# Patient Record
Sex: Male | Born: 1935
Health system: Southern US, Community
[De-identification: ages and names within clinical notes are randomized; demographics above are authoritative.]

## PROBLEM LIST (undated history)

## (undated) DIAGNOSIS — M1990 Unspecified osteoarthritis, unspecified site: Secondary | ICD-10-CM

## (undated) DIAGNOSIS — J189 Pneumonia, unspecified organism: Secondary | ICD-10-CM

## (undated) DIAGNOSIS — C801 Malignant (primary) neoplasm, unspecified: Secondary | ICD-10-CM

## (undated) DIAGNOSIS — Z87442 Personal history of urinary calculi: Secondary | ICD-10-CM

## (undated) DIAGNOSIS — I10 Essential (primary) hypertension: Secondary | ICD-10-CM

## (undated) DIAGNOSIS — Z860100 Personal history of colon polyps, unspecified: Secondary | ICD-10-CM

## (undated) HISTORY — PX: HERNIA REPAIR: SHX51

## (undated) HISTORY — PX: CATARACT EXTRACTION: SUR2

## (undated) HISTORY — DX: Essential (primary) hypertension: I10

## (undated) HISTORY — PX: COLONOSCOPY: SHX174

## (undated) HISTORY — PX: ROTATOR CUFF REPAIR: SHX139

## (undated) HISTORY — DX: Malignant (primary) neoplasm, unspecified: C80.1

## (undated) HISTORY — PX: PATELLA FRACTURE SURGERY: SHX735

## (undated) HISTORY — PX: EYE SURGERY: SHX253

## (undated) HISTORY — DX: Personal history of colon polyps, unspecified: Z86.0100

## (undated) HISTORY — DX: Personal history of colonic polyps: Z86.010

## (undated) NOTE — *Deleted (*Deleted)
HEMATOLOGY/ONCOLOGY CLINIC NOTE  Date of Service: 02/24/2020  Patient Care Team: Ronnald Nian, MD as PCP - General (Family Medicine)  CHIEF COMPLAINTS/PURPOSE OF CONSULTATION:  Diffuse Large B-Cell Lymphoma  HISTORY OF PRESENTING ILLNESS:   Mario Garcia is a wonderful 74 y.o. male who has been referred to [place] by Dr. Sharlot Gowda for evaluation and management of Diffuse Large B-Cell Lymphoma. The pt reports that he is doing well overall.   The pt reports that he feels that he has been a little more tired recently. He first noticed some "nodules" on his neck appear "4-6 weeks ago." He notes that these nodules haven't been painful. He appears to have presented to care with his PCP on 05/28/18, Dr. Susann Givens. He notes that he has noticed that he has been sweating more in the last year. He denies noticing any other new lumps or bumps. He denies any fevers, chills, drenching night sweats, or unexpected weight loss. He notes that he has had some changes in swallowing over the last 6 months, which he characterizes as "getting strangled on his saliva every now and then." He denies abdominal pains, acid reflux, changes in bowel habits, leg swelling, skin rashes. He endorses a deep pain "every once in a while," in his left groin. He denies headaches. He endorses glaucoma and a history of cataracts. He sees Dr. Delrae Sawyers in ophthalmology. He denies any other concern in the last 6 months.  The pt notes that he lives independently with his wife and still is able to do all he wants to do. He walks on trails with his wife and notes that he can generally walk as far as he likes to.  The pt notes that he had prostate cancer in 2008 or 2009, s/p prostatectomy. He did not require RT nor systemic treatments. He denies lung, heart or kidney problems.  Of note prior to the patient's visit today, pt has had a CT Neck completed on 06/05/18 with results revealing Pathologic RIGHT-sided level II, III, IV, and V  lymphadenopathy, most consistent with metastatic carcinoma. An obvious primary source is not identified. Tissue sampling is warranted.  Most recent lab results (07/08/18) of CBC and CMP is as follows: all values are WNL except for PLT at 129k.  On review of systems, pt reports slightly more tired, neck nodules, some changes in swallowing, staying active, and denies fevers, chills, drenching night sweats, unexpected weight loss, abdominal pains, changes in bowel habits, skin rashes, leg swelling, CP, SOB, difficulty breathing, headaches, other lumps or bumps, skin lesions, mouth sores, pain along the spine, back pain, leg swelling, and any other symptoms.   On PMHx the pt reports localized Prostate cancer in 2008 s/p prostatectomy.  On Social Hx the pt reports that he quit smoking over 50 years ago. He notes that he consumes about 1 glass of wine every day, without concerns for excessive drinking. 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 He denies concern for chemical or radiation exposure. On Family Hx the pt reports wife with Stage IV Non Hodgkin's Lymphoma, paternal grandmother with leukemia in her 74s, father with unspecified abdominal cancer.   Interval History:  [name] returns today for management and evaluation of his Diffuse Large B-Cell Lymphoma. We are joined today by his wife.*** The patient's last visit with [place] was on 02/12/2020. The pt reports that he is doing well overall.  The pt reports ***  Of note since  the patient's last visit, pt has had *** completed on *** with results revealing ***.  Lab results today (02/24/20) of CBC w/diff and CMP is as follows: all values are WNL except for ***.  On review of systems, pt reports *** and denies ***and any other symptoms.   A&P: -Discussed pt labwork today, 02/24/20; *** -***   MEDICAL HISTORY:  Past Medical History:  Diagnosis Date  . Cancer (HCC)    PROSTATE  . History of colonic polyps    . History of kidney stones   . Hypertension   . Inguinal hernia 03/2002  . Pneumonia    "years ago"    SURGICAL HISTORY: Past Surgical History:  Procedure Laterality Date  . COLONOSCOPY    . DIRECT LARYNGOSCOPY N/A 07/08/2018   Procedure: DIRECT LARYNGOSCOPY;  Surgeon: Newman Pies, MD;  Location: [facility] OR;  Service: ENT;  Laterality: N/A;  . ESOPHAGOSCOPY N/A 07/08/2018   Procedure: ESOPHAGOSCOPY;  Surgeon: Newman Pies, MD;  Location: [facility] OR;  Service: ENT;  Laterality: N/A;  . FLEXIBLE BRONCHOSCOPY N/A 07/08/2018   Procedure: FLEXIBLE BRONCHOSCOPY;  Surgeon: Newman Pies, MD;  Location: [facility] OR;  Service: ENT;  Laterality: N/A;  . HERNIA REPAIR Left    inguinal hernia  . IR IMAGING GUIDED PORT INSERTION  07/30/2018  . MASS BIOPSY Right 07/08/2018   Procedure: RIGHT NECK MASS EXCISIONAL BIOPSY;  Surgeon: Newman Pies, MD;  Location: [facility] OR;  Service: ENT;  Laterality: Right;  . PATELLA FRACTURE SURGERY Left   . PROSTATE SURGERY  10/2005   RADIAL PROSTATECTOMY  . ROTATOR CUFF REPAIR Bilateral     SOCIAL HISTORY: Social History   Socioeconomic History  . Marital status: Married    Spouse name: Not on file  . Number of children: Not on file  . Years of education: Not on file  . Highest education level: Not on file  Occupational History  . Not on file  Tobacco Use  . Smoking status: Former Games developer  . Smokeless tobacco: Never Used  . Tobacco comment: stopped 60 years ago  Vaping Use  . Vaping Use: Never used  Substance and Sexual Activity  . Alcohol use: Yes    Alcohol/week: 6.0 standard drinks    Types: 6 Standard drinks or equivalent per week  . Drug use: No  . Sexual activity: Not Currently  Other Topics Concern  . Not on file  Social History Narrative  . Not on file   Social Determinants of Health   Financial Resource Strain:   . Difficulty of Paying Living Expenses: Not on file  Food Insecurity:   . Worried About Programme researcher, broadcasting/film/video in the Last Year: Not on file  . Ran Out of Food  in the Last Year: Not on file  Transportation Needs:   . Lack of Transportation (Medical): Not on file  . Lack of Transportation (Non-Medical): Not on file  Physical Activity:   . Days of Exercise per Week: Not on file  . Minutes of Exercise per Session: Not on file  Stress:   . Feeling of Stress : Not on file  Social Connections:   . Frequency of Communication with Friends and Family: Not on file  . Frequency of Social Gatherings with Friends and Family: Not on file  . Attends Religious Services: Not on file  . Active Member of Clubs or Organizations: Not on file  . Attends Banker Meetings: Not on file  . Marital Status: Not on file  Intimate  Partner Violence:   . Fear of Current or Ex-Partner: Not on file  . Emotionally Abused: Not on file  . Physically Abused: Not on file  . Sexually Abused: Not on file    FAMILY HISTORY: Family History  Problem Relation Age of Onset  . Cancer Father   . Cancer Brother     ALLERGIES:  is allergic to lisinopril and penicillins.  MEDICATIONS:  Current Outpatient Medications  Medication Sig Dispense Refill  . Carboxymethylcellulose Sodium (ARTIFICIAL TEARS OP) Place 1 drop into both eyes daily as needed (dry eyes).    Marland Kitchen latanoprost (XALATAN) 0.005 % ophthalmic solution Place 1 drop into both eyes at bedtime.    . lidocaine-prilocaine (EMLA) cream Apply to affected area once 30 g 3  . losartan-hydrochlorothiazide (HYZAAR) 50-12.5 MG tablet TAKE 1 TABLET BY MOUTH EVERY DAY 90 tablet 0  . pravastatin (PRAVACHOL) 40 MG tablet Take 1 tablet by mouth once daily 90 tablet 0   No current facility-administered medications for this visit.    REVIEW OF SYSTEMS:   A 10+ POINT REVIEW OF SYSTEMS WAS OBTAINED including neurology, dermatology, psychiatry, cardiac, respiratory, lymph, extremities, GI, GU, Musculoskeletal, constitutional, breasts, reproductive, HEENT.  All pertinent positives are noted in the HPI.  All others are negative.    PHYSICAL EXAMINATION: ECOG FS:1 - Symptomatic but completely ambulatory  There were no vitals filed for this visit. Wt Readings from Last 3 Encounters:  02/12/20 172 lb (78 kg)  12/11/19 169 lb 11.2 oz (77 kg)  09/11/19 172 lb 8 oz (78.2 kg)   There is no height or weight on file to calculate BMI.    *** GENERAL:alert, in no acute distress and comfortable SKIN: no acute rashes, no significant lesions EYES: conjunctiva are pink and non-injected, sclera anicteric OROPHARYNX: MMM, no exudates, no oropharyngeal erythema or ulceration NECK: supple, no JVD LYMPH:  no palpable lymphadenopathy in the cervical, axillary or inguinal regions ***Multiple, small cervical lymph nodes. LUNGS: clear to auscultation b/l with normal respiratory effort HEART: regular rate & rhythm ABDOMEN:  normoactive bowel sounds , non tender, not distended. No palpable hepatosplenomegaly.  Extremity: no pedal edema PSYCH: alert & oriented x 3 with fluent speech NEURO: no focal motor/sensory deficits  LABORATORY DATA:  I have reviewed the data as listed  . CBC Latest Ref Rng & Units 02/23/2020 02/12/2020 12/11/2019  WBC 4.0 - 10.5 K/uL 8.8 8.0 6.7  Hemoglobin 13.0 - 17.0 g/dL 40.9 81.1 91.4  Hematocrit 39 - 52 % 42.0 40.4 41.7  Platelets 150 - 400 K/uL 126(L) 122(L) 115(L)    . CMP Latest Ref Rng & Units 02/12/2020 12/11/2019 09/11/2019  Glucose 70 - 99 mg/dL 782(N) 81 88  BUN 8 - 23 mg/dL 14 15 15   Creatinine 0.61 - 1.24 mg/dL 5.62 1.30 8.65  Sodium 135 - 145 mmol/L 141 138 140  Potassium 3.5 - 5.1 mmol/L 3.7 3.9 3.5  Chloride 98 - 111 mmol/L 102 102 104  CO2 22 - 32 mmol/L 28 30 24   Calcium 8.9 - 10.3 mg/dL 7.8(I) 9.0 6.9(G)  Total Protein 6.5 - 8.1 g/dL 7.0 7.2 7.3  Total Bilirubin 0.3 - 1.2 mg/dL 0.5 0.6 0.6  Alkaline Phos 38 - 126 U/L 70 64 67  AST 15 - 41 U/L 18 20 25   ALT 0 - 44 U/L 12 17 18    . Lab Results  Component Value Date   LDH 169 02/12/2020   03/03/2019 NM PET Image Restag (PS)  Skull Base To Thigh (  Accession [id number]):   07/08/18 Right Cervical Tissue Biopsy:    RADIOGRAPHIC STUDIES: I have personally reviewed the radiological images as listed and agreed with the findings in the report. NM PET Image Restag (PS) Skull Base To Thigh  Result Date: 02/09/2020 CLINICAL DATA:  Subsequent treatment strategy for large B-cell lymphoma. EXAM: NUCLEAR MEDICINE PET SKULL BASE TO THIGH TECHNIQUE: 8.4 mCi F-18 FDG was injected intravenously. Full-ring PET imaging was performed from the skull base to thigh after the radiotracer. CT data was obtained and used for attenuation correction and anatomic localization. Fasting blood glucose: 99 mg/dl COMPARISON:  82/95/6213. FINDINGS: Mediastinal blood pool activity: SUV max 2.1 Liver activity: SUV max 3.6 NECK: Stable right neck lymph nodes with an index 8 mm level 2 node (4/28), SUV max 6.3. No new hypermetabolic lymph nodes. Incidental CT findings: None. CHEST: Bilateral axillary lymph nodes are stable in size but increased in metabolism. Index high left axillary lymph node measures 7 mm (4/46) with an SUV max of 7.3 compared to 3.2 previously. New hypermetabolism within supraclavicular lymph nodes. Index left supraclavicular lymph node measures 5 mm (4/43) with an SUV max of 4.8. Hypermetabolic internal mammary and mediastinal/hilar lymph nodes. Index prevascular lymph node measures 7 mm (4/63) with an SUV max of 3.0, compared to 1.9 previously. No hypermetabolic pulmonary nodules. Incidental CT findings: Left IJ Port-A-Cath terminates in the high right atrium. Atherosclerotic calcification of the aorta, aortic valve and coronary arteries. Heart is enlarged. No pericardial effusion. A few scattered subpleural pulmonary nodules measure up to 5 mm in the right lower lobe (8/48), unchanged and too small for PET resolution. No pleural effusion. ABDOMEN/PELVIS: Focal hypermetabolism is seen in the liver, adjacent to the gallbladder fossa, without a  definite CT correlate. Spleen is hypermetabolic, SUV max 9.5. No abnormal hypermetabolism in the adrenal glands or pancreas. Periportal lymph nodes measure up to 9 mm (4/109) with an SUV max 7.0, compared to 3.9 previously. Abdominal and pelvic retroperitoneal lymph nodes have increased in hypermetabolism. Index aortocaval lymph node measures 6 mm (4/124) with an SUV max of 7.4 previously 2.1. Index left external iliac lymph node measures 9 mm (4/173) with an SUV max of 8.4, compared to 6.2 previously. Finally, there are hypermetabolic mesenteric lymph nodes. Index ileocolic mesenteric lymph node measures 7 mm (4/142) with an SUV max of 5.9. Incidental CT findings: Liver, gallbladder, adrenal glands, kidneys, spleen, pancreas stomach and bowel are grossly unremarkable. Atherosclerotic calcification of the aorta without aneurysm. Right inguinal hernia contains unobstructed small bowel. SKELETON: No abnormal hypermetabolism. Incidental CT findings: Degenerative changes in the spine. IMPRESSION: 1. Increasingly hypermetabolic lymph nodes in the neck, chest, abdomen and pelvis and newly hypermetabolic spleen findings consistent with worsening lymphoma. 2. Focal hypermetabolism in segment 4 of the liver without a definite CT correlate. Recommend attention on follow-up. 3. Large right inguinal hernia contains unobstructed small bowel. 4. Aortic atherosclerosis (ICD10-I70.0). Coronary artery calcification. Electronically Signed   By: Leanna Battles M.D.   On: 02/09/2020 09:03   [place] CORE BIOPSY (LYMPH NODES)  Result Date: 02/23/2020 INDICATION: History of diffuse large B-cell lymphoma now with hypermetabolic adenopathy. Please perform ultrasound-guided biopsy of hypermetabolic right cervical lymph node for tissue diagnostic purposes. EXAM: ULTRASOUND-GUIDED RIGHT CERVICAL LYMPH NODE BIOPSY COMPARISON:  PET-CT-02/09/2020 MEDICATIONS: None ANESTHESIA/SEDATION: Moderate (conscious) sedation was employed during this  procedure. A total of Versed 1.5 mg and Fentanyl 20 mcg was administered intravenously. Moderate Sedation Time: 10 minutes. The patient's level of consciousness and vital signs were monitored  continuously by radiology nursing throughout the procedure under my direct supervision. COMPLICATIONS: None immediate. TECHNIQUE: Informed written consent was obtained from the patient after a discussion of the risks, benefits and alternatives to treatment. Questions regarding the procedure were encouraged and answered. Initial ultrasound scanning demonstrated an approximately 1.1 x 0.7 cm right lateral cervical lymph node, likely correlating with the hypermetabolic cervical lymph node seen on preceding PET-CT image 30, series 4. An ultrasound image was saved for documentation purposes. The procedure was planned. A timeout was performed prior to the initiation of the procedure. The operative was prepped and draped in the usual sterile fashion, and a sterile drape was applied covering the operative field. A timeout was performed prior to the initiation of the procedure. Local anesthesia was provided with 1% lidocaine with epinephrine. Under direct ultrasound guidance, an 18 gauge core needle device was utilized to obtain to obtain 6 core needle biopsies of the hypermetabolic right lateral cervical lymph node. The samples were placed in saline and submitted to pathology. The needle was removed and hemostasis was achieved with manual compression. Post procedure scan was negative for significant hematoma. A dressing was placed. The patient tolerated the procedure well without immediate postprocedural complication. IMPRESSION: Technically successful ultrasound guided biopsy of hypermetabolic right lateral cervical lymph node. Electronically Signed   By: Simonne Come M.D.   On: 02/23/2020 14:59    ASSESSMENT & PLAN:   18 y.o. male with  1. Diffuse Large B-Cell Lymphoma, Stage IV Presenting without constitutional symptoms.  Palpable right cervical and supraclavicular lymphadenopathy.   07/08/18 Right cervical soft tissue biopsy revealed Diffuse Large B-Cell Lymphoma, germinal center type   06/05/18 CT Neck revealed Pathologic RIGHT-sided level II, III, IV, and V lymphadenopathy, most consistent with metastatic carcinoma. An obvious primary source is not identified. Tissue sampling is warranted.  07/16/18 Hep B and Hep C negative  07/17/18 ECHO revealed LV EF of 60-65%  07/22/18 PET/CT revealed "Hypermetabolic adenopathy especially concentrated in the right neck but also in the left neck, chest, abdomen, and pelvis. The adenopathy is primarily Deauville 5 although some few scattered smaller lesions are Deauville 4. 2. Diffuse abnormal splenic activity, Deauville 5, without overt splenomegaly. 3. There is also hypermetabolic Deauville 5 activity in the mildly thickened distal appendix, and raising suspicion for involvement of the appendix. 4. Other imaging findings of potential clinical significance: Aortic Atherosclerosis. Coronary atherosclerosis."  10/03/2018 PET scan revealed "Interval response to therapy with stable to decreased size of lymph nodes on CT and generalized decrease in hypermetabolism of the abnormal nodes. Hypermetabolism in the lymph nodes today is compatible with a combination of Deauville 3 and Deauville 4 disease. Stable tiny bilateral pulmonary nodules. Increase radiotracer accumulation in the marrow space on today's study, presumably representing marrow stimulatory effects of therapy."   02/09/2020 PET/CT ([id number]) revealed "1. Increasingly hypermetabolic lymph nodes in the neck, chest, abdomen and pelvis and newly hypermetabolic spleen findings consistent with worsening lymphoma."   PLAN: *** -Advised pt that we would not expect symptoms to be caused by lymphoma based on apparent burden of disease.  -Advised pt that if lymphadenopathy is caused by a reactive process it would not be a  contraindication to a hernia repair surgery.    FOLLOW UP: ***   The total time spent in the appt was *** minutes and more than 50% was on counseling and direct patient cares.  All of the patient's questions were answered with apparent satisfaction. The patient knows to call the clinic with  any problems, questions or concerns.   Wyvonnia Lora MD MS AAHIVMS [facility] Hematology/Oncology Physician [facility]  (Office):       [phone] (Work cell):  [phone] (Fax):           [phone]  02/24/2020 1:42 PM  I, Carollee Herter, am acting as a scribe for Dr. Wyvonnia Lora.   {Add Production assistant, radio Statement}

---

## 1999-01-25 ENCOUNTER — Encounter: Admission: RE | Admit: 1999-01-25 | Discharge: 1999-01-25 | Payer: Self-pay | Admitting: Orthopaedic Surgery

## 1999-01-28 ENCOUNTER — Ambulatory Visit (HOSPITAL_BASED_OUTPATIENT_CLINIC_OR_DEPARTMENT_OTHER): Admission: RE | Admit: 1999-01-28 | Discharge: 1999-01-28 | Payer: Self-pay | Admitting: Orthopaedic Surgery

## 2000-08-26 ENCOUNTER — Encounter: Payer: Self-pay | Admitting: Emergency Medicine

## 2000-08-26 ENCOUNTER — Emergency Department (HOSPITAL_COMMUNITY): Admission: EM | Admit: 2000-08-26 | Discharge: 2000-08-26 | Payer: Self-pay | Admitting: Emergency Medicine

## 2002-04-14 ENCOUNTER — Encounter: Payer: Self-pay | Admitting: Family Medicine

## 2002-04-14 ENCOUNTER — Encounter: Admission: RE | Admit: 2002-04-14 | Discharge: 2002-04-14 | Payer: Self-pay | Admitting: Family Medicine

## 2002-05-14 ENCOUNTER — Encounter: Payer: Self-pay | Admitting: General Surgery

## 2002-05-14 ENCOUNTER — Encounter: Admission: RE | Admit: 2002-05-14 | Discharge: 2002-05-14 | Payer: Self-pay | Admitting: General Surgery

## 2004-12-11 ENCOUNTER — Encounter: Admission: RE | Admit: 2004-12-11 | Discharge: 2004-12-11 | Payer: Self-pay | Admitting: Family Medicine

## 2005-08-17 ENCOUNTER — Encounter: Admission: RE | Admit: 2005-08-17 | Discharge: 2005-08-17 | Payer: Self-pay | Admitting: Family Medicine

## 2005-09-21 LAB — HM COLONOSCOPY

## 2005-10-04 ENCOUNTER — Ambulatory Visit: Payer: Self-pay | Admitting: Family Medicine

## 2005-10-25 HISTORY — PX: PROSTATE SURGERY: SHX751

## 2005-11-23 ENCOUNTER — Encounter (INDEPENDENT_AMBULATORY_CARE_PROVIDER_SITE_OTHER): Payer: Self-pay | Admitting: Specialist

## 2005-11-23 ENCOUNTER — Inpatient Hospital Stay (HOSPITAL_COMMUNITY): Admission: RE | Admit: 2005-11-23 | Discharge: 2005-11-24 | Payer: Self-pay | Admitting: Urology

## 2005-12-01 ENCOUNTER — Emergency Department (HOSPITAL_COMMUNITY): Admission: EM | Admit: 2005-12-01 | Discharge: 2005-12-01 | Payer: Self-pay | Admitting: Cardiology

## 2005-12-27 ENCOUNTER — Ambulatory Visit: Payer: Self-pay | Admitting: Family Medicine

## 2006-04-18 ENCOUNTER — Ambulatory Visit: Payer: Self-pay | Admitting: Family Medicine

## 2006-10-04 ENCOUNTER — Ambulatory Visit: Payer: Self-pay | Admitting: Family Medicine

## 2007-09-30 ENCOUNTER — Ambulatory Visit: Payer: Self-pay | Admitting: Family Medicine

## 2008-04-16 ENCOUNTER — Ambulatory Visit: Payer: Self-pay | Admitting: Family Medicine

## 2008-06-23 ENCOUNTER — Ambulatory Visit: Payer: Self-pay | Admitting: Family Medicine

## 2009-12-22 ENCOUNTER — Ambulatory Visit: Payer: Self-pay | Admitting: Family Medicine

## 2010-01-21 ENCOUNTER — Ambulatory Visit: Payer: Self-pay | Admitting: Family Medicine

## 2010-08-12 NOTE — Consult Note (Signed)
NAME:  Mario Garcia, Mario Garcia NO.:  [id number]   MEDICAL RECORD NO.:  [id number]          PATIENT TYPE:  EMS   LOCATION:  ED                           FACILITY:  [facility]   PHYSICIAN:  Sigmund I. Patsi Sears, M.D.DATE OF BIRTH:  [birth year]   DATE OF CONSULTATION:  12/01/2005  DATE OF DISCHARGE:                                   CONSULTATION   SUBJECTIVE:  This is a 77 year old male, who is 10 days status post robotic  radical prostatectomy for adenocarcinoma of the prostate.  The patient had  Foley catheter removed yesterday in the office, and voided one time normally  after that.  However, the patient began having bloody urine last night and  decrease in urine ability.  By early this morning, the patient was in  urinary retention, was seen in the emergency room, where a Foley catheter  was passed without difficulty, and grossly bloody urine identified.  He had  urinalysis which showed occasional white blood cell, and too numerous to  count red blood cells.  PMH: significant for recemt robotic prostate surgery.  ROS: Freqency, urgency, gross hematuris, suprapubic pain, with abominal  cramping. Remaining ROS is neg.  Social: Tobacco: neg. Etoh: neg.   MEDICATIONS:  Antibiotic (unknown).   PHYSICAL EXAMINATION:  Today, shows a well-developed, well-nourished white  male in no acute distress.  VITAL SIGNS:  Stable.  CHEST:  Clear to P&A.  ABDOMEN:  Soft, positive bowel sounds without organomegaly or masses.  There  is no flank pain.  There is no CVA pain.  The Foley catheter is in good  position.  PENIS:  Normal.  URETHRA:  Normal.  GLANS:  Normal.  TESTICLES:  Measure 4 x 4 cm and nontender.  ABDOMINAL INCISIONS:  Wound healing nicely.  Foley catheter is in position,  and there is grossly bloody urine, with occasional clot.  The urine in the  tube appears to be clearing.  EXTREMITIES:  No cyanosis or edema.  There is negative Homan's sign.   IMPRESSION:  Postoperative  urinary clot retention.  I have advised the  patient to keep the Foley catheter for the weekend, and he will see Dr.  Laverle Patter on Monday for voiding trial.  I have reassured him.  We will keep him  on Cipro for the weekend.   PLAN:  1. Write Cipro 500 mg b.i.d. for 3 days.  2. Foley catheter will remain in place with leg bag and Foley catheter.  3. Return to clinic Monday to see Dr. Laverle Patter.      Sigmund I. Patsi Sears, M.D.  Electronically Signed     SIT/MEDQ  D:  12/01/2005  T:  12/01/2005  Job:  [id number]   cc:   Heloise Purpura, MD  Fax: [phone]

## 2010-08-12 NOTE — Discharge Summary (Signed)
NAME:  Mario Garcia, Mario Garcia NO.:  [id number]   MEDICAL RECORD NO.:  [id number]          PATIENT TYPE:  INP   LOCATION:  1418                         FACILITY:  [facility]   PHYSICIAN:  Heloise Purpura, MD      DATE OF BIRTH:  1931/06/07   DATE OF ADMISSION:  11/23/2005  DATE OF DISCHARGE:  11/24/2005                                 DISCHARGE SUMMARY   ADMISSION DIAGNOSIS:  Prostate cancer.   DISCHARGE DIAGNOSIS:  Prostate cancer.   PROCEDURES:  1. Robotic-assisted laparoscopic radical prostatectomy.  2. Bilateral pelvic lymphadenectomy.   HISTORY AND PHYSICAL:  For full details, please see admission history  physical.  Briefly, [name] is a 77 year old gentleman with clinical  stage T1c prostate cancer with a PSA of 11.3 and Gleason score 3 + 3 = 6.  After discussion regarding management options for clinically localized  prostate cancer, the patient elected to proceed with surgical therapy with  the above procedure.   HOSPITAL COURSE:  On November 23, 2005, the patient was taken to the operating  room and a robotic-assisted laparoscopic radical prostatectomy was performed  with a bilateral pelvic lymphadenectomy.  The patient tolerated the  procedure well without complications.  Postoperatively, he was able to be  transferred to a regular hospital room following recovery from anesthesia.  He was able to begin ambulating that evening.  On postoperative day #1, he was doing well and was able to begin a clear  liquid diet.  He was noted to be mildly hypotensive with a systolic blood  pressure in the 90s.  In addition, his hemoglobin did drop from  approximately 14 to 10.8.  He was monitored closely and a repeat hemoglobin  was checked and it was 11.  In addition, his blood pressure subsequently  improved with systolic pressures in the 140s to 150s.  He maintained  excellent urine output throughout postoperative day #1 with minimal output  from his pelvic drain.  His  pelvic drain was therefore able to be removed.  He tolerated clear liquids without difficulty and was able to be  transitioned to oral pain medication.  By the afternoon of postoperative day  #1, he was doing well and had met all discharge criteria and was able to be  discharged home in excellent condition.   DISPOSITION:  Home.   DISCHARGE MEDICATIONS:  The patient was instructed to resume his regular  home medications excepting any aspirin, nonsteroidal anti-inflammatory  drugs, or herbal supplements.  He was given a prescription to take Vicodin  as needed for pain, Colace as a stool softener, and told to begin Cipro 1  day prior to his return visit for Foley catheter removal.   DISCHARGE INSTRUCTIONS:  1. The patient was instructed to be ambulatory but specifically told to      refrain from any heavy lifting, strenuous activity, or driving.  2. He was told to gradually advance his diet as tolerated once passing      flatus.  3. He was instructed on routine Foley catheter care and given a leg bag  for daytime usage.   FOLLOW UP:  Mr. Hernan will follow up in 1 week for removal of his Foley  catheter and to review his surgical pathology in detail.           ______________________________  Heloise Purpura, MD  Electronically Signed     LB/MEDQ  D:  11/24/2005  T:  11/25/2005  Job:  [id number]   cc:   Sharlot Gowda, M.D.  Fax: [phone]

## 2010-08-12 NOTE — Op Note (Signed)
NAME:  ARRION, BROADDUS NO.:  [id number]   MEDICAL RECORD NO.:  [id number]          PATIENT TYPE:  INP   LOCATION:  0001                         FACILITY:  [facility]   PHYSICIAN:  Heloise Purpura, MD      DATE OF BIRTH:  1931/07/28   DATE OF PROCEDURE:  11/23/2005  DATE OF DISCHARGE:                                 OPERATIVE REPORT   PREOPERATIVE DIAGNOSIS:  Clinically localized adenocarcinoma of the  prostate.   POSTOPERATIVE DIAGNOSIS:  Clinically localized adenocarcinoma of the  prostate.   PROCEDURES.:  1. Robotic assisted laparoscopic radical prostatectomy (bilateral nerve      sparing).  2. Bilateral laparoscopic pelvic lymphadenectomy.   SURGEON:  Dr. Heloise Purpura.   ASSISTANT:  Dr. [place] Service.   ANESTHESIA:  General.   COMPLICATIONS:  None.   ESTIMATED BLOOD LOSS:  200 mL.   INTRAVENOUS FLUIDS:  1600 mL of lactated Ringer's.   SPECIMENS:  1. Prostate and seminal vesicles.  2. Right pelvic lymph nodes.  3. Left pelvic lymph nodes.   DRAINS:  1. 20-French straight catheter.  2. #19 Blake pelvic drain.   INDICATIONS:  Mr. Carrero is a 77 year old gentleman with clinical stage T1C  prostate cancer, with a PSA of 11.3 and Gleason score 3 + 3 = 6.  After  discussing management options, including active surveillance, the patient  elected to proceed with surgical therapy.  He underwent a preoperative  evaluation and was felt to be in excellent health at this time.  Potential  risks and benefits of the procedure were discussed with the patient and he  consented.   DESCRIPTION OF PROCEDURE:  The patient was taken to the operating room and  general anesthetic was administered.  He was given preoperative antibiotics,  placed in the dorsal lithotomy position, and prepped and draped in the usual  sterile fashion.  Next, preoperative time-out was performed.  A Foley  catheter was then inserted into the bladder.  A site was selected 18 cm from  the  pubic symphysis and just to the left of the umbilicus for placement of  the camera port.  This was placed using a standard open Hasson technique.  This allowed entry into the peritoneal cavity under direct vision.  A 12-mm  port was then placed and pneumoperitoneum was established.  With the 0-  degree lens, the abdomen was inspected and there was no evidence of any  intra-abdominal injuries or other abnormalities.  The remaining ports were  then placed.  Bilateral 8-mm robotic ports were placed 10 cm lateral to the  camera port and 16 cm from the pubic symphysis.  An additional 8-mm port was  placed in the far left lateral abdominal wall.  A 5-mm port was placed  between the camera port and the right robotic port.  An additional 12-mm  port was placed in the far right lateral abdominal wall for laparoscopic  assistance.  All ports were placed under direct vision and without  difficulty.  The surgical cart was then docked.  With the aid of the cautery  scissors,  the bladder was reflected posteriorly, allowing entrance into the  space of Retzius and identification of the endopelvic fascia and prostate.  The endopelvic fascia was then incised from the apex back to the base of the  prostate bilaterally, and the underlying levator muscle fibers were swept  laterally off the prostate.  This isolated the dorsal venous complex which  was then stapled and divided with a 45-mm flex ETS stapler.  Attention then  turned to the bladder neck, which was identified with the aid of Foley  catheter manipulation.  The bladder neck was then incised anteriorly,  allowing exposure of the Foley catheter.  The Foley catheter balloon was  deflated and the catheter was brought into the operative field and used to  retract the prostate anteriorly.  This helped to expose the posterior  bladder neck which was then divided.  Dissection continued posteriorly until  the vasa differentia and seminal vesicles were  identified.  The vasa  differentia were isolated and divided.  The seminal vesicles were similarly  isolated with care to control the seminal vesicle arterial blood supply.  Seminal vesicles were then also lifted anteriorly.  The space between  Denonvilliers' fascia and the anterior rectum was then bluntly developed,  thereby isolating the vascular pedicles of the prostate.  Attention then  turned to the anterior aspect of the prostate.  The lateral prostatic fascia  was incised bilaterally, allowing the neurovascular bundles to be swept  laterally and posteriorly off the prostate.  The vascular pedicles of the  prostate were then ligated with Hem-o-lok above the level of the  neurovascular bundles, and sharply divided with cold dissection.  The  urethra was then identified and sharply divided.  This allowed the prostate  specimen to be disarticulated and placed up into the abdomen for later  removal.  The pelvis was then copiously irrigated and hemostasis was  ensured.  There was noted to be a small area of oozing from the left  neurovascular bundle.  A small piece of Surgicel was placed over this area.  Attention then turned to the right pelvic sidewall.  The fibrofatty tissue  between the external iliac vein, confluence of the iliac vessels, obturator  nerve, and Cooper's ligament was dissected free from the pelvic sidewall.  Hem-o-lok clips were used for lymphostasis and hemostasis.  The specimen was  then passed off for permanent pathologic analysis.  An identical procedure  was then performed on contralateral side.  Attention then returned to the  pelvis.  With irrigation in the pelvis, air was injected into the rectal  catheter and there was no evidence of a rectal injury.  Attention then  turned to the urethral anastomosis.  A double-armed, 3-0 Monocryl suture was  used to perform a 360 degrees running tension-free anastomosis between the bladder neck and urethra.  A new 20-French  Coude catheter was then inserted  into the bladder.  This catheter was irrigated and there were no blood clots  within the catheter.  In addition, the catheter was irrigated and the  anastomosis appeared to be watertight.  There was still noted be a small  amount of bleeding from the left neurovascular bundle.  Therefore, a figure-  of-eight 0 Vicryl suture was used to provide hemostasis.  This did appear to  result in adequate hemostasis.  A #19 Blake drain was then brought to the  left robotic port and appropriately positioned in the pelvis.  It was  secured to the skin with a  nylon suture.  The surgical cart was then  undocked.  The Endopouch retrieval bag was then used to retrieve the  prostate specimen.  A 0 Vicryl stitch was used to close the right lateral 12-  mm port site with the aid of the suture passer device.  All remaining ports  were then removed under direct vision.  The prostate specimen then was  removed intact, within the Endopouch retrieval bag, via the periumbilical  port site.  This fascial opening was then closed with a running 0 Vicryl  suture.  All port sites  were injected with 0.25% Marcaine and reapproximated at the skin level with  staples.  The patient appeared to tolerate the procedure well and without  complications.  He was able to be extubated and transferred to recovery unit  in satisfactory condition.           ______________________________  Heloise Purpura, MD  Electronically Signed     LB/MEDQ  D:  11/23/2005  T:  11/24/2005  Job:  [id number]

## 2010-10-04 ENCOUNTER — Ambulatory Visit (INDEPENDENT_AMBULATORY_CARE_PROVIDER_SITE_OTHER): Payer: Medicare Other | Admitting: Medical

## 2010-10-04 ENCOUNTER — Encounter: Payer: Self-pay | Admitting: Medical

## 2010-10-04 ENCOUNTER — Encounter: Payer: Self-pay | Admitting: Family Medicine

## 2010-10-04 VITALS — BP 132/78 | HR 60 | Temp 98.6°F | Ht 70.0 in | Wt 162.0 lb

## 2010-10-04 DIAGNOSIS — R05 Cough: Secondary | ICD-10-CM

## 2010-10-04 DIAGNOSIS — I10 Essential (primary) hypertension: Secondary | ICD-10-CM

## 2010-10-04 DIAGNOSIS — R0982 Postnasal drip: Secondary | ICD-10-CM

## 2010-10-04 MED ORDER — LISINOPRIL-HYDROCHLOROTHIAZIDE 10-12.5 MG PO TABS: 1.0000 | ORAL_TABLET | Freq: Every day | ORAL | Status: DC

## 2010-10-04 MED ORDER — HYDROCODONE-HOMATROPINE 5-1.5 MG/5ML PO SYRP: 5.0000 mL | ORAL_SOLUTION | Freq: Every evening | ORAL | Status: AC | PRN

## 2010-10-04 NOTE — Progress Notes (Signed)
Addended by: Dorthula Perfect on: 10/04/2010 02:21 PM   Modules accepted: Orders

## 2010-10-04 NOTE — Progress Notes (Signed)
  Subjective:     Mario Garcia is a 77 y.o. male who presents for evaluation of cough that only bothers him if supine or reclined in the evening.  Sometimes the cough keeps him up at night.  He thinks its related to post nasal drip.  He denies feeling ill, no sick contacts, no reflux symptoms, otherwise in normal state of health. He wants to make sure its not related to meds or something else.  Cough doesn't bother him during the day, and he has been tobacco free for 50+ years.  No other aggravating or relieving factors.  No other c/o.  The following portions of the patient's history were reviewed and updated as appropriate: allergies, current medications, past family history, past medical history, past social history, past surgical history and problem list.  Review of Systems Constitutional: denies fever, chills, sweats, anorexia Skin: denies rash HEENT: denies ear pain, sore throat, itchy watery eyes Cardiovascular: denies chest pain Lungs: denies wheezing, SOB Abdomen: denies abdominal pain, nausea, vomiting, diarrhea GU: denies dysuria  Objective:   Filed Vitals:   10/04/10 1037  BP: 132/78  Pulse: 60  Temp: 98.6 F (37 C)    General appearance: Alert, WD/WN, no distress                             Skin: warm, no rash                           Head: no sinus tenderness                            Eyes: conjunctiva normal, corneas clear, PERRLA                            Ears: pearly TMs, external ear canals normal                          Nose: septum midline, turbinates swollen with clear nasal discharge             Mouth/throat: MMM, tongue normal, +post nasal drip, but no pharyngeal erythema                           Neck: supple, no adenopathy, no thyromegaly, nontender                          Heart: RRR, normal S1, S2, no murmurs                         Lungs: CTA bilaterally, no wheezes, rales, or rhonchi     Assessment:   Encounter Diagnoses  Name Primary?  .  Post-nasal drip Yes  . Cough   . Essential hypertension, benign      Plan:   Advised he begin over-the-counter Zyrtec each bedtime, Hydromet for cough prn, hydrate well, call if not improved in 1-2 wk.  HTN - refilled medication.

## 2010-12-15 ENCOUNTER — Telehealth: Payer: Self-pay | Admitting: Family Medicine

## 2010-12-15 MED ORDER — PRAVASTATIN SODIUM 40 MG PO TABS: 40.0000 mg | ORAL_TABLET | Freq: Every day | ORAL | Status: DC

## 2010-12-15 NOTE — Telephone Encounter (Signed)
He needs a med check appointment set up. Don't let them run out of his statin drug. If he needs more cough medication, he will need to be seen for that also

## 2010-12-15 NOTE — Telephone Encounter (Signed)
Left pt message he needs a med check appt

## 2010-12-21 ENCOUNTER — Ambulatory Visit (INDEPENDENT_AMBULATORY_CARE_PROVIDER_SITE_OTHER): Payer: Medicare Other | Admitting: Family Medicine

## 2010-12-21 ENCOUNTER — Encounter: Payer: Self-pay | Admitting: Family Medicine

## 2010-12-21 VITALS — BP 140/72 | HR 60 | Ht 70.5 in | Wt 165.0 lb

## 2010-12-21 DIAGNOSIS — Z79899 Other long term (current) drug therapy: Secondary | ICD-10-CM

## 2010-12-21 DIAGNOSIS — Z23 Encounter for immunization: Secondary | ICD-10-CM

## 2010-12-21 DIAGNOSIS — Z8546 Personal history of malignant neoplasm of prostate: Secondary | ICD-10-CM | POA: Insufficient documentation

## 2010-12-21 DIAGNOSIS — E785 Hyperlipidemia, unspecified: Secondary | ICD-10-CM | POA: Insufficient documentation

## 2010-12-21 DIAGNOSIS — K219 Gastro-esophageal reflux disease without esophagitis: Secondary | ICD-10-CM

## 2010-12-21 DIAGNOSIS — I10 Essential (primary) hypertension: Secondary | ICD-10-CM

## 2010-12-21 LAB — COMPREHENSIVE METABOLIC PANEL
ALT: 16 U/L (ref 0–53)
AST: 22 U/L (ref 0–37)
Albumin: 4.4 g/dL (ref 3.5–5.2)
Alkaline Phosphatase: 57 U/L (ref 39–117)
Potassium: 4.3 mEq/L (ref 3.5–5.3)
Sodium: 138 mEq/L (ref 135–145)
Total Protein: 7.5 g/dL (ref 6.0–8.3)

## 2010-12-21 LAB — CBC WITH DIFFERENTIAL/PLATELET
Basophils Relative: 0 % (ref 0–1)
HCT: 45.8 % (ref 39.0–52.0)
Hemoglobin: 15.1 g/dL (ref 13.0–17.0)
Lymphocytes Relative: 40 % (ref 12–46)
Lymphs Abs: 2.2 10*3/uL (ref 0.7–4.0)
MCHC: 33 g/dL (ref 30.0–36.0)
Monocytes Absolute: 0.6 10*3/uL (ref 0.1–1.0)
Monocytes Relative: 11 % (ref 3–12)
Neutro Abs: 2.7 10*3/uL (ref 1.7–7.7)
RBC: 5.15 MIL/uL (ref 4.22–5.81)

## 2010-12-21 LAB — LIPID PANEL
HDL: 41 mg/dL (ref >39)
LDL Cholesterol: 115 mg/dL — ABNORMAL HIGH (ref 0–99)

## 2010-12-21 NOTE — Progress Notes (Signed)
  Subjective:    Patient ID: Mario Garcia, male    DOB: 01-02-1932, 77 y.o.   MRN: [id number]  HPI He is here for an interval evaluation. He continues on medications listed in the chart. He is having no difficulty with them. He gets routine followup concerning his prostate cancer and apparently his most recent PSA was undetectable. He has noted difficulty over the last several months with a cough it tends to occur when he lies down. He has tried an antihistamine for it with minimal results. He does not complain of reflux type symptoms. He has no particular concerns or complaints other than as above. He is getting ready did spend a month in [facility].   Review of Systems     Objective:   Physical Exam alert and in no distress. Tympanic membranes and canals are normal. Throat is clear. Tonsils are normal. Neck is supple without adenopathy or thyromegaly. Cardiac exam shows a regular sinus rhythm without murmurs or gallops. Lungs are clear to auscultation.       Assessment & Plan:   1. GERD (gastroesophageal reflux disease)    2. Hypertension  CBC with Differential, Comprehensive metabolic panel, Lipid panel  3. Hyperlipidemia LDL goal < 100  Lipid panel  4. History of prostate cancer    5. Encounter for long-term (current) use of other medications  CBC with Differential, Comprehensive metabolic panel, Lipid panel   He will try Pepcid or Zantac and double it if no response. He is then to call me. His medications will be renewed pending lab results

## 2010-12-21 NOTE — Patient Instructions (Signed)
Uses Zantac or Pepcid for your coughing. If that doesn't work double it. If it still doesn't work, call me.

## 2010-12-22 ENCOUNTER — Telehealth: Payer: Self-pay

## 2010-12-22 NOTE — Telephone Encounter (Signed)
Pt informed labs look good and to continue on present med

## 2011-01-31 ENCOUNTER — Telehealth: Payer: Self-pay | Admitting: Family Medicine

## 2011-01-31 MED ORDER — PRAVASTATIN SODIUM 40 MG PO TABS: 40.0000 mg | ORAL_TABLET | Freq: Every day | ORAL | Status: DC

## 2011-01-31 NOTE — Telephone Encounter (Signed)
Pravachol sent to new pharmacy

## 2011-02-23 ENCOUNTER — Other Ambulatory Visit: Payer: Self-pay | Admitting: Endodontics

## 2011-10-17 ENCOUNTER — Ambulatory Visit (INDEPENDENT_AMBULATORY_CARE_PROVIDER_SITE_OTHER): Payer: Medicare Other | Admitting: Family Medicine

## 2011-10-17 ENCOUNTER — Encounter: Payer: Self-pay | Admitting: Family Medicine

## 2011-10-17 VITALS — BP 124/80 | HR 65 | Wt 173.0 lb

## 2011-10-17 DIAGNOSIS — K219 Gastro-esophageal reflux disease without esophagitis: Secondary | ICD-10-CM

## 2011-10-17 NOTE — Patient Instructions (Addendum)
Take2 Prilosec per day and you might want take it at night. You can also hold the lisinopril for week or 2. If the cough goes away then add the lisinopril back but if it returns let me know

## 2011-10-17 NOTE — Progress Notes (Signed)
  Subjective:    Patient ID: Mario Garcia, male    DOB: [birth year], 78 y.o.   MRN: [id number]  HPI He is still having difficulty with coughing he notices especially at night and when he lies down. He is concerned over this being related to his lisinopril. He did stop for one or 2 days and notes a questionable improvement. He is also been using an H2 blocker to help with cough and again this has been minimally successful.  Review of Systems     Objective:   Physical Exam Alert and in no distress. Lungs are clear to auscultation cardiac exam shows regular rhythm without murmurs or gallops.       Assessment & Plan:   1. GERD (gastroesophageal reflux disease)   Take2 Prilosec per day and you might want take it at night. You can also hold the lisinopril for week or 2. If the cough goes away then add the lisinopril back but if it returns let me know

## 2011-10-30 ENCOUNTER — Telehealth: Payer: Self-pay | Admitting: Family Medicine

## 2011-10-30 NOTE — Telephone Encounter (Signed)
There is a question as to exactly what causing his cough. I will have him stop lisinopril and Prilosec for a week. If his cough recurs, he is to take 2 Prilosec per day and then call me.

## 2011-11-07 ENCOUNTER — Telehealth: Payer: Self-pay | Admitting: Internal Medicine

## 2011-11-07 NOTE — Telephone Encounter (Signed)
He is to start taking Prilosec 40 mg for the next week and then call me. If his coughing is gone completely I might try him on lisinopril again however if the cough recurs, I will switch him to an ARB.

## 2011-11-14 ENCOUNTER — Telehealth: Payer: Self-pay | Admitting: Internal Medicine

## 2011-11-14 NOTE — Telephone Encounter (Signed)
Pt states that his cough is almost at 100% better and that he will start on his lisinopril tomorrow

## 2011-11-29 ENCOUNTER — Ambulatory Visit (INDEPENDENT_AMBULATORY_CARE_PROVIDER_SITE_OTHER): Payer: Medicare Other | Admitting: Family Medicine

## 2011-11-29 ENCOUNTER — Encounter: Payer: Self-pay | Admitting: Family Medicine

## 2011-11-29 VITALS — BP 130/80 | HR 67 | Wt 169.0 lb

## 2011-11-29 DIAGNOSIS — R05 Cough: Secondary | ICD-10-CM | POA: Diagnosis not present

## 2011-11-29 DIAGNOSIS — I10 Essential (primary) hypertension: Secondary | ICD-10-CM

## 2011-11-29 DIAGNOSIS — Z23 Encounter for immunization: Secondary | ICD-10-CM

## 2011-11-29 DIAGNOSIS — T44905A Adverse effect of unspecified drugs primarily affecting the autonomic nervous system, initial encounter: Secondary | ICD-10-CM

## 2011-11-29 MED ORDER — LOSARTAN POTASSIUM-HCTZ 50-12.5 MG PO TABS: 1.0000 | ORAL_TABLET | Freq: Every day | ORAL | Status: DC

## 2011-11-29 NOTE — Progress Notes (Signed)
  Subjective:    Patient ID: Mario Garcia, male    DOB: 18-Apr-1931, 78 y.o.   MRN: [id number]  HPI He is here for recheck. He stopped his ACE inhibitor in his cough went away. He then started again to make sure that it was truly an ACE cough and his cough returned.   Review of Systems     Objective:   Physical Exam Alert and in no distress otherwise not examined       Assessment & Plan:  I will switch him to Hyzaar. Also discussed the need for a flu shot and will give this to him. Discussed possible side effects. Check here one month 1. ACE-inhibitor cough    2. Hypertension  losartan-hydrochlorothiazide (HYZAAR) 50-12.5 MG per tablet  3. Need for prophylactic vaccination and inoculation against influenza

## 2011-12-25 ENCOUNTER — Ambulatory Visit: Payer: Medicare Other | Admitting: Family Medicine

## 2012-01-30 ENCOUNTER — Telehealth: Payer: Self-pay | Admitting: Internal Medicine

## 2012-01-30 MED ORDER — PRAVASTATIN SODIUM 40 MG PO TABS: 40.0000 mg | ORAL_TABLET | Freq: Every day | ORAL | Status: DC

## 2012-01-30 NOTE — Telephone Encounter (Signed)
Sent med in pt needs med check no lipids done for over a year

## 2012-02-02 DIAGNOSIS — N529 Male erectile dysfunction, unspecified: Secondary | ICD-10-CM | POA: Diagnosis not present

## 2012-02-02 DIAGNOSIS — C61 Malignant neoplasm of prostate: Secondary | ICD-10-CM | POA: Diagnosis not present

## 2012-03-25 ENCOUNTER — Other Ambulatory Visit: Payer: Self-pay | Admitting: Family Medicine

## 2012-03-25 ENCOUNTER — Telehealth: Payer: Self-pay | Admitting: Family Medicine

## 2012-03-25 MED ORDER — PRAVASTATIN SODIUM 40 MG PO TABS: 40.0000 mg | ORAL_TABLET | Freq: Every day | ORAL | Status: DC

## 2012-03-25 NOTE — Telephone Encounter (Signed)
Pt called needs refill for Prevastatin.  I advised pt also needs CPE with labs.  Refilled with 1 refill Prevastating to [facility]

## 2012-04-19 ENCOUNTER — Encounter: Payer: Self-pay | Admitting: Internal Medicine

## 2012-04-22 ENCOUNTER — Telehealth: Payer: Self-pay | Admitting: Family Medicine

## 2012-04-22 DIAGNOSIS — I10 Essential (primary) hypertension: Secondary | ICD-10-CM

## 2012-04-22 MED ORDER — LOSARTAN POTASSIUM-HCTZ 50-12.5 MG PO TABS: 1.0000 | ORAL_TABLET | Freq: Every day | ORAL | Status: DC

## 2012-04-22 NOTE — Telephone Encounter (Signed)
hyzarr sent in

## 2012-04-22 NOTE — Telephone Encounter (Signed)
Pt called and stated that he had an appointment on 2/3 and will not have enough hyzaar until appointment date. Pt uses [facility] on [address]

## 2012-04-29 ENCOUNTER — Encounter: Payer: Self-pay | Admitting: Family Medicine

## 2012-04-29 ENCOUNTER — Ambulatory Visit (INDEPENDENT_AMBULATORY_CARE_PROVIDER_SITE_OTHER): Payer: Medicare Other | Admitting: Family Medicine

## 2012-04-29 VITALS — BP 126/78 | HR 72 | Ht 70.0 in | Wt 172.0 lb

## 2012-04-29 DIAGNOSIS — T44905A Adverse effect of unspecified drugs primarily affecting the autonomic nervous system, initial encounter: Secondary | ICD-10-CM | POA: Diagnosis not present

## 2012-04-29 DIAGNOSIS — I10 Essential (primary) hypertension: Secondary | ICD-10-CM | POA: Diagnosis not present

## 2012-04-29 DIAGNOSIS — E785 Hyperlipidemia, unspecified: Secondary | ICD-10-CM | POA: Diagnosis not present

## 2012-04-29 DIAGNOSIS — Z79899 Other long term (current) drug therapy: Secondary | ICD-10-CM | POA: Diagnosis not present

## 2012-04-29 DIAGNOSIS — R05 Cough: Secondary | ICD-10-CM | POA: Diagnosis not present

## 2012-04-29 DIAGNOSIS — R058 Other specified cough: Secondary | ICD-10-CM

## 2012-04-29 DIAGNOSIS — Z8546 Personal history of malignant neoplasm of prostate: Secondary | ICD-10-CM

## 2012-04-29 LAB — CBC WITH DIFFERENTIAL/PLATELET
Basophils Relative: 0 % (ref 0–1)
Eosinophils Absolute: 0 10*3/uL (ref 0.0–0.7)
Eosinophils Relative: 1 % (ref 0–5)
HCT: 45 % (ref 39.0–52.0)
Hemoglobin: 15.3 g/dL (ref 13.0–17.0)
Lymphs Abs: 2.2 10*3/uL (ref 0.7–4.0)
MCH: 29.1 pg (ref 26.0–34.0)
MCHC: 34 g/dL (ref 30.0–36.0)
MCV: 85.6 fL (ref 78.0–100.0)
Monocytes Absolute: 0.7 10*3/uL (ref 0.1–1.0)
Monocytes Relative: 11 % (ref 3–12)
RBC: 5.26 MIL/uL (ref 4.22–5.81)

## 2012-04-29 LAB — LIPID PANEL
Cholesterol: 220 mg/dL — ABNORMAL HIGH (ref 0–200)
HDL: 49 mg/dL (ref >39)
Total CHOL/HDL Ratio: 4.5 Ratio

## 2012-04-29 LAB — COMPREHENSIVE METABOLIC PANEL
Alkaline Phosphatase: 61 U/L (ref 39–117)
BUN: 15 mg/dL (ref 6–23)
CO2: 29 mEq/L (ref 19–32)
Glucose, Bld: 95 mg/dL (ref 70–99)
Sodium: 142 mEq/L (ref 135–145)
Total Bilirubin: 0.6 mg/dL (ref 0.3–1.2)
Total Protein: 7.2 g/dL (ref 6.0–8.3)

## 2012-04-29 MED ORDER — LOSARTAN POTASSIUM-HCTZ 50-12.5 MG PO TABS: 1.0000 | ORAL_TABLET | Freq: Every day | ORAL | Status: DC

## 2012-04-29 MED ORDER — PRAVASTATIN SODIUM 40 MG PO TABS: 40.0000 mg | ORAL_TABLET | Freq: Every day | ORAL | Status: DC

## 2012-04-29 NOTE — Progress Notes (Signed)
  Subjective:    Patient ID: Mario Garcia, male    DOB: [birth year], 79 y.o.   MRN: [id number]  HPI He is here for an interval evaluation. He is not taking Hyzaar due to an ACE cough and is doing quite well on this. He has had no trouble with that. He does have a history of prostate cancer and has routine followup concerning this. He continues on Pravachol. He keeps himself quite active. He has no other particular concerns or complaints. His social and family history were reviewed. His marriage is going quite well. Colonoscopy was discussed. He is at this time not interested in having that repeated.   Review of Systems  Constitutional: Negative.   HENT: Negative.   Eyes: Negative.   Respiratory: Negative.   Cardiovascular: Negative.   Gastrointestinal: Negative.   Genitourinary: Negative.   Musculoskeletal: Negative.   Skin: Negative.   Neurological: Negative.   Hematological: Negative.   Psychiatric/Behavioral: Negative.        Objective:   Physical Exam alert and in no distress. Tympanic membranes and canals are normal. Throat is clear. Tonsils are normal. Neck is supple without adenopathy or thyromegaly. Cardiac exam shows a regular sinus rhythm without murmurs or gallops. Lungs are clear to auscultation. Funduscopic exam normal. Abdominal exam shows no masses or tenderness.       Assessment & Plan:   1. ACE-inhibitor cough    2. Hypertension  CBC with Differential, Comprehensive metabolic panel, losartan-hydrochlorothiazide (HYZAAR) 50-12.5 MG per tablet  3. Hyperlipidemia LDL goal < 100  Lipid panel, pravastatin (PRAVACHOL) 40 MG tablet  4. History of prostate cancer    5. Encounter for long-term (current) use of other medications  Lipid panel, CBC with Differential, Comprehensive metabolic panel   encouraged him to remain physically active. Continue on present medications. He will followup with his urologist as he normally does. I also stated that he could come here for his  yearly PSA if he would like.

## 2012-05-02 NOTE — Progress Notes (Signed)
Quick Note:  MAILED PT DIET INFO ______

## 2012-10-30 ENCOUNTER — Other Ambulatory Visit: Payer: Self-pay

## 2012-12-19 ENCOUNTER — Telehealth: Payer: Self-pay | Admitting: Family Medicine

## 2012-12-19 NOTE — Telephone Encounter (Signed)
Pt is requesting flu shot. Does not have appt but is wondering if he can have the regular flu shot instead of waiting on over 65. Pt's wife in hospital and ill. Please inform.

## 2012-12-23 ENCOUNTER — Ambulatory Visit (INDEPENDENT_AMBULATORY_CARE_PROVIDER_SITE_OTHER): Payer: Medicare Other | Admitting: Family Medicine

## 2012-12-23 ENCOUNTER — Encounter: Payer: Self-pay | Admitting: Family Medicine

## 2012-12-23 VITALS — BP 120/80 | HR 78 | Wt 168.0 lb

## 2012-12-23 DIAGNOSIS — Z6379 Other stressful life events affecting family and household: Secondary | ICD-10-CM | POA: Diagnosis not present

## 2012-12-23 DIAGNOSIS — J029 Acute pharyngitis, unspecified: Secondary | ICD-10-CM | POA: Diagnosis not present

## 2012-12-23 DIAGNOSIS — Z23 Encounter for immunization: Secondary | ICD-10-CM

## 2012-12-23 LAB — POCT RAPID STREP A (OFFICE): Rapid Strep A Screen: NEGATIVE

## 2012-12-23 MED ORDER — AZITHROMYCIN 500 MG PO TABS: 500.0000 mg | ORAL_TABLET | Freq: Every day | ORAL | Status: DC

## 2012-12-23 NOTE — Progress Notes (Signed)
  Subjective:    Patient ID: Mario Garcia, male    DOB: 05/18/1931, 79 y.o.   MRN: [id number]  HPI He complains of a slight sore throat and exposure to strep. He also complains of slight redness to the right eye but no drainage.  Also his wife is gravely ill and in the hospital with lymphoma. He admits to being under a lot of stress and is depressed over the situation.   Review of Systems     Objective:   Physical Exam alert and in no distress. Conjunctiva are clear. Tympanic membranes and canals are normal. Throat is clear. Tonsils are normal. Neck is supple without adenopathy or thyromegaly. Cardiac exam shows a regular sinus rhythm without murmurs or gallops. Lungs are clear to auscultation. Strep screen is negative      Assessment & Plan:  Sore throat - Plan: POCT rapid strep A  Acute pharyngitis - Plan: azithromycin (ZITHROMAX) 500 MG tablet  Need for prophylactic vaccination and inoculation against influenza - Plan: Flu Vaccine QUAD 36+ mos IM  Stress due to illness of family member Have decided to go ahead and treat him in spite of his negative screen especially since he was exposed to strep from his daughter and his wife being sick in the hospital. Also discussed distress and he is under and certainly acknowledged this  and told him to not hide this.

## 2013-02-05 DIAGNOSIS — C61 Malignant neoplasm of prostate: Secondary | ICD-10-CM | POA: Diagnosis not present

## 2013-06-09 DIAGNOSIS — H2510 Age-related nuclear cataract, unspecified eye: Secondary | ICD-10-CM | POA: Diagnosis not present

## 2013-06-23 ENCOUNTER — Telehealth: Payer: Self-pay | Admitting: Family Medicine

## 2013-06-23 DIAGNOSIS — I10 Essential (primary) hypertension: Secondary | ICD-10-CM

## 2013-06-23 MED ORDER — LOSARTAN POTASSIUM-HCTZ 50-12.5 MG PO TABS: 1.0000 | ORAL_TABLET | Freq: Every day | ORAL | Status: DC

## 2013-06-23 NOTE — Telephone Encounter (Signed)
Pt made an appt but needs refills on losartian hctz sent to [facility] on [address].

## 2013-06-23 NOTE — Telephone Encounter (Signed)
Done. Pt has physical coming up

## 2013-06-26 ENCOUNTER — Other Ambulatory Visit: Payer: Self-pay

## 2013-06-26 DIAGNOSIS — E785 Hyperlipidemia, unspecified: Secondary | ICD-10-CM

## 2013-06-26 MED ORDER — PRAVASTATIN SODIUM 40 MG PO TABS: 40.0000 mg | ORAL_TABLET | Freq: Every day | ORAL | Status: DC

## 2013-07-14 ENCOUNTER — Ambulatory Visit (INDEPENDENT_AMBULATORY_CARE_PROVIDER_SITE_OTHER): Payer: Medicare Other | Admitting: Family Medicine

## 2013-07-14 ENCOUNTER — Encounter: Payer: Self-pay | Admitting: Family Medicine

## 2013-07-14 VITALS — BP 120/74 | HR 60 | Wt 173.0 lb

## 2013-07-14 DIAGNOSIS — Z79899 Other long term (current) drug therapy: Secondary | ICD-10-CM | POA: Diagnosis not present

## 2013-07-14 DIAGNOSIS — E785 Hyperlipidemia, unspecified: Secondary | ICD-10-CM

## 2013-07-14 DIAGNOSIS — I10 Essential (primary) hypertension: Secondary | ICD-10-CM

## 2013-07-14 LAB — CBC WITH DIFFERENTIAL/PLATELET
Basophils Absolute: 0 10*3/uL (ref 0.0–0.1)
Basophils Relative: 0 % (ref 0–1)
Eosinophils Absolute: 0.1 10*3/uL (ref 0.0–0.7)
Eosinophils Relative: 1 % (ref 0–5)
HEMATOCRIT: 44.1 % (ref 39.0–52.0)
HEMOGLOBIN: 14.9 g/dL (ref 13.0–17.0)
LYMPHS ABS: 2.8 10*3/uL (ref 0.7–4.0)
LYMPHS PCT: 43 % (ref 12–46)
MCH: 29.6 pg (ref 26.0–34.0)
MCHC: 33.8 g/dL (ref 30.0–36.0)
MCV: 87.7 fL (ref 78.0–100.0)
MONO ABS: 0.6 10*3/uL (ref 0.1–1.0)
Monocytes Relative: 10 % (ref 3–12)
Neutro Abs: 2.9 10*3/uL (ref 1.7–7.7)
Neutrophils Relative %: 46 % (ref 43–77)
Platelets: 146 10*3/uL — ABNORMAL LOW (ref 150–400)
RBC: 5.03 MIL/uL (ref 4.22–5.81)
RDW: 13.1 % (ref 11.5–15.5)
WBC: 6.4 10*3/uL (ref 4.0–10.5)

## 2013-07-14 MED ORDER — PRAVASTATIN SODIUM 40 MG PO TABS: 40.0000 mg | ORAL_TABLET | Freq: Every day | ORAL | Status: DC

## 2013-07-14 MED ORDER — LOSARTAN POTASSIUM-HCTZ 50-12.5 MG PO TABS: 1.0000 | ORAL_TABLET | Freq: Every day | ORAL | Status: DC

## 2013-07-14 NOTE — Progress Notes (Deleted)
   Subjective:    Patient ID: NICKOLAUS BORDELON, male    DOB: 05-28-1931, 80 y.o.   MRN: [id number]  HPI   Mr. Stitely is a 80 yo male with PMH significant for HTN who presents today for a medication check. The patient has no acute complaints and feels well overall. The patient reports that he is taking his medications without issue and reports no new side effects. The patient does not use tobacco products and has a glass of wine with dinner a couple of nights a week. The patient's diet and exercise are going well, though he self-reports over-eating occasionally. He is aware of this however and attempting to change this habit.    Review of Systems is negative except per HPI.     Objective:   Physical Exam  Constitutional: Patient is oriented to person, place, and time and well-developed, well-nourished, and in no distress. Cardiovascular: Normal rate, regular rhythm and intact distal pulses. Exam reveals no murmurs, gallops and no friction rub.  Pulmonary/Chest: Effort normal and breath sounds normal. No respiratory distress. No wheezes or ronchi.      Assessment & Plan:

## 2013-07-14 NOTE — Progress Notes (Signed)
   Subjective:    Patient ID: ELDEAN NANNA, male    DOB: 18-Sep-1931, 80 y.o.   MRN: [id number]  HPI   Mr. Mccaffrey is a 80 yo male with PMH significant for HTN who presents today for a medication check. The patient has no acute complaints and feels well overall. The patient reports that he is taking his medications without issue and reports no new side effects. The patient does not use tobacco products and has a glass of wine with dinner a couple of nights a week. The patient's diet and exercise are going well, though he self-reports over-eating occasionally. He is aware of this however and attempting to change this habit.  His home life is more stable. His wife did have lymphoma she is doing much better now. Review of Systems is negative except per HPI.     Objective:   Physical Exam  Constitutional: Patient is oriented to person, place, and time and well-developed, well-nourished, and in no distress. Cardiovascular: Normal rate, regular rhythm and intact distal pulses. Exam reveals no murmurs, gallops and no friction rub.  Pulmonary/Chest: Effort normal and breath sounds normal. No respiratory distress. No wheezes or ronchi.      Assessment & Plan:  Hypertension - Plan: losartan-hydrochlorothiazide (HYZAAR) 50-12.5 MG per tablet, Comprehensive metabolic panel, CBC with Differential  Hyperlipidemia LDL goal < 100 - Plan: pravastatin (PRAVACHOL) 40 MG tablet, Lipid panel  Encounter for long-term (current) use of other medications - Plan: Comprehensive metabolic panel, CBC with Differential, Lipid panel

## 2013-07-15 LAB — COMPREHENSIVE METABOLIC PANEL
ALT: 22 U/L (ref 0–53)
AST: 24 U/L (ref 0–37)
Albumin: 4.3 g/dL (ref 3.5–5.2)
Alkaline Phosphatase: 54 U/L (ref 39–117)
BUN: 14 mg/dL (ref 6–23)
CO2: 28 meq/L (ref 19–32)
CREATININE: 0.85 mg/dL (ref 0.50–1.35)
Calcium: 9 mg/dL (ref 8.4–10.5)
Chloride: 101 mEq/L (ref 96–112)
Glucose, Bld: 90 mg/dL (ref 70–99)
Potassium: 4.5 mEq/L (ref 3.5–5.3)
Sodium: 139 mEq/L (ref 135–145)
Total Bilirubin: 0.5 mg/dL (ref 0.2–1.2)
Total Protein: 6.8 g/dL (ref 6.0–8.3)

## 2013-07-15 LAB — LIPID PANEL
Cholesterol: 183 mg/dL (ref 0–200)
HDL: 46 mg/dL (ref >39)
LDL CALC: 95 mg/dL (ref 0–99)
TRIGLYCERIDES: 209 mg/dL — AB (ref <150)
Total CHOL/HDL Ratio: 4 Ratio
VLDL: 42 mg/dL — ABNORMAL HIGH (ref 0–40)

## 2013-12-22 ENCOUNTER — Other Ambulatory Visit (INDEPENDENT_AMBULATORY_CARE_PROVIDER_SITE_OTHER): Payer: Medicare Other

## 2013-12-22 DIAGNOSIS — Z23 Encounter for immunization: Secondary | ICD-10-CM

## 2014-02-11 DIAGNOSIS — N5201 Erectile dysfunction due to arterial insufficiency: Secondary | ICD-10-CM | POA: Diagnosis not present

## 2014-02-11 DIAGNOSIS — C61 Malignant neoplasm of prostate: Secondary | ICD-10-CM | POA: Diagnosis not present

## 2014-05-12 ENCOUNTER — Encounter: Payer: Self-pay | Admitting: Family Medicine

## 2014-05-12 ENCOUNTER — Ambulatory Visit (INDEPENDENT_AMBULATORY_CARE_PROVIDER_SITE_OTHER): Payer: Medicare Other | Admitting: Family Medicine

## 2014-05-12 VITALS — BP 118/76 | HR 63 | Wt 175.0 lb

## 2014-05-12 DIAGNOSIS — M7581 Other shoulder lesions, right shoulder: Secondary | ICD-10-CM

## 2014-05-12 MED ORDER — LIDOCAINE HCL (PF) 2 % IJ SOLN: 3.0000 mL | Freq: Once | INTRAMUSCULAR | Status: DC

## 2014-05-12 MED ORDER — TRIAMCINOLONE ACETONIDE 40 MG/ML IJ SUSP: 40.0000 mg | Freq: Once | INTRAMUSCULAR | Status: DC

## 2014-05-12 NOTE — Progress Notes (Signed)
   Subjective:    Patient ID: Mario Garcia, male    DOB: 1931-11-11, 81 y.o.   MRN: [id number]  HPI He complains of a two-month history of right shoulder pain. He has a previous history of difficulty with his shoulder that required an injection. He was apparently told her were some arthritic changes in there. He now notes pain especially when he is asleep at night. He has difficulty with abduction and external rotation. He has no other symptoms. It is not interfering with his ADLs.   Review of Systems     Objective:   Physical Exam Alert and in no distress. Full motion of the shoulder especially internal and external rotation as well as abduction. No point tenderness noted. Negative sulcus sign. Neer's and Hawkins test negative. Drop arm test negative.       Assessment & Plan:  Rotator cuff tendinitis, right  I discussed options with him and decided to give him an injection. The area was prepped with Betadine .40 mg of Kenalog and 3 mL of Xylocaine was injected into the right subacromial bursa without difficulty. He did obtain relatively quick relief of his symptoms. He will call if further trouble.

## 2014-08-17 ENCOUNTER — Other Ambulatory Visit: Payer: Self-pay | Admitting: Family Medicine

## 2014-08-31 DIAGNOSIS — S46911A Strain of unspecified muscle, fascia and tendon at shoulder and upper arm level, right arm, initial encounter: Secondary | ICD-10-CM | POA: Diagnosis not present

## 2014-09-09 DIAGNOSIS — S46911D Strain of unspecified muscle, fascia and tendon at shoulder and upper arm level, right arm, subsequent encounter: Secondary | ICD-10-CM | POA: Diagnosis not present

## 2014-09-21 DIAGNOSIS — S46011D Strain of muscle(s) and tendon(s) of the rotator cuff of right shoulder, subsequent encounter: Secondary | ICD-10-CM | POA: Diagnosis not present

## 2014-09-29 DIAGNOSIS — M19011 Primary osteoarthritis, right shoulder: Secondary | ICD-10-CM | POA: Diagnosis not present

## 2014-09-29 DIAGNOSIS — M7541 Impingement syndrome of right shoulder: Secondary | ICD-10-CM | POA: Diagnosis not present

## 2014-09-29 DIAGNOSIS — M24111 Other articular cartilage disorders, right shoulder: Secondary | ICD-10-CM | POA: Diagnosis not present

## 2014-09-29 DIAGNOSIS — S43431A Superior glenoid labrum lesion of right shoulder, initial encounter: Secondary | ICD-10-CM | POA: Diagnosis not present

## 2014-09-29 DIAGNOSIS — M75101 Unspecified rotator cuff tear or rupture of right shoulder, not specified as traumatic: Secondary | ICD-10-CM | POA: Diagnosis not present

## 2014-09-29 DIAGNOSIS — G8918 Other acute postprocedural pain: Secondary | ICD-10-CM | POA: Diagnosis not present

## 2014-09-29 DIAGNOSIS — S46011A Strain of muscle(s) and tendon(s) of the rotator cuff of right shoulder, initial encounter: Secondary | ICD-10-CM | POA: Diagnosis not present

## 2014-10-09 DIAGNOSIS — Z4789 Encounter for other orthopedic aftercare: Secondary | ICD-10-CM | POA: Diagnosis not present

## 2014-10-09 DIAGNOSIS — S46011D Strain of muscle(s) and tendon(s) of the rotator cuff of right shoulder, subsequent encounter: Secondary | ICD-10-CM | POA: Diagnosis not present

## 2014-10-12 DIAGNOSIS — S46011D Strain of muscle(s) and tendon(s) of the rotator cuff of right shoulder, subsequent encounter: Secondary | ICD-10-CM | POA: Diagnosis not present

## 2014-10-15 DIAGNOSIS — S46011D Strain of muscle(s) and tendon(s) of the rotator cuff of right shoulder, subsequent encounter: Secondary | ICD-10-CM | POA: Diagnosis not present

## 2014-10-19 DIAGNOSIS — S46011D Strain of muscle(s) and tendon(s) of the rotator cuff of right shoulder, subsequent encounter: Secondary | ICD-10-CM | POA: Diagnosis not present

## 2014-10-23 DIAGNOSIS — S46011D Strain of muscle(s) and tendon(s) of the rotator cuff of right shoulder, subsequent encounter: Secondary | ICD-10-CM | POA: Diagnosis not present

## 2014-10-27 DIAGNOSIS — S46011D Strain of muscle(s) and tendon(s) of the rotator cuff of right shoulder, subsequent encounter: Secondary | ICD-10-CM | POA: Diagnosis not present

## 2014-10-30 DIAGNOSIS — S46011D Strain of muscle(s) and tendon(s) of the rotator cuff of right shoulder, subsequent encounter: Secondary | ICD-10-CM | POA: Diagnosis not present

## 2014-11-03 DIAGNOSIS — S46011D Strain of muscle(s) and tendon(s) of the rotator cuff of right shoulder, subsequent encounter: Secondary | ICD-10-CM | POA: Diagnosis not present

## 2014-11-05 DIAGNOSIS — S46011D Strain of muscle(s) and tendon(s) of the rotator cuff of right shoulder, subsequent encounter: Secondary | ICD-10-CM | POA: Diagnosis not present

## 2014-11-06 DIAGNOSIS — S46011D Strain of muscle(s) and tendon(s) of the rotator cuff of right shoulder, subsequent encounter: Secondary | ICD-10-CM | POA: Diagnosis not present

## 2014-11-06 DIAGNOSIS — Z4789 Encounter for other orthopedic aftercare: Secondary | ICD-10-CM | POA: Diagnosis not present

## 2014-11-10 DIAGNOSIS — S46011D Strain of muscle(s) and tendon(s) of the rotator cuff of right shoulder, subsequent encounter: Secondary | ICD-10-CM | POA: Diagnosis not present

## 2014-11-12 DIAGNOSIS — S46011D Strain of muscle(s) and tendon(s) of the rotator cuff of right shoulder, subsequent encounter: Secondary | ICD-10-CM | POA: Diagnosis not present

## 2014-11-17 DIAGNOSIS — S46011D Strain of muscle(s) and tendon(s) of the rotator cuff of right shoulder, subsequent encounter: Secondary | ICD-10-CM | POA: Diagnosis not present

## 2014-11-19 DIAGNOSIS — S46011D Strain of muscle(s) and tendon(s) of the rotator cuff of right shoulder, subsequent encounter: Secondary | ICD-10-CM | POA: Diagnosis not present

## 2014-11-24 DIAGNOSIS — S46011D Strain of muscle(s) and tendon(s) of the rotator cuff of right shoulder, subsequent encounter: Secondary | ICD-10-CM | POA: Diagnosis not present

## 2014-11-26 DIAGNOSIS — S46011D Strain of muscle(s) and tendon(s) of the rotator cuff of right shoulder, subsequent encounter: Secondary | ICD-10-CM | POA: Diagnosis not present

## 2014-11-27 ENCOUNTER — Other Ambulatory Visit: Payer: Self-pay | Admitting: Family Medicine

## 2014-12-03 ENCOUNTER — Ambulatory Visit (INDEPENDENT_AMBULATORY_CARE_PROVIDER_SITE_OTHER): Payer: Medicare Other | Admitting: Family Medicine

## 2014-12-03 ENCOUNTER — Encounter: Payer: Self-pay | Admitting: Family Medicine

## 2014-12-03 VITALS — BP 130/70 | HR 60 | Ht 70.0 in | Wt 174.0 lb

## 2014-12-03 DIAGNOSIS — Z23 Encounter for immunization: Secondary | ICD-10-CM

## 2014-12-03 DIAGNOSIS — I10 Essential (primary) hypertension: Secondary | ICD-10-CM

## 2014-12-03 DIAGNOSIS — Z8546 Personal history of malignant neoplasm of prostate: Secondary | ICD-10-CM | POA: Diagnosis not present

## 2014-12-03 DIAGNOSIS — E785 Hyperlipidemia, unspecified: Secondary | ICD-10-CM | POA: Diagnosis not present

## 2014-12-03 DIAGNOSIS — M546 Pain in thoracic spine: Secondary | ICD-10-CM | POA: Diagnosis not present

## 2014-12-03 DIAGNOSIS — M549 Dorsalgia, unspecified: Secondary | ICD-10-CM

## 2014-12-03 LAB — CBC WITH DIFFERENTIAL/PLATELET
BASOS PCT: 0 % (ref 0–1)
Basophils Absolute: 0 10*3/uL (ref 0.0–0.1)
EOS ABS: 0.1 10*3/uL (ref 0.0–0.7)
EOS PCT: 1 % (ref 0–5)
HCT: 43.2 % (ref 39.0–52.0)
HEMOGLOBIN: 14.5 g/dL (ref 13.0–17.0)
Lymphocytes Relative: 38 % (ref 12–46)
Lymphs Abs: 2.9 10*3/uL (ref 0.7–4.0)
MCH: 29.4 pg (ref 26.0–34.0)
MCHC: 33.6 g/dL (ref 30.0–36.0)
MCV: 87.4 fL (ref 78.0–100.0)
MONO ABS: 0.7 10*3/uL (ref 0.1–1.0)
MPV: 11.3 fL (ref 8.6–12.4)
Monocytes Relative: 9 % (ref 3–12)
NEUTROS ABS: 4 10*3/uL (ref 1.7–7.7)
Neutrophils Relative %: 52 % (ref 43–77)
PLATELETS: 160 10*3/uL (ref 150–400)
RBC: 4.94 MIL/uL (ref 4.22–5.81)
RDW: 13.2 % (ref 11.5–15.5)
WBC: 7.6 10*3/uL (ref 4.0–10.5)

## 2014-12-03 LAB — COMPREHENSIVE METABOLIC PANEL
ALT: 15 U/L (ref 9–46)
AST: 20 U/L (ref 10–35)
Albumin: 4.5 g/dL (ref 3.6–5.1)
Alkaline Phosphatase: 62 U/L (ref 40–115)
BILIRUBIN TOTAL: 0.4 mg/dL (ref 0.2–1.2)
BUN: 22 mg/dL (ref 7–25)
CHLORIDE: 102 mmol/L (ref 98–110)
CO2: 26 mmol/L (ref 20–31)
CREATININE: 0.82 mg/dL (ref 0.70–1.11)
Calcium: 9.1 mg/dL (ref 8.6–10.3)
GLUCOSE: 99 mg/dL (ref 65–99)
Potassium: 3.8 mmol/L (ref 3.5–5.3)
SODIUM: 140 mmol/L (ref 135–146)
Total Protein: 7 g/dL (ref 6.1–8.1)

## 2014-12-03 LAB — LIPID PANEL
Cholesterol: 209 mg/dL — ABNORMAL HIGH (ref 125–200)
HDL: 40 mg/dL (ref >=40)
Total CHOL/HDL Ratio: 5.2 Ratio — ABNORMAL HIGH (ref <=5.0)
Triglycerides: 477 mg/dL — ABNORMAL HIGH (ref <150)

## 2014-12-03 MED ORDER — PRAVASTATIN SODIUM 40 MG PO TABS: 40.0000 mg | ORAL_TABLET | Freq: Every day | ORAL | Status: DC

## 2014-12-03 MED ORDER — LOSARTAN POTASSIUM-HCTZ 50-12.5 MG PO TABS: 1.0000 | ORAL_TABLET | Freq: Every day | ORAL | Status: DC

## 2014-12-03 NOTE — Progress Notes (Signed)
   Subjective:    Patient ID: Mario Garcia, male    DOB: 08-04-1931, 81 y.o.   MRN: [id number]  HPI He is here for medication management. He recently had right shoulder surgery but is still having some difficulty with trapezius type discomfort. He continues in physical therapy. They have looked at this and says that it is not related to his shoulder. He continues on his blood pressure medication as well as Pravachol and is having no difficulty with them. He does have a remote history of prostate cancer and does get follow-up on that on a yearly basis. He has no other concerns or complaints.   Review of Systems     Objective:   Physical Exam Alert and in no distress. Tympanic membranes and canals are normal. Pharyngeal area is normal. Neck is supple without adenopathy or thyromegaly. Cardiac exam shows a regular sinus rhythm without murmurs or gallops. Lungs are clear to auscultation.Normal motion of the neck. Possible trigger point noted in the mid trapezius.        Assessment & Plan:  Essential hypertension - Plan: losartan-hydrochlorothiazide (HYZAAR) 50-12.5 MG per tablet, CBC with Differential/Platelet, Comprehensive metabolic panel  Hyperlipidemia with target LDL less than 100 - Plan: pravastatin (PRAVACHOL) 40 MG tablet, Lipid panel  History of prostate cancer  Need for prophylactic vaccination against Streptococcus pneumoniae (pneumococcus) - Plan: Pneumococcal conjugate vaccine 13-valent  Need for prophylactic vaccination and inoculation against influenza - Plan: Flu vaccine HIGH DOSE PF (Fluzone High dose) Instructed him on stretching exercises for his neck and upper back area. Immunizations were updated.

## 2014-12-07 DIAGNOSIS — S46011D Strain of muscle(s) and tendon(s) of the rotator cuff of right shoulder, subsequent encounter: Secondary | ICD-10-CM | POA: Diagnosis not present

## 2014-12-07 DIAGNOSIS — Z4789 Encounter for other orthopedic aftercare: Secondary | ICD-10-CM | POA: Diagnosis not present

## 2015-02-17 DIAGNOSIS — Z8546 Personal history of malignant neoplasm of prostate: Secondary | ICD-10-CM | POA: Diagnosis not present

## 2015-02-17 DIAGNOSIS — C61 Malignant neoplasm of prostate: Secondary | ICD-10-CM | POA: Diagnosis not present

## 2015-04-21 ENCOUNTER — Ambulatory Visit (INDEPENDENT_AMBULATORY_CARE_PROVIDER_SITE_OTHER): Payer: Medicare Other | Admitting: Family Medicine

## 2015-04-21 ENCOUNTER — Encounter: Payer: Self-pay | Admitting: Family Medicine

## 2015-04-21 VITALS — BP 124/82 | HR 72 | Wt 171.0 lb

## 2015-04-21 DIAGNOSIS — H6691 Otitis media, unspecified, right ear: Secondary | ICD-10-CM

## 2015-04-21 DIAGNOSIS — H6121 Impacted cerumen, right ear: Secondary | ICD-10-CM

## 2015-04-21 MED ORDER — CLARITHROMYCIN 500 MG PO TABS: 500.0000 mg | ORAL_TABLET | Freq: Two times a day (BID) | ORAL | Status: DC

## 2015-04-21 NOTE — Progress Notes (Signed)
   Subjective:    Patient ID: Mario Garcia, male    DOB: May 25, 1931, 82 y.o.   MRN: [id number]  HPI He has a ten-day history of sharp right-sided facial, ear and throat discomfort the last several seconds and then goes away. No blurred vision double vision, fever, chills, nausea, vomiting or weakness. He has had 5 or 6 of these episodes in the last 10 days.   Review of Systems     Objective:   Physical Exam Left TM and canal are normal. Right TM is dull and vascular slightly retracted. This was visualized after a significant amount of cerumen was lavaged from the canal. Canal is normal. Neck is supple without adenopathy. Throat is clear.       Assessment & Plan:  Acute right otitis media, recurrence not specified, unspecified otitis media type - Plan: clarithromycin (BIAXIN) 500 MG tablet  Cerumen impaction, right He wil if not entirely better when he finishes the antibiotic.

## 2015-09-20 ENCOUNTER — Telehealth: Payer: Self-pay | Admitting: Family Medicine

## 2015-09-20 ENCOUNTER — Ambulatory Visit (INDEPENDENT_AMBULATORY_CARE_PROVIDER_SITE_OTHER): Payer: Medicare Other | Admitting: Family Medicine

## 2015-09-20 ENCOUNTER — Encounter: Payer: Self-pay | Admitting: Family Medicine

## 2015-09-20 VITALS — BP 160/80 | HR 54 | Ht 70.0 in | Wt 169.4 lb

## 2015-09-20 DIAGNOSIS — R51 Headache: Secondary | ICD-10-CM

## 2015-09-20 DIAGNOSIS — R5383 Other fatigue: Secondary | ICD-10-CM | POA: Diagnosis not present

## 2015-09-20 DIAGNOSIS — R519 Headache, unspecified: Secondary | ICD-10-CM

## 2015-09-20 DIAGNOSIS — L989 Disorder of the skin and subcutaneous tissue, unspecified: Secondary | ICD-10-CM

## 2015-09-20 DIAGNOSIS — H532 Diplopia: Secondary | ICD-10-CM | POA: Diagnosis not present

## 2015-09-20 DIAGNOSIS — R292 Abnormal reflex: Secondary | ICD-10-CM

## 2015-09-20 LAB — CBC WITH DIFFERENTIAL/PLATELET
BASOS PCT: 0 %
Basophils Absolute: 0 cells/uL (ref 0–200)
Eosinophils Absolute: 67 cells/uL (ref 15–500)
Eosinophils Relative: 1 %
HEMATOCRIT: 43 % (ref 38.5–50.0)
HEMOGLOBIN: 14.4 g/dL (ref 13.2–17.1)
LYMPHS PCT: 41 %
Lymphs Abs: 2747 cells/uL (ref 850–3900)
MCH: 29.1 pg (ref 27.0–33.0)
MCHC: 33.5 g/dL (ref 32.0–36.0)
MCV: 87 fL (ref 80.0–100.0)
MPV: 11.3 fL (ref 7.5–12.5)
Monocytes Absolute: 603 cells/uL (ref 200–950)
Monocytes Relative: 9 %
NEUTROS ABS: 3283 {cells}/uL (ref 1500–7800)
Neutrophils Relative %: 49 %
Platelets: 163 10*3/uL (ref 140–400)
RBC: 4.94 MIL/uL (ref 4.20–5.80)
RDW: 13.2 % (ref 11.0–15.0)
WBC: 6.7 10*3/uL (ref 4.0–10.5)

## 2015-09-20 LAB — SEDIMENTATION RATE: Sed Rate: 6 mm/hr (ref 0–20)

## 2015-09-20 NOTE — Progress Notes (Signed)
   Subjective:    Patient ID: Mario Garcia, male    DOB: 1931/06/01, 82 y.o.   MRN: [id number]  HPI He is here for evaluation of double vision. He states Thursday he noted double vision especially when he would look down or to the left. At roughly the same time he did note some right temporal headache as well as some fatigue but no nausea, vomiting, weakness, falling or staggering. No previous history of difficulty with headaches. He also has a lesion on the right temporal area that he would like further evaluated. He is on no new medications. He's had no chest pain, shortness of breath,Skin or hair changes   Review of Systems     Objective:   Physical Exam alert and in no distress. Extraocular muscles grossly intact however double vision noted with left and right lateral gaze and light reflex was asymmetric when evaluating the pupils. Other cranial nerves grossly intact. DTRs are diminished bilaterally. Cardiac and lung exam normal. Cerebellar testing normal. Normal finger to nose. No clonus noted. Sam of the visual field does show a defect in the left superior field Exam of the right temporal area does show a raised appearing lesion with some riding associated with it.      Assessment & Plan:  Double vision - Plan: CBC with Differential/Platelet, Comprehensive metabolic panel, TSH, Vitamin B12, Folate  Right temporal headache - Plan: CBC with Differential/Platelet, Comprehensive metabolic panel, TSH, Vitamin B12, Folate  Diminished reflexes on examination - Plan: CBC with Differential/Platelet, Comprehensive metabolic panel, TSH, Vitamin B12, Folate  Skin lesion of face - Plan: Ambulatory referral to Dermatology Skin lesion could possibly represent squamous cell. Referral will be made. I will also order a sedimentation rate. Attempted to call Dr. Talbert Forest and he has been in surgery all day.

## 2015-09-20 NOTE — Telephone Encounter (Signed)
Pt's wife, Rod Holler, called stating that they gave the wrong name of pt's eye doctor when he was here for an appt today. He went to [place]

## 2015-09-21 ENCOUNTER — Telehealth: Payer: Self-pay

## 2015-09-21 ENCOUNTER — Other Ambulatory Visit: Payer: Self-pay

## 2015-09-21 DIAGNOSIS — H532 Diplopia: Secondary | ICD-10-CM

## 2015-09-21 DIAGNOSIS — R519 Headache, unspecified: Secondary | ICD-10-CM

## 2015-09-21 DIAGNOSIS — R51 Headache: Secondary | ICD-10-CM

## 2015-09-21 LAB — COMPREHENSIVE METABOLIC PANEL WITH GFR
ALT: 17 U/L (ref 9–46)
AST: 17 U/L (ref 10–35)
Albumin: 4.3 g/dL (ref 3.6–5.1)
Alkaline Phosphatase: 57 U/L (ref 40–115)
BUN: 20 mg/dL (ref 7–25)
CO2: 29 mmol/L (ref 20–31)
Calcium: 9.1 mg/dL (ref 8.6–10.3)
Chloride: 101 mmol/L (ref 98–110)
Creat: 0.84 mg/dL (ref 0.70–1.11)
Glucose, Bld: 96 mg/dL (ref 65–99)
Potassium: 4 mmol/L (ref 3.5–5.3)
Sodium: 139 mmol/L (ref 135–146)
Total Bilirubin: 0.6 mg/dL (ref 0.2–1.2)
Total Protein: 7 g/dL (ref 6.1–8.1)

## 2015-09-21 LAB — VITAMIN B12: Vitamin B-12: 440 pg/mL (ref 200–1100)

## 2015-09-21 LAB — FOLATE: FOLATE: 14.9 ng/mL (ref >5.4)

## 2015-09-21 LAB — TSH: TSH: 0.68 m[IU]/L (ref 0.40–4.50)

## 2015-09-21 NOTE — Telephone Encounter (Signed)
Dr.Lalonde asked for pt to have MRI of the brain w/wo contrast I have put order in system called pt gave him [facility] # for him to call and make appointment due to their new protocol of questions pt verbalized understanding and is going to call

## 2015-09-22 ENCOUNTER — Ambulatory Visit
Admission: RE | Admit: 2015-09-22 | Discharge: 2015-09-22 | Disposition: A | Discharge status: Home / Self Care | Payer: Medicare Other | Source: Ambulatory Visit | Attending: Family Medicine | Admitting: Family Medicine

## 2015-09-22 DIAGNOSIS — H532 Diplopia: Secondary | ICD-10-CM

## 2015-09-22 DIAGNOSIS — R51 Headache: Secondary | ICD-10-CM | POA: Diagnosis not present

## 2015-09-22 DIAGNOSIS — R519 Headache, unspecified: Secondary | ICD-10-CM

## 2015-09-22 MED ORDER — GADOBENATE DIMEGLUMINE 529 MG/ML IV SOLN
15.0000 mL | Freq: Once | INTRAVENOUS | Status: AC | PRN
  Administered 2015-09-22: 15 mL via INTRAVENOUS

## 2015-09-23 DIAGNOSIS — H11153 Pinguecula, bilateral: Secondary | ICD-10-CM | POA: Diagnosis not present

## 2015-09-23 DIAGNOSIS — H2513 Age-related nuclear cataract, bilateral: Secondary | ICD-10-CM | POA: Diagnosis not present

## 2015-09-23 DIAGNOSIS — H25013 Cortical age-related cataract, bilateral: Secondary | ICD-10-CM | POA: Diagnosis not present

## 2015-09-23 DIAGNOSIS — H25043 Posterior subcapsular polar age-related cataract, bilateral: Secondary | ICD-10-CM | POA: Diagnosis not present

## 2015-10-11 DIAGNOSIS — L570 Actinic keratosis: Secondary | ICD-10-CM | POA: Diagnosis not present

## 2015-10-11 DIAGNOSIS — D225 Melanocytic nevi of trunk: Secondary | ICD-10-CM | POA: Diagnosis not present

## 2015-10-11 DIAGNOSIS — L821 Other seborrheic keratosis: Secondary | ICD-10-CM | POA: Diagnosis not present

## 2015-10-11 DIAGNOSIS — L814 Other melanin hyperpigmentation: Secondary | ICD-10-CM | POA: Diagnosis not present

## 2015-11-04 DIAGNOSIS — H25013 Cortical age-related cataract, bilateral: Secondary | ICD-10-CM | POA: Diagnosis not present

## 2015-11-04 DIAGNOSIS — H4910 Fourth [trochlear] nerve palsy, unspecified eye: Secondary | ICD-10-CM | POA: Diagnosis not present

## 2015-11-04 DIAGNOSIS — H2513 Age-related nuclear cataract, bilateral: Secondary | ICD-10-CM | POA: Diagnosis not present

## 2015-11-16 ENCOUNTER — Ambulatory Visit (INDEPENDENT_AMBULATORY_CARE_PROVIDER_SITE_OTHER): Payer: Medicare Other | Admitting: Family Medicine

## 2015-11-16 ENCOUNTER — Encounter: Payer: Self-pay | Admitting: Family Medicine

## 2015-11-16 VITALS — BP 110/70 | Wt 171.0 lb

## 2015-11-16 DIAGNOSIS — M791 Myalgia: Secondary | ICD-10-CM

## 2015-11-16 DIAGNOSIS — M7918 Myalgia, other site: Secondary | ICD-10-CM

## 2015-11-16 NOTE — Patient Instructions (Signed)
Heat for 20 minutes followed by gentle stretching. You can take 4 ibuprofen 3 times per day for the next 7-10 days

## 2015-11-16 NOTE — Progress Notes (Signed)
   Subjective:    Patient ID: Mario Garcia, male    DOB: 22-Mar-1932, 82 y.o.   MRN: [id number]  HPI He complains of a one-month history of right-sided low back pain that is intermittent in nature. It does tend to get worse with sitting and with lying in bed. He has no radiation, numbness or tingling. He does use occasional doses of ibuprofen with good results. He also notes that the pain can be exacerbated by lifting his right arm.   Review of Systems     Objective:   Physical Exam Alert and in no distress. Slight tenderness in the right paravertebral muscles proximal to the iliac crest. No tenderness over SI joints. Normal motion of the lumbar spine.       Assessment & Plan:  Musculoskeletal pain I explained that this is musculoskeletal in nature. Recommend conservative care with heat, anti-inflammatories and stretching.

## 2015-12-21 ENCOUNTER — Other Ambulatory Visit (INDEPENDENT_AMBULATORY_CARE_PROVIDER_SITE_OTHER): Payer: Medicare Other

## 2015-12-21 DIAGNOSIS — Z23 Encounter for immunization: Secondary | ICD-10-CM | POA: Diagnosis not present

## 2016-02-02 DIAGNOSIS — Z8546 Personal history of malignant neoplasm of prostate: Secondary | ICD-10-CM | POA: Diagnosis not present

## 2016-04-06 DIAGNOSIS — D1801 Hemangioma of skin and subcutaneous tissue: Secondary | ICD-10-CM | POA: Diagnosis not present

## 2016-04-06 DIAGNOSIS — L814 Other melanin hyperpigmentation: Secondary | ICD-10-CM | POA: Diagnosis not present

## 2016-04-06 DIAGNOSIS — D235 Other benign neoplasm of skin of trunk: Secondary | ICD-10-CM | POA: Diagnosis not present

## 2016-04-06 DIAGNOSIS — L821 Other seborrheic keratosis: Secondary | ICD-10-CM | POA: Diagnosis not present

## 2016-04-06 DIAGNOSIS — L570 Actinic keratosis: Secondary | ICD-10-CM | POA: Diagnosis not present

## 2016-04-13 ENCOUNTER — Other Ambulatory Visit: Payer: Self-pay | Admitting: Family Medicine

## 2016-04-13 DIAGNOSIS — E785 Hyperlipidemia, unspecified: Secondary | ICD-10-CM

## 2016-04-13 DIAGNOSIS — I10 Essential (primary) hypertension: Secondary | ICD-10-CM

## 2016-07-17 ENCOUNTER — Other Ambulatory Visit: Payer: Self-pay | Admitting: Family Medicine

## 2016-07-17 DIAGNOSIS — I10 Essential (primary) hypertension: Secondary | ICD-10-CM

## 2016-07-17 DIAGNOSIS — E785 Hyperlipidemia, unspecified: Secondary | ICD-10-CM

## 2016-08-24 ENCOUNTER — Encounter: Payer: Self-pay | Admitting: Family Medicine

## 2016-08-24 ENCOUNTER — Ambulatory Visit (INDEPENDENT_AMBULATORY_CARE_PROVIDER_SITE_OTHER): Payer: Medicare Other | Admitting: Family Medicine

## 2016-08-24 VITALS — BP 116/78 | HR 53 | Ht 69.5 in | Wt 170.0 lb

## 2016-08-24 DIAGNOSIS — I10 Essential (primary) hypertension: Secondary | ICD-10-CM

## 2016-08-24 DIAGNOSIS — E785 Hyperlipidemia, unspecified: Secondary | ICD-10-CM

## 2016-08-24 DIAGNOSIS — Z87442 Personal history of urinary calculi: Secondary | ICD-10-CM | POA: Insufficient documentation

## 2016-08-24 DIAGNOSIS — Z8546 Personal history of malignant neoplasm of prostate: Secondary | ICD-10-CM | POA: Diagnosis not present

## 2016-08-24 DIAGNOSIS — M791 Myalgia: Secondary | ICD-10-CM

## 2016-08-24 DIAGNOSIS — M7918 Myalgia, other site: Secondary | ICD-10-CM

## 2016-08-24 LAB — CBC WITH DIFFERENTIAL/PLATELET
BASOS PCT: 0 %
Basophils Absolute: 0 cells/uL (ref 0–200)
EOS ABS: 60 {cells}/uL (ref 15–500)
Eosinophils Relative: 1 %
HEMATOCRIT: 44.4 % (ref 38.5–50.0)
Hemoglobin: 14.4 g/dL (ref 13.2–17.1)
LYMPHS PCT: 40 %
Lymphs Abs: 2400 cells/uL (ref 850–3900)
MCH: 28.3 pg (ref 27.0–33.0)
MCHC: 32.4 g/dL (ref 32.0–36.0)
MCV: 87.2 fL (ref 80.0–100.0)
MONO ABS: 600 {cells}/uL (ref 200–950)
MONOS PCT: 10 %
MPV: 11.9 fL (ref 7.5–12.5)
NEUTROS ABS: 2940 {cells}/uL (ref 1500–7800)
Neutrophils Relative %: 49 %
PLATELETS: 155 10*3/uL (ref 140–400)
RBC: 5.09 MIL/uL (ref 4.20–5.80)
RDW: 13.2 % (ref 11.0–15.0)
WBC: 6 10*3/uL (ref 4.0–10.5)

## 2016-08-24 LAB — COMPREHENSIVE METABOLIC PANEL
ALK PHOS: 56 U/L (ref 40–115)
ALT: 19 U/L (ref 9–46)
AST: 20 U/L (ref 10–35)
Albumin: 4.2 g/dL (ref 3.6–5.1)
BILIRUBIN TOTAL: 0.4 mg/dL (ref 0.2–1.2)
BUN: 17 mg/dL (ref 7–25)
CO2: 27 mmol/L (ref 20–31)
Calcium: 9.1 mg/dL (ref 8.6–10.3)
Chloride: 102 mmol/L (ref 98–110)
Creat: 0.91 mg/dL (ref 0.70–1.11)
GLUCOSE: 84 mg/dL (ref 65–99)
Potassium: 4.2 mmol/L (ref 3.5–5.3)
Sodium: 139 mmol/L (ref 135–146)
TOTAL PROTEIN: 7 g/dL (ref 6.1–8.1)

## 2016-08-24 LAB — LIPID PANEL
CHOLESTEROL: 170 mg/dL (ref <200)
HDL: 45 mg/dL (ref >40)
LDL Cholesterol: 83 mg/dL (ref <100)
Total CHOL/HDL Ratio: 3.8 Ratio (ref <5.0)
Triglycerides: 212 mg/dL — ABNORMAL HIGH (ref <150)
VLDL: 42 mg/dL — ABNORMAL HIGH (ref <30)

## 2016-08-24 MED ORDER — PRAVASTATIN SODIUM 40 MG PO TABS: 40.0000 mg | ORAL_TABLET | Freq: Every day | ORAL | 3 refills | Status: DC

## 2016-08-24 MED ORDER — LOSARTAN POTASSIUM-HCTZ 50-12.5 MG PO TABS: 1.0000 | ORAL_TABLET | Freq: Every day | ORAL | 3 refills | Status: DC

## 2016-08-24 NOTE — Patient Instructions (Signed)
  Mario Garcia , Thank you for taking time to come for your Medicare Wellness Visit. I appreciate your ongoing commitment to your health goals. Please review the following plan we discussed and let me know if I can assist you in the future.   These are the goals we discussed: Goals    None      This is a list of the screening recommended for you and due dates:  Health Maintenance  Topic Date Due  . Flu Shot  10/25/2016  . Tetanus Vaccine  09/29/2017  . Pneumonia vaccines  Completed

## 2016-08-24 NOTE — Progress Notes (Addendum)
Subjective:   HPI  Mario Garcia is a 83 y.o. male who presents for Chief Complaint  Patient presents with  . Medicare Wellness    med check plus    Medical care team includes: Denita Lung, MD here for primary care  Dr.Borden   Preventative care:  Last ophthalmology visit: 8/17 Last dental visit:5/18 Last colonoscopy: 09/21/05 Last prostate exam: 11/17 Last EKG: Last labs:09/21/05  Prior vaccinations:  TD or Tdap: 09/30/07 Influenza:Up-to-date Pneumococcal:23:08/21/02 13; 12/03/14 Shingles/Zostavax: Recommend getting the Shingrix Other: 09/30/07  Advanced directive: No. Information given.   Concerns: His main concern today is continued difficulty with intermittent back pain. He was seen on his last visit which indicated the musculoskeletal reason behind this. He has been doing exercises and stretching but has not gotten as much benefit as he would like. He continues on his losartan without any problems. Also Pravachol is causing no muscle aches or pains. He rarely uses the Zantac. His marriage is going quite well. He is now almost 10 years since his diagnosis of prostate cancer. He did have robotic surgery. There is also history of renal stones but none recently. Last colonoscopy was 2007. He is not interested in having another one.  Reviewed their medical, surgical, family, social, medication, and allergy history and updated chart as appropriate.  Past Medical History:  Diagnosis Date  . Cancer ([place])    PROSTATE  . History of colonic polyps   . Hypertension   . Inguinal hernia 03/2002  . Nephrolithiasis     Past Surgical History:  Procedure Laterality Date  . COLONOSCOPY    . PROSTATE SURGERY  10/2005   RADIAL PROSTATECTOMY    Social History   Social History  . Marital status: Married    Spouse name: N/A  . Number of children: N/A  . Years of education: N/A   Occupational History  . Not on file.   Social History Main Topics  . Smoking status: Never  Smoker  . Smokeless tobacco: Never Used  . Alcohol use 3.0 oz/week    6 drink(s) per week  . Drug use: No  . Sexual activity: Not Currently   Other Topics Concern  . Not on file   Social History Narrative  . No narrative on file    Family History  Problem Relation Age of Onset  . Cancer Father   . Cancer Brother      Current Outpatient Prescriptions:  .  aspirin 81 MG tablet, Take 81 mg by mouth daily.  , Disp: , Rfl:  .  losartan-hydrochlorothiazide (HYZAAR) 50-12.5 MG tablet, TAKE ONE TABLET BY MOUTH ONCE DAILY, Disp: 90 tablet, Rfl: 0 .  pravastatin (PRAVACHOL) 40 MG tablet, TAKE ONE TABLET BY MOUTH ONCE DAILY, Disp: 90 tablet, Rfl: 0  Current Facility-Administered Medications:  .  lidocaine (XYLOCAINE) 2 % injection 3 mL, 3 mL, Intradermal, Once, [employer], Eliceo Gladu C, MD .  triamcinolone acetonide (KENALOG-40) injection 40 mg, 40 mg, Intramuscular, Once, Denita Lung, MD  Allergies  Allergen Reactions  . Penicillins        Review of Systems Negative except as above    Objective:   Vitals:   08/24/16 1010  BP: 116/78  Pulse: (!) 53    General appearance: alert, no distress, WD/WN, Caucasian male Skin: Lesion noted on right cheek. HEENT: normocephalic, conjunctiva/corneas normal, sclerae anicteric, PERRLA, EOMi, nares patent, no discharge or erythema, pharynx normal Oral cavity: MMM, tongue normal, teeth normal Neck: supple, no lymphadenopathy, no thyromegaly,  no masses, normal ROM, no bruits Heart: RRR, normal S1, S2, no murmurs Lungs: CTA bilaterally, no wheezes, rhonchi, or rales Abdomen: +bs, soft, non tender, non distended, no masses, no hepatomegaly, no splenomegaly, no bruits  Musculoskeletal: upper extremities non tender, no obvious deformity, normal ROM throughout, lower extremities non tender, no obvious deformity, normal ROM throughout Extremities: no edema, no cyanosis, no clubbing Pulses: 2+ symmetric, upper and lower extremities, normal cap  refill Neurological: alert, oriented x 3, CN2-12 intact, strength normal upper extremities and lower extremities, sensation normal throughout, DTRs 2+ throughout, no cerebellar signs, gait normal Psychiatric: normal affect, behavior normal, pleasant    Assessment and Plan :   Musculoskeletal pain - Plan: Ambulatory referral to Physical Therapy, CBC with Differential/Platelet, Comprehensive metabolic panel  History of prostate cancer  Hyperlipidemia with target LDL less than 100 - Plan: pravastatin (PRAVACHOL) 40 MG tablet, Lipid panel  Essential hypertension - Plan: losartan-hydrochlorothiazide (HYZAAR) 50-12.5 MG tablet, CBC with Differential/Platelet, Comprehensive metabolic panel  History of renal stone - Plan: CBC with Differential/Platelet, Comprehensive metabolic panel  he is doing well on the present medication regimen for the above diagnoses. The urologists is about sign off on him since he has been almost 10 years. He has not had any renal stones. Hopefully his therapy will help with his back pain. Also instructed him to go to the pharmacy to get an update on his tetanus and Shingrix.  Physical exam - discussed and counseled on healthy lifestyle, diet, exercise, preventative care, vaccinations, sick and well care, proper use of emergency dept and after hours care, and addressed their concerns.

## 2016-08-30 ENCOUNTER — Ambulatory Visit: Payer: Medicare Other | Attending: Family Medicine | Admitting: Physical Therapy

## 2016-08-30 ENCOUNTER — Encounter: Payer: Self-pay | Admitting: Physical Therapy

## 2016-08-30 DIAGNOSIS — M545 Low back pain, unspecified: Secondary | ICD-10-CM

## 2016-08-30 DIAGNOSIS — M6281 Muscle weakness (generalized): Secondary | ICD-10-CM | POA: Diagnosis not present

## 2016-08-30 DIAGNOSIS — G8929 Other chronic pain: Secondary | ICD-10-CM | POA: Diagnosis not present

## 2016-08-30 NOTE — Therapy (Signed)
[place], [place], [address] Phone: [phone]   Fax:  [phone]  Physical Therapy Evaluation  Patient Details  Name: Mario Garcia MRN: [id number] Date of Birth: 1931/08/04 Referring Provider: Denita Lung, MD  Encounter Date: 08/30/2016      PT End of Session - 08/30/16 0801    Visit Number 1   Number of Visits 9   Date for PT Re-Evaluation 09/29/16   Authorization Type [facility]- KX at visit 15   PT Start Time 0800   PT Stop Time 0842   PT Time Calculation (min) 42 min   Activity Tolerance Patient tolerated treatment well   Behavior During Therapy [facility] for tasks assessed/performed      Past Medical History:  Diagnosis Date  . Cancer ([place])    PROSTATE  . History of colonic polyps   . Hypertension   . Inguinal hernia 03/2002  . Nephrolithiasis     Past Surgical History:  Procedure Laterality Date  . COLONOSCOPY    . PROSTATE SURGERY  10/2005   RADIAL PROSTATECTOMY    There were no vitals filed for this visit.       Subjective Assessment - 08/30/16 0802    Subjective Past h/o L knee surgery 2003, bilateral shoulder surgery R 2016, L couple years prior. L piriformis pain that is intermittent, R LB and hip pain that makes sleeping and laying difficulty. Good days and bad days, some days R side takes my breath. Unable to sleep in a bed. Seated, L trunk rotation irritates R side. Feels tired across low back throughout the day.    Patient Stated Goals return to walking for exercise, decrease pain/fatigue, fishing, work in garden   Currently in Pain? Yes   Pain Score 1    Pain Location Back   Pain Orientation Right;Left;Lower   Pain Descriptors / Indicators Aching;Sharp   Pain Onset More than a month ago   Pain Frequency Intermittent   Aggravating Factors  sitting, laying down, end of day fatigue   Pain Relieving Factors heat, stretching            [facility] PT Assessment - 08/30/16  0001      Assessment   Medical Diagnosis musculoskeletal pain   Referring Provider Denita Lung, MD   Onset Date/Surgical Date --  1 year, chronic   Hand Dominance Right   Next MD Visit PRN   Prior Therapy not this year     Precautions   Precautions None     Restrictions   Weight Bearing Restrictions No     Balance Screen   Has the patient fallen in the past 6 months No     [place] residence   Living Arrangements Spouse/significant other   Additional Comments stairs at home     Prior Function   Level of Independence Independent     Cognition   Overall Cognitive Status Within Functional Limits for tasks assessed     Observation/Other Assessments   Focus on Therapeutic Outcomes (FOTO)  65% ability (goal 68%)     Sensation   Additional Comments WFL     ROM / Strength   AROM / PROM / Strength AROM;Strength     AROM   Overall AROM Comments L trunk sidebend=R tightness     Strength   Strength Assessment Site Hip   Right/Left Hip Right;Left   Right Hip Flexion 4/5   Right Hip Extension  4-/5   Right Hip ABduction 3/5   Left Hip Flexion 4/5   Left Hip Extension 4/5   Left Hip ABduction 3+/5     Palpation   Palpation comment trigger points-concordant pain in R QL and bilateral piriformis            Objective measurements completed on examination: See above findings.          [place] Adult PT Treatment/Exercise - 08/30/16 0001      Therapeutic Activites    Therapeutic Activities Other Therapeutic Activities   Other Therapeutic Activities resting posure-abdominal engagement in supine, seated and standing     Exercises   Exercises Lumbar     Lumbar Exercises: Stretches   Active Hamstring Stretch Limitations x5 each, slow movement   Quad Stretch Limitations thomas stretch for hip flexors bilaterally   Piriformis Stretch Limitations figure 4 pull across-limited on L     Manual Therapy   Manual Therapy  Myofascial release   Myofascial Release R QL                PT Education - 08/30/16 1036    Education provided Yes   Education Details anatomy of condition, POC, HEP, exercise form/rationale   Person(s) Educated Patient   Methods Explanation;Demonstration;Tactile cues;Verbal cues;Handout   Comprehension Verbalized understanding;Returned demonstration;Verbal cues required;Tactile cues required;Need further instruction          PT Short Term Goals - 08/30/16 0849      PT SHORT TERM GOAL #1   Title Pt will verbalize ability to utilize abdominal engagement in various postures to decrease LBP in functional activities by 6/22   Baseline began educating at eval   Time 2   Period Weeks   Status New           PT Long Term Goals - 08/30/16 0849      PT LONG TERM GOAL #1   Title FOTO 68% ability to indicate significant improvement in functional ability by 7/20   Baseline 66% ability at eval   Time 6   Period Weeks   Status New     PT LONG TERM GOAL #2   Title Pt will be able to return to sleeping in his bed rather than the recliner for improved rest without limitation by back pain   Baseline has to sleep in recliner at eval   Time 6   Period Weeks   Status New     PT LONG TERM GOAL #3   Title Hip MMT grossly 5/5 for proper support to lumbopelvic region   Baseline see flowsheet   Time 6   Period Weeks   Status New     PT LONG TERM GOAL #4   Title Pt will be able to return to gardening and fishing as he was before, LBP/end of day fatigue improved 75%   Baseline able with increased pain and significant fatigue at the end of the day   Time Nora - 08/30/16 8250    Clinical Impression Statement Pt presents to PT with complaints of LBP that has worsened in the last couple of months. Pt is relatively active but does not do any formal exercise. Symptoms consistent with QL and priiformis overuse and  underutilization of core and glut musculature for postural support. Concordant pain found in R QL and piriformis trigger points. Pt verbalized  decreased pain following treatment today. Pt will benefit from skilled PT in order to improve abdominal engagement to decrease overuse of lumbar musculature in functional activities.    History and Personal Factors relevant to plan of care: h/o L knee surgery   Clinical Presentation Evolving   Clinical Presentation due to: recently worsened after chronic on/off pain   Clinical Decision Making Low   Rehab Potential Good   PT Frequency 2x / week   PT Duration 4 weeks   PT Treatment/Interventions ADLs/Self Care Home Management;Electrical Stimulation;Cryotherapy;Iontophoresis 4mg /ml Dexamethasone;Functional mobility training;Stair training;Gait training;Ultrasound;Traction;Moist Heat;Therapeutic activities;Therapeutic exercise;Balance training;Neuromuscular re-education;Patient/family education;Passive range of motion;Manual techniques;Dry needling;Taping   PT Next Visit Plan myofascial release, abdominal & glut strengthening   PT Home Exercise Plan figure 4, thomas stretch, active hamstring stretch, abdominal engagement   Consulted and Agree with Plan of Care Patient      Patient will benefit from skilled therapeutic intervention in order to improve the following deficits and impairments:  Difficulty walking, Increased muscle spasms, Decreased activity tolerance, Pain, Improper body mechanics, Impaired flexibility, Postural dysfunction, Decreased strength  Visit Diagnosis: Chronic bilateral low back pain without sciatica - Plan: PT plan of care cert/re-cert  Muscle weakness (generalized) - Plan: PT plan of care cert/re-cert      G-Codes - 15/17/61 1048    Functional Assessment Tool Used (Outpatient Only) FOTO 66% ability, clinical judgement   Functional Limitation Mobility: Walking and moving around   Mobility: Walking and Moving Around Current Status  (Y0737) At least 20 percent but less than 40 percent impaired, limited or restricted   Mobility: Walking and Moving Around Goal Status (T0626) At least 1 percent but less than 20 percent impaired, limited or restricted       Problem List Patient Active Problem List   Diagnosis Date Noted  . History of renal stone 08/24/2016  . Hypertension 12/21/2010  . Hyperlipidemia with target LDL less than 100 12/21/2010  . History of prostate cancer 12/21/2010   Anuel Sitter C. Bartosz Luginbill PT, DPT 08/30/16 10:51 AM   [facility] [address] [place], [place], [address] Phone: [phone]   Fax:  [phone]  Name: Mario Garcia MRN: [id number] Date of Birth: 05/13/1931

## 2016-09-04 ENCOUNTER — Encounter: Payer: Self-pay | Admitting: Physical Therapy

## 2016-09-04 ENCOUNTER — Ambulatory Visit: Payer: Medicare Other | Admitting: Physical Therapy

## 2016-09-04 DIAGNOSIS — G8929 Other chronic pain: Secondary | ICD-10-CM | POA: Diagnosis not present

## 2016-09-04 DIAGNOSIS — M6281 Muscle weakness (generalized): Secondary | ICD-10-CM | POA: Diagnosis not present

## 2016-09-04 DIAGNOSIS — M545 Low back pain: Secondary | ICD-10-CM | POA: Diagnosis not present

## 2016-09-04 NOTE — Therapy (Signed)
[place], [place], [address] Phone: [phone]   Fax:  [phone]  Physical Therapy Treatment  Patient Details  Name: Mario Garcia MRN: [id number] Date of Birth: 07/25/1931 Referring Provider: Denita Lung, MD  Encounter Date: 09/04/2016    Past Medical History:  Diagnosis Date  . Cancer ([place])    PROSTATE  . History of colonic polyps   . Hypertension   . Inguinal hernia 03/2002  . Nephrolithiasis     Past Surgical History:  Procedure Laterality Date  . COLONOSCOPY    . PROSTATE SURGERY  10/2005   RADIAL PROSTATECTOMY    There were no vitals filed for this visit.                       [place] Adult PT Treatment/Exercise - 09/04/16 0001      Posture/Postural Control   Posture Comments sitting posture education.  practice     Self-Care   Self-Care --  ADL handout issued,. Discussed pro/cons of back braces     Lumbar Exercises: Stretches   Passive Hamstring Stretch 2 reps;20 seconds   Single Knee to Chest Stretch 2 reps;20 seconds   Piriformis Stretch 3 reps;30 seconds   Piriformis Stretch Limitations figure four, pull across     Lumbar Exercises: Supine   Bent Knee Raise 5 reps   Bent Knee Raise Limitations cues, monitored pelvic control     Manual Therapy   Manual Therapy Soft tissue mobilization;Myofascial release   Soft tissue mobilization tissue softened   Myofascial Release lumbar, lower thoracic, mid to upper gluteasls.  QL non tender today.  instrument assist                PT Education - 09/04/16 0859    Education provided Yes   Education Details ADL, self care   Person(s) Educated Patient   Methods Explanation;Demonstration;Tactile cues;Verbal cues;Handout   Comprehension Verbalized understanding;Returned demonstration;Need further instruction          PT Short Term Goals - 08/30/16 0849      PT SHORT TERM GOAL #1   Title Pt will  verbalize ability to utilize abdominal engagement in various postures to decrease LBP in functional activities by 6/22   Baseline began educating at eval   Time 2   Period Weeks   Status New           PT Long Term Goals - 08/30/16 0849      PT LONG TERM GOAL #1   Title FOTO 68% ability to indicate significant improvement in functional ability by 7/20   Baseline 66% ability at eval   Time 6   Period Weeks   Status New     PT LONG TERM GOAL #2   Title Pt will be able to return to sleeping in his bed rather than the recliner for improved rest without limitation by back pain   Baseline has to sleep in recliner at eval   Time 6   Period Weeks   Status New     PT LONG TERM GOAL #3   Title Hip MMT grossly 5/5 for proper support to lumbopelvic region   Baseline see flowsheet   Time 6   Period Weeks   Status New     PT LONG TERM GOAL #4   Title Pt will be able to return to gardening and fishing as he was before, LBP/end of day fatigue improved 75%  Baseline able with increased pain and significant fatigue at the end of the day   Time 6   Period Weeks   Status New               Plan - 09/04/16 0093    Clinical Impression Statement Pain 1/10 post session,  Patient showed good technique for stretching.  Back care education continued.  No new goals met.  Sat was a better than usual day,  Sunday pain incerased more than usual.    PT Treatment/Interventions ADLs/Self Care Home Management;Electrical Stimulation;Cryotherapy;Iontophoresis 54m/ml Dexamethasone;Functional mobility training;Stair training;Gait training;Ultrasound;Traction;Moist Heat;Therapeutic activities;Therapeutic exercise;Balance training;Neuromuscular re-education;Patient/family education;Passive range of motion;Manual techniques;Dry needling;Taping   PT Next Visit Plan myofascial release, abdominal & glut strengthening   PT Home Exercise Plan figure 4, thomas stretch, active hamstring stretch, abdominal  engagement,  ADL review when ready   Consulted and Agree with Plan of Care Patient      Patient will benefit from skilled therapeutic intervention in order to improve the following deficits and impairments:  Difficulty walking, Increased muscle spasms, Decreased activity tolerance, Pain, Improper body mechanics, Impaired flexibility, Postural dysfunction, Decreased strength  Visit Diagnosis: Chronic bilateral low back pain without sciatica  Muscle weakness (generalized)     Problem List Patient Active Problem List   Diagnosis Date Noted  . History of renal stone 08/24/2016  . Hypertension 12/21/2010  . Hyperlipidemia with target LDL less than 100 12/21/2010  . History of prostate cancer 12/21/2010    [facility] PTA 09/04/2016, 9:02 AM  [facility] [address]Phone: [phone]  Fax:  [phone] Name: Mario ZINGGMRN: [id number] of Birth: [birth year]

## 2016-09-04 NOTE — Patient Instructions (Signed)

## 2016-09-08 ENCOUNTER — Encounter: Payer: Self-pay | Admitting: Physical Therapy

## 2016-09-08 ENCOUNTER — Ambulatory Visit: Payer: Medicare Other | Admitting: Physical Therapy

## 2016-09-08 DIAGNOSIS — M6281 Muscle weakness (generalized): Secondary | ICD-10-CM | POA: Diagnosis not present

## 2016-09-08 DIAGNOSIS — M545 Low back pain: Principal | ICD-10-CM

## 2016-09-08 DIAGNOSIS — G8929 Other chronic pain: Secondary | ICD-10-CM | POA: Diagnosis not present

## 2016-09-08 NOTE — Therapy (Addendum)
[place], [place], [address] Phone: [phone]   Fax:  [phone]  Physical Therapy Treatment  Patient Details  Name: Mario Garcia MRN: [id number] Date of Birth: 06-08-1931 Referring Provider: Denita Lung, MD  Encounter Date: 09/08/2016      PT End of Session - 09/08/16 1017    Visit Number 2   Number of Visits 9   Date for PT Re-Evaluation 09/29/16   Authorization Type [employer]- KX at visit 15   PT Start Time 1017   PT Stop Time 1106   PT Time Calculation (min) 49 min   Activity Tolerance Patient tolerated treatment well   Behavior During Therapy [facility] for tasks assessed/performed      Past Medical History:  Diagnosis Date  . Cancer ([place])    PROSTATE  . History of colonic polyps   . Hypertension   . Inguinal hernia 03/2002  . Nephrolithiasis     Past Surgical History:  Procedure Laterality Date  . COLONOSCOPY    . PROSTATE SURGERY  10/2005   RADIAL PROSTATECTOMY    There were no vitals filed for this visit.      Subjective Assessment - 09/08/16 1017    Subjective R side QL pain today. Did some gentle yard work yesterday. Driving is very uncomfortable.    Patient Stated Goals return to walking for exercise, decrease pain/fatigue, fishing, work in garden   Currently in Pain? Yes   Pain Score 4    Pain Location Back   Pain Orientation Right;Lower   Pain Descriptors / Indicators Sore   Aggravating Factors  sitting   Pain Relieving Factors walking around                         [facility] Adult PT Treatment/Exercise - 09/08/16 0001      Therapeutic Activites    Other Therapeutic Activities seated posture, abdominal engagement, driving posture     Lumbar Exercises: Stretches   Lower Trunk Rotation Limitations x3 each side 10s holds   Piriformis Stretch Limitations figure 4     Lumbar Exercises: Aerobic   Stationary Bike nu step L6 4 min     Manual Therapy   Soft tissue mobilization IASTM lumbar paraspinals, glut max          Trigger Point Dry Needling - 09/08/16 1534    Consent Given? Yes   Education Handout Provided --  verbal education   Muscles Treated Lower Body Gluteus maximus  lumbar paraspinals              PT Education - 09/08/16 1537    Education provided Yes   Education Details stretches before bed and when waking up, TPDN & expected outcomes, driving posture; return to walking program, DOMS   Person(s) Educated Patient   Methods Explanation;Demonstration;Tactile cues;Verbal cues   Comprehension Verbalized understanding;Returned demonstration;Verbal cues required;Tactile cues required;Need further instruction          PT Short Term Goals - 08/30/16 0849      PT SHORT TERM GOAL #1   Title Pt will verbalize ability to utilize abdominal engagement in various postures to decrease LBP in functional activities by 6/22   Baseline began educating at eval   Time 2   Period Weeks   Status New           PT Long Term Goals - 08/30/16 0849      PT LONG TERM GOAL #1  Title FOTO 68% ability to indicate significant improvement in functional ability by 7/20   Baseline 66% ability at eval   Time 6   Period Weeks   Status New     PT LONG TERM GOAL #2   Title Pt will be able to return to sleeping in his bed rather than the recliner for improved rest without limitation by back pain   Baseline has to sleep in recliner at eval   Time 6   Period Weeks   Status New     PT LONG TERM GOAL #3   Title Hip MMT grossly 5/5 for proper support to lumbopelvic region   Baseline see flowsheet   Time 6   Period Weeks   Status New     PT LONG TERM GOAL #4   Title Pt will be able to return to gardening and fishing as he was before, LBP/end of day fatigue improved 75%   Baseline able with increased pain and significant fatigue at the end of the day   Time 6   Period Weeks   Status New               Plan - 09/08/16  1539    Clinical Impression Statement Pt was able to perform sit to stand without pain following treatment today. Educated on importance of performing exercises to maintain gains made from [facility]. Discussed return to walking program for exercise with his wife and posture while in the car. Pt verbalized understanding.   PT Treatment/Interventions ADLs/Self Care Home Management;Electrical Stimulation;Cryotherapy;Iontophoresis 4mg /ml Dexamethasone;Functional mobility training;Stair training;Gait training;Ultrasound;Traction;Moist Heat;Therapeutic activities;Therapeutic exercise;Balance training;Neuromuscular re-education;Patient/family education;Passive range of motion;Manual techniques;Dry needling;Taping   PT Next Visit Plan TPDN effectiveness? were exercises before bed helpful, abdominal strengthening   PT Home Exercise Plan figure 4, thomas stretch, active hamstring stretch, abdominal engagement,  ADL review when ready   Consulted and Agree with Plan of Care Patient      Patient will benefit from skilled therapeutic intervention in order to improve the following deficits and impairments:  Difficulty walking, Increased muscle spasms, Decreased activity tolerance, Pain, Improper body mechanics, Impaired flexibility, Postural dysfunction, Decreased strength  Visit Diagnosis: Chronic bilateral low back pain without sciatica  Muscle weakness (generalized)     Problem List Patient Active Problem List   Diagnosis Date Noted  . History of renal stone 08/24/2016  . Hypertension 12/21/2010  . Hyperlipidemia with target LDL less than 100 12/21/2010  . History of prostate cancer 12/21/2010    Aadhira Heffernan C. Homero Hyson PT, DPT 09/08/16 4:03 PM   [place] [facility] [address] [place], [place], [address] Phone: [phone]   Fax:  [phone]  Name: SYMON NORWOOD MRN: [id number] Date of Birth: October 23, 1931

## 2016-09-11 ENCOUNTER — Encounter: Payer: Self-pay | Admitting: Physical Therapy

## 2016-09-11 ENCOUNTER — Ambulatory Visit: Payer: Medicare Other | Admitting: Physical Therapy

## 2016-09-11 DIAGNOSIS — M6281 Muscle weakness (generalized): Secondary | ICD-10-CM | POA: Diagnosis not present

## 2016-09-11 DIAGNOSIS — M545 Low back pain: Secondary | ICD-10-CM | POA: Diagnosis not present

## 2016-09-11 DIAGNOSIS — G8929 Other chronic pain: Secondary | ICD-10-CM

## 2016-09-11 NOTE — Therapy (Signed)
[place], [place], [address] Phone: [phone]   Fax:  [phone]  Physical Therapy Treatment  Patient Details  Name: Mario Garcia MRN: [id number] Date of Birth: 1931/07/22 Referring Provider: Denita Lung, MD  Encounter Date: 09/11/2016      PT End of Session - 09/11/16 0801    Visit Number 3   Number of Visits 9   Date for PT Re-Evaluation 09/29/16   Authorization Type [employer]- KX at visit 15   PT Start Time 0801   PT Stop Time 0843   PT Time Calculation (min) 42 min   Activity Tolerance Patient tolerated treatment well   Behavior During Therapy [facility] for tasks assessed/performed      Past Medical History:  Diagnosis Date  . Cancer ([place])    PROSTATE  . History of colonic polyps   . Hypertension   . Inguinal hernia 03/2002  . Nephrolithiasis     Past Surgical History:  Procedure Laterality Date  . COLONOSCOPY    . PROSTATE SURGERY  10/2005   RADIAL PROSTATECTOMY    There were no vitals filed for this visit.      Subjective Assessment - 09/11/16 0802    Subjective pt reports he was able to sleep in his bed Saturday night. Back began to feel like it was tightening yesterday. Needling seems to have been helpful. Riding in car is still painful   Patient Stated Goals return to walking for exercise, decrease pain/fatigue, fishing, work in garden   Currently in Pain? Yes   Pain Score 3    Pain Location Back   Pain Orientation Lower   Pain Descriptors / Indicators Tightness   Aggravating Factors  in car-worse when driving   Pain Relieving Factors moving around                         [facility] Adult PT Treatment/Exercise - 09/11/16 0001      Therapeutic Activites    Other Therapeutic Activities driving posture, use of pillows, core engagement     Lumbar Exercises: Stretches   Passive Hamstring Stretch Limitations supine with green strap   Single Knee to Chest Stretch  Limitations thomas test stretch   Lower Trunk Rotation 2 reps;10 seconds   Quadruped Mid Back Stretch Limitations cat/camel/child pose     Lumbar Exercises: Aerobic   Elliptical 5 min L1 ramp 10     Lumbar Exercises: Seated   Sit to Stand Limitations ball bw knees, cues for glut activation     Lumbar Exercises: Supine   Bridge 15 reps   Bridge Limitations with ball squeeze, in DF   Large Ball Abdominal Isometric 10 reps;5 seconds   Large Ball Abdominal Isometric Limitations hooklying press red physioball   Large Ball Oblique Isometric 10 reps;5 seconds     Lumbar Exercises: Sidelying   Clam 20 reps   Clam Limitations both red tband     Lumbar Exercises: Prone   Opposite Arm/Leg Raise 10 reps;3 seconds;Right arm/Left leg;Left arm/Right leg                PT Education - 09/11/16 0804    Education provided Yes   Education Details exercise form/rationale, HEP          PT Short Term Goals - 08/30/16 0849      PT SHORT TERM GOAL #1   Title Pt will verbalize ability to utilize abdominal engagement in various  postures to decrease LBP in functional activities by 6/22   Baseline began educating at eval   Time 2   Period Weeks   Status New           PT Long Term Goals - 08/30/16 0849      PT LONG TERM GOAL #1   Title FOTO 68% ability to indicate significant improvement in functional ability by 7/20   Baseline 66% ability at eval   Time 6   Period Weeks   Status New     PT LONG TERM GOAL #2   Title Pt will be able to return to sleeping in his bed rather than the recliner for improved rest without limitation by back pain   Baseline has to sleep in recliner at eval   Time 6   Period Weeks   Status New     PT LONG TERM GOAL #3   Title Hip MMT grossly 5/5 for proper support to lumbopelvic region   Baseline see flowsheet   Time 6   Period Weeks   Status New     PT LONG TERM GOAL #4   Title Pt will be able to return to gardening and fishing as he was  before, LBP/end of day fatigue improved 75%   Baseline able with increased pain and significant fatigue at the end of the day   Time [place] - 09/11/16 0844    Clinical Impression Statement focused on retraining core and glut activation today which was difficult but pt was able to stand using gluts rather than back. discussed techniques to use while in car to relieve back pain.    PT Treatment/Interventions ADLs/Self Care Home Management;Electrical Stimulation;Cryotherapy;Iontophoresis 4mg /ml Dexamethasone;Functional mobility training;Stair training;Gait training;Ultrasound;Traction;Moist Heat;Therapeutic activities;Therapeutic exercise;Balance training;Neuromuscular re-education;Patient/family education;Passive range of motion;Manual techniques;Dry needling;Taping   PT Next Visit Plan review sit to stand, glut strengthening   PT Home Exercise Plan figure 4, thomas stretch, active hamstring stretch, abdominal engagement; bridge, clam   Consulted and Agree with Plan of Care Patient      Patient will benefit from skilled therapeutic intervention in order to improve the following deficits and impairments:  Difficulty walking, Increased muscle spasms, Decreased activity tolerance, Pain, Improper body mechanics, Impaired flexibility, Postural dysfunction, Decreased strength  Visit Diagnosis: Chronic bilateral low back pain without sciatica  Muscle weakness (generalized)     Problem List Patient Active Problem List   Diagnosis Date Noted  . History of renal stone 08/24/2016  . Hypertension 12/21/2010  . Hyperlipidemia with target LDL less than 100 12/21/2010  . History of prostate cancer 12/21/2010    Mario Garcia C. Manny Vitolo PT, DPT 09/11/16 8:46 AM   [place] [facility] [address] [place], [place], [address] Phone: [phone]   Fax:  [phone]  Name: Mario Garcia MRN:  [id number] Date of Birth: March 29, 1931

## 2016-09-14 ENCOUNTER — Encounter: Payer: Self-pay | Admitting: Physical Therapy

## 2016-09-14 ENCOUNTER — Ambulatory Visit: Payer: Medicare Other | Admitting: Physical Therapy

## 2016-09-14 DIAGNOSIS — M545 Low back pain, unspecified: Secondary | ICD-10-CM

## 2016-09-14 DIAGNOSIS — G8929 Other chronic pain: Secondary | ICD-10-CM | POA: Diagnosis not present

## 2016-09-14 DIAGNOSIS — M6281 Muscle weakness (generalized): Secondary | ICD-10-CM

## 2016-09-14 NOTE — Therapy (Signed)
[place], [place], [address] Phone: [phone]   Fax:  [phone]  Physical Therapy Treatment  Patient Details  Name: Mario Garcia MRN: [id number] Date of Birth: February 22, 1932 Referring Provider: Denita Lung, MD  Encounter Date: 09/14/2016      PT End of Session - 09/14/16 0802    Visit Number 4   Number of Visits 9   Date for PT Re-Evaluation 09/29/16   Authorization Type [employer]- [place]X at visit 15   PT Start Time 0800   PT Stop Time 0844   PT Time Calculation (min) 44 min   Activity Tolerance Patient tolerated treatment well   Behavior During Therapy [facility] for tasks assessed/performed      Past Medical History:  Diagnosis Date  . Cancer ([place])    PROSTATE  . History of colonic polyps   . Hypertension   . Inguinal hernia 03/2002  . Nephrolithiasis     Past Surgical History:  Procedure Laterality Date  . COLONOSCOPY    . PROSTATE SURGERY  10/2005   RADIAL PROSTATECTOMY    There were no vitals filed for this visit.      Subjective Assessment - 09/14/16 0803    Subjective Did fair with drive to [place]. Using gluts to stand was helpful but forgot sometimes. reports pain at 1/2 /10.   Patient Stated Goals return to walking for exercise, decrease pain/fatigue, fishing, work in garden                         [facility] Adult PT Treatment/Exercise - 09/14/16 0001      Lumbar Exercises: Stretches   Passive Hamstring Stretch Limitations supine with green strap   Single Knee to Chest Stretch Limitations thomas test stretch; gastrroc stretch slant board   Standing Side Bend Limitations QL stretch in door     Lumbar Exercises: Aerobic   Elliptical 5 min L1 ramp 10     Lumbar Exercises: Supine   Bridge Limitations legs on physioball extended   Large Ball Abdominal Isometric Limitations [place] with press of physioball into knee   Other Supine Lumbar Exercises iso hamstring curls  over physioball   Other Supine Lumbar Exercises dead bug extension     Manual Therapy   Soft tissue mobilization QL, glut max          Trigger Point Dry Needling - 09/14/16 0827    Muscles Treated Lower Body Gluteus maximus;Piriformis  quadratus lumborum   Gluteus Maximus Response Twitch response elicited;Palpable increased muscle length   Piriformis Response Twitch response elicited;Palpable increased muscle length              PT Education - 09/14/16 0845    Education provided Yes   Education Details exercise form/rationale, HEP, sit to stand form   Person(s) Educated Patient   Methods Explanation;Demonstration;Tactile cues;Verbal cues;Handout   Comprehension Verbalized understanding;Returned demonstration;Verbal cues required;Tactile cues required;Need further instruction          PT Short Term Goals - 09/14/16 0802      PT SHORT TERM GOAL #1   Title Pt will verbalize ability to utilize abdominal engagement in various postures to decrease LBP in functional activities by 6/22   Baseline forgets sometimes but feels that it helps when he does   Status Achieved           PT Long Term Goals - 08/30/16 0849      PT LONG  TERM GOAL #1   Title FOTO 68% ability to indicate significant improvement in functional ability by 7/20   Baseline 66% ability at eval   Time 6   Period Weeks   Status New     PT LONG TERM GOAL #2   Title Pt will be able to return to sleeping in his bed rather than the recliner for improved rest without limitation by back pain   Baseline has to sleep in recliner at eval   Time 6   Period Weeks   Status New     PT LONG TERM GOAL #3   Title Hip MMT grossly 5/5 for proper support to lumbopelvic region   Baseline see flowsheet   Time 6   Period Weeks   Status New     PT LONG TERM GOAL #4   Title Pt will be able to return to gardening and fishing as he was before, LBP/end of day fatigue improved 75%   Baseline able with increased pain  and significant fatigue at the end of the day   Time 6   Period Weeks   Status New               Plan - 09/14/16 7062    Clinical Impression Statement Challenged core strength to decrease demand on QL and added stretch for QL in door today. Pt reported mild "feeling" in R QL upon standing after treatment.    PT Treatment/Interventions ADLs/Self Care Home Management;Electrical Stimulation;Cryotherapy;Iontophoresis 4mg /ml Dexamethasone;Functional mobility training;Stair training;Gait training;Ultrasound;Traction;Moist Heat;Therapeutic activities;Therapeutic exercise;Balance training;Neuromuscular re-education;Patient/family education;Passive range of motion;Manual techniques;Dry needling;Taping   PT Next Visit Plan core, glut strength   PT Home Exercise Plan figure 4, thomas stretch, active hamstring stretch, abdominal engagement; bridge, clam; dead bug extension, LTR, door QL stretch   Consulted and Agree with Plan of Care Patient      Patient will benefit from skilled therapeutic intervention in order to improve the following deficits and impairments:  Difficulty walking, Increased muscle spasms, Decreased activity tolerance, Pain, Improper body mechanics, Impaired flexibility, Postural dysfunction, Decreased strength  Visit Diagnosis: Chronic bilateral low back pain without sciatica  Muscle weakness (generalized)     Problem List Patient Active Problem List   Diagnosis Date Noted  . History of renal stone 08/24/2016  . Hypertension 12/21/2010  . Hyperlipidemia with target LDL less than 100 12/21/2010  . History of prostate cancer 12/21/2010    Ladasha Schnackenberg C. Liyah Higham PT, DPT 09/14/16 9:27 AM   [place] [facility] [address] [place], [place], [address] Phone: [phone]   Fax:  [phone]  Name: DEMARCO BACCI MRN: [id number] Date of Birth: 1931-10-07

## 2016-09-18 ENCOUNTER — Encounter: Payer: Self-pay | Admitting: Physical Therapy

## 2016-09-18 ENCOUNTER — Ambulatory Visit: Payer: Medicare Other | Admitting: Physical Therapy

## 2016-09-18 DIAGNOSIS — G8929 Other chronic pain: Secondary | ICD-10-CM | POA: Diagnosis not present

## 2016-09-18 DIAGNOSIS — M6281 Muscle weakness (generalized): Secondary | ICD-10-CM

## 2016-09-18 DIAGNOSIS — M545 Low back pain: Secondary | ICD-10-CM | POA: Diagnosis not present

## 2016-09-18 NOTE — Therapy (Signed)
Mario Garcia, [place], [address] Phone: [phone]   Fax:  [phone]  Physical Therapy Treatment  Patient Details  Name: Mario Garcia MRN: [id number] Date of Birth: 03-08-1932 Referring Provider: Denita Lung, MD  Encounter Date: 09/18/2016      Garcia End of Session - 09/18/16 0757    Visit Number 5   Number of Visits 9   Date for Garcia Re-Evaluation 09/29/16   Authorization Type [employer]- KX at visit 15   Garcia Start Time 0800   Garcia Stop Time 0841   Garcia Time Calculation (min) 41 min   Activity Tolerance Patient tolerated treatment well   Behavior During Therapy [facility] for tasks assessed/performed      Past Medical History:  Diagnosis Date  . Cancer (Mario Garcia)    PROSTATE  . History of colonic polyps   . Hypertension   . Inguinal hernia 03/2002  . Nephrolithiasis     Past Surgical History:  Procedure Laterality Date  . COLONOSCOPY    . PROSTATE SURGERY  10/2005   RADIAL PROSTATECTOMY    There were no vitals filed for this visit.      Subjective Assessment - 09/18/16 0800    Subjective Had a good day Saturday, yesterday was not a good day. Pain 2-4/10 all day yesterday, decreased with ibuprofen and was able to sleep in bed. Did use weed eater on Sat. A little bit of movement pain today-maybe just soreness.    Patient Stated Goals return to walking for exercise, decrease pain/fatigue, fishing, work in garden                         [facility] Adult Garcia Treatment/Exercise - 09/18/16 0001      Lumbar Exercises: Stretches   Active Hamstring Stretch Limitations supine 5x each   Standing Side Bend Limitations QL stretch in door   Piriformis Stretch Limitations figure 4     Lumbar Exercises: Aerobic   Elliptical 5 min L1 ramp 10     Lumbar Exercises: Seated   LAQ on Ball Limitations on physioball: UE diagonals, bouncing, marching     Lumbar Exercises: Supine   Bent Knee Raise Limitations  single leg press into green theraband   Other Supine Lumbar Exercises dead bug ext x10 each 2# weight in hand     Lumbar Exercises: Prone   Other Prone Lumbar Exercises physioball roll outs knees & elbows                Garcia Education - 09/18/16 0803    Education provided Yes   Education Details exercise form/rationale,    Person(s) Educated Patient   Methods Explanation;Demonstration;Tactile cues;Verbal cues   Comprehension Verbalized understanding;Returned demonstration;Verbal cues required;Tactile cues required;Need further instruction          Garcia Short Term Goals - 09/14/16 0802      Garcia SHORT TERM GOAL #1   Title Garcia will verbalize ability to utilize abdominal engagement in various postures to decrease LBP in functional activities by 6/22   Baseline forgets sometimes but feels that it helps when he does   Status Achieved           Garcia Long Term Goals - 08/30/16 0849      Garcia LONG TERM GOAL #1   Title FOTO 68% ability to indicate significant improvement in functional ability by 7/20   Baseline 66% ability at eval   Time 6  Period Weeks   Status New     Garcia LONG TERM GOAL #2   Title Garcia will be able to return to sleeping in his bed rather than the recliner for improved rest without limitation by back pain   Baseline has to sleep in recliner at eval   Time 6   Period Weeks   Status New     Garcia LONG TERM GOAL #3   Title Hip MMT grossly 5/5 for proper support to lumbopelvic region   Baseline see flowsheet   Time 6   Period Weeks   Status New     Garcia LONG TERM GOAL #4   Title Garcia will be able to return to gardening and fishing as he was before, LBP/end of day fatigue improved 75%   Baseline able with increased pain and significant fatigue at the end of the day   Time 6   Period Weeks   Status New               Plan - 09/18/16 0841    Clinical Impression Statement Continued to increase core challenges, Garcia purchased a physioball at home which was  utilized for exercises today. Discussed building core program for home and toward the end of his plan we will point out the top most important exercises. Garcia denied pain in his back today following treatment.    Garcia Treatment/Interventions ADLs/Self Care Home Management;Electrical Stimulation;Cryotherapy;Iontophoresis 4mg /ml Dexamethasone;Functional mobility training;Stair training;Gait training;Ultrasound;Traction;Moist Heat;Therapeutic activities;Therapeutic exercise;Balance training;Neuromuscular re-education;Patient/family education;Passive range of motion;Manual techniques;Dry needling;Taping   Garcia Next Visit Plan core, glut strength   Garcia Home Exercise Plan figure 4, thomas stretch, active hamstring stretch, abdominal engagement; bridge, clam; dead bug extension, LTR, door QL stretch; physioball: lean back, bounce, UE diagonals, march, plank roll outs;    Consulted and Agree with Plan of Care Patient      Patient will benefit from skilled therapeutic intervention in order to improve the following deficits and impairments:  Difficulty walking, Increased muscle spasms, Decreased activity tolerance, Pain, Improper body mechanics, Impaired flexibility, Postural dysfunction, Decreased strength  Visit Diagnosis: Chronic bilateral low back pain without sciatica  Muscle weakness (generalized)     Problem List Patient Active Problem List   Diagnosis Date Noted  . History of renal stone 08/24/2016  . Hypertension 12/21/2010  . Hyperlipidemia with target LDL less than 100 12/21/2010  . History of prostate cancer 12/21/2010   Yeimy Brabant C. Mario Garcia, DPT 09/18/16 8:43 AM   [facility] [address] [place], [place], [address] Phone: [phone]   Fax:  [phone]  Name: [name] MRN: [id number] Date of Birth: Dec 09, 1931

## 2016-09-21 ENCOUNTER — Ambulatory Visit: Payer: Medicare Other | Admitting: Physical Therapy

## 2016-09-21 ENCOUNTER — Encounter: Payer: Self-pay | Admitting: Physical Therapy

## 2016-09-21 DIAGNOSIS — G8929 Other chronic pain: Secondary | ICD-10-CM | POA: Diagnosis not present

## 2016-09-21 DIAGNOSIS — M6281 Muscle weakness (generalized): Secondary | ICD-10-CM | POA: Diagnosis not present

## 2016-09-21 DIAGNOSIS — M545 Low back pain: Secondary | ICD-10-CM | POA: Diagnosis not present

## 2016-09-21 NOTE — Therapy (Signed)
[place], [place], [address] Phone: [phone]   Fax:  [phone]  Physical Therapy Treatment  Patient Details  Name: Mario Garcia MRN: [id number] Date of Birth: [birth year] Referring Provider: Denita Lung, MD  Encounter Date: 09/21/2016      PT End of Session - 09/21/16 1045    Visit Number 6   Number of Visits 9   Date for PT Re-Evaluation 09/29/16   PT Start Time 0800   PT Stop Time 0845   PT Time Calculation (min) 45 min   Activity Tolerance Patient tolerated treatment well   Behavior During Therapy [facility] for tasks assessed/performed      Past Medical History:  Diagnosis Date  . Cancer ([place])    PROSTATE  . History of colonic polyps   . Hypertension   . Inguinal hernia 03/2002  . Nephrolithiasis     Past Surgical History:  Procedure Laterality Date  . COLONOSCOPY    . PROSTATE SURGERY  10/2005   RADIAL PROSTATECTOMY    There were no vitals filed for this visit.      Subjective Assessment - 09/21/16 0803    Subjective I had no pain until I got up this morning.  i helped a neighbor change the belt on his lawnmower.  Woke up sore.     Pain Score 2    Pain Location Back   Pain Orientation Right   Pain Frequency Intermittent   Aggravating Factors  driving   Pain Relieving Factors moving around                         [facility] Adult PT Treatment/Exercise - 09/21/16 0001      Therapeutic Activites    Other Therapeutic Activities Driving posture.  Went to Vehicle in parking lot to see what modifications were needed.  Several things tried with sest forward back sitting on pillow, pillow behind.  Memory foam pillow did not offer support .  Rolled towel firmly placed in lumbar area with cues to scoot hips to seat prior to placing seemed to work best. Patient to try on the way home.      Lumbar Exercises: Aerobic   Elliptical 5 minutes,  L1, ramp 8     Lumbar  Exercises: Seated   LAQ on Ball Limitations on physioball: UE diagonals, bouncing, marching  bounce and reach,  pelvic mobility side to side.  Arch and s   Hip Flexion on Ball --  march, bounce, bounce and march, march and reach     Lumbar Exercises: Supine   Bridge Limitations 1 set legs on physioball extended15 X,  1 set fee on mat, 10 x   Large Ball Abdominal Isometric Limitations 10     Lumbar Exercises: Sidelying   Clam 20 reps   Clam Limitations red band, issued for home     Lumbar Exercises: Prone   Other Prone Lumbar Exercises physioball roll outs knees & elbows  difficult     Lumbar Exercises: Quadruped   Single Arm Raise 10 reps   Straight Leg Raise 10 reps   Opposite Arm/Leg Raise 10 reps   Opposite Arm/Leg Raise Limitations wobbles                PT Education - 09/21/16 1045    Education provided Yes   Education Details car sitting   Person(s) Educated Patient   Methods Explanation;Verbal cues;Tactile cues   Comprehension  Verbalized understanding;Returned demonstration          PT Short Term Goals - 09/21/16 1047      PT SHORT TERM GOAL #1   Title Pt will verbalize ability to utilize abdominal engagement in various postures to decrease LBP in functional activities by 6/22   Time 2   Period Weeks   Status Achieved           PT Long Term Goals - 09/21/16 1047      PT LONG TERM GOAL #1   Title FOTO 68% ability to indicate significant improvement in functional ability by 7/20   Time 6   Period Weeks   Status Unable to assess     PT LONG TERM GOAL #2   Title Pt will be able to return to sleeping in his bed rather than the recliner for improved rest without limitation by back pain   Baseline back in his bed consistantly   Time 6   Period Weeks   Status Achieved     PT LONG TERM GOAL #3   Title Hip MMT grossly 5/5 for proper support to lumbopelvic region   Time 6   Period Weeks   Status Unable to assess     PT LONG TERM GOAL #4    Title Pt will be able to return to gardening and fishing as he was before, LBP/end of day fatigue improved 75%   Time 6   Period Weeks   Status Unable to assess               Plan - 09/21/16 1045    Clinical Impression Statement Driving has been one of his biggest pains.  Part of session spent car with practicing various modifications.  Rlooed towel worked best.  he is compliant with his HEP   PT Treatment/Interventions ADLs/Self Care Home Management;Electrical Stimulation;Cryotherapy;Iontophoresis 4mg /ml Dexamethasone;Functional mobility training;Stair training;Gait training;Ultrasound;Traction;Moist Heat;Therapeutic activities;Therapeutic exercise;Balance training;Neuromuscular re-education;Patient/family education;Passive range of motion;Manual techniques;Dry needling;Taping   PT Next Visit Plan core, glut strength   PT Home Exercise Plan figure 4, thomas stretch, active hamstring stretch, abdominal engagement; bridge, clam; dead bug extension, LTR, door QL stretch; physioball: lean back, bounce, UE diagonals, march, plank roll outs;    Consulted and Agree with Plan of Care Patient      Patient will benefit from skilled therapeutic intervention in order to improve the following deficits and impairments:  Difficulty walking, Increased muscle spasms, Decreased activity tolerance, Pain, Improper body mechanics, Impaired flexibility, Postural dysfunction, Decreased strength  Visit Diagnosis: Chronic bilateral low back pain without sciatica  Muscle weakness (generalized)     Problem List Patient Active Problem List   Diagnosis Date Noted  . History of renal stone 08/24/2016  . Hypertension 12/21/2010  . Hyperlipidemia with target LDL less than 100 12/21/2010  . History of prostate cancer 12/21/2010    [facility] PTA 09/21/2016, 10:48 AM  [facility] [address] [place], [place], [address] Phone: [phone]   Fax:   [phone]  Name: Mario Garcia MRN: [id number] Date of Birth: August 31, 1931

## 2016-09-25 ENCOUNTER — Ambulatory Visit: Payer: Medicare Other | Attending: Family Medicine | Admitting: Physical Therapy

## 2016-09-25 ENCOUNTER — Encounter: Payer: Self-pay | Admitting: Physical Therapy

## 2016-09-25 DIAGNOSIS — M545 Low back pain: Secondary | ICD-10-CM | POA: Insufficient documentation

## 2016-09-25 DIAGNOSIS — G8929 Other chronic pain: Secondary | ICD-10-CM | POA: Insufficient documentation

## 2016-09-25 DIAGNOSIS — M6281 Muscle weakness (generalized): Secondary | ICD-10-CM | POA: Insufficient documentation

## 2016-09-25 NOTE — Therapy (Signed)
[place], [place], [address] Phone: [phone]   Fax:  [phone]  Physical Therapy Treatment  Patient Details  Name: Mario Garcia MRN: [id number] Date of Birth: 10-30-1931 Referring Provider: Denita Lung, MD  Encounter Date: 09/25/2016      PT End of Session - 09/25/16 0856    Visit Number 7   Number of Visits 9   Date for PT Re-Evaluation 09/29/16   PT Start Time 0802   PT Stop Time 0842   PT Time Calculation (min) 40 min   Activity Tolerance Patient tolerated treatment well   Behavior During Therapy Mario Garcia for tasks assessed/performed      Past Medical History:  Diagnosis Date  . Cancer ([place])    PROSTATE  . History of colonic polyps   . Hypertension   . Inguinal hernia 03/2002  . Nephrolithiasis     Past Surgical History:  Procedure Laterality Date  . COLONOSCOPY    . PROSTATE SURGERY  10/2005   RADIAL PROSTATECTOMY    There were no vitals filed for this visit.      Subjective Assessment - 09/25/16 0806    Subjective 1/10.  I have not had ibuprophren in 36 hours.  I can sleep in the bed now.  It is now intermittant   Currently in Pain? Yes   Pain Score 1    Pain Location Back   Pain Orientation Right   Pain Descriptors / Indicators Tightness  mild pain   Pain Type Chronic pain   Pain Frequency Intermittent   Aggravating Factors  driving, turning over in bed, quick movements when I am not sleeping   Pain Relieving Factors exercises,                         [facility] Adult PT Treatment/Exercise - 09/25/16 0001      Therapeutic Activites    Other Therapeutic Activities simulated picking up rods from boat bottom,  cued to use pressure on opposite knee with hand to decrease pain     Lumbar Exercises: Stretches   Lower Trunk Rotation 5 reps   Lower Trunk Rotation Limitations pin initially then it eased     Lumbar Exercises: Machines for Strengthening   Cybex  Knee Flexion 20,25,35 LBS 10 X each   Leg Press 1-4 plates 10 X each monitored     Lumbar Exercises: Standing   Heel Raises 20 reps   Heel Raises Limitations single lifts,  smaller heights at end  feels weaker left.uses hand    Wall Slides 10 reps  3-5 seconds   Row 10 reps  2 sets 10,  17 LBS each     Lumbar Exercises: Seated   Sit to Stand 5 reps   Sit to Stand Limitations pain free     Lumbar Exercises: Supine   Ab Set 5 reps     Knee/Hip Exercises: Stretches   Gastroc Stretch 3 reps;30 seconds   Gastroc Stretch Limitations incline board                PT Education - 09/25/16 0847    Education provided Yes   Education Details Boat activities ADL   Person(s) Educated Patient   Methods Explanation;Demonstration;Verbal cues   Comprehension Verbalized understanding;Returned demonstration          PT Short Term Goals - 09/21/16 1047      PT SHORT TERM GOAL #1   Title  Pt will verbalize ability to utilize abdominal engagement in various postures to decrease LBP in functional activities by 6/22   Time 2   Period Weeks   Status Achieved           PT Long Term Goals - 09/25/16 0827      PT LONG TERM GOAL #1   Title FOTO 68% ability to indicate significant improvement in functional ability by 7/20   Time 6   Period Weeks   Status Unable to assess     PT LONG TERM GOAL #2   Title Pt will be able to return to sleeping in his bed rather than the recliner for improved rest without limitation by back pain   Status Achieved     PT LONG TERM GOAL #3   Title Hip MMT grossly 5/5 for proper support to lumbopelvic region   Time 6   Period Weeks   Status Unable to assess     PT LONG TERM GOAL #4   Title Pt will be able to return to gardening and fishing as he was before, LBP/end of day fatigue improved 75%   Baseline too hot to fish,  did a little gardening, carrying beans and peas.   Time 6   Period Weeks   Status On-going               Plan -  09/25/16 4503    Clinical Impression Statement Patient has seen a lot of improvements with PT. He was able to simulate fishing activities with 1-2 LB weight with out pain after cues.   PT Treatment/Interventions ADLs/Self Care Home Management;Electrical Stimulation;Cryotherapy;Iontophoresis 4mg /ml Dexamethasone;Functional mobility training;Stair training;Gait training;Ultrasound;Traction;Moist Heat;Therapeutic activities;Therapeutic exercise;Balance training;Neuromuscular re-education;Patient/family education;Passive range of motion;Manual techniques;Dry needling;Taping   PT Next Visit Plan core, glut strength.  POC soon ends   PT Home Exercise Plan figure 4, thomas stretch, active hamstring stretch, abdominal engagement; bridge, clam; dead bug extension, LTR, door QL stretch; physioball: lean back, bounce, UE diagonals, march, plank roll outs;    Consulted and Agree with Plan of Care Patient      Patient will benefit from skilled therapeutic intervention in order to improve the following deficits and impairments:  Difficulty walking, Increased muscle spasms, Decreased activity tolerance, Pain, Improper body mechanics, Impaired flexibility, Postural dysfunction, Decreased strength  Visit Diagnosis: Chronic bilateral low back pain without sciatica  Muscle weakness (generalized)     Problem List Patient Active Problem List   Diagnosis Date Noted  . History of renal stone 08/24/2016  . Hypertension 12/21/2010  . Hyperlipidemia with target LDL less than 100 12/21/2010  . History of prostate cancer 12/21/2010    Mario Garcia 09/25/2016, 9:02 AM  [facility] [address] [place], [place], [address] Phone: [phone]   Fax:  [phone]  Name: Mario Garcia MRN: [id number] Date of Birth: [birth year]

## 2016-09-28 ENCOUNTER — Encounter: Payer: Self-pay | Admitting: Physical Therapy

## 2016-09-28 ENCOUNTER — Ambulatory Visit: Payer: Medicare Other | Admitting: Physical Therapy

## 2016-09-28 DIAGNOSIS — M545 Low back pain, unspecified: Secondary | ICD-10-CM

## 2016-09-28 DIAGNOSIS — G8929 Other chronic pain: Secondary | ICD-10-CM

## 2016-09-28 DIAGNOSIS — M6281 Muscle weakness (generalized): Secondary | ICD-10-CM | POA: Diagnosis not present

## 2016-09-28 NOTE — Therapy (Signed)
[place], [place], [address] Phone: [phone]   Fax:  [phone]  Physical Therapy Treatment/Discharge Summary  Patient Details  Name: Mario Garcia MRN: [id number] Date of Birth: 1931/09/04 Referring Provider: Denita Lung, MD  Encounter Date: 09/28/2016      PT End of Session - 09/28/16 0756    Visit Number 8   Number of Visits 9   Date for PT Re-Evaluation 09/29/16   Authorization Type [employer]- KX at visit 15   PT Start Time 0800   PT Stop Time 0842   PT Time Calculation (min) 42 min   Activity Tolerance Patient tolerated treatment well   Behavior During Therapy [facility] for tasks assessed/performed      Past Medical History:  Diagnosis Date  . Cancer ([employer])    PROSTATE  . History of colonic polyps   . Hypertension   . Inguinal hernia 03/2002  . Nephrolithiasis     Past Surgical History:  Procedure Laterality Date  . COLONOSCOPY    . PROSTATE SURGERY  10/2005   RADIAL PROSTATECTOMY    There were no vitals filed for this visit.      Subjective Assessment - 09/28/16 0800    Subjective Still occasionally have that one spot            [facility] PT Assessment - 09/28/16 0001      Strength   Right Hip Flexion 5/5   Right Hip Extension 4+/5   Right Hip ABduction 5/5   Left Hip Flexion 5/5   Left Hip Extension 4+/5   Left Hip ABduction 5/5                     [facility] Adult PT Treatment/Exercise - 09/28/16 0001      Exercises   Exercises Other Exercises   Other Exercises  reviewed HEP exercises listed in "Plan"                PT Education - 09/28/16 0843    Education provided Yes   Education Details return to walking program, taking breaks during activities known to be hard on back, HEP, importance of continued exercises & body awareness, exercise form/rationale; building endurance, transitioning work of musculature to decrease poor biomechanical chain use     Person(s) Educated Patient   Methods Explanation;Demonstration;Tactile cues;Verbal cues   Comprehension Verbalized understanding;Returned demonstration;Verbal cues required;Tactile cues required;Need further instruction          PT Short Term Goals - 09/21/16 1047      PT SHORT TERM GOAL #1   Title Pt will verbalize ability to utilize abdominal engagement in various postures to decrease LBP in functional activities by 6/22   Time 2   Period Weeks   Status Achieved           PT Long Term Goals - 09/28/16 0805      PT LONG TERM GOAL #1   Title FOTO 68% ability to indicate significant improvement in functional ability by 7/20   Baseline 70% ability   Status Achieved     PT LONG TERM GOAL #2   Title Pt will be able to return to sleeping in his bed rather than the recliner for improved rest without limitation by back pain   Baseline back in his bed consistantly   Status Achieved     PT LONG TERM GOAL #3   Title Hip MMT grossly 5/5 for proper support to lumbopelvic region  Baseline see flowsheet   Status Partially Met     PT LONG TERM GOAL #4   Title Pt will be able to return to gardening and fishing as he was before, LBP/end of day fatigue improved 75%   Baseline does not feel limited by pain   Status Achieved               Plan - 2016-10-18 0846    Clinical Impression Statement Pt has made significant progress in PT and is being d/c to independent program. Pt verbalized comfort and understanding with long term HEP and was instructed to contact us with any further questions.    PT Treatment/Interventions ADLs/Self Care Home Management;Electrical Stimulation;Cryotherapy;Iontophoresis 72m/ml Dexamethasone;Functional mobility training;Stair training;Gait training;Ultrasound;Traction;Moist Heat;Therapeutic activities;Therapeutic exercise;Balance training;Neuromuscular re-education;Patient/family education;Passive range of motion;Manual techniques;Dry needling;Taping   PT  Home Exercise Plan figure 4, thomas stretch, active hamstring stretch, abdominal engagement; bridge, clam; dead bug extension, LTR, door QL stretch; physioball: lean back, bounce, UE diagonals, march, plank roll outs;    Consulted and Agree with Plan of Care Patient      Patient will benefit from skilled therapeutic intervention in order to improve the following deficits and impairments:  Difficulty walking, Increased muscle spasms, Decreased activity tolerance, Pain, Improper body mechanics, Impaired flexibility, Postural dysfunction, Decreased strength  Visit Diagnosis: Chronic bilateral low back pain without sciatica  Muscle weakness (generalized)       G-Codes - 007/25/20180845    Functional Assessment Tool Used (Outpatient Only) FOTO 70% ability, clinical judgement   Functional Limitation Mobility: Walking and moving around   Mobility: Walking and Moving Around Goal Status [phone] At least 1 percent but less than 20 percent impaired, limited or restricted   Mobility: Walking and Moving Around Discharge Status [phone] At least 1 percent but less than 20 percent impaired, limited or restricted      Problem List Patient Active Problem List   Diagnosis Date Noted  . History of renal stone 08/24/2016  . Hypertension 12/21/2010  . Hyperlipidemia with target LDL less than 100 12/21/2010  . History of prostate cancer 12/21/2010  PHYSICAL THERAPY DISCHARGE SUMMARY  Visits from Start of Care: 8  Current functional level related to goals / functional outcomes: See above   Remaining deficits: See above   Education / Equipment: Anatomy of condition, POC, HEP, exercise form/rationale  Plan: Patient agrees to discharge.  Patient goals were met. Patient is being discharged due to meeting the stated rehab goals.  ?????    Genevieve Ritzel C. Lakeya Mulka PT, DPT [id number] AM    [facility]14[address]  [address]Phone: [phone]  Fax:  [phone] Name: Mario MOYANOMRN: [id number] of Birth: 5Jun 20, 1933

## 2016-12-06 DIAGNOSIS — L820 Inflamed seborrheic keratosis: Secondary | ICD-10-CM | POA: Diagnosis not present

## 2016-12-06 DIAGNOSIS — X32XXXA Exposure to sunlight, initial encounter: Secondary | ICD-10-CM | POA: Diagnosis not present

## 2016-12-06 DIAGNOSIS — L570 Actinic keratosis: Secondary | ICD-10-CM | POA: Diagnosis not present

## 2016-12-18 ENCOUNTER — Other Ambulatory Visit (INDEPENDENT_AMBULATORY_CARE_PROVIDER_SITE_OTHER): Payer: Medicare Other

## 2016-12-18 DIAGNOSIS — Z23 Encounter for immunization: Secondary | ICD-10-CM

## 2017-06-08 DIAGNOSIS — H40052 Ocular hypertension, left eye: Secondary | ICD-10-CM | POA: Diagnosis not present

## 2017-06-08 DIAGNOSIS — H02054 Trichiasis without entropian left upper eyelid: Secondary | ICD-10-CM | POA: Diagnosis not present

## 2017-06-08 DIAGNOSIS — H04123 Dry eye syndrome of bilateral lacrimal glands: Secondary | ICD-10-CM | POA: Diagnosis not present

## 2017-06-08 DIAGNOSIS — H16223 Keratoconjunctivitis sicca, not specified as Sjogren's, bilateral: Secondary | ICD-10-CM | POA: Diagnosis not present

## 2017-06-08 DIAGNOSIS — H2513 Age-related nuclear cataract, bilateral: Secondary | ICD-10-CM | POA: Diagnosis not present

## 2017-06-22 DIAGNOSIS — H40013 Open angle with borderline findings, low risk, bilateral: Secondary | ICD-10-CM | POA: Diagnosis not present

## 2017-06-22 DIAGNOSIS — H35033 Hypertensive retinopathy, bilateral: Secondary | ICD-10-CM | POA: Diagnosis not present

## 2017-06-22 DIAGNOSIS — H25043 Posterior subcapsular polar age-related cataract, bilateral: Secondary | ICD-10-CM | POA: Diagnosis not present

## 2017-06-22 DIAGNOSIS — H2513 Age-related nuclear cataract, bilateral: Secondary | ICD-10-CM | POA: Diagnosis not present

## 2017-07-12 DIAGNOSIS — H40011 Open angle with borderline findings, low risk, right eye: Secondary | ICD-10-CM | POA: Diagnosis not present

## 2017-07-12 DIAGNOSIS — H401122 Primary open-angle glaucoma, left eye, moderate stage: Secondary | ICD-10-CM | POA: Diagnosis not present

## 2017-07-12 DIAGNOSIS — H16223 Keratoconjunctivitis sicca, not specified as Sjogren's, bilateral: Secondary | ICD-10-CM | POA: Diagnosis not present

## 2017-07-12 DIAGNOSIS — H40053 Ocular hypertension, bilateral: Secondary | ICD-10-CM | POA: Diagnosis not present

## 2017-08-08 DIAGNOSIS — H401122 Primary open-angle glaucoma, left eye, moderate stage: Secondary | ICD-10-CM | POA: Diagnosis not present

## 2017-08-08 DIAGNOSIS — H40011 Open angle with borderline findings, low risk, right eye: Secondary | ICD-10-CM | POA: Diagnosis not present

## 2017-08-08 DIAGNOSIS — H16223 Keratoconjunctivitis sicca, not specified as Sjogren's, bilateral: Secondary | ICD-10-CM | POA: Diagnosis not present

## 2017-08-08 DIAGNOSIS — H40052 Ocular hypertension, left eye: Secondary | ICD-10-CM | POA: Diagnosis not present

## 2017-09-03 DIAGNOSIS — H40011 Open angle with borderline findings, low risk, right eye: Secondary | ICD-10-CM | POA: Diagnosis not present

## 2017-09-17 DIAGNOSIS — H401122 Primary open-angle glaucoma, left eye, moderate stage: Secondary | ICD-10-CM | POA: Diagnosis not present

## 2017-10-03 DIAGNOSIS — H401122 Primary open-angle glaucoma, left eye, moderate stage: Secondary | ICD-10-CM | POA: Diagnosis not present

## 2017-10-03 DIAGNOSIS — H40052 Ocular hypertension, left eye: Secondary | ICD-10-CM | POA: Diagnosis not present

## 2017-10-03 DIAGNOSIS — H40011 Open angle with borderline findings, low risk, right eye: Secondary | ICD-10-CM | POA: Diagnosis not present

## 2017-10-03 DIAGNOSIS — H16223 Keratoconjunctivitis sicca, not specified as Sjogren's, bilateral: Secondary | ICD-10-CM | POA: Diagnosis not present

## 2017-10-08 ENCOUNTER — Other Ambulatory Visit: Payer: Self-pay | Admitting: Family Medicine

## 2017-10-08 DIAGNOSIS — I10 Essential (primary) hypertension: Secondary | ICD-10-CM

## 2017-10-08 DIAGNOSIS — E785 Hyperlipidemia, unspecified: Secondary | ICD-10-CM

## 2017-10-16 ENCOUNTER — Telehealth: Payer: Self-pay

## 2017-10-16 NOTE — Telephone Encounter (Signed)
yours

## 2017-10-16 NOTE — Telephone Encounter (Addendum)
Losartan Hydrochlorothiazide is currently on back order so you will have to sent to separate medications. One for Losartan and one for Hydrochlorothiazide. Please advise

## 2017-10-17 MED ORDER — HYDROCHLOROTHIAZIDE 12.5 MG PO CAPS: 12.5000 mg | ORAL_CAPSULE | Freq: Every day | ORAL | 0 refills | Status: DC

## 2017-10-17 MED ORDER — LOSARTAN POTASSIUM 50 MG PO TABS: 50.0000 mg | ORAL_TABLET | Freq: Every day | ORAL | 0 refills | Status: DC

## 2017-10-17 NOTE — Telephone Encounter (Signed)
I gave him a 90-day supply.  Hopefully by then we can switch him back to the 1 pill dosing regimen

## 2017-10-17 NOTE — Telephone Encounter (Signed)
Pt was given info about med and he said he will go pick it up today [facility]

## 2017-11-16 ENCOUNTER — Encounter: Payer: Self-pay | Admitting: Family Medicine

## 2017-11-16 ENCOUNTER — Ambulatory Visit (INDEPENDENT_AMBULATORY_CARE_PROVIDER_SITE_OTHER): Payer: Medicare Other | Admitting: Family Medicine

## 2017-11-16 VITALS — BP 128/72 | HR 53 | Temp 98.1°F | Wt 172.0 lb

## 2017-11-16 DIAGNOSIS — H9113 Presbycusis, bilateral: Secondary | ICD-10-CM

## 2017-11-16 DIAGNOSIS — Z87442 Personal history of urinary calculi: Secondary | ICD-10-CM | POA: Diagnosis not present

## 2017-11-16 DIAGNOSIS — E785 Hyperlipidemia, unspecified: Secondary | ICD-10-CM

## 2017-11-16 DIAGNOSIS — I10 Essential (primary) hypertension: Secondary | ICD-10-CM | POA: Diagnosis not present

## 2017-11-16 DIAGNOSIS — Z8546 Personal history of malignant neoplasm of prostate: Secondary | ICD-10-CM | POA: Diagnosis not present

## 2017-11-16 NOTE — Progress Notes (Signed)
   Subjective:    Patient ID: Mario Garcia, male    DOB: [birth year], 84 y.o.   MRN: [id number]  HPI He is here for a med check appointment.  He does have hypertension and presently is on Hyzaar.  He is having no difficulty with that.  He is also taking Pravachol for his lipids.  He does have a previous history of prostate cancer and has had a radical prostatectomy.  Also has remote history of kidney stones but none recently.  Do.  Es complain of decreased hearing   Review of Systems     Objective:   Physical Exam Alert and in no distress. Tympanic membranes and canals are normal. Pharyngeal area is normal. Neck is supple without adenopathy or thyromegaly. Cardiac exam shows a regular sinus rhythm without murmurs or gallops. Lungs are clear to auscultation. Hearing test does show high-frequency hearing loss.       Assessment & Plan:  Essential hypertension - Plan: Comprehensive metabolic panel, CBC with Differential/Platelet  History of prostate cancer - Plan: PSA  History of renal stone - Plan: Comprehensive metabolic panel, CBC with Differential/Platelet  Hyperlipidemia with target LDL less than 100 - Plan: Lipid panel  Presbycusis of both ears - Plan: Ambulatory referral to Audiology, Visual acuity screening Overall he seems to be doing quite nicely.  I will continue him on his present medication regimen and have him follow-up with audiology for getting hearing aids.

## 2017-11-17 LAB — COMPREHENSIVE METABOLIC PANEL
A/G RATIO: 1.7 (ref 1.2–2.2)
ALBUMIN: 4.5 g/dL (ref 3.5–4.7)
ALK PHOS: 65 IU/L (ref 39–117)
ALT: 22 IU/L (ref 0–44)
AST: 23 IU/L (ref 0–40)
BUN / CREAT RATIO: 20 (ref 10–24)
BUN: 17 mg/dL (ref 8–27)
Bilirubin Total: 0.4 mg/dL (ref 0.0–1.2)
CO2: 25 mmol/L (ref 20–29)
CREATININE: 0.83 mg/dL (ref 0.76–1.27)
Calcium: 9.3 mg/dL (ref 8.6–10.2)
Chloride: 99 mmol/L (ref 96–106)
GFR calc Af Amer: 93 mL/min/{1.73_m2} (ref >59)
GFR calc non Af Amer: 81 mL/min/{1.73_m2} (ref >59)
GLOBULIN, TOTAL: 2.7 g/dL (ref 1.5–4.5)
Glucose: 87 mg/dL (ref 65–99)
Potassium: 4.5 mmol/L (ref 3.5–5.2)
SODIUM: 140 mmol/L (ref 134–144)
Total Protein: 7.2 g/dL (ref 6.0–8.5)

## 2017-11-17 LAB — CBC WITH DIFFERENTIAL/PLATELET
BASOS ABS: 0 10*3/uL (ref 0.0–0.2)
BASOS: 0 %
EOS (ABSOLUTE): 0.1 10*3/uL (ref 0.0–0.4)
Eos: 1 %
Hematocrit: 42.8 % (ref 37.5–51.0)
Hemoglobin: 14.1 g/dL (ref 13.0–17.7)
IMMATURE GRANS (ABS): 0 10*3/uL (ref 0.0–0.1)
Immature Granulocytes: 0 %
LYMPHS: 45 %
Lymphocytes Absolute: 3.5 10*3/uL — ABNORMAL HIGH (ref 0.7–3.1)
MCH: 29.3 pg (ref 26.6–33.0)
MCHC: 32.9 g/dL (ref 31.5–35.7)
MCV: 89 fL (ref 79–97)
Monocytes Absolute: 0.6 10*3/uL (ref 0.1–0.9)
Monocytes: 7 %
Neutrophils Absolute: 3.5 10*3/uL (ref 1.4–7.0)
Neutrophils: 47 %
PLATELETS: 144 10*3/uL — AB (ref 150–450)
RBC: 4.81 x10E6/uL (ref 4.14–5.80)
RDW: 13.6 % (ref 12.3–15.4)
WBC: 7.6 10*3/uL (ref 3.4–10.8)

## 2017-11-17 LAB — LIPID PANEL
CHOL/HDL RATIO: 5.2 ratio — AB (ref 0.0–5.0)
CHOLESTEROL TOTAL: 197 mg/dL (ref 100–199)
HDL: 38 mg/dL — ABNORMAL LOW (ref >39)
LDL CALC: 101 mg/dL — AB (ref 0–99)
Triglycerides: 292 mg/dL — ABNORMAL HIGH (ref 0–149)
VLDL Cholesterol Cal: 58 mg/dL — ABNORMAL HIGH (ref 5–40)

## 2017-11-17 LAB — PSA

## 2017-11-30 DIAGNOSIS — H16223 Keratoconjunctivitis sicca, not specified as Sjogren's, bilateral: Secondary | ICD-10-CM | POA: Diagnosis not present

## 2017-11-30 DIAGNOSIS — H40052 Ocular hypertension, left eye: Secondary | ICD-10-CM | POA: Diagnosis not present

## 2017-11-30 DIAGNOSIS — H40011 Open angle with borderline findings, low risk, right eye: Secondary | ICD-10-CM | POA: Diagnosis not present

## 2017-11-30 DIAGNOSIS — H401122 Primary open-angle glaucoma, left eye, moderate stage: Secondary | ICD-10-CM | POA: Diagnosis not present

## 2017-12-13 ENCOUNTER — Other Ambulatory Visit (INDEPENDENT_AMBULATORY_CARE_PROVIDER_SITE_OTHER): Payer: Medicare Other

## 2017-12-13 DIAGNOSIS — Z23 Encounter for immunization: Secondary | ICD-10-CM | POA: Diagnosis not present

## 2017-12-24 ENCOUNTER — Other Ambulatory Visit: Payer: Self-pay | Admitting: Medical

## 2017-12-24 DIAGNOSIS — I10 Essential (primary) hypertension: Secondary | ICD-10-CM

## 2017-12-24 DIAGNOSIS — E785 Hyperlipidemia, unspecified: Secondary | ICD-10-CM

## 2018-02-01 ENCOUNTER — Encounter: Payer: Self-pay | Admitting: Family Medicine

## 2018-02-25 DIAGNOSIS — H40011 Open angle with borderline findings, low risk, right eye: Secondary | ICD-10-CM | POA: Diagnosis not present

## 2018-02-25 DIAGNOSIS — H25013 Cortical age-related cataract, bilateral: Secondary | ICD-10-CM | POA: Diagnosis not present

## 2018-02-25 DIAGNOSIS — H25012 Cortical age-related cataract, left eye: Secondary | ICD-10-CM | POA: Diagnosis not present

## 2018-02-25 DIAGNOSIS — H2513 Age-related nuclear cataract, bilateral: Secondary | ICD-10-CM | POA: Diagnosis not present

## 2018-02-25 DIAGNOSIS — H2512 Age-related nuclear cataract, left eye: Secondary | ICD-10-CM | POA: Diagnosis not present

## 2018-02-25 DIAGNOSIS — H401122 Primary open-angle glaucoma, left eye, moderate stage: Secondary | ICD-10-CM | POA: Diagnosis not present

## 2018-03-08 ENCOUNTER — Other Ambulatory Visit: Payer: Self-pay | Admitting: Family Medicine

## 2018-03-08 DIAGNOSIS — I10 Essential (primary) hypertension: Secondary | ICD-10-CM

## 2018-03-12 DIAGNOSIS — L218 Other seborrheic dermatitis: Secondary | ICD-10-CM | POA: Diagnosis not present

## 2018-03-12 DIAGNOSIS — L819 Disorder of pigmentation, unspecified: Secondary | ICD-10-CM | POA: Diagnosis not present

## 2018-03-12 DIAGNOSIS — L570 Actinic keratosis: Secondary | ICD-10-CM | POA: Diagnosis not present

## 2018-04-09 DIAGNOSIS — H2512 Age-related nuclear cataract, left eye: Secondary | ICD-10-CM | POA: Diagnosis not present

## 2018-04-09 DIAGNOSIS — H25812 Combined forms of age-related cataract, left eye: Secondary | ICD-10-CM | POA: Diagnosis not present

## 2018-04-22 ENCOUNTER — Other Ambulatory Visit: Payer: Self-pay | Admitting: Family Medicine

## 2018-04-22 DIAGNOSIS — E785 Hyperlipidemia, unspecified: Secondary | ICD-10-CM

## 2018-05-01 DIAGNOSIS — H2511 Age-related nuclear cataract, right eye: Secondary | ICD-10-CM | POA: Diagnosis not present

## 2018-05-01 DIAGNOSIS — H25041 Posterior subcapsular polar age-related cataract, right eye: Secondary | ICD-10-CM | POA: Diagnosis not present

## 2018-05-01 DIAGNOSIS — H25011 Cortical age-related cataract, right eye: Secondary | ICD-10-CM | POA: Diagnosis not present

## 2018-05-07 DIAGNOSIS — H2511 Age-related nuclear cataract, right eye: Secondary | ICD-10-CM | POA: Diagnosis not present

## 2018-05-07 DIAGNOSIS — H25811 Combined forms of age-related cataract, right eye: Secondary | ICD-10-CM | POA: Diagnosis not present

## 2018-05-28 ENCOUNTER — Ambulatory Visit (INDEPENDENT_AMBULATORY_CARE_PROVIDER_SITE_OTHER): Payer: Medicare Other | Admitting: Family Medicine

## 2018-05-28 ENCOUNTER — Encounter: Payer: Self-pay | Admitting: Family Medicine

## 2018-05-28 ENCOUNTER — Ambulatory Visit
Admission: RE | Admit: 2018-05-28 | Discharge: 2018-05-28 | Disposition: A | Discharge status: Home / Self Care | Payer: Medicare Other | Source: Ambulatory Visit | Attending: Family Medicine | Admitting: Family Medicine

## 2018-05-28 VITALS — BP 138/82 | HR 64 | Temp 98.0°F | Wt 169.2 lb

## 2018-05-28 DIAGNOSIS — R590 Localized enlarged lymph nodes: Secondary | ICD-10-CM | POA: Diagnosis not present

## 2018-05-28 DIAGNOSIS — R221 Localized swelling, mass and lump, neck: Secondary | ICD-10-CM | POA: Diagnosis not present

## 2018-05-28 NOTE — Progress Notes (Addendum)
   Subjective:    Patient ID: Mario Garcia, male    DOB: 16-Jan-1932, 85 y.o.   MRN: [id number]  HPI He complains of a several week history of swelling in the right lateral neck area.  No fever, chills, cough, congestion, shortness of breath, sore throat, earache, weight change.   Review of Systems     Objective:   Physical Exam Alert and in no distress.  Pharyngeal area is normal.  Tongue shows no palpable lesions.  Teeth are normal.  Neck is supple with lateral cervical adenopathy, one is submandibular and the other 2 are lateral, no thyromegaly. Cardiac exam shows a regular sinus rhythm without murmurs or gallops. Lungs are clear to auscultation.        Assessment & Plan:  Lymphadenopathy of right cervical region - Plan: DG Chest 2 View, CBC with Differential/Platelet, Comprehensive metabolic panel I explained that I would do a chest x-ray first but will need further evaluation including CT scan. cxr is negative so I will order a CT soft tissue of the neck  3/11 CT scan is positive for lymphadenopathy.  I relayed this information to him.  We will set him up to see ENT.

## 2018-05-29 ENCOUNTER — Encounter: Payer: Self-pay | Admitting: Internal Medicine

## 2018-05-29 LAB — COMPREHENSIVE METABOLIC PANEL
ALK PHOS: 81 IU/L (ref 39–117)
ALT: 11 IU/L (ref 0–44)
AST: 16 IU/L (ref 0–40)
Albumin/Globulin Ratio: 1.7 (ref 1.2–2.2)
Albumin: 4.5 g/dL (ref 3.6–4.6)
BILIRUBIN TOTAL: 0.5 mg/dL (ref 0.0–1.2)
BUN/Creatinine Ratio: 17 (ref 10–24)
BUN: 18 mg/dL (ref 8–27)
CHLORIDE: 98 mmol/L (ref 96–106)
CO2: 25 mmol/L (ref 20–29)
Calcium: 9.5 mg/dL (ref 8.6–10.2)
Creatinine, Ser: 1.03 mg/dL (ref 0.76–1.27)
GFR calc Af Amer: 77 mL/min/{1.73_m2} (ref >59)
GFR calc non Af Amer: 66 mL/min/{1.73_m2} (ref >59)
GLUCOSE: 87 mg/dL (ref 65–99)
Globulin, Total: 2.6 g/dL (ref 1.5–4.5)
Potassium: 3.9 mmol/L (ref 3.5–5.2)
Sodium: 140 mmol/L (ref 134–144)
Total Protein: 7.1 g/dL (ref 6.0–8.5)

## 2018-05-29 LAB — CBC WITH DIFFERENTIAL/PLATELET
BASOS ABS: 0 10*3/uL (ref 0.0–0.2)
Basos: 0 %
EOS (ABSOLUTE): 0 10*3/uL (ref 0.0–0.4)
Eos: 0 %
Hematocrit: 40.8 % (ref 37.5–51.0)
Hemoglobin: 13.6 g/dL (ref 13.0–17.7)
Immature Grans (Abs): 0 10*3/uL (ref 0.0–0.1)
Immature Granulocytes: 0 %
LYMPHS ABS: 3.9 10*3/uL — AB (ref 0.7–3.1)
Lymphs: 38 %
MCH: 28.8 pg (ref 26.6–33.0)
MCHC: 33.3 g/dL (ref 31.5–35.7)
MCV: 86 fL (ref 79–97)
MONOS ABS: 1.2 10*3/uL — AB (ref 0.1–0.9)
Monocytes: 11 %
Neutrophils Absolute: 5.2 10*3/uL (ref 1.4–7.0)
Neutrophils: 51 %
Platelets: 134 10*3/uL — ABNORMAL LOW (ref 150–450)
RBC: 4.73 x10E6/uL (ref 4.14–5.80)
RDW: 12.4 % (ref 11.6–15.4)
WBC: 10.4 10*3/uL (ref 3.4–10.8)

## 2018-05-29 NOTE — Addendum Note (Signed)
Addended by: Denita Lung on: 05/29/2018 10:15 AM   Modules accepted: Orders

## 2018-06-05 ENCOUNTER — Other Ambulatory Visit: Payer: Self-pay

## 2018-06-05 ENCOUNTER — Ambulatory Visit
Admission: RE | Admit: 2018-06-05 | Discharge: 2018-06-05 | Disposition: A | Discharge status: Home / Self Care | Payer: Medicare Other | Source: Ambulatory Visit | Attending: Family Medicine | Admitting: Family Medicine

## 2018-06-05 DIAGNOSIS — R221 Localized swelling, mass and lump, neck: Secondary | ICD-10-CM | POA: Diagnosis not present

## 2018-06-05 MED ORDER — IOPAMIDOL (ISOVUE-300) INJECTION 61%
75.0000 mL | Freq: Once | INTRAVENOUS | Status: AC | PRN
  Administered 2018-06-05: 75 mL via INTRAVENOUS

## 2018-06-05 NOTE — Addendum Note (Signed)
Addended by: Denita Lung on: 06/05/2018 01:51 PM   Modules accepted: Orders

## 2018-06-06 ENCOUNTER — Other Ambulatory Visit: Payer: Self-pay | Admitting: Family Medicine

## 2018-06-06 DIAGNOSIS — I10 Essential (primary) hypertension: Secondary | ICD-10-CM

## 2018-06-10 ENCOUNTER — Telehealth: Payer: Self-pay

## 2018-06-10 NOTE — Telephone Encounter (Signed)
Patient called about his referral for a biopsy of his neck. He stated he did not get a call about scheduling the appointment. Patient would like a call back once this has been handled. Please advise.

## 2018-06-11 ENCOUNTER — Other Ambulatory Visit: Payer: Self-pay

## 2018-06-11 DIAGNOSIS — R221 Localized swelling, mass and lump, neck: Secondary | ICD-10-CM

## 2018-06-11 NOTE — Telephone Encounter (Signed)
Order was placed for a referral to ENT.

## 2018-07-04 ENCOUNTER — Other Ambulatory Visit: Payer: Self-pay | Admitting: Otolaryngology

## 2018-07-04 DIAGNOSIS — R591 Generalized enlarged lymph nodes: Secondary | ICD-10-CM | POA: Diagnosis not present

## 2018-07-04 DIAGNOSIS — D487 Neoplasm of uncertain behavior of other specified sites: Secondary | ICD-10-CM | POA: Diagnosis not present

## 2018-07-05 ENCOUNTER — Encounter (HOSPITAL_COMMUNITY): Payer: Self-pay | Admitting: *Deleted

## 2018-07-05 ENCOUNTER — Other Ambulatory Visit: Payer: Self-pay

## 2018-07-05 NOTE — Progress Notes (Signed)
  Coronavirus Screening  Have you experienced the following symptoms:  Cough no Fever (>100.92F)  no Runny nose no Sore throat no Difficulty breathing/shortness of breath  no  Have you or a family member traveled in the last 14 days and where? no   If the patient indicates "YES" to the above questions, their PAT will be rescheduled to limit the exposure to others and, the surgeon will be notified. THE PATIENT WILL NEED TO BE ASYMPTOMATIC FOR 14 DAYS.   If the patient is not experiencing any of these symptoms, the PAT nurse will instruct them to NOT bring anyone with them to their appointment since they may have these symptoms or traveled as well.   Please remind your patients and families that hospital visitation restrictions are in effect and the importance of the restrictions.    Spoke with pt and his wife for pre-op call. Pt denies any cardiac history, chest pain or sob. Pt states he is not diabetic.

## 2018-07-07 NOTE — Anesthesia Preprocedure Evaluation (Addendum)
Anesthesia Evaluation  Patient identified by MRN, date of birth, ID band Patient awake    Reviewed: Allergy & Precautions, H&P , NPO status , Patient's Chart, lab work & pertinent test results  Airway Mallampati: II  TM Distance: >3 FB Neck ROM: Full    Dental no notable dental hx. (+) Teeth Intact, Dental Advisory Given   Pulmonary neg pulmonary ROS, former smoker,    Pulmonary exam normal breath sounds clear to auscultation       Cardiovascular Exercise Tolerance: Good hypertension, Pt. on medications  Rhythm:Regular Rate:Normal     Neuro/Psych negative neurological ROS  negative psych ROS   GI/Hepatic negative GI ROS, Neg liver ROS,   Endo/Other  negative endocrine ROS  Renal/GU negative Renal ROS  negative genitourinary   Musculoskeletal   Abdominal   Peds  Hematology negative hematology ROS (+)   Anesthesia Other Findings   Reproductive/Obstetrics negative OB ROS                            Anesthesia Physical Anesthesia Plan  ASA: II  Anesthesia Plan: General   Post-op Pain Management:    Induction: Intravenous  PONV Risk Score and Plan: 3 and Ondansetron, Dexamethasone and Treatment may vary due to age or medical condition  Airway Management Planned: Oral ETT  Additional Equipment:   Intra-op Plan:   Post-operative Plan: Extubation in OR  Informed Consent: I have reviewed the patients History and Physical, chart, labs and discussed the procedure including the risks, benefits and alternatives for the proposed anesthesia with the patient or authorized representative who has indicated his/her understanding and acceptance.     Dental advisory given  Plan Discussed with: CRNA  Anesthesia Plan Comments:         Anesthesia Quick Evaluation

## 2018-07-08 ENCOUNTER — Ambulatory Visit (HOSPITAL_COMMUNITY): Payer: Medicare Other | Admitting: Anesthesiology

## 2018-07-08 ENCOUNTER — Encounter (HOSPITAL_COMMUNITY): Payer: Self-pay

## 2018-07-08 ENCOUNTER — Encounter (HOSPITAL_COMMUNITY)
Admission: RE | Disposition: A | Discharge status: Home / Self Care | Payer: Self-pay | Source: Home / Self Care | Attending: Otolaryngology

## 2018-07-08 ENCOUNTER — Ambulatory Visit (HOSPITAL_COMMUNITY)
Admission: RE | Admit: 2018-07-08 | Discharge: 2018-07-08 | Disposition: A | Discharge status: Home / Self Care | Payer: Medicare Other | Attending: Otolaryngology | Admitting: Otolaryngology

## 2018-07-08 ENCOUNTER — Other Ambulatory Visit: Payer: Self-pay

## 2018-07-08 DIAGNOSIS — Z888 Allergy status to other drugs, medicaments and biological substances status: Secondary | ICD-10-CM | POA: Diagnosis not present

## 2018-07-08 DIAGNOSIS — K219 Gastro-esophageal reflux disease without esophagitis: Secondary | ICD-10-CM | POA: Diagnosis not present

## 2018-07-08 DIAGNOSIS — Z79899 Other long term (current) drug therapy: Secondary | ICD-10-CM | POA: Insufficient documentation

## 2018-07-08 DIAGNOSIS — C8331 Diffuse large B-cell lymphoma, lymph nodes of head, face, and neck: Secondary | ICD-10-CM | POA: Insufficient documentation

## 2018-07-08 DIAGNOSIS — Z87891 Personal history of nicotine dependence: Secondary | ICD-10-CM | POA: Diagnosis not present

## 2018-07-08 DIAGNOSIS — I10 Essential (primary) hypertension: Secondary | ICD-10-CM | POA: Diagnosis not present

## 2018-07-08 DIAGNOSIS — Z7289 Other problems related to lifestyle: Secondary | ICD-10-CM | POA: Diagnosis not present

## 2018-07-08 DIAGNOSIS — C8339 Diffuse large B-cell lymphoma, extranodal and solid organ sites: Secondary | ICD-10-CM | POA: Diagnosis not present

## 2018-07-08 DIAGNOSIS — H409 Unspecified glaucoma: Secondary | ICD-10-CM | POA: Insufficient documentation

## 2018-07-08 DIAGNOSIS — Z880 Allergy status to penicillin: Secondary | ICD-10-CM | POA: Diagnosis not present

## 2018-07-08 DIAGNOSIS — R221 Localized swelling, mass and lump, neck: Secondary | ICD-10-CM | POA: Diagnosis not present

## 2018-07-08 HISTORY — PX: ESOPHAGOSCOPY: SHX5534

## 2018-07-08 HISTORY — PX: DIRECT LARYNGOSCOPY: SHX5326

## 2018-07-08 HISTORY — DX: Personal history of urinary calculi: Z87.442

## 2018-07-08 HISTORY — PX: FLEXIBLE BRONCHOSCOPY: SHX5094

## 2018-07-08 HISTORY — DX: Pneumonia, unspecified organism: J18.9

## 2018-07-08 HISTORY — PX: MASS BIOPSY: SHX5445

## 2018-07-08 LAB — COMPREHENSIVE METABOLIC PANEL
ALT: 19 U/L (ref 0–44)
AST: 24 U/L (ref 15–41)
Albumin: 3.7 g/dL (ref 3.5–5.0)
Alkaline Phosphatase: 73 U/L (ref 38–126)
Anion gap: 14 (ref 5–15)
BUN: 19 mg/dL (ref 8–23)
CO2: 24 mmol/L (ref 22–32)
Calcium: 9 mg/dL (ref 8.9–10.3)
Chloride: 101 mmol/L (ref 98–111)
Creatinine, Ser: 0.89 mg/dL (ref 0.61–1.24)
GFR calc Af Amer: >60 mL/min (ref >60)
GFR calc non Af Amer: >60 mL/min (ref >60)
Glucose, Bld: 98 mg/dL (ref 70–99)
Potassium: 3.6 mmol/L (ref 3.5–5.1)
Sodium: 139 mmol/L (ref 135–145)
Total Bilirubin: 0.4 mg/dL (ref 0.3–1.2)
Total Protein: 6.9 g/dL (ref 6.5–8.1)

## 2018-07-08 LAB — CBC
HCT: 40.9 % (ref 39.0–52.0)
Hemoglobin: 13 g/dL (ref 13.0–17.0)
MCH: 28.4 pg (ref 26.0–34.0)
MCHC: 31.8 g/dL (ref 30.0–36.0)
MCV: 89.3 fL (ref 80.0–100.0)
Platelets: 129 10*3/uL — ABNORMAL LOW (ref 150–400)
RBC: 4.58 MIL/uL (ref 4.22–5.81)
RDW: 12.7 % (ref 11.5–15.5)
WBC: 7 10*3/uL (ref 4.0–10.5)
nRBC: 0 % (ref 0.0–0.2)

## 2018-07-08 SURGERY — BIOPSY, MASS, NECK
Anesthesia: General | Site: Neck | Laterality: Right

## 2018-07-08 MED ORDER — LACTATED RINGERS IV SOLN
INTRAVENOUS | Status: DC
  Administered 2018-07-08: 07:00:00 via INTRAVENOUS

## 2018-07-08 MED ORDER — PHENYLEPHRINE 40 MCG/ML (10ML) SYRINGE FOR IV PUSH (FOR BLOOD PRESSURE SUPPORT)
PREFILLED_SYRINGE | INTRAVENOUS | Status: AC
  Filled 2018-07-08: qty 10

## 2018-07-08 MED ORDER — OXYMETAZOLINE HCL 0.05 % NA SOLN
NASAL | Status: AC
  Filled 2018-07-08: qty 30

## 2018-07-08 MED ORDER — ROCURONIUM BROMIDE 10 MG/ML (PF) SYRINGE
PREFILLED_SYRINGE | INTRAVENOUS | Status: DC | PRN
  Administered 2018-07-08: 50 mg via INTRAVENOUS

## 2018-07-08 MED ORDER — ONDANSETRON HCL 4 MG/2ML IJ SOLN
INTRAMUSCULAR | Status: AC
  Filled 2018-07-08: qty 2

## 2018-07-08 MED ORDER — FENTANYL CITRATE (PF) 100 MCG/2ML IJ SOLN
INTRAMUSCULAR | Status: DC | PRN
  Administered 2018-07-08: 100 ug via INTRAVENOUS

## 2018-07-08 MED ORDER — OXYCODONE-ACETAMINOPHEN 5-325 MG PO TABS: 1.0000 | ORAL_TABLET | ORAL | 0 refills | Status: DC | PRN

## 2018-07-08 MED ORDER — SUGAMMADEX SODIUM 200 MG/2ML IV SOLN
INTRAVENOUS | Status: DC | PRN
  Administered 2018-07-08 (×2): 200 mg via INTRAVENOUS

## 2018-07-08 MED ORDER — DEXAMETHASONE SODIUM PHOSPHATE 10 MG/ML IJ SOLN
INTRAMUSCULAR | Status: DC | PRN
  Administered 2018-07-08: 10 mg via INTRAVENOUS

## 2018-07-08 MED ORDER — SUCCINYLCHOLINE CHLORIDE 20 MG/ML IJ SOLN
INTRAMUSCULAR | Status: DC | PRN
  Administered 2018-07-08: 80 mg via INTRAVENOUS

## 2018-07-08 MED ORDER — ROCURONIUM BROMIDE 50 MG/5ML IV SOSY
PREFILLED_SYRINGE | INTRAVENOUS | Status: AC
  Filled 2018-07-08: qty 5

## 2018-07-08 MED ORDER — HEMOSTATIC AGENTS (NO CHARGE) OPTIME
TOPICAL | Status: DC | PRN
  Administered 2018-07-08: 1 via TOPICAL

## 2018-07-08 MED ORDER — ONDANSETRON HCL 4 MG/2ML IJ SOLN
INTRAMUSCULAR | Status: DC | PRN
  Administered 2018-07-08: 4 mg via INTRAVENOUS

## 2018-07-08 MED ORDER — CLINDAMYCIN PHOSPHATE 900 MG/50ML IV SOLN
900.0000 mg | INTRAVENOUS | Status: AC
  Administered 2018-07-08: 900 mg via INTRAVENOUS
  Filled 2018-07-08: qty 50

## 2018-07-08 MED ORDER — DEXAMETHASONE SODIUM PHOSPHATE 10 MG/ML IJ SOLN
INTRAMUSCULAR | Status: AC
  Filled 2018-07-08: qty 1

## 2018-07-08 MED ORDER — ESMOLOL HCL 100 MG/10ML IV SOLN
INTRAVENOUS | Status: AC
  Filled 2018-07-08: qty 10

## 2018-07-08 MED ORDER — LIDOCAINE-EPINEPHRINE 1 %-1:100000 IJ SOLN
INTRAMUSCULAR | Status: AC
  Filled 2018-07-08: qty 1

## 2018-07-08 MED ORDER — EPHEDRINE SULFATE-NACL 50-0.9 MG/10ML-% IV SOSY
PREFILLED_SYRINGE | INTRAVENOUS | Status: DC | PRN
  Administered 2018-07-08: 5 mg via INTRAVENOUS

## 2018-07-08 MED ORDER — PROPOFOL 10 MG/ML IV BOLUS
INTRAVENOUS | Status: AC
  Filled 2018-07-08: qty 20

## 2018-07-08 MED ORDER — ACETAMINOPHEN 500 MG PO TABS
1000.0000 mg | ORAL_TABLET | Freq: Once | ORAL | Status: AC
  Administered 2018-07-08: 1000 mg via ORAL

## 2018-07-08 MED ORDER — PHENYLEPHRINE 40 MCG/ML (10ML) SYRINGE FOR IV PUSH (FOR BLOOD PRESSURE SUPPORT)
PREFILLED_SYRINGE | INTRAVENOUS | Status: DC | PRN
  Administered 2018-07-08 (×3): 80 ug via INTRAVENOUS

## 2018-07-08 MED ORDER — LIDOCAINE 2% (20 MG/ML) 5 ML SYRINGE
INTRAMUSCULAR | Status: AC
  Filled 2018-07-08: qty 5

## 2018-07-08 MED ORDER — LIDOCAINE-EPINEPHRINE 1 %-1:100000 IJ SOLN
INTRAMUSCULAR | Status: DC | PRN
  Administered 2018-07-08: 2 mL

## 2018-07-08 MED ORDER — 0.9 % SODIUM CHLORIDE (POUR BTL) OPTIME
TOPICAL | Status: DC | PRN
  Administered 2018-07-08: 08:00:00 1000 mL

## 2018-07-08 MED ORDER — PROPOFOL 10 MG/ML IV BOLUS
INTRAVENOUS | Status: DC | PRN
  Administered 2018-07-08: 120 mg via INTRAVENOUS

## 2018-07-08 MED ORDER — EPINEPHRINE HCL (NASAL) 0.1 % NA SOLN
NASAL | Status: AC
  Filled 2018-07-08: qty 30

## 2018-07-08 MED ORDER — ACETAMINOPHEN 500 MG PO TABS
ORAL_TABLET | ORAL | Status: AC
  Administered 2018-07-08: 06:00:00 1000 mg via ORAL
  Filled 2018-07-08: qty 2

## 2018-07-08 MED ORDER — FENTANYL CITRATE (PF) 250 MCG/5ML IJ SOLN
INTRAMUSCULAR | Status: AC
  Filled 2018-07-08: qty 5

## 2018-07-08 MED ORDER — LIDOCAINE 2% (20 MG/ML) 5 ML SYRINGE
INTRAMUSCULAR | Status: DC | PRN
  Administered 2018-07-08: 40 mg via INTRAVENOUS
  Administered 2018-07-08: 60 mg via INTRAVENOUS

## 2018-07-08 SURGICAL SUPPLY — 43 items
ADH SKN CLS APL DERMABOND .7 (GAUZE/BANDAGES/DRESSINGS) ×2
BLADE 10 SAFETY STRL DISP (BLADE) ×3 IMPLANT
BLADE SURG 15 STRL LF DISP TIS (BLADE) IMPLANT
BLADE SURG 15 STRL SS (BLADE)
CANISTER SUCT 3000ML PPV (MISCELLANEOUS) ×4 IMPLANT
CLEANER TIP ELECTROSURG 2X2 (MISCELLANEOUS) ×4 IMPLANT
CONT SPEC 4OZ CLIKSEAL STRL BL (MISCELLANEOUS) IMPLANT
COVER BACK TABLE 60X90IN (DRAPES) ×4 IMPLANT
COVER SURGICAL LIGHT HANDLE (MISCELLANEOUS) ×4 IMPLANT
COVER WAND RF STERILE (DRAPES) ×4 IMPLANT
DERMABOND ADVANCED (GAUZE/BANDAGES/DRESSINGS) ×2
DERMABOND ADVANCED .7 DNX12 (GAUZE/BANDAGES/DRESSINGS) ×2 IMPLANT
DRAPE HALF SHEET 40X57 (DRAPES) ×4 IMPLANT
ELECT COATED BLADE 2.86 ST (ELECTRODE) IMPLANT
ELECT NEEDLE BLADE 2-5/6 (NEEDLE) IMPLANT
ELECT REM PT RETURN 9FT ADLT (ELECTROSURGICAL) ×4
ELECTRODE REM PT RTRN 9FT ADLT (ELECTROSURGICAL) ×2 IMPLANT
GAUZE SPONGE 4X4 12PLY STRL (GAUZE/BANDAGES/DRESSINGS) ×4 IMPLANT
GAUZE SPONGE 4X4 12PLY STRL LF (GAUZE/BANDAGES/DRESSINGS) ×4 IMPLANT
GLOVE BIO SURGEON STRL SZ7.5 (GLOVE) ×4 IMPLANT
GLOVE ECLIPSE 7.5 STRL STRAW (GLOVE) ×4 IMPLANT
GOWN STRL REUS W/ TWL LRG LVL3 (GOWN DISPOSABLE) ×4 IMPLANT
GOWN STRL REUS W/TWL LRG LVL3 (GOWN DISPOSABLE) ×8
GUARD TEETH (MISCELLANEOUS) ×4 IMPLANT
HEMOSTAT SNOW SURGICEL 2X4 (HEMOSTASIS) ×4 IMPLANT
KIT BASIN OR (CUSTOM PROCEDURE TRAY) ×4 IMPLANT
KIT TURNOVER KIT B (KITS) ×4 IMPLANT
NEEDLE HYPO 25GX1X1/2 BEV (NEEDLE) IMPLANT
NS IRRIG 1000ML POUR BTL (IV SOLUTION) ×4 IMPLANT
PAD ARMBOARD 7.5X6 YLW CONV (MISCELLANEOUS) ×8 IMPLANT
PATTIES SURGICAL .5 X1 (DISPOSABLE) IMPLANT
PENCIL BUTTON HOLSTER BLD 10FT (ELECTRODE) ×4 IMPLANT
SOLUTION ANTI FOG 6CC (MISCELLANEOUS) IMPLANT
SPECIMEN JAR SMALL (MISCELLANEOUS) ×4 IMPLANT
SURGILUBE 2OZ TUBE FLIPTOP (MISCELLANEOUS) ×4 IMPLANT
SUT SILK 2 0 (SUTURE)
SUT SILK 2-0 18XBRD TIE 12 (SUTURE) IMPLANT
SUT VIC AB 4-0 PS2 18 (SUTURE) ×4 IMPLANT
TOWEL OR 17X24 6PK STRL BLUE (TOWEL DISPOSABLE) ×8 IMPLANT
TRAY ENT MC OR (CUSTOM PROCEDURE TRAY) ×4 IMPLANT
TUBE CONNECTING 12'X1/4 (SUCTIONS) ×1
TUBE CONNECTING 12X1/4 (SUCTIONS) ×3 IMPLANT
WATER STERILE IRR 1000ML POUR (IV SOLUTION) ×4 IMPLANT

## 2018-07-08 NOTE — Op Note (Signed)
DATE OF PROCEDURE:  07/08/2018                              OPERATIVE REPORT  SURGEON:  Leta Baptist, MD  PREOPERATIVE DIAGNOSES: 1. Right neck masses  POSTOPERATIVE DIAGNOSES: 1. Right neck masses  PROCEDURE PERFORMED:   1. Open biopsy of enlarged right deep neck lymph node 2. Direct laryngoscopy 3. Flexible bronchoscopy 4. Rigid trans-oral esophagoscopy  ANESTHESIA:  General endotracheal tube anesthesia.  COMPLICATIONS:  None.  ESTIMATED BLOOD LOSS:  Minimal.  INDICATION FOR PROCEDURE:  Mario Garcia is a 85 y.o. male with a history of multiple masses along the right lateral neck.  His neck CT showed pathologic right sided Level II, III, IV and V lymphadenopathy.  The appearance was suggestive of metastatic carcinoma.  However, no obvious primary source was noted on the neck CT scan.  The patient was not symptomatic.  He denied any dysphagia, odynophagia, or dyspnea.  He quit the use of tobacco more than 60 years ago.  Based on the above findings, the decision was made for the patient to undergo the above stated procedures.  The risks, benefits, alternatives, and details of the procedure were discussed with the patient.  Questions were invited and answered.  Informed consent was obtained.  DESCRIPTION:  The patient was taken to the operating room and placed supine on the operating table.  General endotracheal tube anesthesia was administered by the anesthesiologist.  The patient was positioned and prepped and draped in a standard fashion for endoscopy examination.   A Dedo laryngoscope was inserted via the oral cavity into the pharynx.  Examination of the epiglottis, vallecula, aryepiglottic folds, piriform sinuses, and the laryngeal inlet were all normal.  No suspicious mass or lesion was noted.  The rest of the pharyngeal walls were also normal.  The Dedo laryngoscope was withdrawn.  Rigid esophagoscope was then inserted via the oral cavity into the esophageal inlet.  A rigid  esophagoscope was advanced along the esophageal lumen.  Examination of the esophageal mucosa showed no evidence of mass or lesion.  The rigid esophagoscope was withdrawn.  Flexible bronchoscope was inserted through the endotracheal tube.  It was advanced into the trachea.  Examination of the trachea mucosa, carina, bilateral mainstem bronchi, and segmental takeoffs were all normal.  No mucosal lesion was noted.  The flexible scope was withdrawn.  The patient was repositioned and prepped and draped in the standard fashion for open biopsy of his right cervical lymphadenopathy.  The patient was noted to have multiple enlarged lymph nodes along his right level 2, 3, 4, and 5 cervical regions.  The largest mass was noted at the left level 4 area.  1% lidocaine with 1-100,000 epinephrine was infiltrated at the planned site of incision.  A transverse lower neck incision was made over the right level 4 lymph node.  The incision was carried down to the level of the platysma muscles.  Superiorly and inferiorly based subplatysmal flaps were elevated in the standard fashion.  The sternocleidomastoid muscle was identified.  A largest 3 cm mass was noted deep to the SCM muscle.  The SCM muscle was divided longitudinally, in order to expose the underlying enlarged lymph node.  Two large incisional biopsy specimens were obtained from the lymph node.  The biopsy specimens were sent to the pathology department as a fresh specimen per lymphoma protocol.  The surgical site was copiously irrigated.  Hemostasis was  achieved with the Bovie electrocautery and Surgi-snow packing.  The incision was closed in layers with 4-0 Vicryl and Dermabond.  The care of the patient was turned over to the anesthesiologist.  The patient was awakened from anesthesia without difficulty.  The patient was extubated and transferred to the recovery room in good condition.  OPERATIVE FINDINGS: Multiple enlarged lymph nodes noted along the right lateral  neck. Two large biopsy specimens obtained from a level IV LN.  SPECIMEN:  Right neck lymph node biopsy specimens.  FOLLOWUP CARE:  The patient will be discharged home once awake and alert. The patient will follow up in my office in approximately 1 week.  Yossef Gilkison W Aarnav Steagall 07/08/2018 8:41 AM

## 2018-07-08 NOTE — Anesthesia Procedure Notes (Signed)
Procedure Name: Intubation Performed by: Milford Cage, CRNA Pre-anesthesia Checklist: Patient identified, Emergency Drugs available, Suction available and Patient being monitored Patient Re-evaluated:Patient Re-evaluated prior to induction Oxygen Delivery Method: Circle System Utilized Preoxygenation: Pre-oxygenation with 100% oxygen Induction Type: IV induction and Rapid sequence Laryngoscope Size: Mac and 4 Grade View: Grade II Tube type: Oral Tube size: 8.0 mm Number of attempts: 1 Airway Equipment and Method: Stylet and Oral airway Placement Confirmation: ETT inserted through vocal cords under direct vision,  positive ETCO2 and breath sounds checked- equal and bilateral Secured at: 22 cm Tube secured with: Tape Dental Injury: Teeth and Oropharynx as per pre-operative assessment

## 2018-07-08 NOTE — Anesthesia Postprocedure Evaluation (Signed)
Anesthesia Post Note  Patient: Mario Garcia  Procedure(s) Performed: RIGHT NECK MASS EXCISIONAL BIOPSY (Right Neck) DIRECT LARYNGOSCOPY (N/A Mouth) FLEXIBLE BRONCHOSCOPY (N/A Mouth) ESOPHAGOSCOPY (N/A Mouth)     Patient location during evaluation: PACU Anesthesia Type: General Level of consciousness: awake and alert Pain management: pain level controlled Vital Signs Assessment: post-procedure vital signs reviewed and stable Respiratory status: spontaneous breathing, nonlabored ventilation and respiratory function stable Cardiovascular status: blood pressure returned to baseline and stable Postop Assessment: no apparent nausea or vomiting Anesthetic complications: no    Last Vitals:  Vitals:   07/08/18 0900 07/08/18 0913  BP: 130/65 134/69  Pulse: 62 (!) 58  Resp: 14 16  Temp:  36.5 C  SpO2: 97% 96%    Last Pain:  Vitals:   07/08/18 0913  TempSrc:   PainSc: 0-No pain                 Adyen Bifulco,W. EDMOND

## 2018-07-08 NOTE — Transfer of Care (Signed)
Immediate Anesthesia Transfer of Care Note  Patient: Mario Garcia  Procedure(s) Performed: RIGHT NECK MASS EXCISIONAL BIOPSY (Right Neck) DIRECT LARYNGOSCOPY (N/A Mouth) FLEXIBLE BRONCHOSCOPY (N/A Mouth) ESOPHAGOSCOPY (N/A Mouth)  Patient Location: PACU  Anesthesia Type:General  Level of Consciousness: drowsy and patient cooperative  Airway & Oxygen Therapy: Patient Spontanous Breathing  Post-op Assessment: Report given to RN and Post -op Vital signs reviewed and stable  Post vital signs: Reviewed and stable  Last Vitals:  Vitals Value Taken Time  BP 121/62 07/08/2018  8:42 AM  Temp    Pulse 58 07/08/2018  8:42 AM  Resp 11 07/08/2018  8:42 AM  SpO2 93 % 07/08/2018  8:42 AM  Vitals shown include unvalidated device data.  Last Pain:  Vitals:   07/08/18 0555  TempSrc:   PainSc: 0-No pain      Patients Stated Pain Goal: 2 (69/45/03 8882)  Complications: No apparent anesthesia complications

## 2018-07-08 NOTE — H&P (Signed)
Cc: Right neck masses  HPI: The patient is an 85 year old male who presents today for evaluation of his right neck masses.  The patient is seen in consultation requested by Dr. Jill Alexanders.   According to the patient, he first noted a right lower neck mass approximately 8 weeks ago.  Since then, he has noted a few other neck masses up and down the right lateral neck.  He was evaluated by Dr. Redmond School, and a neck CT scan was ordered. The CT showed pathologic right sided Level II, III, IV and V lymphadenopathy.  The appearance was suggestive of metastatic carcinoma.  However, no obvious primary source was noted on the neck CT scan.  The patient is currently not symptomatic.  He denies any dysphagia, odynophagia, or dyspnea.  He also denies any significant sore throat.  He quit the use of tobacco more than 60 years ago.  He has no previous ENT surgery.  He also denies any fever or night sweat.    The patient's review of systems (constitutional, eyes, ENT, cardiovascular, respiratory, GI, musculoskeletal, skin, neurologic, psychiatric, endocrine, hematologic, allergic) is noted in the ROS questionnaire.  It is reviewed with the patient.  Family health history: Hearing Loss. No HTN, DM, CAD or bleeding disorders. Major events: Prostatectomy.  Ongoing medical problems: Catartacts, galucoma, Reflux.  Social history: The patient is married and retired from work. He denies use of tobacco products or illegal drugs. He consume 2 glasses of wine weekly.  Exam: General: Communicates without difficulty, well nourished, no acute distress. Head: Normocephalic, no evidence injury, no tenderness, facial buttresses intact without stepoff. Face/sinus: No tenderness to palpation and percussion. Facial movement is normal and symmetric. Eyes: PERRL, EOMI. No scleral icterus, conjunctivae clear. Neuro: CN II exam reveals vision grossly intact.  No nystagmus at any point of gaze. Ears: Auricles well formed without lesions.  Ear  canals are intact without mass or lesion.  No erythema or edema is appreciated.  The TMs are intact without fluid. Nose: External evaluation reveals normal support and skin without lesions.  Dorsum is intact.  Anterior rhinoscopy reveals pink mucosa over anterior aspect of inferior turbinates and intact septum.  No purulence noted. Oral:  Oral cavity and oropharynx are intact, symmetric, without erythema or edema.  Mucosa is moist without lesions. Neck: Full range of motion without pain.  There is no significant lymphadenopathy.  Multiple firm 1-2cm lymph nodes are noted within the right level 2,3,4, and 5 neck. Thyroid bed within normal limits to palpation.  Parotid glands and submandibular glands equal bilaterally without mass.  Trachea is midline.   Procedure:  Flexible Fiberoptic Laryngoscopy Risks, benefits, and alternatives of flexible endoscopy were explained to the patient.  Specific mention was made of the risk of throat numbness with difficulty swallowing, possible bleeding from the nose and mouth, and pain from the procedure.  The patient gave oral consent to proceed.  The nasal cavities were decongested and anesthetised with a combination of oxymetazoline and 4% lidocaine solution.  The flexible scope was inserted into the right nasal cavity and advanced towards the nasopharynx.  Visualized mucosa over the turbinates and septum were as described above.  The nasopharynx was clear.  Oropharyngeal walls were symmetric and mobile without lesion, mass, or edema.  Hypopharynx was also without  lesion or edema.  Larynx was mobile without lesions. Supraglottic structures were free of edema, mass, and asymmetry.  True vocal folds were white without mass or lesion.  Base of tongue was within  normal limits.   Assessment 1.  Multiple right cervical lymphadenopathy.  Some nodes show central necrosis, with the largest lymph node measuring 18 mm in diameter.  The appearance is concerning for malignancy.  2.  The  patient has a normal laryngoscopy examination today.  No obvious mucosal lesion is noted today.   Plan  1.  The physical exam and laryngoscopy findings are reviewed with the patient.  The CT findings are also reviewed.  2.  Based on the above findings, the patient will benefit from undergoing panendoscopy and biopsy of one of his right cervical lymph nodes. Any suspicious lesion noted during the panendoscopy will also be biopsied.  3.  The risks, benefits, alternatives and details of the procedures are reviewed with the patient.   Questions are invited and answered.  4.  The patient would like to proceed with the procedures.

## 2018-07-08 NOTE — Discharge Instructions (Signed)
The patient may resume all his previous activities and diet.  He will follow-up in my office in 1 week.

## 2018-07-09 ENCOUNTER — Encounter (HOSPITAL_COMMUNITY): Payer: Self-pay | Admitting: Otolaryngology

## 2018-07-09 DIAGNOSIS — C8339 Diffuse large B-cell lymphoma, extranodal and solid organ sites: Secondary | ICD-10-CM | POA: Diagnosis not present

## 2018-07-15 ENCOUNTER — Telehealth: Payer: Self-pay | Admitting: Hematology

## 2018-07-15 NOTE — Telephone Encounter (Signed)
A new patient appt has been scheduled for Mario Garcia to see Dr. Irene Limbo on 4/21 at 11am. Mario Garcia has been made aware of the no visitor policy. I did advise he could have his wife on his speaker phone while in the exam room if he chooses. Pt was appreciative of this info. Aware to arrive 15 minutes early.

## 2018-07-15 NOTE — Progress Notes (Signed)
HEMATOLOGY/ONCOLOGY CONSULTATION NOTE  Date of Service: 07/16/2018  Patient Care Team: Denita Lung, MD as PCP - General (Family Medicine)  CHIEF COMPLAINTS/PURPOSE OF CONSULTATION:  Diffuse Large B-Cell Lymphoma  HISTORY OF PRESENTING ILLNESS:   Mario Garcia is a wonderful 85 y.o. male who has been referred to [place] by Dr. Jill Alexanders for evaluation and management of Diffuse Large B-Cell Lymphoma. The pt reports that he is doing well overall.   The pt reports that he feels that he has been a little more tired recently. He first noticed some "nodules" on his neck appear "4-6 weeks ago." He notes that these nodules haven't been painful. He appears to have presented to care with his PCP on 05/28/18, Dr. Redmond School. He notes that he has noticed that he has been sweating more in the last year. He denies noticing any other new lumps or bumps. He denies any fevers, chills, drenching night sweats, or unexpected weight loss. He notes that he has had some changes in swallowing over the last 6 months, which he characterizes as "getting strangled on his saliva every now and then." He denies abdominal pains, acid reflux, changes in bowel habits, leg swelling, skin rashes. He endorses a deep pain "every once in a while," in his left groin. He denies headaches. He endorses glaucoma and a history of cataracts. He sees Dr. Hetty Blend in ophthalmology. He denies any other concern in the last 6 months.  The pt notes that he lives independently with his wife and still is able to do all he wants to do. He walks on trails with his wife and notes that he can generally walk as far as he likes to.  The pt notes that he had prostate cancer in 2008 or 2009, s/p prostatectomy. He did not require RT nor systemic treatments. He denies lung, heart or kidney problems.  Of note prior to the patient's visit today, pt has had a CT Neck completed on 06/05/18 with results revealing Pathologic RIGHT-sided level II, III, IV,  and V lymphadenopathy, most consistent with metastatic carcinoma. An obvious primary source is not identified. Tissue sampling is warranted.  Most recent lab results (07/08/18) of CBC and CMP is as follows: all values are WNL except for PLT at 129k.  On review of systems, pt reports slightly more tired, neck nodules, some changes in swallowing, staying active, and denies fevers, chills, drenching night sweats, unexpected weight loss, abdominal pains, changes in bowel habits, skin rashes, leg swelling, CP, SOB, difficulty breathing, headaches, other lumps or bumps, skin lesions, mouth sores, pain along the spine, back pain, leg swelling, and any other symptoms.   On PMHx the pt reports localized Prostate cancer in 2008 s/p prostatectomy.  On Social Hx the pt reports that he quit smoking over 50 years ago. He notes that he consumes about 1 glass of wine every day, without concerns for excessive drinking. 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 He denies concern for chemical or radiation exposure. On Family Hx the pt reports wife with Stage IV Non Hodgkin's Lymphoma, paternal grandmother with leukemia in her 64s, father with unspecified abdominal cancer.   MEDICAL HISTORY:  Past Medical History:  Diagnosis Date  . Cancer ([place])    PROSTATE  . History of colonic polyps   . History of kidney stones   . Hypertension   . Inguinal hernia 03/2002  . Pneumonia    "years ago"    SURGICAL  HISTORY: Past Surgical History:  Procedure Laterality Date  . COLONOSCOPY    . DIRECT LARYNGOSCOPY N/A 07/08/2018   Procedure: DIRECT LARYNGOSCOPY;  Surgeon: Leta Baptist, MD;  Location: [place];  Service: ENT;  Laterality: N/A;  . ESOPHAGOSCOPY N/A 07/08/2018   Procedure: ESOPHAGOSCOPY;  Surgeon: Leta Baptist, MD;  Location: [place];  Service: ENT;  Laterality: N/A;  . FLEXIBLE BRONCHOSCOPY N/A 07/08/2018   Procedure: FLEXIBLE BRONCHOSCOPY;  Surgeon: Leta Baptist, MD;  Location: [place];  Service:  ENT;  Laterality: N/A;  . HERNIA REPAIR Left    inguinal hernia  . MASS BIOPSY Right 07/08/2018   Procedure: RIGHT NECK MASS EXCISIONAL BIOPSY;  Surgeon: Leta Baptist, MD;  Location: [place];  Service: ENT;  Laterality: Right;  . PATELLA FRACTURE SURGERY Left   . PROSTATE SURGERY  10/2005   RADIAL PROSTATECTOMY  . ROTATOR CUFF REPAIR Bilateral     SOCIAL HISTORY: Social History   Socioeconomic History  . Marital status: Married    Spouse name: Not on file  . Number of children: Not on file  . Years of education: Not on file  . Highest education level: Not on file  Occupational History  . Not on file  Social Needs  . Financial resource strain: Not on file  . Food insecurity:    Worry: Not on file    Inability: Not on file  . Transportation needs:    Medical: Not on file    Non-medical: Not on file  Tobacco Use  . Smoking status: Former Research scientist (life sciences)  . Smokeless tobacco: Never Used  . Tobacco comment: stopped 60 years ago  Substance and Sexual Activity  . Alcohol use: Yes    Alcohol/week: 6.0 standard drinks    Types: 6 Standard drinks or equivalent per week  . Drug use: No  . Sexual activity: Not Currently  Lifestyle  . Physical activity:    Days per week: Not on file    Minutes per session: Not on file  . Stress: Not on file  Relationships  . Social connections:    Talks on phone: Not on file    Gets together: Not on file    Attends religious service: Not on file    Active member of club or organization: Not on file    Attends meetings of clubs or organizations: Not on file    Relationship status: Not on file  . Intimate partner violence:    Fear of current or ex partner: Not on file    Emotionally abused: Not on file    Physically abused: Not on file    Forced sexual activity: Not on file  Other Topics Concern  . Not on file  Social History Narrative  . Not on file    FAMILY HISTORY: Family History  Problem Relation Age of Onset  . Cancer Father   . Cancer  Brother     ALLERGIES:  is allergic to lisinopril and penicillins.  MEDICATIONS:  Current Outpatient Medications  Medication Sig Dispense Refill  . Carboxymethylcellulose Sodium (ARTIFICIAL TEARS OP) Place 1 drop into both eyes daily as needed (dry eyes).    . dorzolamide (TRUSOPT) 2 % ophthalmic solution Place 1 drop into both eyes 2 (two) times daily.    Marland Kitchen latanoprost (XALATAN) 0.005 % ophthalmic solution Place 1 drop into both eyes at bedtime.    Marland Kitchen losartan-hydrochlorothiazide (HYZAAR) 50-12.5 MG tablet TAKE 1 TABLET BY MOUTH EVERY DAY (Patient taking differently: Take 1 tablet by mouth daily with  lunch. ) 90 tablet 0  . oxyCODONE-acetaminophen (PERCOCET) 5-325 MG tablet Take 1 tablet by mouth every 4 (four) hours as needed for severe pain. 20 tablet 0  . pravastatin (PRAVACHOL) 40 MG tablet TAKE 1 TABLET BY MOUTH ONCE DAILY (Patient taking differently: Take 40 mg by mouth daily with lunch. ) 90 tablet 2   No current facility-administered medications for this visit.     REVIEW OF SYSTEMS:    10 Point review of Systems was done is negative except as noted above.  PHYSICAL EXAMINATION: ECOG PERFORMANCE STATUS: 1 - Symptomatic but completely ambulatory  . Vitals:   07/16/18 1102  BP: 140/70  Pulse: 70  Resp: 17  Temp: 98.4 F (36.9 C)  SpO2: 97%   Filed Weights   07/16/18 1102  Weight: 166 lb 4.8 oz (75.4 kg)   .Body mass index is 23.86 kg/m.  GENERAL:alert, in no acute distress and comfortable SKIN: no acute rashes, no significant lesions EYES: conjunctiva are pink and non-injected, sclera anicteric OROPHARYNX: MMM, no exudates, no oropharyngeal erythema or ulceration NECK: supple, no JVD LYMPH: Multiple palpable right cervical lymph nodes ranging in size from 2-3 cm up to 4cm. Palpable right supraclavicular Level IV and Level V lymph nodes. No palpable lymphadenopathy in the axillary or inguinal regions LUNGS: clear to auscultation b/l with normal respiratory  effort HEART: regular rate & rhythm ABDOMEN:  normoactive bowel sounds , non tender, not distended. No palpable hepatosplenomegaly. Extremity: no pedal edema PSYCH: alert & oriented x 3 with fluent speech NEURO: no focal motor/sensory deficits  LABORATORY DATA:  I have reviewed the data as listed  . CBC Latest Ref Rng & Units 07/08/2018 05/28/2018 11/16/2017  WBC 4.0 - 10.5 K/uL 7.0 10.4 7.6  Hemoglobin 13.0 - 17.0 g/dL 13.0 13.6 14.1  Hematocrit 39.0 - 52.0 % 40.9 40.8 42.8  Platelets 150 - 400 K/uL 129(L) 134(L) 144(L)    . CMP Latest Ref Rng & Units 07/08/2018 05/28/2018 11/16/2017  Glucose 70 - 99 mg/dL 98 87 87  BUN 8 - 23 mg/dL _0 Creatinine 0.61 - 1.24 mg/dL 0.89 1.03 0.83  Sodium 135 - 145 mmol/L 139 140 140  Potassium 3.5 - 5.1 mmol/L 3.6 3.9 4.5  Chloride 98 - 111 mmol/L 101 98 99  CO2 22 - 32 mmol/L _1 Calcium 8.9 - 10.3 mg/dL 9.0 9.5 9.3  Total Protein 6.5 - 8.1 g/dL 6.9 7.1 7.2  Total Bilirubin 0.3 - 1.2 mg/dL 0.4 0.5 0.4  Alkaline Phos 38 - 126 U/L 73 81 65  AST 15 - 41 U/L _2 ALT 0 - 44 U/L _3 . Lab Results  Component Value Date   LDH 217 (H) 07/16/2018    07/08/18 Right Cervical Tissue Biopsy:    RADIOGRAPHIC STUDIES: I have personally reviewed the radiological images as listed and agreed with the findings in the report. No results found.  ASSESSMENT & PLAN:   85 y.o. male with  1. Diffuse Large B-Cell Lymphoma Presenting without constitutional symptoms. Palpable right cervical and supraclavicular lymphadenopathy.  PLAN: -Discussed patient's most recent labs from 07/08/18, WBC normal at 7.0k, HGB normal at 13.0, PLT slightly lower at 129k. Chemistries are normal. -Discussed the 07/08/18 Right cervical soft tissue biopsy which revealed Diffuse Large B-Cell Lymphoma, germinal center type -Discussed the 06/05/18 CT Neck which revealed Pathologic RIGHT-sided level II, III, IV, and V lymphadenopathy, most consistent with  metastatic carcinoma. An obvious primary  source is not identified. Tissue sampling is warranted. -Discussed the diagnosis, natural history, indications for treatment, and intent of treatment -Provided supplemental information about DLBCL -Will review and discuss NCCN guidelines and treatment planning with the pt after obtaining PET/CT -Will order PET/CT for staging and treatment planning -Will order ECHO for baseline and treatment planning -Will order blood tests today -Will see the pt back in 10 days   Labs today ECHO In 3-4 days PET/CT in 5 days RTC with Dr Irene Limbo in 10 days with above results   All of the patients questions were answered with apparent satisfaction. The patient knows to call the clinic with any problems, questions or concerns.  The total time spent in the appt was 60 minutes and more than 50% was on counseling and direct patient cares.    Sullivan Lone MD MS AAHIVMS [facility] Hematology/Oncology Physician [facility]  (Office):       [phone] (Work cell):  [phone] (Fax):           [phone]  07/16/2018 12:21 PM  I, Baldwin Jamaica, am acting as a scribe for Dr. Sullivan Lone.   .I have reviewed the above documentation for accuracy and completeness, and I agree with the above. Brunetta Genera MD

## 2018-07-16 ENCOUNTER — Inpatient Hospital Stay: Payer: Medicare Other | Attending: Hematology | Admitting: Hematology

## 2018-07-16 ENCOUNTER — Inpatient Hospital Stay: Payer: Medicare Other

## 2018-07-16 ENCOUNTER — Other Ambulatory Visit: Payer: Self-pay

## 2018-07-16 VITALS — BP 140/70 | HR 70 | Temp 98.4°F | Resp 17 | Ht 70.0 in | Wt 166.3 lb

## 2018-07-16 DIAGNOSIS — C8331 Diffuse large B-cell lymphoma, lymph nodes of head, face, and neck: Secondary | ICD-10-CM | POA: Insufficient documentation

## 2018-07-16 DIAGNOSIS — Z79899 Other long term (current) drug therapy: Secondary | ICD-10-CM | POA: Diagnosis not present

## 2018-07-16 DIAGNOSIS — Z01810 Encounter for preprocedural cardiovascular examination: Secondary | ICD-10-CM

## 2018-07-16 LAB — CMP (CANCER CENTER ONLY)
ALT: 24 U/L (ref 0–44)
AST: 23 U/L (ref 15–41)
Albumin: 4 g/dL (ref 3.5–5.0)
Alkaline Phosphatase: 92 U/L (ref 38–126)
Anion gap: 11 (ref 5–15)
BUN: 16 mg/dL (ref 8–23)
CO2: 26 mmol/L (ref 22–32)
Calcium: 9.3 mg/dL (ref 8.9–10.3)
Chloride: 101 mmol/L (ref 98–111)
Creatinine: 0.82 mg/dL (ref 0.61–1.24)
GFR, Est AFR Am: >60 mL/min (ref >60)
GFR, Estimated: >60 mL/min (ref >60)
Glucose, Bld: 69 mg/dL — ABNORMAL LOW (ref 70–99)
Potassium: 3.9 mmol/L (ref 3.5–5.1)
Sodium: 138 mmol/L (ref 135–145)
Total Bilirubin: 0.4 mg/dL (ref 0.3–1.2)
Total Protein: 8.2 g/dL — ABNORMAL HIGH (ref 6.5–8.1)

## 2018-07-16 LAB — CBC WITH DIFFERENTIAL/PLATELET
Abs Immature Granulocytes: 0.04 10*3/uL (ref 0.00–0.07)
Basophils Absolute: 0.1 10*3/uL (ref 0.0–0.1)
Basophils Relative: 1 %
Eosinophils Absolute: 0.1 10*3/uL (ref 0.0–0.5)
Eosinophils Relative: 1 %
HCT: 43.2 % (ref 39.0–52.0)
Hemoglobin: 13.8 g/dL (ref 13.0–17.0)
Immature Granulocytes: 0 %
Lymphocytes Relative: 34 %
Lymphs Abs: 3 10*3/uL (ref 0.7–4.0)
MCH: 28.7 pg (ref 26.0–34.0)
MCHC: 31.9 g/dL (ref 30.0–36.0)
MCV: 89.8 fL (ref 80.0–100.0)
Monocytes Absolute: 0.9 10*3/uL (ref 0.1–1.0)
Monocytes Relative: 10 %
Neutro Abs: 4.9 10*3/uL (ref 1.7–7.7)
Neutrophils Relative %: 54 %
Platelets: 162 10*3/uL (ref 150–400)
RBC: 4.81 MIL/uL (ref 4.22–5.81)
RDW: 12.8 % (ref 11.5–15.5)
WBC: 8.9 10*3/uL (ref 4.0–10.5)
nRBC: 0 % (ref 0.0–0.2)

## 2018-07-16 LAB — RETICULOCYTES
Immature Retic Fract: 6.3 % (ref 2.3–15.9)
RBC.: 4.79 MIL/uL (ref 4.22–5.81)
Retic Count, Absolute: 58.4 10*3/uL (ref 19.0–186.0)
Retic Ct Pct: 1.2 % (ref 0.4–3.1)

## 2018-07-16 LAB — LACTATE DEHYDROGENASE: LDH: 217 U/L — ABNORMAL HIGH (ref 98–192)

## 2018-07-16 LAB — IMMATURE PLATELET FRACTION: Immature Platelet Fraction: 4.1 % (ref 1.2–8.6)

## 2018-07-17 ENCOUNTER — Ambulatory Visit (HOSPITAL_COMMUNITY)
Admission: RE | Admit: 2018-07-17 | Discharge: 2018-07-17 | Disposition: A | Discharge status: Home / Self Care | Payer: Medicare Other | Source: Ambulatory Visit | Attending: Hematology | Admitting: Hematology

## 2018-07-17 ENCOUNTER — Telehealth: Payer: Self-pay | Admitting: Hematology

## 2018-07-17 DIAGNOSIS — I10 Essential (primary) hypertension: Secondary | ICD-10-CM | POA: Insufficient documentation

## 2018-07-17 DIAGNOSIS — C8331 Diffuse large B-cell lymphoma, lymph nodes of head, face, and neck: Secondary | ICD-10-CM | POA: Insufficient documentation

## 2018-07-17 DIAGNOSIS — Z01810 Encounter for preprocedural cardiovascular examination: Secondary | ICD-10-CM | POA: Insufficient documentation

## 2018-07-17 DIAGNOSIS — I082 Rheumatic disorders of both aortic and tricuspid valves: Secondary | ICD-10-CM | POA: Insufficient documentation

## 2018-07-17 LAB — HEPATITIS C ANTIBODY: HCV Ab: <0.1 s/co ratio (ref 0.0–0.9)

## 2018-07-17 LAB — HEPATITIS B SURFACE ANTIGEN: Hepatitis B Surface Ag: NEGATIVE

## 2018-07-17 LAB — HEPATITIS B CORE ANTIBODY, TOTAL: Hep B Core Total Ab: NEGATIVE

## 2018-07-17 NOTE — Progress Notes (Signed)
  Echocardiogram 2D Echocardiogram has been performed.  Darlina Sicilian M 07/17/2018, 11:40 AM

## 2018-07-17 NOTE — Telephone Encounter (Signed)
Scheduld appt per 4/21 los.

## 2018-07-22 ENCOUNTER — Ambulatory Visit (HOSPITAL_COMMUNITY)
Admission: RE | Admit: 2018-07-22 | Discharge: 2018-07-22 | Disposition: A | Discharge status: Home / Self Care | Payer: Medicare Other | Source: Ambulatory Visit | Attending: Hematology | Admitting: Hematology

## 2018-07-22 ENCOUNTER — Other Ambulatory Visit: Payer: Self-pay

## 2018-07-22 DIAGNOSIS — I700 Atherosclerosis of aorta: Secondary | ICD-10-CM | POA: Insufficient documentation

## 2018-07-22 DIAGNOSIS — I2510 Atherosclerotic heart disease of native coronary artery without angina pectoris: Secondary | ICD-10-CM | POA: Insufficient documentation

## 2018-07-22 DIAGNOSIS — C8331 Diffuse large B-cell lymphoma, lymph nodes of head, face, and neck: Secondary | ICD-10-CM | POA: Insufficient documentation

## 2018-07-22 DIAGNOSIS — C8330 Diffuse large B-cell lymphoma, unspecified site: Secondary | ICD-10-CM | POA: Diagnosis not present

## 2018-07-22 LAB — GLUCOSE, CAPILLARY: Glucose-Capillary: 87 mg/dL (ref 70–99)

## 2018-07-22 MED ORDER — FLUDEOXYGLUCOSE F - 18 (FDG) INJECTION
8.3200 | Freq: Once | INTRAVENOUS | Status: AC | PRN
  Administered 2018-07-22: 11:00:00 8.32 via INTRAVENOUS

## 2018-07-25 NOTE — Progress Notes (Signed)
HEMATOLOGY/ONCOLOGY CLINIC NOTE  Date of Service: 07/26/2018  Patient Care Team: Denita Lung, MD as PCP - General (Family Medicine)  CHIEF COMPLAINTS/PURPOSE OF CONSULTATION:  Diffuse Large B-Cell Lymphoma  HISTORY OF PRESENTING ILLNESS:   Mario Garcia is a wonderful 85 y.o. male who has been referred to [place] by Dr. Jill Alexanders for evaluation and management of Diffuse Large B-Cell Lymphoma. The pt reports that he is doing well overall.   The pt reports that he feels that he has been a little more tired recently. He first noticed some "nodules" on his neck appear "4-6 weeks ago." He notes that these nodules haven't been painful. He appears to have presented to care with his PCP on 05/28/18, Dr. Redmond School. He notes that he has noticed that he has been sweating more in the last year. He denies noticing any other new lumps or bumps. He denies any fevers, chills, drenching night sweats, or unexpected weight loss. He notes that he has had some changes in swallowing over the last 6 months, which he characterizes as "getting strangled on his saliva every now and then." He denies abdominal pains, acid reflux, changes in bowel habits, leg swelling, skin rashes. He endorses a deep pain "every once in a while," in his left groin. He denies headaches. He endorses glaucoma and a history of cataracts. He sees Dr. Hetty Blend in ophthalmology. He denies any other concern in the last 6 months.  The pt notes that he lives independently with his wife and still is able to do all he wants to do. He walks on trails with his wife and notes that he can generally walk as far as he likes to.  The pt notes that he had prostate cancer in 2008 or 2009, s/p prostatectomy. He did not require RT nor systemic treatments. He denies lung, heart or kidney problems.  Of note prior to the patient's visit today, pt has had a CT Neck completed on 06/05/18 with results revealing Pathologic RIGHT-sided level II, III, IV, and V  lymphadenopathy, most consistent with metastatic carcinoma. An obvious primary source is not identified. Tissue sampling is warranted.  Most recent lab results (07/08/18) of CBC and CMP is as follows: all values are WNL except for PLT at 129k.  On review of systems, pt reports slightly more tired, neck nodules, some changes in swallowing, staying active, and denies fevers, chills, drenching night sweats, unexpected weight loss, abdominal pains, changes in bowel habits, skin rashes, leg swelling, CP, SOB, difficulty breathing, headaches, other lumps or bumps, skin lesions, mouth sores, pain along the spine, back pain, leg swelling, and any other symptoms.   On PMHx the pt reports localized Prostate cancer in 2008 s/p prostatectomy.  On Social Hx the pt reports that he quit smoking over 50 years ago. He notes that he consumes about 1 glass of wine every day, without concerns for excessive drinking. 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 He denies concern for chemical or radiation exposure. On Family Hx the pt reports wife with Stage IV Non Hodgkin's Lymphoma, paternal grandmother with leukemia in her 9s, father with unspecified abdominal cancer.  Interval History:   Mario Garcia returns today for management and evaluation of his newly diagnosed Diffuse Large B-Cell Lymphoma. The patient's last visit with [place] was on 07/16/18. The pt is accompanied today by his wife, Rod Holler, via [employer]. The pt reports that he is doing well overall.  The  pt reports that he has not developed new concerns in the interim. He endorses good energy levels overall. He denies abdominal pains but does endorse some persistent constipation, which began after he took some pain medication following his biopsy.  The pt notes that his right cervical lymph node has become a little more prominent in the interim.  Of note since the patient's last visit, pt has had a PET/CT completed on 07/22/18 with  results revealing "Hypermetabolic adenopathy especially concentrated in the right neck but also in the left neck, chest, abdomen, and pelvis. The adenopathy is primarily Deauville 5 although some few scattered smaller lesions are Deauville 4. 2. Diffuse abnormal splenic activity, Deauville 5, without overt splenomegaly. 3. There is also hypermetabolic Deauville 5 activity in the mildly thickened distal appendix, and raising suspicion for involvement of the appendix. 4. Other imaging findings of potential clinical significance: Aortic Atherosclerosis. Coronary atherosclerosis."  Lab results (07/16/18) of CBC w/diff and CMP is as follows: all values are WNL except for Glucose at 69, Total Protein at 8.2. 07/16/18 LDH at 217  On review of systems, pt reports stable energy levels, some constipation, grown right cervical lymph node, and denies abdominal pains, noticing any other lumps or bumps, leg swelling, and any other symptoms.   MEDICAL HISTORY:  Past Medical History:  Diagnosis Date  . Cancer ([facility])    PROSTATE  . History of colonic polyps   . History of kidney stones   . Hypertension   . Inguinal hernia 03/2002  . Pneumonia    "years ago"    SURGICAL HISTORY: Past Surgical History:  Procedure Laterality Date  . COLONOSCOPY    . DIRECT LARYNGOSCOPY N/A 07/08/2018   Procedure: DIRECT LARYNGOSCOPY;  Surgeon: Leta Baptist, MD;  Location: [place];  Service: ENT;  Laterality: N/A;  . ESOPHAGOSCOPY N/A 07/08/2018   Procedure: ESOPHAGOSCOPY;  Surgeon: Leta Baptist, MD;  Location: [place];  Service: ENT;  Laterality: N/A;  . FLEXIBLE BRONCHOSCOPY N/A 07/08/2018   Procedure: FLEXIBLE BRONCHOSCOPY;  Surgeon: Leta Baptist, MD;  Location: [place];  Service: ENT;  Laterality: N/A;  . HERNIA REPAIR Left    inguinal hernia  . MASS BIOPSY Right 07/08/2018   Procedure: RIGHT NECK MASS EXCISIONAL BIOPSY;  Surgeon: Leta Baptist, MD;  Location: [place];  Service: ENT;  Laterality: Right;  . PATELLA FRACTURE SURGERY Left   .  PROSTATE SURGERY  10/2005   RADIAL PROSTATECTOMY  . ROTATOR CUFF REPAIR Bilateral     SOCIAL HISTORY: Social History   Socioeconomic History  . Marital status: Married    Spouse name: Not on file  . Number of children: Not on file  . Years of education: Not on file  . Highest education level: Not on file  Occupational History  . Not on file  Social Needs  . Financial resource strain: Not on file  . Food insecurity:    Worry: Not on file    Inability: Not on file  . Transportation needs:    Medical: Not on file    Non-medical: Not on file  Tobacco Use  . Smoking status: Former Research scientist (life sciences)  . Smokeless tobacco: Never Used  . Tobacco comment: stopped 60 years ago  Substance and Sexual Activity  . Alcohol use: Yes    Alcohol/week: 6.0 standard drinks    Types: 6 Standard drinks or equivalent per week  . Drug use: No  . Sexual activity: Not Currently  Lifestyle  . Physical activity:    Days per  week: Not on file    Minutes per session: Not on file  . Stress: Not on file  Relationships  . Social connections:    Talks on phone: Not on file    Gets together: Not on file    Attends religious service: Not on file    Active member of club or organization: Not on file    Attends meetings of clubs or organizations: Not on file    Relationship status: Not on file  . Intimate partner violence:    Fear of current or ex partner: Not on file    Emotionally abused: Not on file    Physically abused: Not on file    Forced sexual activity: Not on file  Other Topics Concern  . Not on file  Social History Narrative  . Not on file    FAMILY HISTORY: Family History  Problem Relation Age of Onset  . Cancer Father   . Cancer Brother     ALLERGIES:  is allergic to lisinopril and penicillins.  MEDICATIONS:  Current Outpatient Medications  Medication Sig Dispense Refill  . Carboxymethylcellulose Sodium (ARTIFICIAL TEARS OP) Place 1 drop into both eyes daily as needed (dry eyes).     . dorzolamide (TRUSOPT) 2 % ophthalmic solution Place 1 drop into both eyes 2 (two) times daily.    Marland Kitchen latanoprost (XALATAN) 0.005 % ophthalmic solution Place 1 drop into both eyes at bedtime.    Marland Kitchen losartan-hydrochlorothiazide (HYZAAR) 50-12.5 MG tablet TAKE 1 TABLET BY MOUTH EVERY DAY (Patient taking differently: Take 1 tablet by mouth daily with lunch. ) 90 tablet 0  . oxyCODONE-acetaminophen (PERCOCET) 5-325 MG tablet Take 1 tablet by mouth every 4 (four) hours as needed for severe pain. 20 tablet 0  . pravastatin (PRAVACHOL) 40 MG tablet TAKE 1 TABLET BY MOUTH ONCE DAILY (Patient taking differently: Take 40 mg by mouth daily with lunch. ) 90 tablet 2   No current facility-administered medications for this visit.     REVIEW OF SYSTEMS:    A 10+ POINT REVIEW OF SYSTEMS WAS OBTAINED including neurology, dermatology, psychiatry, cardiac, respiratory, lymph, extremities, GI, GU, Musculoskeletal, constitutional, breasts, reproductive, HEENT.  All pertinent positives are noted in the HPI.  All others are negative.   PHYSICAL EXAMINATION: ECOG PERFORMANCE STATUS: 1 - Symptomatic but completely ambulatory  Vitals:   07/26/18 0952  BP: (!) 143/64  Pulse: 65  Resp: 17  Temp: 97.9 F (36.6 C)  SpO2: 100%   Filed Weights   07/26/18 0952  Weight: 165 lb 12.8 oz (75.2 kg)   .Body mass index is 23.79 kg/m.  GENERAL:alert, in no acute distress and comfortable SKIN: no acute rashes, no significant lesions EYES: conjunctiva are pink and non-injected, sclera anicteric OROPHARYNX: MMM, no exudates, no oropharyngeal erythema or ulceration NECK: supple, no JVD LYMPH: Multiple palpable right cervical lymph nodes ranging in size from 2-3cm up to 4cm. Palpable right supraclavicular Level IV and Level V lymph nodes. No palpable lymphadenopathy in the axillary or inguinal regions LUNGS: clear to auscultation b/l with normal respiratory effort HEART: regular rate & rhythm ABDOMEN:  normoactive bowel  sounds , non tender, not distended. No palpable hepatosplenomegaly.  Extremity: no pedal edema PSYCH: alert & oriented x 3 with fluent speech NEURO: no focal motor/sensory deficits   LABORATORY DATA:  I have reviewed the data as listed  . CBC Latest Ref Rng & Units 07/16/2018 07/08/2018 05/28/2018  WBC 4.0 - 10.5 K/uL 8.9 7.0 10.4  Hemoglobin 13.0 -  17.0 g/dL 13.8 13.0 13.6  Hematocrit 39.0 - 52.0 % 43.2 40.9 40.8  Platelets 150 - 400 K/uL 162 129(L) 134(L)    . CMP Latest Ref Rng & Units 07/16/2018 07/08/2018 05/28/2018  Glucose 70 - 99 mg/dL 69(L) 98 87  BUN 8 - 23 mg/dL _0 Creatinine 0.61 - 1.24 mg/dL 0.82 0.89 1.03  Sodium 135 - 145 mmol/L 138 139 140  Potassium 3.5 - 5.1 mmol/L 3.9 3.6 3.9  Chloride 98 - 111 mmol/L 101 101 98  CO2 22 - 32 mmol/L _1 Calcium 8.9 - 10.3 mg/dL 9.3 9.0 9.5  Total Protein 6.5 - 8.1 g/dL 8.2(H) 6.9 7.1  Total Bilirubin 0.3 - 1.2 mg/dL 0.4 0.4 0.5  Alkaline Phos 38 - 126 U/L 92 73 81  AST 15 - 41 U/L _2 ALT 0 - 44 U/L _3 . Lab Results  Component Value Date   LDH 217 (H) 07/16/2018    07/08/18 Right Cervical Tissue Biopsy:    RADIOGRAPHIC STUDIES: I have personally reviewed the radiological images as listed and agreed with the findings in the report. Nm Pet Image Initial (pi) Skull Base To Thigh  Result Date: 07/22/2018 CLINICAL DATA:  Initial treatment strategy for diffuse large B-cell lymphoma. EXAM: NUCLEAR MEDICINE PET SKULL BASE TO THIGH TECHNIQUE: 8.3 mCi F-18 FDG was injected intravenously. Full-ring PET imaging was performed from the skull base to thigh after the radiotracer. CT data was obtained and used for attenuation correction and anatomic localization. Fasting blood glucose: 87 mg/dl COMPARISON:  Multiple exams, including CT neck from 06/05/2018 FINDINGS: Mediastinal blood pool activity: SUV max 2.4 Background hepatic parenchymal activity: Maximum SUV 3.3 NECK: Notable hypermetabolic bilateral IIa, along  with right IIb, III, IV, and V adenopathy observed. A right II B lymph node measuring 2.4 cm in short axis on image 28/4 has a maximum SUV of 12.3 clustered right V adenopathy individually measuring up to 1.3 cm in diameter on image 35/4 with maximum SUV 15.3. The smaller left IIa lymph node measures 0.9 cm in short axis on image 26/4 with maximum SUV 8.1. All of these are Deauville 5. There is bilateral hypermetabolic adenopathy of the palatine tonsils, maximum SUV 17.0 on the right and 9.3 on the left, Deauville 5. A small left level Ib lymph node measuring 0.6 cm in short axis on image 31/4 has a maximum SUV of 4.9, Deauville 4. Incidental CT findings: Bilateral common carotid atherosclerotic calcification. CHEST: Scattered small hypermetabolic lymph nodes are present in the chest, including bilateral axillary nodes, a left internal mammary node, and paratracheal and subcarinal lymph nodes. An index right axillary node with parenchymal portion measuring 0.5 cm in thickness on image 50/4 has a maximum SUV of 11.3, Deauville 5. A subcarinal node measuring 0.7 cm in short axis on image 71/4 has a maximum SUV of 5.8, Deauville 4. A left internal mammary node measuring 0.4 cm in short axis on image 62/4 has a maximum SUV of 4.3, Deauville 4. There is also low-grade accentuated activity along the hila without easily measured lymph nodes. For example, right hilar activity has maximum SUV of 3.9 (Deauville 4). Incidental CT findings: Coronary, aortic arch, and branch vessel atherosclerotic vascular disease. Biapical pleuroparenchymal scarring. Subpleural lymph node measuring 4 mm along the minor fissure and subpleural lymph node along the left major fissure measuring 0.5 cm on image 35/8, no significant degree of hypermetabolic activity associated with either of the  small lesions. 5 mm subpleural nodule in the right lower lobe on image 50/8, not appreciably hypermetabolic but below sensitive PET-CT size thresholds.  Similar faint subpleural nodule in the left lower lobe on image 48/8. ABDOMEN/PELVIS: No splenomegaly but diffuse hypermetabolic activity throughout the spleen, maximum SUV 11.5 (Deauville 5). The spleen measures 10.2 by 5.7 by 10.4 cm (volume = 320 cm^3). Hypermetabolic porta hepatis/peripancreatic, retroperitoneal, mesenteric, common iliac, external iliac, and inguinal adenopathy. Index portacaval node measuring 1.0 cm in short axis on image 111/4 with maximum SUV 9.3, Deauville 5. Index left upper quadrant mesenteric node measuring 1.3 cm in short axis on image 119/4, maximum SUV 7.6, Deauville 5. Index left external iliac node 0.8 cm in short axis on image 176/4, maximum SUV 10.9 comment of L5. There is accentuated metabolic activity in the appendix which measures up to 7 mm in thickness, borderline abnormally thickened. Maximum SUV in this distal portion of the appendix is 9.5, Deauville 5. Incidental CT findings: Aortoiliac atherosclerotic vascular disease. Mesenteric stranding. Empty urinary bladder with benign adipose density in the bladder wall. Sigmoid diverticulosis. SKELETON: No significant abnormal hypermetabolic activity in this region. Incidental CT findings: Lower lumbar spondylosis and degenerative disc disease. IMPRESSION: 1. Hypermetabolic adenopathy especially concentrated in the right neck but also in the left neck, chest, abdomen, and pelvis. The adenopathy is primarily Deauville 5 although some few scattered smaller lesions are Deauville 4. 2. Diffuse abnormal splenic activity, Deauville 5, without overt splenomegaly. 3. There is also hypermetabolic Deauville 5 activity in the mildly thickened distal appendix, and raising suspicion for involvement of the appendix. 4. Other imaging findings of potential clinical significance: Aortic Atherosclerosis (ICD10-I70.0). Coronary atherosclerosis. Electronically Signed   By: Van Clines M.D.   On: 07/22/2018 15:38    ASSESSMENT & PLAN:   85  y.o. male with  1. Diffuse Large B-Cell Lymphoma Presenting without constitutional symptoms. Palpable right cervical and supraclavicular lymphadenopathy. 07/08/18 Right cervical soft tissue biopsy revealed Diffuse Large B-Cell Lymphoma, germinal center type  06/05/18 CT Neck revealed Pathologic RIGHT-sided level II, III, IV, and V lymphadenopathy, most consistent with metastatic carcinoma. An obvious primary source is not identified. Tissue sampling is warranted. 07/16/18 Hep B and Hep C negative  PLAN: -Discussed pt labwork 07/16/18; blood counts are normal, chemistries are stable. LDH at 217. -Discussed the 07/17/18 ECHO which revealed LV EF of 60-65% -Discussed the 07/22/18 PET/CT which revealed "Hypermetabolic adenopathy especially concentrated in the right neck but also in the left neck, chest, abdomen, and pelvis. The adenopathy is primarily Deauville 5 although some few scattered smaller lesions are Deauville 4. 2. Diffuse abnormal splenic activity, Deauville 5, without overt splenomegaly. 3. There is also hypermetabolic Deauville 5 activity in the mildly thickened distal appendix, and raising suspicion for involvement of the appendix. 4. Other imaging findings of potential clinical significance: Aortic Atherosclerosis. Coronary atherosclerosis." -Discussed that the staging indicated is Stage IV, but is still potentially curable -Discussed the NCCN guidelines with the pt and his wife who is on the phone. -Recommend R-mini-CHOP up to 6 cycles with G-CSF support and possibly consolidative RT. Will watch patient's blood counts for display of tolerance and possible mild dose escalation. -Discussed the characteristics of DLBCL which increase risk of brain recurrence. Pt is not high risk category, but is intermediate risk given bowel involvement. At pt's age, I do not recommend prophylactic IT MTX with concerns for tolerance and potentially causing delays in primary induction treatment. -Advised  infection prevention strategies, crowd avoidance, and frequent hand  washing -Recommend Miralax, and Senna S at night time -Will set pt up for virtual chemotherapy counseling -Will refer the pt to IR for port placement, which pt prefers -Will plan to begin C1 R-mini-CHOP with G-CSF support in 7-10 days -Will see the pt back with toxicity check in 14-17 days   IR for port placement on 5/4 or 5/5/ Chemo-counseling for R-CHOP with neulasta Schedule to start R-CHOP with neulasta in 7 days with labs RTC with Dr Irene Limbo 1 week after C1 of chemotherapy with labs for a toxicity check    All of the patients questions were answered with apparent satisfaction. The patient knows to call the clinic with any problems, questions or concerns.  The total time spent in the appt was 35 minutes and more than 50% was on counseling and direct patient cares.    Sullivan Lone MD MS AAHIVMS [facility] Hematology/Oncology Physician [facility]  (Office):       [phone] (Work cell):  [phone] (Fax):           [phone]  07/26/2018 11:00 AM  I, Baldwin Jamaica, am acting as a scribe for Dr. Sullivan Lone.   .I have reviewed the above documentation for accuracy and completeness, and I agree with the above. Brunetta Genera MD

## 2018-07-26 ENCOUNTER — Telehealth: Payer: Self-pay | Admitting: Hematology

## 2018-07-26 ENCOUNTER — Inpatient Hospital Stay: Payer: Medicare Other | Attending: Hematology | Admitting: Hematology

## 2018-07-26 ENCOUNTER — Other Ambulatory Visit: Payer: Self-pay

## 2018-07-26 VITALS — BP 143/64 | HR 65 | Temp 97.9°F | Resp 17 | Ht 70.0 in | Wt 165.8 lb

## 2018-07-26 DIAGNOSIS — Z5111 Encounter for antineoplastic chemotherapy: Secondary | ICD-10-CM | POA: Insufficient documentation

## 2018-07-26 DIAGNOSIS — Z79899 Other long term (current) drug therapy: Secondary | ICD-10-CM | POA: Diagnosis not present

## 2018-07-26 DIAGNOSIS — C8331 Diffuse large B-cell lymphoma, lymph nodes of head, face, and neck: Secondary | ICD-10-CM | POA: Insufficient documentation

## 2018-07-26 MED ORDER — PROCHLORPERAZINE MALEATE 10 MG PO TABS: 10.0000 mg | ORAL_TABLET | Freq: Four times a day (QID) | ORAL | 6 refills | Status: DC | PRN

## 2018-07-26 MED ORDER — ONDANSETRON HCL 8 MG PO TABS: 8.0000 mg | ORAL_TABLET | Freq: Two times a day (BID) | ORAL | 1 refills | Status: DC | PRN

## 2018-07-26 MED ORDER — ALLOPURINOL 300 MG PO TABS: 150.0000 mg | ORAL_TABLET | Freq: Every day | ORAL | 0 refills | Status: DC

## 2018-07-26 MED ORDER — PREDNISONE 20 MG PO TABS: 60.0000 mg | ORAL_TABLET | Freq: Every day | ORAL | 6 refills | Status: DC

## 2018-07-26 MED ORDER — LIDOCAINE-PRILOCAINE 2.5-2.5 % EX CREA: TOPICAL_CREAM | CUTANEOUS | 3 refills | Status: DC

## 2018-07-26 NOTE — Progress Notes (Signed)
START ON PATHWAY REGIMEN - Lymphoma and CLL     A cycle is every 21 days:     Prednisone      Rituximab-xxxx      Cyclophosphamide      Doxorubicin      Vincristine   **Always confirm dose/schedule in your pharmacy ordering system**  Patient Characteristics: Diffuse Large B-Cell Lymphoma or Follicular Lymphoma, Grade 3B, First Line, Stage III and IV Disease Type: Not Applicable Disease Type: Diffuse Large B-Cell Lymphoma Disease Type: Not Applicable Line of therapy: First Line [place] Stage: IV Intent of Therapy: Curative Intent, Discussed with Patient 

## 2018-07-26 NOTE — Telephone Encounter (Signed)
Scheduled appts per 5/1 los.  Called patient and patient aware of scheduled appt date and time.

## 2018-07-29 ENCOUNTER — Inpatient Hospital Stay: Payer: Medicare Other

## 2018-07-29 ENCOUNTER — Telehealth: Payer: Self-pay | Admitting: *Deleted

## 2018-07-29 ENCOUNTER — Other Ambulatory Visit: Payer: Self-pay | Admitting: Radiology

## 2018-07-30 ENCOUNTER — Ambulatory Visit (HOSPITAL_COMMUNITY)
Admission: RE | Admit: 2018-07-30 | Discharge: 2018-07-30 | Disposition: A | Discharge status: Home / Self Care | Payer: Medicare Other | Source: Ambulatory Visit | Attending: Hematology | Admitting: Hematology

## 2018-07-30 ENCOUNTER — Other Ambulatory Visit: Payer: Self-pay

## 2018-07-30 ENCOUNTER — Encounter (HOSPITAL_COMMUNITY): Payer: Self-pay

## 2018-07-30 DIAGNOSIS — C8331 Diffuse large B-cell lymphoma, lymph nodes of head, face, and neck: Secondary | ICD-10-CM | POA: Insufficient documentation

## 2018-07-30 DIAGNOSIS — Z79899 Other long term (current) drug therapy: Secondary | ICD-10-CM | POA: Insufficient documentation

## 2018-07-30 DIAGNOSIS — Z8546 Personal history of malignant neoplasm of prostate: Secondary | ICD-10-CM | POA: Diagnosis not present

## 2018-07-30 DIAGNOSIS — Z87891 Personal history of nicotine dependence: Secondary | ICD-10-CM | POA: Diagnosis not present

## 2018-07-30 DIAGNOSIS — I10 Essential (primary) hypertension: Secondary | ICD-10-CM | POA: Insufficient documentation

## 2018-07-30 DIAGNOSIS — C8330 Diffuse large B-cell lymphoma, unspecified site: Secondary | ICD-10-CM | POA: Diagnosis not present

## 2018-07-30 DIAGNOSIS — Z452 Encounter for adjustment and management of vascular access device: Secondary | ICD-10-CM | POA: Diagnosis not present

## 2018-07-30 HISTORY — PX: IR IMAGING GUIDED PORT INSERTION: IMG5740

## 2018-07-30 LAB — CBC WITH DIFFERENTIAL/PLATELET
Abs Immature Granulocytes: 0.03 10*3/uL (ref 0.00–0.07)
Basophils Absolute: 0 10*3/uL (ref 0.0–0.1)
Basophils Relative: 0 %
Eosinophils Absolute: 0.1 10*3/uL (ref 0.0–0.5)
Eosinophils Relative: 1 %
HCT: 43.9 % (ref 39.0–52.0)
Hemoglobin: 13.9 g/dL (ref 13.0–17.0)
Immature Granulocytes: 0 %
Lymphocytes Relative: 39 %
Lymphs Abs: 3.2 10*3/uL (ref 0.7–4.0)
MCH: 29 pg (ref 26.0–34.0)
MCHC: 31.7 g/dL (ref 30.0–36.0)
MCV: 91.5 fL (ref 80.0–100.0)
Monocytes Absolute: 1.2 10*3/uL — ABNORMAL HIGH (ref 0.1–1.0)
Monocytes Relative: 15 %
Neutro Abs: 3.6 10*3/uL (ref 1.7–7.7)
Neutrophils Relative %: 45 %
Platelets: 144 10*3/uL — ABNORMAL LOW (ref 150–400)
RBC: 4.8 MIL/uL (ref 4.22–5.81)
RDW: 12.9 % (ref 11.5–15.5)
WBC: 8.1 10*3/uL (ref 4.0–10.5)
nRBC: 0 % (ref 0.0–0.2)

## 2018-07-30 LAB — PROTIME-INR
INR: 1 (ref 0.8–1.2)
Prothrombin Time: 12.6 seconds (ref 11.4–15.2)

## 2018-07-30 MED ORDER — FENTANYL CITRATE (PF) 100 MCG/2ML IJ SOLN
INTRAMUSCULAR | Status: AC | PRN
  Administered 2018-07-30 (×2): 50 ug via INTRAVENOUS

## 2018-07-30 MED ORDER — LIDOCAINE-EPINEPHRINE 1 %-1:100000 IJ SOLN
INTRAMUSCULAR | Status: AC
  Filled 2018-07-30: qty 1

## 2018-07-30 MED ORDER — SODIUM CHLORIDE 0.9 % IV SOLN
INTRAVENOUS | Status: DC
  Administered 2018-07-30: 12:00:00 via INTRAVENOUS

## 2018-07-30 MED ORDER — HEPARIN SOD (PORK) LOCK FLUSH 100 UNIT/ML IV SOLN
INTRAVENOUS | Status: AC | PRN
  Administered 2018-07-30: 500 [IU] via INTRAVENOUS

## 2018-07-30 MED ORDER — HEPARIN SOD (PORK) LOCK FLUSH 100 UNIT/ML IV SOLN
INTRAVENOUS | Status: AC
  Filled 2018-07-30: qty 5

## 2018-07-30 MED ORDER — LIDOCAINE-EPINEPHRINE (PF) 2 %-1:200000 IJ SOLN
INTRAMUSCULAR | Status: AC | PRN
  Administered 2018-07-30 (×2): 10 mL

## 2018-07-30 MED ORDER — CLINDAMYCIN PHOSPHATE 900 MG/50ML IV SOLN
900.0000 mg | Freq: Once | INTRAVENOUS | Status: AC
  Administered 2018-07-30: 900 mg via INTRAVENOUS

## 2018-07-30 MED ORDER — CLINDAMYCIN PHOSPHATE 900 MG/50ML IV SOLN
INTRAVENOUS | Status: AC
  Administered 2018-07-30: 900 mg via INTRAVENOUS
  Filled 2018-07-30: qty 50

## 2018-07-30 MED ORDER — FENTANYL CITRATE (PF) 100 MCG/2ML IJ SOLN
INTRAMUSCULAR | Status: AC
  Filled 2018-07-30: qty 2

## 2018-07-30 MED ORDER — MIDAZOLAM HCL 2 MG/2ML IJ SOLN
INTRAMUSCULAR | Status: AC | PRN
  Administered 2018-07-30 (×2): 1 mg via INTRAVENOUS

## 2018-07-30 MED ORDER — MIDAZOLAM HCL 2 MG/2ML IJ SOLN
INTRAMUSCULAR | Status: AC
  Filled 2018-07-30: qty 2

## 2018-07-30 NOTE — Discharge Instructions (Addendum)
There are no changes to your home medications.   You have a card which will go into your wallet. Show it to any MD that you go to that you have a port.   Do not use EMLA cream on your port until it has healed. The nurses at the [facility] infusion room will help you to determine when it has healed. EMLA cream will dissolve the skin glue and result in an infection of your new port. Use ice in a zip lock bag over your new port for 3-4 minutes prior to needle insertion.     Implanted Port Insertion, Care After This sheet gives you information about how to care for yourself after your procedure. Your health care provider may also give you more specific instructions. If you have problems or questions, contact your health care provider. What can I expect after the procedure? After the procedure, it is common to have:  Discomfort at the port insertion site.  Bruising on the skin over the port. This should improve over 3-4 days. Follow these instructions at home: [facility] care  After your port is placed, you will get a manufacturer's information card. The card has information about your port. Keep this card with you at all times.  Take care of the port as told by your health care provider. Ask your health care provider if you or a family member can get training for taking care of the port at home. A home health care nurse may also take care of the port.  Make sure to remember what type of port you have. Incision care      Follow instructions from your health care provider about how to take care of your port insertion site. Make sure you: ? Wash your hands with soap and water before and after you change your bandage (dressing). If soap and water are not available, use hand sanitizer. ? Change your dressing as told by your health care provider. ? Leave stitches (sutures), skin glue, or adhesive strips in place. These skin closures may need to stay in place for 2 weeks or longer. If adhesive  strip edges start to loosen and curl up, you may trim the loose edges. Do not remove adhesive strips completely unless your health care provider tells you to do that.  Check your port insertion site every day for signs of infection. Check for: ? Redness, swelling, or pain. ? Fluid or blood. ? Warmth. ? Pus or a bad smell. Activity  Return to your normal activities as told by your health care provider. Ask your health care provider what activities are safe for you.  Do not lift anything that is heavier than 10 lb (4.5 kg), or the limit that you are told, until your health care provider says that it is safe. General instructions  Take over-the-counter and prescription medicines only as told by your health care provider.  Do not take baths, swim, or use a hot tub until your health care provider approves. Ask your health care provider if you may take showers. You may only be allowed to take sponge baths.  Do not drive for 24 hours if you were given a sedative during your procedure.  Wear a medical alert bracelet in case of an emergency. This will tell any health care providers that you have a port.  Keep all follow-up visits as told by your health care provider. This is important. Contact a health care provider if:  You cannot flush your port with  saline as directed, or you cannot draw blood from the port.  You have a fever or chills.  You have redness, swelling, or pain around your port insertion site.  You have fluid or blood coming from your port insertion site.  Your port insertion site feels warm to the touch.  You have pus or a bad smell coming from the port insertion site. Get help right away if:  You have chest pain or shortness of breath.  You have bleeding from your port that you cannot control. Summary  Take care of the port as told by your health care provider. Keep the manufacturer's information card with you at all times.  Change your dressing as told by your  health care provider.  Contact a health care provider if you have a fever or chills or if you have redness, swelling, or pain around your port insertion site.  Keep all follow-up visits as told by your health care provider. This information is not intended to replace advice given to you by your health care provider. Make sure you discuss any questions you have with your health care provider. Document Released: 01/01/2013 Document Revised: 10/09/2017 Document Reviewed: 10/09/2017 Elsevier Interactive Patient Education  2019 [place].     Moderate Conscious Sedation, Adult, Care After These instructions provide you with information about caring for yourself after your procedure. Your health care provider may also give you more specific instructions. Your treatment has been planned according to current medical practices, but problems sometimes occur. Call your health care provider if you have any problems or questions after your procedure. What can I expect after the procedure? After your procedure, it is common:  To feel sleepy for several hours.  To feel clumsy and have poor balance for several hours.  To have poor judgment for several hours.  To vomit if you eat too soon. Follow these instructions at home: For at least 24 hours after the procedure:   Do not: ? Participate in activities where you could fall or become injured. ? Drive. ? Use heavy machinery. ? Drink alcohol. ? Take sleeping pills or medicines that cause drowsiness. ? Make important decisions or sign legal documents. ? Take care of children on your own.  Rest. Eating and drinking  Follow the diet recommended by your health care provider.  If you vomit: ? Drink water, juice, or soup when you can drink without vomiting. ? Make sure you have little or no nausea before eating solid foods. General instructions  Have a responsible adult stay with you until you are awake and alert.  Take over-the-counter  and prescription medicines only as told by your health care provider.  If you smoke, do not smoke without supervision.  Keep all follow-up visits as told by your health care provider. This is important. Contact a health care provider if:  You keep feeling nauseous or you keep vomiting.  You feel light-headed.  You develop a rash.  You have a fever. Get help right away if:  You have trouble breathing. This information is not intended to replace advice given to you by your health care provider. Make sure you discuss any questions you have with your health care provider. Document Released: 01/01/2013 Document Revised: 08/16/2015 Document Reviewed: 07/03/2015 Elsevier Interactive Patient Education  2019 [employer].

## 2018-07-30 NOTE — Consult Note (Signed)
Chief Complaint: Patient was seen in consultation today for port a cath placement  Referring Physician(s): Brunetta Genera  Supervising Physician: Jacqulynn Cadet  Patient Status: [facility] - Out-pt  History of Present Illness: Mario Garcia is an 85 y.o. male with remote history of prostate cancer and now with newly diagnosed diffuse large B-cell lymphoma who presents today for Port-A-Cath placement for chemotherapy.  Past Medical History:  Diagnosis Date  . Cancer ([place])    PROSTATE  . History of colonic polyps   . History of kidney stones   . Hypertension   . Inguinal hernia 03/2002  . Pneumonia    "years ago"    Past Surgical History:  Procedure Laterality Date  . COLONOSCOPY    . DIRECT LARYNGOSCOPY N/A 07/08/2018   Procedure: DIRECT LARYNGOSCOPY;  Surgeon: Leta Baptist, MD;  Location: [facility];  Service: ENT;  Laterality: N/A;  . ESOPHAGOSCOPY N/A 07/08/2018   Procedure: ESOPHAGOSCOPY;  Surgeon: Leta Baptist, MD;  Location: [facility];  Service: ENT;  Laterality: N/A;  . FLEXIBLE BRONCHOSCOPY N/A 07/08/2018   Procedure: FLEXIBLE BRONCHOSCOPY;  Surgeon: Leta Baptist, MD;  Location: [facility];  Service: ENT;  Laterality: N/A;  . HERNIA REPAIR Left    inguinal hernia  . MASS BIOPSY Right 07/08/2018   Procedure: RIGHT NECK MASS EXCISIONAL BIOPSY;  Surgeon: Leta Baptist, MD;  Location: [facility];  Service: ENT;  Laterality: Right;  . PATELLA FRACTURE SURGERY Left   . PROSTATE SURGERY  10/2005   RADIAL PROSTATECTOMY  . ROTATOR CUFF REPAIR Bilateral     Allergies: Lisinopril and Penicillins  Medications: Prior to Admission medications   Medication Sig Start Date End Date Taking? Authorizing Provider  Carboxymethylcellulose Sodium (ARTIFICIAL TEARS OP) Place 1 drop into both eyes daily as needed (dry eyes).   Yes [provider]  dorzolamide (TRUSOPT) 2 % ophthalmic solution Place 1 drop into both eyes 2 (two) times daily.   Yes [provider]  latanoprost (XALATAN) 0.005 %  ophthalmic solution Place 1 drop into both eyes at bedtime.   Yes [provider]  losartan-hydrochlorothiazide (HYZAAR) 50-12.5 MG tablet TAKE 1 TABLET BY MOUTH EVERY DAY Patient taking differently: Take 1 tablet by mouth daily with lunch.  06/06/18  Yes Denita Lung, MD  oxyCODONE-acetaminophen (PERCOCET) 5-325 MG tablet Take 1 tablet by mouth every 4 (four) hours as needed for severe pain. 07/08/18  Yes Leta Baptist, MD  pravastatin (PRAVACHOL) 40 MG tablet TAKE 1 TABLET BY MOUTH ONCE DAILY Patient taking differently: Take 40 mg by mouth daily with lunch.  04/22/18  Yes Denita Lung, MD  allopurinol (ZYLOPRIM) 300 MG tablet Take 0.5 tablets (150 mg total) by mouth daily. 07/26/18   Brunetta Genera, MD  lidocaine-prilocaine (EMLA) cream Apply to affected area once 07/26/18   Brunetta Genera, MD  ondansetron (ZOFRAN) 8 MG tablet Take 1 tablet (8 mg total) by mouth 2 (two) times daily as needed for refractory nausea / vomiting. Start on day 3 after cyclophosphamide chemotherapy. 07/26/18   Brunetta Genera, MD  predniSONE (DELTASONE) 20 MG tablet Take 3 tablets (60 mg total) by mouth daily. Take on days 1-5 of chemotherapy. 07/26/18   Brunetta Genera, MD  prochlorperazine (COMPAZINE) 10 MG tablet Take 1 tablet (10 mg total) by mouth every 6 (six) hours as needed (Nausea or vomiting). 07/26/18   Brunetta Genera, MD     Family History  Problem Relation Age of Onset  . Cancer Father   .  Cancer Brother     Social History   Socioeconomic History  . Marital status: Married    Spouse name: Not on file  . Number of children: Not on file  . Years of education: Not on file  . Highest education level: Not on file  Occupational History  . Not on file  Social Needs  . Financial resource strain: Not on file  . Food insecurity:    Worry: Not on file    Inability: Not on file  . Transportation needs:    Medical: Not on file    Non-medical: Not on file  Tobacco Use  .  Smoking status: Former Research scientist (life sciences)  . Smokeless tobacco: Never Used  . Tobacco comment: stopped 60 years ago  Substance and Sexual Activity  . Alcohol use: Yes    Alcohol/week: 6.0 standard drinks    Types: 6 Standard drinks or equivalent per week  . Drug use: No  . Sexual activity: Not Currently  Lifestyle  . Physical activity:    Days per week: Not on file    Minutes per session: Not on file  . Stress: Not on file  Relationships  . Social connections:    Talks on phone: Not on file    Gets together: Not on file    Attends religious service: Not on file    Active member of club or organization: Not on file    Attends meetings of clubs or organizations: Not on file    Relationship status: Not on file  Other Topics Concern  . Not on file  Social History Narrative  . Not on file      Review of Systems currently denies fever, headache, chest pain, dyspnea, cough, abdominal/back pain, nausea, vomiting or bleeding.  Vital Signs: Blood pressure 159/71, heart rate 54, temp 98, respirations 15, O2 sats 99% room air Ht _0  (1.778 m)   Wt 165 lb 5.5 oz (75 kg)   BMI 23.72 kg/m   Physical Exam awake, alert.  Chest clear to auscultation bilaterally.  Heart with slightly bradycardic but regular rhythm.  abd soft, positive bowel sounds, nontender.  No significant lower extremity edema.  Bulky right cervical/supraclavicular adenopathy  Imaging: Nm Pet Image Initial (pi) Skull Base To Thigh  Result Date: 07/22/2018 CLINICAL DATA:  Initial treatment strategy for diffuse large B-cell lymphoma. EXAM: NUCLEAR MEDICINE PET SKULL BASE TO THIGH TECHNIQUE: 8.3 mCi F-18 FDG was injected intravenously. Full-ring PET imaging was performed from the skull base to thigh after the radiotracer. CT data was obtained and used for attenuation correction and anatomic localization. Fasting blood glucose: 87 mg/dl COMPARISON:  Multiple exams, including CT neck from 06/05/2018 FINDINGS: Mediastinal blood pool  activity: SUV max 2.4 Background hepatic parenchymal activity: Maximum SUV 3.3 NECK: Notable hypermetabolic bilateral IIa, along with right IIb, III, IV, and V adenopathy observed. A right II B lymph node measuring 2.4 cm in short axis on image 28/4 has a maximum SUV of 12.3 clustered right V adenopathy individually measuring up to 1.3 cm in diameter on image 35/4 with maximum SUV 15.3. The smaller left IIa lymph node measures 0.9 cm in short axis on image 26/4 with maximum SUV 8.1. All of these are Deauville 5. There is bilateral hypermetabolic adenopathy of the palatine tonsils, maximum SUV 17.0 on the right and 9.3 on the left, Deauville 5. A small left level Ib lymph node measuring 0.6 cm in short axis on image 31/4 has a maximum SUV of 4.9, Deauville 4.  Incidental CT findings: Bilateral common carotid atherosclerotic calcification. CHEST: Scattered small hypermetabolic lymph nodes are present in the chest, including bilateral axillary nodes, a left internal mammary node, and paratracheal and subcarinal lymph nodes. An index right axillary node with parenchymal portion measuring 0.5 cm in thickness on image 50/4 has a maximum SUV of 11.3, Deauville 5. A subcarinal node measuring 0.7 cm in short axis on image 71/4 has a maximum SUV of 5.8, Deauville 4. A left internal mammary node measuring 0.4 cm in short axis on image 62/4 has a maximum SUV of 4.3, Deauville 4. There is also low-grade accentuated activity along the hila without easily measured lymph nodes. For example, right hilar activity has maximum SUV of 3.9 (Deauville 4). Incidental CT findings: Coronary, aortic arch, and branch vessel atherosclerotic vascular disease. Biapical pleuroparenchymal scarring. Subpleural lymph node measuring 4 mm along the minor fissure and subpleural lymph node along the left major fissure measuring 0.5 cm on image 35/8, no significant degree of hypermetabolic activity associated with either of the small lesions. 5 mm  subpleural nodule in the right lower lobe on image 50/8, not appreciably hypermetabolic but below sensitive PET-CT size thresholds. Similar faint subpleural nodule in the left lower lobe on image 48/8. ABDOMEN/PELVIS: No splenomegaly but diffuse hypermetabolic activity throughout the spleen, maximum SUV 11.5 (Deauville 5). The spleen measures 10.2 by 5.7 by 10.4 cm (volume = 320 cm^3). Hypermetabolic porta hepatis/peripancreatic, retroperitoneal, mesenteric, common iliac, external iliac, and inguinal adenopathy. Index portacaval node measuring 1.0 cm in short axis on image 111/4 with maximum SUV 9.3, Deauville 5. Index left upper quadrant mesenteric node measuring 1.3 cm in short axis on image 119/4, maximum SUV 7.6, Deauville 5. Index left external iliac node 0.8 cm in short axis on image 176/4, maximum SUV 10.9 comment of L5. There is accentuated metabolic activity in the appendix which measures up to 7 mm in thickness, borderline abnormally thickened. Maximum SUV in this distal portion of the appendix is 9.5, Deauville 5. Incidental CT findings: Aortoiliac atherosclerotic vascular disease. Mesenteric stranding. Empty urinary bladder with benign adipose density in the bladder wall. Sigmoid diverticulosis. SKELETON: No significant abnormal hypermetabolic activity in this region. Incidental CT findings: Lower lumbar spondylosis and degenerative disc disease. IMPRESSION: 1. Hypermetabolic adenopathy especially concentrated in the right neck but also in the left neck, chest, abdomen, and pelvis. The adenopathy is primarily Deauville 5 although some few scattered smaller lesions are Deauville 4. 2. Diffuse abnormal splenic activity, Deauville 5, without overt splenomegaly. 3. There is also hypermetabolic Deauville 5 activity in the mildly thickened distal appendix, and raising suspicion for involvement of the appendix. 4. Other imaging findings of potential clinical significance: Aortic Atherosclerosis (ICD10-I70.0).  Coronary atherosclerosis. Electronically Signed   By: Van Clines M.D.   On: 07/22/2018 15:38    Labs:  CBC: Recent Labs    11/16/17 1141 05/28/18 1420 07/08/18 0617 07/16/18 1224  WBC 7.6 10.4 7.0 8.9  HGB 14.1 13.6 13.0 13.8  HCT 42.8 40.8 40.9 43.2  PLT 144* 134* 129* 162    COAGS: No results for input(s): INR, APTT in the last 8760 hours.  BMP: Recent Labs    11/16/17 1141 05/28/18 1420 07/08/18 0617 07/16/18 1224  NA 140 140 139 138  K 4.5 3.9 3.6 3.9  CL 99 98 101 101  CO2 _0 GLUCOSE 87 87 98 69*  BUN _1 CALCIUM 9.3 9.5 9.0 9.3  CREATININE 0.83 1.03 0.89 0.82  GFRNONAA 81 66 >60 >60  GFRAA 93 77 >60 >60    LIVER FUNCTION TESTS: Recent Labs    11/16/17 1141 05/28/18 1420 07/08/18 0617 07/16/18 1224  BILITOT 0.4 0.5 0.4 0.4  AST _0 ALT _1 ALKPHOS 65 81 73 92  PROT 7.2 7.1 6.9 8.2*  ALBUMIN 4.5 4.5 3.7 4.0    TUMOR MARKERS: No results for input(s): AFPTM, CEA, CA199, CHROMGRNA in the last 8760 hours.  Assessment and Plan: 85 y.o. male with remote history of prostate cancer and now with newly diagnosed diffuse large B-cell lymphoma who presents today for Port-A-Cath placement for chemotherapy.Risks and benefits of image guided port-a-catheter placement was discussed with the patient including, but not limited to bleeding, infection, pneumothorax, or fibrin sheath development and need for additional procedures.  All of the patient's questions were answered, patient is agreeable to proceed. Consent signed and in chart.     Thank you for this interesting consult.  I greatly enjoyed meeting Mario Garcia and look forward to participating in their care.  A copy of this report was sent to the requesting provider on this date.  Electronically Signed: D. Rowe Robert, PA-C 07/30/2018, 12:13 PM   I spent a total of  25 minutes   in face to face in clinical consultation, greater than 50% of which was  counseling/coordinating care for Port-A-Cath placement

## 2018-07-30 NOTE — Procedures (Signed)
Interventional Radiology Procedure Note  Procedure: Placement of a right IJ approach single lumen PowerPort.  Tip is positioned at the superior cavoatrial junction and catheter is ready for immediate use.  Complications: No immediate Recommendations:  - Ok to shower tomorrow - Do not submerge for 7 days - Routine line care   Signed,  Cecil Bixby K. Savonna Birchmeier, MD   

## 2018-08-05 ENCOUNTER — Inpatient Hospital Stay: Payer: Medicare Other

## 2018-08-05 ENCOUNTER — Other Ambulatory Visit: Payer: Self-pay

## 2018-08-05 VITALS — BP 122/54 | HR 70 | Temp 99.1°F | Resp 16 | Ht 70.0 in | Wt 164.5 lb

## 2018-08-05 DIAGNOSIS — Z5111 Encounter for antineoplastic chemotherapy: Secondary | ICD-10-CM | POA: Diagnosis not present

## 2018-08-05 DIAGNOSIS — C8331 Diffuse large B-cell lymphoma, lymph nodes of head, face, and neck: Secondary | ICD-10-CM

## 2018-08-05 DIAGNOSIS — Z79899 Other long term (current) drug therapy: Secondary | ICD-10-CM | POA: Diagnosis not present

## 2018-08-05 LAB — CBC WITH DIFFERENTIAL/PLATELET
Abs Immature Granulocytes: 0.03 10*3/uL (ref 0.00–0.07)
Basophils Absolute: 0 10*3/uL (ref 0.0–0.1)
Basophils Relative: 0 %
Eosinophils Absolute: 0.1 10*3/uL (ref 0.0–0.5)
Eosinophils Relative: 1 %
HCT: 41.5 % (ref 39.0–52.0)
Hemoglobin: 12.8 g/dL — ABNORMAL LOW (ref 13.0–17.0)
Immature Granulocytes: 0 %
Lymphocytes Relative: 41 %
Lymphs Abs: 3.2 10*3/uL (ref 0.7–4.0)
MCH: 28.4 pg (ref 26.0–34.0)
MCHC: 30.8 g/dL (ref 30.0–36.0)
MCV: 92.2 fL (ref 80.0–100.0)
Monocytes Absolute: 0.7 10*3/uL (ref 0.1–1.0)
Monocytes Relative: 9 %
Neutro Abs: 3.7 10*3/uL (ref 1.7–7.7)
Neutrophils Relative %: 49 %
Platelets: 154 10*3/uL (ref 150–400)
RBC: 4.5 MIL/uL (ref 4.22–5.81)
RDW: 13 % (ref 11.5–15.5)
WBC: 7.7 10*3/uL (ref 4.0–10.5)
nRBC: 0 % (ref 0.0–0.2)

## 2018-08-05 LAB — CMP (CANCER CENTER ONLY)
ALT: 26 U/L (ref 0–44)
AST: 23 U/L (ref 15–41)
Albumin: 3.9 g/dL (ref 3.5–5.0)
Alkaline Phosphatase: 90 U/L (ref 38–126)
Anion gap: 10 (ref 5–15)
BUN: 17 mg/dL (ref 8–23)
CO2: 28 mmol/L (ref 22–32)
Calcium: 9 mg/dL (ref 8.9–10.3)
Chloride: 101 mmol/L (ref 98–111)
Creatinine: 0.95 mg/dL (ref 0.61–1.24)
GFR, Est AFR Am: >60 mL/min (ref >60)
GFR, Estimated: >60 mL/min (ref >60)
Glucose, Bld: 172 mg/dL — ABNORMAL HIGH (ref 70–99)
Potassium: 3.5 mmol/L (ref 3.5–5.1)
Sodium: 139 mmol/L (ref 135–145)
Total Bilirubin: 0.5 mg/dL (ref 0.3–1.2)
Total Protein: 7.6 g/dL (ref 6.5–8.1)

## 2018-08-05 LAB — LACTATE DEHYDROGENASE: LDH: 168 U/L (ref 98–192)

## 2018-08-05 MED ORDER — ACETAMINOPHEN 325 MG PO TABS
ORAL_TABLET | ORAL | Status: AC
  Filled 2018-08-05: qty 2

## 2018-08-05 MED ORDER — PALONOSETRON HCL INJECTION 0.25 MG/5ML
INTRAVENOUS | Status: AC
  Filled 2018-08-05: qty 5

## 2018-08-05 MED ORDER — PALONOSETRON HCL INJECTION 0.25 MG/5ML
0.2500 mg | Freq: Once | INTRAVENOUS | Status: AC
  Administered 2018-08-05: 09:00:00 0.25 mg via INTRAVENOUS

## 2018-08-05 MED ORDER — FAMOTIDINE IN NACL 20-0.9 MG/50ML-% IV SOLN
20.0000 mg | Freq: Once | INTRAVENOUS | Status: AC
  Administered 2018-08-05: 09:00:00 20 mg via INTRAVENOUS

## 2018-08-05 MED ORDER — VINCRISTINE SULFATE CHEMO INJECTION 1 MG/ML
1.0000 mg | Freq: Once | INTRAVENOUS | Status: AC
  Administered 2018-08-05: 11:00:00 1 mg via INTRAVENOUS
  Filled 2018-08-05: qty 1

## 2018-08-05 MED ORDER — ACETAMINOPHEN 325 MG PO TABS
650.0000 mg | ORAL_TABLET | Freq: Once | ORAL | Status: AC
  Administered 2018-08-05: 09:00:00 650 mg via ORAL

## 2018-08-05 MED ORDER — SODIUM CHLORIDE 0.9 % IV SOLN
400.0000 mg/m2 | Freq: Once | INTRAVENOUS | Status: AC
  Administered 2018-08-05: 11:00:00 780 mg via INTRAVENOUS
  Filled 2018-08-05: qty 39

## 2018-08-05 MED ORDER — HEPARIN SOD (PORK) LOCK FLUSH 100 UNIT/ML IV SOLN
500.0000 [IU] | Freq: Once | INTRAVENOUS | Status: AC | PRN
  Administered 2018-08-05: 15:00:00 500 [IU]
  Filled 2018-08-05: qty 5

## 2018-08-05 MED ORDER — SODIUM CHLORIDE 0.9% FLUSH
10.0000 mL | INTRAVENOUS | Status: DC | PRN
  Administered 2018-08-05: 15:00:00 10 mL
  Filled 2018-08-05: qty 10

## 2018-08-05 MED ORDER — DEXAMETHASONE SODIUM PHOSPHATE 10 MG/ML IJ SOLN
INTRAMUSCULAR | Status: AC
  Filled 2018-08-05: qty 1

## 2018-08-05 MED ORDER — DIPHENHYDRAMINE HCL 25 MG PO CAPS
ORAL_CAPSULE | ORAL | Status: AC
  Filled 2018-08-05: qty 1

## 2018-08-05 MED ORDER — SODIUM CHLORIDE 0.9 % IV SOLN
375.0000 mg/m2 | Freq: Once | INTRAVENOUS | Status: AC
  Administered 2018-08-05: 12:00:00 700 mg via INTRAVENOUS
  Filled 2018-08-05: qty 50

## 2018-08-05 MED ORDER — SODIUM CHLORIDE 0.9 % IV SOLN
Freq: Once | INTRAVENOUS | Status: AC
  Administered 2018-08-05: 09:00:00 via INTRAVENOUS
  Filled 2018-08-05: qty 250

## 2018-08-05 MED ORDER — FAMOTIDINE IN NACL 20-0.9 MG/50ML-% IV SOLN
INTRAVENOUS | Status: AC
  Filled 2018-08-05: qty 50

## 2018-08-05 MED ORDER — DOXORUBICIN HCL CHEMO IV INJECTION 2 MG/ML
25.0000 mg/m2 | Freq: Once | INTRAVENOUS | Status: AC
  Administered 2018-08-05: 10:00:00 48 mg via INTRAVENOUS
  Filled 2018-08-05: qty 24

## 2018-08-05 MED ORDER — DIPHENHYDRAMINE HCL 25 MG PO CAPS
25.0000 mg | ORAL_CAPSULE | Freq: Once | ORAL | Status: AC
  Administered 2018-08-05: 09:00:00 25 mg via ORAL

## 2018-08-05 MED ORDER — DEXAMETHASONE SODIUM PHOSPHATE 10 MG/ML IJ SOLN
10.0000 mg | Freq: Once | INTRAMUSCULAR | Status: AC
  Administered 2018-08-05: 10 mg via INTRAVENOUS

## 2018-08-05 NOTE — Patient Instructions (Signed)
[facility] Discharge Instructions for Patients Receiving Chemotherapy  Today you received the following chemotherapy agents:  Doxorubicin, Vincristine, Cytoxan, Rituxan  To help prevent nausea and vomiting after your treatment, we encourage you to take your nausea medication as prescribed.   If you develop nausea and vomiting that is not controlled by your nausea medication, call the clinic.   BELOW ARE SYMPTOMS THAT SHOULD BE REPORTED IMMEDIATELY:  *FEVER GREATER THAN 100.5 F  *CHILLS WITH OR WITHOUT FEVER  NAUSEA AND VOMITING THAT IS NOT CONTROLLED WITH YOUR NAUSEA MEDICATION  *UNUSUAL SHORTNESS OF BREATH  *UNUSUAL BRUISING OR BLEEDING  TENDERNESS IN MOUTH AND THROAT WITH OR WITHOUT PRESENCE OF ULCERS  *URINARY PROBLEMS  *BOWEL PROBLEMS  UNUSUAL RASH Items with * indicate a potential emergency and should be followed up as soon as possible.  Feel free to call the clinic should you have any questions or concerns. The clinic phone number is [phone].  Please show the [facility] at check-in to the Emergency Department and triage nurse.   Coronavirus (COVID-19) Are you at risk?  Are you at risk for the Coronavirus (COVID-19)?  To be considered HIGH RISK for Coronavirus (COVID-19), you have to meet the following criteria:  . Traveled to [place], [place], [place], [place] or [place]; or in the [place] to [place], [place], [place], or [place]; and have fever, cough, and shortness of breath within the last 2 weeks of travel OR . Been in close contact with a person diagnosed with COVID-19 within the last 2 weeks and have fever, cough, and shortness of breath . IF YOU DO NOT MEET THESE CRITERIA, YOU ARE CONSIDERED LOW RISK FOR COVID-19.  What to do if you are HIGH RISK for COVID-19?  Marland Kitchen If you are having a medical emergency, call 911. . Seek medical care right away. Before you go to a doctor's office, urgent care or emergency department,  call ahead and tell them about your recent travel, contact with someone diagnosed with COVID-19, and your symptoms. You should receive instructions from your physician's office regarding next steps of care.  . When you arrive at healthcare provider, tell the healthcare staff immediately you have returned from visiting [place], [place], [place], [place] or [place]; or traveled in the [place] to [place], [place], [place], or [place]; in the last two weeks or you have been in close contact with a person diagnosed with COVID-19 in the last 2 weeks.   . Tell the health care staff about your symptoms: fever, cough and shortness of breath. . After you have been seen by a medical provider, you will be either: o Tested for (COVID-19) and discharged home on quarantine except to seek medical care if symptoms worsen, and asked to  - Stay home and avoid contact with others until you get your results (4-5 days)  - Avoid travel on public transportation if possible (such as bus, train, or airplane) or o Sent to the Emergency Department by EMS for evaluation, COVID-19 testing, and possible admission depending on your condition and test results.  What to do if you are LOW RISK for COVID-19?  Reduce your risk of any infection by using the same precautions used for avoiding the common cold or flu:  Marland Kitchen Wash your hands often with soap and warm water for at least 20 seconds.  If soap and water are not readily available, use an alcohol-based hand sanitizer with at least 60% alcohol.  Marland Kitchen  If coughing or sneezing, cover your mouth and nose by coughing or sneezing into the elbow areas of your shirt or coat, into a tissue or into your sleeve (not your hands). . Avoid shaking hands with others and consider head nods or verbal greetings only. . Avoid touching your eyes, nose, or mouth with unwashed hands.  . Avoid close contact with people who are sick. . Avoid places or events with large numbers of people in one  location, like concerts or sporting events. . Carefully consider travel plans you have or are making. . If you are planning any travel outside or inside the [place], visit the CDC's Travelers' Health webpage for the latest health notices. . If you have some symptoms but not all symptoms, continue to monitor at home and seek medical attention if your symptoms worsen. . If you are having a medical emergency, call 911.   [place] [facility] / e-Visit: [email or web address]         [facility]: [facility]: [phone]                   [facility]: [phone]

## 2018-08-05 NOTE — Progress Notes (Signed)
Met pt in infusion room & discussed education packet with him & reviewed [facility]-Rituxan regimen.  Pt did not have any questions & appreciated education materials.

## 2018-08-06 ENCOUNTER — Telehealth: Payer: Self-pay | Admitting: *Deleted

## 2018-08-06 NOTE — Telephone Encounter (Signed)
-----   Message from Paulla Dolly, RN sent at 08/05/2018  3:54 PM EDT ----- Regarding: Irene Limbo 1st chemo 1st [facility].  Tol well

## 2018-08-06 NOTE — Telephone Encounter (Signed)
Contacted patient for follow up after first day. Patient states doing well. Reviewed medications with patient. Patient states good understanding of support meds. Encouraged to contact office as needed.

## 2018-08-07 ENCOUNTER — Other Ambulatory Visit: Payer: Self-pay

## 2018-08-07 ENCOUNTER — Inpatient Hospital Stay: Payer: Medicare Other

## 2018-08-07 ENCOUNTER — Telehealth: Payer: Self-pay | Admitting: *Deleted

## 2018-08-07 VITALS — BP 126/49 | HR 60 | Temp 98.1°F | Resp 18

## 2018-08-07 DIAGNOSIS — C8331 Diffuse large B-cell lymphoma, lymph nodes of head, face, and neck: Secondary | ICD-10-CM

## 2018-08-07 DIAGNOSIS — Z79899 Other long term (current) drug therapy: Secondary | ICD-10-CM | POA: Diagnosis not present

## 2018-08-07 DIAGNOSIS — Z5111 Encounter for antineoplastic chemotherapy: Secondary | ICD-10-CM | POA: Diagnosis not present

## 2018-08-07 MED ORDER — PEGFILGRASTIM-CBQV 6 MG/0.6ML ~~LOC~~ SOSY
6.0000 mg | PREFILLED_SYRINGE | Freq: Once | SUBCUTANEOUS | Status: AC
  Administered 2018-08-07: 11:00:00 6 mg via SUBCUTANEOUS

## 2018-08-07 MED ORDER — PEGFILGRASTIM-CBQV 6 MG/0.6ML ~~LOC~~ SOSY
PREFILLED_SYRINGE | SUBCUTANEOUS | Status: AC
  Filled 2018-08-07: qty 0.6

## 2018-08-07 NOTE — Telephone Encounter (Signed)
Patient called. States he only has MCR part A, no benefits for prescriptions. He called [facility] and found out Neulasta costs over $6000.00 and does not think he can afford it. Advised him that his information will be given to the patient financial advocate for the pharmacy [facility], to see if financial assistance is available. Patient verbalized understanding.

## 2018-08-07 NOTE — Patient Instructions (Signed)
Pegfilgrastim injection  What is this medicine?  PEGFILGRASTIM (PEG fil gra stim) is a long-acting granulocyte colony-stimulating factor that stimulates the growth of neutrophils, a type of white blood cell important in the body's fight against infection. It is used to reduce the incidence of fever and infection in patients with certain types of cancer who are receiving chemotherapy that affects the bone marrow, and to increase survival after being exposed to high doses of radiation.  This medicine may be used for other purposes; ask your health care provider or pharmacist if you have questions.  COMMON BRAND NAME(S): Fulphila, Neulasta, UDENYCA  What should I tell my health care provider before I take this medicine?  They need to know if you have any of these conditions:  -kidney disease  -latex allergy  -ongoing radiation therapy  -sickle cell disease  -skin reactions to acrylic adhesives (On-Body Injector only)  -an unusual or allergic reaction to pegfilgrastim, filgrastim, other medicines, foods, dyes, or preservatives  -pregnant or trying to get pregnant  -breast-feeding  How should I use this medicine?  This medicine is for injection under the skin. If you get this medicine at home, you will be taught how to prepare and give the pre-filled syringe or how to use the On-body Injector. Refer to the patient Instructions for Use for detailed instructions. Use exactly as directed. Tell your healthcare provider immediately if you suspect that the On-body Injector may not have performed as intended or if you suspect the use of the On-body Injector resulted in a missed or partial dose.  It is important that you put your used needles and syringes in a special sharps container. Do not put them in a trash can. If you do not have a sharps container, call your pharmacist or healthcare provider to get one.  Talk to your pediatrician regarding the use of this medicine in children. While this drug may be prescribed for  selected conditions, precautions do apply.  Overdosage: If you think you have taken too much of this medicine contact a poison control center or emergency room at once.  NOTE: This medicine is only for you. Do not share this medicine with others.  What if I miss a dose?  It is important not to miss your dose. Call your doctor or health care professional if you miss your dose. If you miss a dose due to an On-body Injector failure or leakage, a new dose should be administered as soon as possible using a single prefilled syringe for manual use.  What may interact with this medicine?  Interactions have not been studied.  Give your health care provider a list of all the medicines, herbs, non-prescription drugs, or dietary supplements you use. Also tell them if you smoke, drink alcohol, or use illegal drugs. Some items may interact with your medicine.  This list may not describe all possible interactions. Give your health care provider a list of all the medicines, herbs, non-prescription drugs, or dietary supplements you use. Also tell them if you smoke, drink alcohol, or use illegal drugs. Some items may interact with your medicine.  What should I watch for while using this medicine?  You may need blood work done while you are taking this medicine.  If you are going to need a MRI, CT scan, or other procedure, tell your doctor that you are using this medicine (On-Body Injector only).  What side effects may I notice from receiving this medicine?  Side effects that you should report to   your doctor or health care professional as soon as possible:  -allergic reactions like skin rash, itching or hives, swelling of the face, lips, or tongue  -back pain  -dizziness  -fever  -pain, redness, or irritation at site where injected  -pinpoint red spots on the skin  -red or dark-brown urine  -shortness of breath or breathing problems  -stomach or side pain, or pain at the shoulder  -swelling  -tiredness  -trouble passing urine or  change in the amount of urine  Side effects that usually do not require medical attention (report to your doctor or health care professional if they continue or are bothersome):  -bone pain  -muscle pain  This list may not describe all possible side effects. Call your doctor for medical advice about side effects. You may report side effects to FDA at 1-800-FDA-1088.  Where should I keep my medicine?  Keep out of the reach of children.  If you are using this medicine at home, you will be instructed on how to store it. Throw away any unused medicine after the expiration date on the label.  NOTE: This sheet is a summary. It may not cover all possible information. If you have questions about this medicine, talk to your doctor, pharmacist, or health care provider.   2019 [employer] Standard (2017-06-18 16:57:08)

## 2018-08-08 NOTE — Progress Notes (Signed)
Rapid Infusion Rituximab Pharmacist Evaluation  Patients may be eligible for Rapid Infusion Rituximab (RIR) if they have no significant cardiac disease, no risk for Tumor Lysis Syndrome (TLS), received rituximab within the last 6 months, and tolerated those infusions per standard protocol without grade 3-4 infusion reactions. A pharmacist has verified the patient tolerated rituximab infusions per the [facility] standard infusion protocol without grade 3-4 infusion reactions. The treatment plan will be updated to reflect RIR if the patient qualifies per the checklist below.   Mario Garcia is a 85 y.o. male being treated with rituximab for DLBL. This patient may be considered for RIR.    Age > 26 years old Yes   Stable renal, hepatic, and hematologic function Yes   Recent Pertinent Lab Values  Lab Results  Component Value Date   CREATININE 0.95 08/05/2018   BILITOT 0.5 08/05/2018   Lab Results  Component Value Date   WBC 7.7 08/05/2018   LYMPHSABS 3.2 08/05/2018   PLT 154 08/05/2018     Prior documented reaction to rituximab No   Previous rituximab infusion within 6 months No   Physician approval of RIR Yes   Treatment Plan updated orders to reflect RIR Yes    Kennith Center, Pharm.D., CPP 08/08/2018@2 :01 PM

## 2018-08-09 ENCOUNTER — Other Ambulatory Visit: Payer: Self-pay | Admitting: *Deleted

## 2018-08-09 DIAGNOSIS — C8331 Diffuse large B-cell lymphoma, lymph nodes of head, face, and neck: Secondary | ICD-10-CM

## 2018-08-09 NOTE — Progress Notes (Signed)
HEMATOLOGY/ONCOLOGY CLINIC NOTE  Date of Service: 08/12/2018  Patient Care Team: Denita Lung, MD as PCP - General (Family Medicine)  CHIEF COMPLAINTS/PURPOSE OF CONSULTATION:  Diffuse Large B-Cell Lymphoma  HISTORY OF PRESENTING ILLNESS:   Mario Garcia is a wonderful 85 y.o. male who has been referred to [place] by Dr. Jill Alexanders for evaluation and management of Diffuse Large B-Cell Lymphoma. The pt reports that he is doing well overall.   The pt reports that he feels that he has been a little more tired recently. He first noticed some "nodules" on his neck appear "4-6 weeks ago." He notes that these nodules haven't been painful. He appears to have presented to care with his PCP on 05/28/18, Dr. Redmond School. He notes that he has noticed that he has been sweating more in the last year. He denies noticing any other new lumps or bumps. He denies any fevers, chills, drenching night sweats, or unexpected weight loss. He notes that he has had some changes in swallowing over the last 6 months, which he characterizes as "getting strangled on his saliva every now and then." He denies abdominal pains, acid reflux, changes in bowel habits, leg swelling, skin rashes. He endorses a deep pain "every once in a while," in his left groin. He denies headaches. He endorses glaucoma and a history of cataracts. He sees Dr. Hetty Blend in ophthalmology. He denies any other concern in the last 6 months.  The pt notes that he lives independently with his wife and still is able to do all he wants to do. He walks on trails with his wife and notes that he can generally walk as far as he likes to.  The pt notes that he had prostate cancer in 2008 or 2009, s/p prostatectomy. He did not require RT nor systemic treatments. He denies lung, heart or kidney problems.  Of note prior to the patient's visit today, pt has had a CT Neck completed on 06/05/18 with results revealing Pathologic RIGHT-sided level II, III, IV, and V  lymphadenopathy, most consistent with metastatic carcinoma. An obvious primary source is not identified. Tissue sampling is warranted.  Most recent lab results (07/08/18) of CBC and CMP is as follows: all values are WNL except for PLT at 129k.  On review of systems, pt reports slightly more tired, neck nodules, some changes in swallowing, staying active, and denies fevers, chills, drenching night sweats, unexpected weight loss, abdominal pains, changes in bowel habits, skin rashes, leg swelling, CP, SOB, difficulty breathing, headaches, other lumps or bumps, skin lesions, mouth sores, pain along the spine, back pain, leg swelling, and any other symptoms.   On PMHx the pt reports localized Prostate cancer in 2008 s/p prostatectomy.  On Social Hx the pt reports that he quit smoking over 50 years ago. He notes that he consumes about 1 glass of wine every day, without concerns for excessive drinking. 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 He denies concern for chemical or radiation exposure. On Family Hx the pt reports wife with Stage IV Non Hodgkin's Lymphoma, paternal grandmother with leukemia in her 23s, father with unspecified abdominal cancer.  Interval History:   Mario Garcia returns today for management and evaluation of his newly diagnosed Diffuse Large B-Cell Lymphoma. The patient's last visit with [place] was on 07/26/18. The pt reports that he is doing well overall.  The pt reports that he has not developed any new concerns in the  interim. He notes that he tolerated the first cycle very well and denies any nausea. He notes that he has remained active and had minimal, mild bilateral hip discomfort after his Neulasta shot. He notes this lasted a couple hours and went away on its own.   The pt notes that his right cervical lymph node has decreased in size. He denies any mouth sores, leg swelling, or changes in breathing.  Lab results today (08/12/18) of CBC w/diff and  CMP is as follows: all values are WNL except for WBC at 18.5k, HGB at 12.5, PLT at 119k Glucose at 116, Calcium at 8.6, Alk Phos at 143.  On review of systems, pt reports good energy levels, staying active, reduced right cervical lymph node, good appetite, and denies nausea, changes in bowel habits, mouth sores, leg swelling, changes in breathing, fevers, chills, concerns for infections, abdominal pains, skin rashes, and any other symptoms.  MEDICAL HISTORY:  Past Medical History:  Diagnosis Date  . Cancer ([place])    PROSTATE  . History of colonic polyps   . History of kidney stones   . Hypertension   . Inguinal hernia 03/2002  . Pneumonia    "years ago"    SURGICAL HISTORY: Past Surgical History:  Procedure Laterality Date  . COLONOSCOPY    . DIRECT LARYNGOSCOPY N/A 07/08/2018   Procedure: DIRECT LARYNGOSCOPY;  Surgeon: Leta Baptist, MD;  Location: [facility];  Service: ENT;  Laterality: N/A;  . ESOPHAGOSCOPY N/A 07/08/2018   Procedure: ESOPHAGOSCOPY;  Surgeon: Leta Baptist, MD;  Location: [facility];  Service: ENT;  Laterality: N/A;  . FLEXIBLE BRONCHOSCOPY N/A 07/08/2018   Procedure: FLEXIBLE BRONCHOSCOPY;  Surgeon: Leta Baptist, MD;  Location: [facility];  Service: ENT;  Laterality: N/A;  . HERNIA REPAIR Left    inguinal hernia  . IR IMAGING GUIDED PORT INSERTION  07/30/2018  . MASS BIOPSY Right 07/08/2018   Procedure: RIGHT NECK MASS EXCISIONAL BIOPSY;  Surgeon: Leta Baptist, MD;  Location: [facility];  Service: ENT;  Laterality: Right;  . PATELLA FRACTURE SURGERY Left   . PROSTATE SURGERY  10/2005   RADIAL PROSTATECTOMY  . ROTATOR CUFF REPAIR Bilateral     SOCIAL HISTORY: Social History   Socioeconomic History  . Marital status: Married    Spouse name: Not on file  . Number of children: Not on file  . Years of education: Not on file  . Highest education level: Not on file  Occupational History  . Not on file  Social Needs  . Financial resource strain: Not on file  . Food insecurity:    Worry: Not on  file    Inability: Not on file  . Transportation needs:    Medical: Not on file    Non-medical: Not on file  Tobacco Use  . Smoking status: Former Research scientist (life sciences)  . Smokeless tobacco: Never Used  . Tobacco comment: stopped 60 years ago  Substance and Sexual Activity  . Alcohol use: Yes    Alcohol/week: 6.0 standard drinks    Types: 6 Standard drinks or equivalent per week  . Drug use: No  . Sexual activity: Not Currently  Lifestyle  . Physical activity:    Days per week: Not on file    Minutes per session: Not on file  . Stress: Not on file  Relationships  . Social connections:    Talks on phone: Not on file    Gets together: Not on file    Attends religious service: Not on file  Active member of club or organization: Not on file    Attends meetings of clubs or organizations: Not on file    Relationship status: Not on file  . Intimate partner violence:    Fear of current or ex partner: Not on file    Emotionally abused: Not on file    Physically abused: Not on file    Forced sexual activity: Not on file  Other Topics Concern  . Not on file  Social History Narrative  . Not on file    FAMILY HISTORY: Family History  Problem Relation Age of Onset  . Cancer Father   . Cancer Brother     ALLERGIES:  is allergic to lisinopril and penicillins.  MEDICATIONS:  Current Outpatient Medications  Medication Sig Dispense Refill  . allopurinol (ZYLOPRIM) 300 MG tablet Take 0.5 tablets (150 mg total) by mouth daily. 30 tablet 0  . Carboxymethylcellulose Sodium (ARTIFICIAL TEARS OP) Place 1 drop into both eyes daily as needed (dry eyes).    . dorzolamide (TRUSOPT) 2 % ophthalmic solution Place 1 drop into both eyes 2 (two) times daily.    Marland Kitchen latanoprost (XALATAN) 0.005 % ophthalmic solution Place 1 drop into both eyes at bedtime.    . lidocaine-prilocaine (EMLA) cream Apply to affected area once 30 g 3  . losartan-hydrochlorothiazide (HYZAAR) 50-12.5 MG tablet TAKE 1 TABLET BY MOUTH  EVERY DAY (Patient taking differently: Take 1 tablet by mouth daily with lunch. ) 90 tablet 0  . ondansetron (ZOFRAN) 8 MG tablet Take 1 tablet (8 mg total) by mouth 2 (two) times daily as needed for refractory nausea / vomiting. Start on day 3 after cyclophosphamide chemotherapy. 30 tablet 1  . oxyCODONE-acetaminophen (PERCOCET) 5-325 MG tablet Take 1 tablet by mouth every 4 (four) hours as needed for severe pain. 20 tablet 0  . pravastatin (PRAVACHOL) 40 MG tablet TAKE 1 TABLET BY MOUTH ONCE DAILY (Patient taking differently: Take 40 mg by mouth daily with lunch. ) 90 tablet 2  . predniSONE (DELTASONE) 20 MG tablet Take 3 tablets (60 mg total) by mouth daily. Take on days 1-5 of chemotherapy. 15 tablet 6  . prochlorperazine (COMPAZINE) 10 MG tablet Take 1 tablet (10 mg total) by mouth every 6 (six) hours as needed (Nausea or vomiting). 30 tablet 6   No current facility-administered medications for this visit.     REVIEW OF SYSTEMS:    A 10+ POINT REVIEW OF SYSTEMS WAS OBTAINED including neurology, dermatology, psychiatry, cardiac, respiratory, lymph, extremities, GI, GU, Musculoskeletal, constitutional, breasts, reproductive, HEENT.  All pertinent positives are noted in the HPI.  All others are negative.   PHYSICAL EXAMINATION: ECOG PERFORMANCE STATUS: 1 - Symptomatic but completely ambulatory  Vitals:   08/12/18 1345  BP: (!) 146/55  Pulse: 64  Resp: 18  Temp: 98.3 F (36.8 C)  SpO2: 96%   Filed Weights   08/12/18 1345  Weight: 164 lb 12.8 oz (74.8 kg)   .Body mass index is 23.65 kg/m.  GENERAL:alert, in no acute distress and comfortable SKIN: no acute rashes, no significant lesions EYES: conjunctiva are pink and non-injected, sclera anicteric OROPHARYNX: MMM, no exudates, no oropharyngeal erythema or ulceration NECK: supple, no JVD LYMPH: Improved palpable right cervical lymph nodes. Palpable right supraclavicular Level IV and Level V lymph nodes. No palpable lymphadenopathy  in the axillary or inguinal regions LUNGS: clear to auscultation b/l with normal respiratory effort HEART: regular rate & rhythm ABDOMEN:  normoactive bowel sounds , non tender, not distended.  No palpable hepatosplenomegaly.  Extremity: no pedal edema PSYCH: alert & oriented x 3 with fluent speech NEURO: no focal motor/sensory deficits   LABORATORY DATA:  I have reviewed the data as listed  . CBC Latest Ref Rng & Units 08/12/2018 08/05/2018 07/30/2018  WBC 4.0 - 10.5 K/uL 18.5(H) 7.7 8.1  Hemoglobin 13.0 - 17.0 g/dL 12.5(L) 12.8(L) 13.9  Hematocrit 39.0 - 52.0 % 39.5 41.5 43.9  Platelets 150 - 400 K/uL 119(L) 154 144(L)    . CMP Latest Ref Rng & Units 08/12/2018 08/05/2018 07/16/2018  Glucose 70 - 99 mg/dL 116(H) 172(H) 69(L)  BUN 8 - 23 mg/dL _0 Creatinine 0.61 - 1.24 mg/dL 0.91 0.95 0.82  Sodium 135 - 145 mmol/L 138 139 138  Potassium 3.5 - 5.1 mmol/L 3.6 3.5 3.9  Chloride 98 - 111 mmol/L 101 101 101  CO2 22 - 32 mmol/L _1 Calcium 8.9 - 10.3 mg/dL 8.6(L) 9.0 9.3  Total Protein 6.5 - 8.1 g/dL 6.8 7.6 8.2(H)  Total Bilirubin 0.3 - 1.2 mg/dL 0.3 0.5 0.4  Alkaline Phos 38 - 126 U/L 143(H) 90 92  AST 15 - 41 U/L _2 ALT 0 - 44 U/L _3 . Lab Results  Component Value Date   LDH 168 08/05/2018    07/08/18 Right Cervical Tissue Biopsy:    RADIOGRAPHIC STUDIES: I have personally reviewed the radiological images as listed and agreed with the findings in the report. Nm Pet Image Initial (pi) Skull Base To Thigh  Result Date: 07/22/2018 CLINICAL DATA:  Initial treatment strategy for diffuse large B-cell lymphoma. EXAM: NUCLEAR MEDICINE PET SKULL BASE TO THIGH TECHNIQUE: 8.3 mCi F-18 FDG was injected intravenously. Full-ring PET imaging was performed from the skull base to thigh after the radiotracer. CT data was obtained and used for attenuation correction and anatomic localization. Fasting blood glucose: 87 mg/dl COMPARISON:  Multiple exams, including CT neck  from 06/05/2018 FINDINGS: Mediastinal blood pool activity: SUV max 2.4 Background hepatic parenchymal activity: Maximum SUV 3.3 NECK: Notable hypermetabolic bilateral IIa, along with right IIb, III, IV, and V adenopathy observed. A right II B lymph node measuring 2.4 cm in short axis on image 28/4 has a maximum SUV of 12.3 clustered right V adenopathy individually measuring up to 1.3 cm in diameter on image 35/4 with maximum SUV 15.3. The smaller left IIa lymph node measures 0.9 cm in short axis on image 26/4 with maximum SUV 8.1. All of these are Deauville 5. There is bilateral hypermetabolic adenopathy of the palatine tonsils, maximum SUV 17.0 on the right and 9.3 on the left, Deauville 5. A small left level Ib lymph node measuring 0.6 cm in short axis on image 31/4 has a maximum SUV of 4.9, Deauville 4. Incidental CT findings: Bilateral common carotid atherosclerotic calcification. CHEST: Scattered small hypermetabolic lymph nodes are present in the chest, including bilateral axillary nodes, a left internal mammary node, and paratracheal and subcarinal lymph nodes. An index right axillary node with parenchymal portion measuring 0.5 cm in thickness on image 50/4 has a maximum SUV of 11.3, Deauville 5. A subcarinal node measuring 0.7 cm in short axis on image 71/4 has a maximum SUV of 5.8, Deauville 4. A left internal mammary node measuring 0.4 cm in short axis on image 62/4 has a maximum SUV of 4.3, Deauville 4. There is also low-grade accentuated activity along the hila without easily measured lymph nodes. For example, right hilar activity has  maximum SUV of 3.9 (Deauville 4). Incidental CT findings: Coronary, aortic arch, and branch vessel atherosclerotic vascular disease. Biapical pleuroparenchymal scarring. Subpleural lymph node measuring 4 mm along the minor fissure and subpleural lymph node along the left major fissure measuring 0.5 cm on image 35/8, no significant degree of hypermetabolic activity  associated with either of the small lesions. 5 mm subpleural nodule in the right lower lobe on image 50/8, not appreciably hypermetabolic but below sensitive PET-CT size thresholds. Similar faint subpleural nodule in the left lower lobe on image 48/8. ABDOMEN/PELVIS: No splenomegaly but diffuse hypermetabolic activity throughout the spleen, maximum SUV 11.5 (Deauville 5). The spleen measures 10.2 by 5.7 by 10.4 cm (volume = 320 cm^3). Hypermetabolic porta hepatis/peripancreatic, retroperitoneal, mesenteric, common iliac, external iliac, and inguinal adenopathy. Index portacaval node measuring 1.0 cm in short axis on image 111/4 with maximum SUV 9.3, Deauville 5. Index left upper quadrant mesenteric node measuring 1.3 cm in short axis on image 119/4, maximum SUV 7.6, Deauville 5. Index left external iliac node 0.8 cm in short axis on image 176/4, maximum SUV 10.9 comment of L5. There is accentuated metabolic activity in the appendix which measures up to 7 mm in thickness, borderline abnormally thickened. Maximum SUV in this distal portion of the appendix is 9.5, Deauville 5. Incidental CT findings: Aortoiliac atherosclerotic vascular disease. Mesenteric stranding. Empty urinary bladder with benign adipose density in the bladder wall. Sigmoid diverticulosis. SKELETON: No significant abnormal hypermetabolic activity in this region. Incidental CT findings: Lower lumbar spondylosis and degenerative disc disease. IMPRESSION: 1. Hypermetabolic adenopathy especially concentrated in the right neck but also in the left neck, chest, abdomen, and pelvis. The adenopathy is primarily Deauville 5 although some few scattered smaller lesions are Deauville 4. 2. Diffuse abnormal splenic activity, Deauville 5, without overt splenomegaly. 3. There is also hypermetabolic Deauville 5 activity in the mildly thickened distal appendix, and raising suspicion for involvement of the appendix. 4. Other imaging findings of potential clinical  significance: Aortic Atherosclerosis (ICD10-I70.0). Coronary atherosclerosis. Electronically Signed   By: Van Clines M.D.   On: 07/22/2018 15:38   Ir Imaging Guided Port Insertion  Result Date: 07/30/2018 INDICATION: 85 year old male with a new diagnosis of diffuse large B-cell lymphoma. He needs urgent chemotherapy and presents for port catheter placement. Due to bulky right supraclavicular and cervical chain adenopathy, his port catheter will be placed via a left internal jugular venous approach. EXAM: IMPLANTED PORT A CATH PLACEMENT WITH ULTRASOUND AND FLUOROSCOPIC GUIDANCE MEDICATIONS: 900 mg Cleocin; The antibiotic was administered within an appropriate time interval prior to skin puncture. ANESTHESIA/SEDATION: Versed 2 mg IV; Fentanyl 100 mcg IV; Moderate Sedation Time:  28 minutes The patient was continuously monitored during the procedure by the interventional radiology nurse under my direct supervision. FLUOROSCOPY TIME:  0 minutes, 48 seconds (5 mGy) COMPLICATIONS: None immediate. PROCEDURE: The left neck and chest was prepped with chlorhexidine, and draped in the usual sterile fashion using maximum barrier technique (cap and mask, sterile gown, sterile gloves, large sterile sheet, hand hygiene and cutaneous antiseptic). Local anesthesia was attained by infiltration with 1% lidocaine with epinephrine. Ultrasound demonstrated patency of the left internal jugular vein, and this was documented with an image. Under real-time ultrasound guidance, this vein was accessed with a 21 gauge micropuncture needle and image documentation was performed. A small dermatotomy was made at the access site with an 11 scalpel. A 0.018" wire was advanced into the SVC and the access needle exchanged for a 52F micropuncture vascular sheath. The  0.018" wire was then removed and a 0.035" wire advanced into the IVC. An appropriate location for the subcutaneous reservoir was selected below the clavicle and an incision was  made through the skin and underlying soft tissues. The subcutaneous tissues were then dissected using a combination of blunt and sharp surgical technique and a pocket was formed. A single lumen power injectable portacatheter was then tunneled through the subcutaneous tissues from the pocket to the dermatotomy and the port reservoir placed within the subcutaneous pocket. The venous access site was then serially dilated and a peel away vascular sheath placed over the wire. The wire was removed and the port catheter advanced into position under fluoroscopic guidance. The catheter tip is positioned in the superior cavoatrial junction. This was documented with a spot image. The portacatheter was then tested and found to flush and aspirate well. The port was flushed with saline followed by 100 units/mL heparinized saline. The pocket was then closed in two layers using first subdermal inverted interrupted absorbable sutures followed by a running subcuticular suture. The epidermis was then sealed with Dermabond. The dermatotomy at the venous access site was also closed with Dermabond. IMPRESSION: Successful placement of a left IJ approach Power Port with ultrasound and fluoroscopic guidance. The catheter is ready for use. Electronically Signed   By: Jacqulynn Cadet M.D.   On: 07/30/2018 15:49    ASSESSMENT & PLAN:   85 y.o. male with  1. Diffuse Large B-Cell Lymphoma, Stage IV Presenting without constitutional symptoms. Palpable right cervical and supraclavicular lymphadenopathy. 07/08/18 Right cervical soft tissue biopsy revealed Diffuse Large B-Cell Lymphoma, germinal center type  06/05/18 CT Neck revealed Pathologic RIGHT-sided level II, III, IV, and V lymphadenopathy, most consistent with metastatic carcinoma. An obvious primary source is not identified. Tissue sampling is warranted. 07/16/18 Hep B and Hep C negative  07/17/18 ECHO revealed LV EF of 60-65%  07/22/18 PET/CT revealed "Hypermetabolic  adenopathy especially concentrated in the right neck but also in the left neck, chest, abdomen, and pelvis. The adenopathy is primarily Deauville 5 although some few scattered smaller lesions are Deauville 4. 2. Diffuse abnormal splenic activity, Deauville 5, without overt splenomegaly. 3. There is also hypermetabolic Deauville 5 activity in the mildly thickened distal appendix, and raising suspicion for involvement of the appendix. 4. Other imaging findings of potential clinical significance: Aortic Atherosclerosis. Coronary atherosclerosis."  PLAN: -Discussed pt labwork today, 08/12/18; WBC at 18.5k in setting of G-CSF support. PLT holding at 119k and HGB stable at 12.5 -The pt has no prohibitive toxicities from C1 R-mini-CHOP with G-CSF suppport at this time. -Recommend Tylenol and Claritin for mild Neulasta related bone pains -Pt will be able to receive the rapid protocol of Rituxan with C2 onwards -Discussed the balance between using the most effective dose with treatment tolerance. Will consider mildly increasing dose as the pt has tolerated C1 very well -Recommend R-mini-CHOP up to 6 cycles with G-CSF support and possibly consolidative RT. -Discussed the characteristics of DLBCL which increase risk of brain recurrence. Pt is not high risk category, but is intermediate risk given bowel involvement. At pt's age, I do not recommend prophylactic IT MTX with concerns for tolerance and potentially causing delays in primary induction treatment. -Advised infection prevention strategies, crowd avoidance, and frequent hand washing -Recommend Miralax, and Senna S at night time -Will see the pt back in 2 weeks with C2D1   Please schedule C2 and C3 of chemotherapy as per orders with labs and MD visit  All of the patients questions were answered with apparent satisfaction. The patient knows to call the clinic with any problems, questions or concerns.  The total time spent in the appt was 20 minutes  and more than 50% was on counseling and direct patient cares.    Sullivan Lone MD MS AAHIVMS [facility] Hematology/Oncology Physician [facility]  (Office):       [phone] (Work cell):  [phone] (Fax):           [phone]  08/12/2018 2:09 PM  I, Baldwin Jamaica, am acting as a scribe for Dr. Sullivan Lone.   .I have reviewed the above documentation for accuracy and completeness, and I agree with the above. Brunetta Genera MD

## 2018-08-12 ENCOUNTER — Telehealth: Payer: Self-pay | Admitting: Hematology

## 2018-08-12 ENCOUNTER — Inpatient Hospital Stay (HOSPITAL_BASED_OUTPATIENT_CLINIC_OR_DEPARTMENT_OTHER): Payer: Medicare Other | Admitting: Hematology

## 2018-08-12 ENCOUNTER — Other Ambulatory Visit: Payer: Self-pay

## 2018-08-12 ENCOUNTER — Inpatient Hospital Stay: Payer: Medicare Other

## 2018-08-12 VITALS — BP 146/55 | HR 64 | Temp 98.3°F | Resp 18 | Ht 70.0 in | Wt 164.8 lb

## 2018-08-12 DIAGNOSIS — Z79899 Other long term (current) drug therapy: Secondary | ICD-10-CM

## 2018-08-12 DIAGNOSIS — C8331 Diffuse large B-cell lymphoma, lymph nodes of head, face, and neck: Secondary | ICD-10-CM

## 2018-08-12 DIAGNOSIS — Z5111 Encounter for antineoplastic chemotherapy: Secondary | ICD-10-CM | POA: Diagnosis not present

## 2018-08-12 LAB — CMP (CANCER CENTER ONLY)
ALT: 17 U/L (ref 0–44)
AST: 18 U/L (ref 15–41)
Albumin: 3.7 g/dL (ref 3.5–5.0)
Alkaline Phosphatase: 143 U/L — ABNORMAL HIGH (ref 38–126)
Anion gap: 8 (ref 5–15)
BUN: 17 mg/dL (ref 8–23)
CO2: 29 mmol/L (ref 22–32)
Calcium: 8.6 mg/dL — ABNORMAL LOW (ref 8.9–10.3)
Chloride: 101 mmol/L (ref 98–111)
Creatinine: 0.91 mg/dL (ref 0.61–1.24)
GFR, Est AFR Am: >60 mL/min (ref >60)
GFR, Estimated: >60 mL/min (ref >60)
Glucose, Bld: 116 mg/dL — ABNORMAL HIGH (ref 70–99)
Potassium: 3.6 mmol/L (ref 3.5–5.1)
Sodium: 138 mmol/L (ref 135–145)
Total Bilirubin: 0.3 mg/dL (ref 0.3–1.2)
Total Protein: 6.8 g/dL (ref 6.5–8.1)

## 2018-08-12 LAB — CBC WITH DIFFERENTIAL (CANCER CENTER ONLY)
Abs Immature Granulocytes: 1.88 10*3/uL — ABNORMAL HIGH (ref 0.00–0.07)
Basophils Absolute: 0.1 10*3/uL (ref 0.0–0.1)
Basophils Relative: 1 %
Eosinophils Absolute: 0.1 10*3/uL (ref 0.0–0.5)
Eosinophils Relative: 1 %
HCT: 39.5 % (ref 39.0–52.0)
Hemoglobin: 12.5 g/dL — ABNORMAL LOW (ref 13.0–17.0)
Immature Granulocytes: 10 %
Lymphocytes Relative: 13 %
Lymphs Abs: 2.4 10*3/uL (ref 0.7–4.0)
MCH: 28.5 pg (ref 26.0–34.0)
MCHC: 31.6 g/dL (ref 30.0–36.0)
MCV: 90 fL (ref 80.0–100.0)
Monocytes Absolute: 1.8 10*3/uL — ABNORMAL HIGH (ref 0.1–1.0)
Monocytes Relative: 10 %
Neutro Abs: 12.3 10*3/uL — ABNORMAL HIGH (ref 1.7–7.7)
Neutrophils Relative %: 65 %
Platelet Count: 119 10*3/uL — ABNORMAL LOW (ref 150–400)
RBC: 4.39 MIL/uL (ref 4.22–5.81)
RDW: 13 % (ref 11.5–15.5)
WBC Count: 18.5 10*3/uL — ABNORMAL HIGH (ref 4.0–10.5)
nRBC: 0 % (ref 0.0–0.2)

## 2018-08-12 NOTE — Telephone Encounter (Signed)
Scheduled appt per 5/18 los.  Patient aware of appt date and time.  Added treatment to the book for approval.(6/1)

## 2018-08-23 NOTE — Progress Notes (Signed)
HEMATOLOGY/ONCOLOGY CLINIC NOTE  Date of Service: 08/26/2018  Patient Care Team: Denita Lung, MD as PCP - General (Family Medicine)  CHIEF COMPLAINTS/PURPOSE OF CONSULTATION:  Diffuse Large B-Cell Lymphoma  HISTORY OF PRESENTING ILLNESS:   Mario Garcia is a wonderful 85 y.o. male who has been referred to [place] by Dr. Jill Alexanders for evaluation and management of Diffuse Large B-Cell Lymphoma. The pt reports that he is doing well overall.   The pt reports that he feels that he has been a little more tired recently. He first noticed some "nodules" on his neck appear "4-6 weeks ago." He notes that these nodules haven't been painful. He appears to have presented to care with his PCP on 05/28/18, Dr. Redmond School. He notes that he has noticed that he has been sweating more in the last year. He denies noticing any other new lumps or bumps. He denies any fevers, chills, drenching night sweats, or unexpected weight loss. He notes that he has had some changes in swallowing over the last 6 months, which he characterizes as "getting strangled on his saliva every now and then." He denies abdominal pains, acid reflux, changes in bowel habits, leg swelling, skin rashes. He endorses a deep pain "every once in a while," in his left groin. He denies headaches. He endorses glaucoma and a history of cataracts. He sees Dr. Hetty Blend in ophthalmology. He denies any other concern in the last 6 months.  The pt notes that he lives independently with his wife and still is able to do all he wants to do. He walks on trails with his wife and notes that he can generally walk as far as he likes to.  The pt notes that he had prostate cancer in 2008 or 2009, s/p prostatectomy. He did not require RT nor systemic treatments. He denies lung, heart or kidney problems.  Of note prior to the patient's visit today, pt has had a CT Neck completed on 06/05/18 with results revealing Pathologic RIGHT-sided level II, III, IV, and V  lymphadenopathy, most consistent with metastatic carcinoma. An obvious primary source is not identified. Tissue sampling is warranted.  Most recent lab results (07/08/18) of CBC and CMP is as follows: all values are WNL except for PLT at 129k.  On review of systems, pt reports slightly more tired, neck nodules, some changes in swallowing, staying active, and denies fevers, chills, drenching night sweats, unexpected weight loss, abdominal pains, changes in bowel habits, skin rashes, leg swelling, CP, SOB, difficulty breathing, headaches, other lumps or bumps, skin lesions, mouth sores, pain along the spine, back pain, leg swelling, and any other symptoms.   On PMHx the pt reports localized Prostate cancer in 2008 s/p prostatectomy.  On Social Hx the pt reports that he quit smoking over 50 years ago. He notes that he consumes about 1 glass of wine every day, without concerns for excessive drinking. 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 He denies concern for chemical or radiation exposure. On Family Hx the pt reports wife with Stage IV Non Hodgkin's Lymphoma, paternal grandmother with leukemia in her 66s, father with unspecified abdominal cancer.  Interval History:   [name] returns today for management and evaluation of his newly diagnosed Diffuse Large B-Cell Lymphoma. The patient's last visit with [place] was on 08/12/18. The pt reports that he is doing well overall.  The pt reports that he has not developed any new concerns in the  interim. He notes that he has enjoyed stable energy levels and endorses eating well. He notes that his neck lymph nodes have continued to decrease in size. He denies abdominal pains, nausea, or pain at the site of the port.  Lab results today (08/26/18) of CBC w/diff and CMP is as follows: all values are WNL except for HGB at 12.4, PLT at 140k, Monocytes abs at 1.1k, Abs immature granulocytes at 0.08k, Glucose at 103, Calcium at 8.6. 08/26/18  Magnesium at 1.8  On review of systems, pt reports eating well, stable weight, stable energy levels, decreased size of neck lymph nodes, and denies new lumps or bumps, abdominal pains, leg swelling, and any other symptoms.   MEDICAL HISTORY:  Past Medical History:  Diagnosis Date  . Cancer ([place])    PROSTATE  . History of colonic polyps   . History of kidney stones   . Hypertension   . Inguinal hernia 03/2002  . Pneumonia    "years ago"    SURGICAL HISTORY: Past Surgical History:  Procedure Laterality Date  . COLONOSCOPY    . DIRECT LARYNGOSCOPY N/A 07/08/2018   Procedure: DIRECT LARYNGOSCOPY;  Surgeon: Leta Baptist, MD;  Location: [facility];  Service: ENT;  Laterality: N/A;  . ESOPHAGOSCOPY N/A 07/08/2018   Procedure: ESOPHAGOSCOPY;  Surgeon: Leta Baptist, MD;  Location: [facility];  Service: ENT;  Laterality: N/A;  . FLEXIBLE BRONCHOSCOPY N/A 07/08/2018   Procedure: FLEXIBLE BRONCHOSCOPY;  Surgeon: Leta Baptist, MD;  Location: [facility];  Service: ENT;  Laterality: N/A;  . HERNIA REPAIR Left    inguinal hernia  . IR IMAGING GUIDED PORT INSERTION  07/30/2018  . MASS BIOPSY Right 07/08/2018   Procedure: RIGHT NECK MASS EXCISIONAL BIOPSY;  Surgeon: Leta Baptist, MD;  Location: [facility];  Service: ENT;  Laterality: Right;  . PATELLA FRACTURE SURGERY Left   . PROSTATE SURGERY  10/2005   RADIAL PROSTATECTOMY  . ROTATOR CUFF REPAIR Bilateral     SOCIAL HISTORY: Social History   Socioeconomic History  . Marital status: Married    Spouse name: Not on file  . Number of children: Not on file  . Years of education: Not on file  . Highest education level: Not on file  Occupational History  . Not on file  Social Needs  . Financial resource strain: Not on file  . Food insecurity:    Worry: Not on file    Inability: Not on file  . Transportation needs:    Medical: Not on file    Non-medical: Not on file  Tobacco Use  . Smoking status: Former Research scientist (life sciences)  . Smokeless tobacco: Never Used  . Tobacco comment: stopped  60 years ago  Substance and Sexual Activity  . Alcohol use: Yes    Alcohol/week: 6.0 standard drinks    Types: 6 Standard drinks or equivalent per week  . Drug use: No  . Sexual activity: Not Currently  Lifestyle  . Physical activity:    Days per week: Not on file    Minutes per session: Not on file  . Stress: Not on file  Relationships  . Social connections:    Talks on phone: Not on file    Gets together: Not on file    Attends religious service: Not on file    Active member of club or organization: Not on file    Attends meetings of clubs or organizations: Not on file    Relationship status: Not on file  . Intimate partner violence:  Fear of current or ex partner: Not on file    Emotionally abused: Not on file    Physically abused: Not on file    Forced sexual activity: Not on file  Other Topics Concern  . Not on file  Social History Narrative  . Not on file    FAMILY HISTORY: Family History  Problem Relation Age of Onset  . Cancer Father   . Cancer Brother     ALLERGIES:  is allergic to lisinopril and penicillins.  MEDICATIONS:  Current Outpatient Medications  Medication Sig Dispense Refill  . allopurinol (ZYLOPRIM) 300 MG tablet Take 0.5 tablets (150 mg total) by mouth daily. 30 tablet 0  . Carboxymethylcellulose Sodium (ARTIFICIAL TEARS OP) Place 1 drop into both eyes daily as needed (dry eyes).    . dorzolamide (TRUSOPT) 2 % ophthalmic solution Place 1 drop into both eyes 2 (two) times daily.    Marland Kitchen latanoprost (XALATAN) 0.005 % ophthalmic solution Place 1 drop into both eyes at bedtime.    . lidocaine-prilocaine (EMLA) cream Apply to affected area once 30 g 3  . losartan-hydrochlorothiazide (HYZAAR) 50-12.5 MG tablet TAKE 1 TABLET BY MOUTH EVERY DAY (Patient taking differently: Take 1 tablet by mouth daily with lunch. ) 90 tablet 0  . ondansetron (ZOFRAN) 8 MG tablet Take 1 tablet (8 mg total) by mouth 2 (two) times daily as needed for refractory nausea /  vomiting. Start on day 3 after cyclophosphamide chemotherapy. 30 tablet 1  . oxyCODONE-acetaminophen (PERCOCET) 5-325 MG tablet Take 1 tablet by mouth every 4 (four) hours as needed for severe pain. 20 tablet 0  . pravastatin (PRAVACHOL) 40 MG tablet TAKE 1 TABLET BY MOUTH ONCE DAILY (Patient taking differently: Take 40 mg by mouth daily with lunch. ) 90 tablet 2  . predniSONE (DELTASONE) 20 MG tablet Take 3 tablets (60 mg total) by mouth daily. Take on days 1-5 of chemotherapy. 15 tablet 6  . prochlorperazine (COMPAZINE) 10 MG tablet Take 1 tablet (10 mg total) by mouth every 6 (six) hours as needed (Nausea or vomiting). 30 tablet 6   No current facility-administered medications for this visit.     REVIEW OF SYSTEMS:    A 10+ POINT REVIEW OF SYSTEMS WAS OBTAINED including neurology, dermatology, psychiatry, cardiac, respiratory, lymph, extremities, GI, GU, Musculoskeletal, constitutional, breasts, reproductive, HEENT.  All pertinent positives are noted in the HPI.  All others are negative.   PHYSICAL EXAMINATION: ECOG PERFORMANCE STATUS: 1 - Symptomatic but completely ambulatory  Vitals:   08/26/18 0837  BP: 135/65  Pulse: 69  Resp: 17  Temp: 98.3 F (36.8 C)  SpO2: 98%   Filed Weights   08/26/18 0837  Weight: 163 lb 9.6 oz (74.2 kg)   .Body mass index is 23.47 kg/m.  GENERAL:alert, in no acute distress and comfortable SKIN: no acute rashes, no significant lesions EYES: conjunctiva are pink and non-injected, sclera anicteric OROPHARYNX: MMM, no exudates, no oropharyngeal erythema or ulceration NECK: supple, no JVD LYMPH:  Improved palpable right cervical lymph nodes. Palpable right supraclavicular Level IV and Level V lymph nodes. No palpable lymphadenopathy in the axillary or inguinal regions LUNGS: clear to auscultation b/l with normal respiratory effort HEART: regular rate & rhythm ABDOMEN:  normoactive bowel sounds , non tender, not distended. No palpable  hepatosplenomegaly.  Extremity: no pedal edema PSYCH: alert & oriented x 3 with fluent speech NEURO: no focal motor/sensory deficits   LABORATORY DATA:  I have reviewed the data as listed  . CBC  Latest Ref Rng & Units 08/26/2018 08/12/2018 08/05/2018  WBC 4.0 - 10.5 K/uL 7.3 18.5(H) 7.7  Hemoglobin 13.0 - 17.0 g/dL 12.4(L) 12.5(L) 12.8(L)  Hematocrit 39.0 - 52.0 % 39.8 39.5 41.5  Platelets 150 - 400 K/uL 140(L) 119(L) 154    . CMP Latest Ref Rng & Units 08/26/2018 08/12/2018 08/05/2018  Glucose 70 - 99 mg/dL 103(H) 116(H) 172(H)  BUN 8 - 23 mg/dL _0 Creatinine 0.61 - 1.24 mg/dL 0.82 0.91 0.95  Sodium 135 - 145 mmol/L 139 138 139  Potassium 3.5 - 5.1 mmol/L 3.7 3.6 3.5  Chloride 98 - 111 mmol/L 104 101 101  CO2 22 - 32 mmol/L _1 Calcium 8.9 - 10.3 mg/dL 8.6(L) 8.6(L) 9.0  Total Protein 6.5 - 8.1 g/dL 7.0 6.8 7.6  Total Bilirubin 0.3 - 1.2 mg/dL 0.5 0.3 0.5  Alkaline Phos 38 - 126 U/L 83 143(H) 90  AST 15 - 41 U/L _2 ALT 0 - 44 U/L _3 . Lab Results  Component Value Date   LDH 168 08/05/2018    07/08/18 Right Cervical Tissue Biopsy:    RADIOGRAPHIC STUDIES: I have personally reviewed the radiological images as listed and agreed with the findings in the report. Ir Imaging Guided Port Insertion  Result Date: 07/30/2018 INDICATION: 85 year old male with a new diagnosis of diffuse large B-cell lymphoma. He needs urgent chemotherapy and presents for port catheter placement. Due to bulky right supraclavicular and cervical chain adenopathy, his port catheter will be placed via a left internal jugular venous approach. EXAM: IMPLANTED PORT A CATH PLACEMENT WITH ULTRASOUND AND FLUOROSCOPIC GUIDANCE MEDICATIONS: 900 mg Cleocin; The antibiotic was administered within an appropriate time interval prior to skin puncture. ANESTHESIA/SEDATION: Versed 2 mg IV; Fentanyl 100 mcg IV; Moderate Sedation Time:  28 minutes The patient was continuously monitored during the  procedure by the interventional radiology nurse under my direct supervision. FLUOROSCOPY TIME:  0 minutes, 48 seconds (5 mGy) COMPLICATIONS: None immediate. PROCEDURE: The left neck and chest was prepped with chlorhexidine, and draped in the usual sterile fashion using maximum barrier technique (cap and mask, sterile gown, sterile gloves, large sterile sheet, hand hygiene and cutaneous antiseptic). Local anesthesia was attained by infiltration with 1% lidocaine with epinephrine. Ultrasound demonstrated patency of the left internal jugular vein, and this was documented with an image. Under real-time ultrasound guidance, this vein was accessed with a 21 gauge micropuncture needle and image documentation was performed. A small dermatotomy was made at the access site with an 11 scalpel. A 0.018" wire was advanced into the SVC and the access needle exchanged for a 75F micropuncture vascular sheath. The 0.018" wire was then removed and a 0.035" wire advanced into the IVC. An appropriate location for the subcutaneous reservoir was selected below the clavicle and an incision was made through the skin and underlying soft tissues. The subcutaneous tissues were then dissected using a combination of blunt and sharp surgical technique and a pocket was formed. A single lumen power injectable portacatheter was then tunneled through the subcutaneous tissues from the pocket to the dermatotomy and the port reservoir placed within the subcutaneous pocket. The venous access site was then serially dilated and a peel away vascular sheath placed over the wire. The wire was removed and the port catheter advanced into position under fluoroscopic guidance. The catheter tip is positioned in the superior cavoatrial junction. This was documented with a spot image. The portacatheter was then  tested and found to flush and aspirate well. The port was flushed with saline followed by 100 units/mL heparinized saline. The pocket was then closed in two  layers using first subdermal inverted interrupted absorbable sutures followed by a running subcuticular suture. The epidermis was then sealed with Dermabond. The dermatotomy at the venous access site was also closed with Dermabond. IMPRESSION: Successful placement of a left IJ approach Power Port with ultrasound and fluoroscopic guidance. The catheter is ready for use. Electronically Signed   By: Jacqulynn Cadet M.D.   On: 07/30/2018 15:49    ASSESSMENT & PLAN:   85 y.o. male with  1. Diffuse Large B-Cell Lymphoma, Stage IV Presenting without constitutional symptoms. Palpable right cervical and supraclavicular lymphadenopathy. 07/08/18 Right cervical soft tissue biopsy revealed Diffuse Large B-Cell Lymphoma, germinal center type  06/05/18 CT Neck revealed Pathologic RIGHT-sided level II, III, IV, and V lymphadenopathy, most consistent with metastatic carcinoma. An obvious primary source is not identified. Tissue sampling is warranted. 07/16/18 Hep B and Hep C negative  07/17/18 ECHO revealed LV EF of 60-65%  07/22/18 PET/CT revealed "Hypermetabolic adenopathy especially concentrated in the right neck but also in the left neck, chest, abdomen, and pelvis. The adenopathy is primarily Deauville 5 although some few scattered smaller lesions are Deauville 4. 2. Diffuse abnormal splenic activity, Deauville 5, without overt splenomegaly. 3. There is also hypermetabolic Deauville 5 activity in the mildly thickened distal appendix, and raising suspicion for involvement of the appendix. 4. Other imaging findings of potential clinical significance: Aortic Atherosclerosis. Coronary atherosclerosis."  PLAN: -Discussed pt labwork today, 08/26/18; blood counts and chemistries are stable, magnesium okay -The pt has no prohibitive toxicities from continuing C2 R-mini-CHOP with G-CSF support at this time. -Hold off on routine dental cleaning until after completing treatment -Recommend Tylenol and Claritin for mild  Neulasta related bone pains -Pt will be able to receive the rapid protocol of Rituxan with C2 onwards -Discussed the balance between using the most effective dose with treatment tolerance. -Recommend R-mini-CHOP up to 6 cycles with G-CSF support and possibly consolidative RT. -continue same nausea and other supportive medications. -Discussed the characteristics of DLBCL which increase risk of brain recurrence. Pt is not high risk category, but is intermediate risk given bowel involvement. At pt's age, I do not recommend prophylactic IT MTX with concerns for tolerance and potentially causing delays in primary induction treatment. -Advised infection prevention strategies, crowd avoidance, and frequent hand washing -Recommend Miralax, and Senna S at night time -Will see the pt back on C3D1   RTC with Dr Irene Limbo with labs with C3D1 of chemotherapy plz schedule C3 and C4 of chemotherapy   All of the patients questions were answered with apparent satisfaction. The patient knows to call the clinic with any problems, questions or concerns.  The total time spent in the appt was 25 minutes and more than 50% was on counseling and direct patient cares.    Sullivan Lone MD MS AAHIVMS [facility] Hematology/Oncology Physician [facility]  (Office):       [phone] (Work cell):  [phone] (Fax):           [phone]  08/26/2018 9:20 AM  I, Baldwin Jamaica, am acting as a scribe for Dr. Sullivan Lone.   .I have reviewed the above documentation for accuracy and completeness, and I agree with the above. Brunetta Genera MD

## 2018-08-26 ENCOUNTER — Inpatient Hospital Stay: Payer: Medicare Other | Attending: Hematology

## 2018-08-26 ENCOUNTER — Telehealth: Payer: Self-pay | Admitting: Hematology

## 2018-08-26 ENCOUNTER — Inpatient Hospital Stay: Payer: Medicare Other

## 2018-08-26 ENCOUNTER — Other Ambulatory Visit: Payer: Self-pay

## 2018-08-26 ENCOUNTER — Inpatient Hospital Stay (HOSPITAL_BASED_OUTPATIENT_CLINIC_OR_DEPARTMENT_OTHER): Payer: Medicare Other | Admitting: Hematology

## 2018-08-26 VITALS — BP 135/59 | HR 57 | Temp 98.1°F | Resp 16

## 2018-08-26 VITALS — BP 135/65 | HR 69 | Temp 98.3°F | Resp 17 | Ht 70.0 in | Wt 163.6 lb

## 2018-08-26 DIAGNOSIS — Z8546 Personal history of malignant neoplasm of prostate: Secondary | ICD-10-CM | POA: Diagnosis not present

## 2018-08-26 DIAGNOSIS — I10 Essential (primary) hypertension: Secondary | ICD-10-CM | POA: Insufficient documentation

## 2018-08-26 DIAGNOSIS — C8331 Diffuse large B-cell lymphoma, lymph nodes of head, face, and neck: Secondary | ICD-10-CM | POA: Insufficient documentation

## 2018-08-26 DIAGNOSIS — Z79899 Other long term (current) drug therapy: Secondary | ICD-10-CM | POA: Diagnosis not present

## 2018-08-26 DIAGNOSIS — Z87891 Personal history of nicotine dependence: Secondary | ICD-10-CM

## 2018-08-26 DIAGNOSIS — Z9079 Acquired absence of other genital organ(s): Secondary | ICD-10-CM | POA: Diagnosis not present

## 2018-08-26 DIAGNOSIS — Z5112 Encounter for antineoplastic immunotherapy: Secondary | ICD-10-CM | POA: Diagnosis not present

## 2018-08-26 DIAGNOSIS — Z5189 Encounter for other specified aftercare: Secondary | ICD-10-CM | POA: Insufficient documentation

## 2018-08-26 DIAGNOSIS — Z5111 Encounter for antineoplastic chemotherapy: Secondary | ICD-10-CM | POA: Insufficient documentation

## 2018-08-26 DIAGNOSIS — Z95828 Presence of other vascular implants and grafts: Secondary | ICD-10-CM

## 2018-08-26 LAB — CBC WITH DIFFERENTIAL/PLATELET
Abs Immature Granulocytes: 0.08 10*3/uL — ABNORMAL HIGH (ref 0.00–0.07)
Basophils Absolute: 0 10*3/uL (ref 0.0–0.1)
Basophils Relative: 0 %
Eosinophils Absolute: 0 10*3/uL (ref 0.0–0.5)
Eosinophils Relative: 0 %
HCT: 39.8 % (ref 39.0–52.0)
Hemoglobin: 12.4 g/dL — ABNORMAL LOW (ref 13.0–17.0)
Immature Granulocytes: 1 %
Lymphocytes Relative: 23 %
Lymphs Abs: 1.7 10*3/uL (ref 0.7–4.0)
MCH: 28.1 pg (ref 26.0–34.0)
MCHC: 31.2 g/dL (ref 30.0–36.0)
MCV: 90 fL (ref 80.0–100.0)
Monocytes Absolute: 1.1 10*3/uL — ABNORMAL HIGH (ref 0.1–1.0)
Monocytes Relative: 15 %
Neutro Abs: 4.4 10*3/uL (ref 1.7–7.7)
Neutrophils Relative %: 61 %
Platelets: 140 10*3/uL — ABNORMAL LOW (ref 150–400)
RBC: 4.42 MIL/uL (ref 4.22–5.81)
RDW: 13.9 % (ref 11.5–15.5)
WBC: 7.3 10*3/uL (ref 4.0–10.5)
nRBC: 0 % (ref 0.0–0.2)

## 2018-08-26 LAB — MAGNESIUM: Magnesium: 1.8 mg/dL (ref 1.7–2.4)

## 2018-08-26 LAB — CMP (CANCER CENTER ONLY)
ALT: 15 U/L (ref 0–44)
AST: 16 U/L (ref 15–41)
Albumin: 3.9 g/dL (ref 3.5–5.0)
Alkaline Phosphatase: 83 U/L (ref 38–126)
Anion gap: 9 (ref 5–15)
BUN: 19 mg/dL (ref 8–23)
CO2: 26 mmol/L (ref 22–32)
Calcium: 8.6 mg/dL — ABNORMAL LOW (ref 8.9–10.3)
Chloride: 104 mmol/L (ref 98–111)
Creatinine: 0.82 mg/dL (ref 0.61–1.24)
GFR, Est AFR Am: >60 mL/min (ref >60)
GFR, Estimated: >60 mL/min (ref >60)
Glucose, Bld: 103 mg/dL — ABNORMAL HIGH (ref 70–99)
Potassium: 3.7 mmol/L (ref 3.5–5.1)
Sodium: 139 mmol/L (ref 135–145)
Total Bilirubin: 0.5 mg/dL (ref 0.3–1.2)
Total Protein: 7 g/dL (ref 6.5–8.1)

## 2018-08-26 MED ORDER — SODIUM CHLORIDE 0.9 % IV SOLN
375.0000 mg/m2 | Freq: Once | INTRAVENOUS | Status: AC
  Administered 2018-08-26: 12:00:00 700 mg via INTRAVENOUS
  Filled 2018-08-26: qty 50

## 2018-08-26 MED ORDER — DIPHENHYDRAMINE HCL 25 MG PO CAPS
25.0000 mg | ORAL_CAPSULE | Freq: Once | ORAL | Status: AC
  Administered 2018-08-26: 10:00:00 25 mg via ORAL

## 2018-08-26 MED ORDER — DEXAMETHASONE SODIUM PHOSPHATE 10 MG/ML IJ SOLN
INTRAMUSCULAR | Status: AC
  Filled 2018-08-26: qty 1

## 2018-08-26 MED ORDER — ACETAMINOPHEN 325 MG PO TABS
650.0000 mg | ORAL_TABLET | Freq: Once | ORAL | Status: AC
  Administered 2018-08-26: 10:00:00 650 mg via ORAL

## 2018-08-26 MED ORDER — SODIUM CHLORIDE 0.9 % IV SOLN
400.0000 mg/m2 | Freq: Once | INTRAVENOUS | Status: AC
  Administered 2018-08-26: 780 mg via INTRAVENOUS
  Filled 2018-08-26: qty 39

## 2018-08-26 MED ORDER — HEPARIN SOD (PORK) LOCK FLUSH 100 UNIT/ML IV SOLN
500.0000 [IU] | Freq: Once | INTRAVENOUS | Status: AC | PRN
  Administered 2018-08-26: 14:00:00 500 [IU]
  Filled 2018-08-26: qty 5

## 2018-08-26 MED ORDER — DOXORUBICIN HCL CHEMO IV INJECTION 2 MG/ML
25.0000 mg/m2 | Freq: Once | INTRAVENOUS | Status: AC
  Administered 2018-08-26: 11:00:00 48 mg via INTRAVENOUS
  Filled 2018-08-26: qty 24

## 2018-08-26 MED ORDER — ACETAMINOPHEN 325 MG PO TABS
ORAL_TABLET | ORAL | Status: AC
  Filled 2018-08-26: qty 2

## 2018-08-26 MED ORDER — FAMOTIDINE IN NACL 20-0.9 MG/50ML-% IV SOLN
20.0000 mg | Freq: Once | INTRAVENOUS | Status: AC
  Administered 2018-08-26: 10:00:00 20 mg via INTRAVENOUS

## 2018-08-26 MED ORDER — SODIUM CHLORIDE 0.9% FLUSH
10.0000 mL | INTRAVENOUS | Status: DC | PRN
  Administered 2018-08-26: 14:00:00 10 mL
  Filled 2018-08-26: qty 10

## 2018-08-26 MED ORDER — SODIUM CHLORIDE 0.9% FLUSH
10.0000 mL | INTRAVENOUS | Status: DC | PRN
  Administered 2018-08-26: 10 mL via INTRAVENOUS
  Filled 2018-08-26: qty 10

## 2018-08-26 MED ORDER — DEXAMETHASONE SODIUM PHOSPHATE 10 MG/ML IJ SOLN
10.0000 mg | Freq: Once | INTRAMUSCULAR | Status: AC
  Administered 2018-08-26: 10 mg via INTRAVENOUS

## 2018-08-26 MED ORDER — PALONOSETRON HCL INJECTION 0.25 MG/5ML
INTRAVENOUS | Status: AC
  Filled 2018-08-26: qty 5

## 2018-08-26 MED ORDER — DIPHENHYDRAMINE HCL 25 MG PO CAPS
ORAL_CAPSULE | ORAL | Status: AC
  Filled 2018-08-26: qty 1

## 2018-08-26 MED ORDER — VINCRISTINE SULFATE CHEMO INJECTION 1 MG/ML
1.0000 mg | Freq: Once | INTRAVENOUS | Status: AC
  Administered 2018-08-26: 11:00:00 1 mg via INTRAVENOUS
  Filled 2018-08-26: qty 1

## 2018-08-26 MED ORDER — SODIUM CHLORIDE 0.9 % IV SOLN
Freq: Once | INTRAVENOUS | Status: AC
  Administered 2018-08-26: 10:00:00 via INTRAVENOUS
  Filled 2018-08-26: qty 250

## 2018-08-26 MED ORDER — PALONOSETRON HCL INJECTION 0.25 MG/5ML
0.2500 mg | Freq: Once | INTRAVENOUS | Status: AC
  Administered 2018-08-26: 10:00:00 0.25 mg via INTRAVENOUS

## 2018-08-26 MED ORDER — FAMOTIDINE IN NACL 20-0.9 MG/50ML-% IV SOLN
INTRAVENOUS | Status: AC
  Filled 2018-08-26: qty 50

## 2018-08-26 NOTE — Telephone Encounter (Signed)
Per 6/1 los appts already scheduled.

## 2018-08-26 NOTE — Patient Instructions (Signed)
[facility] Discharge Instructions for Patients Receiving Chemotherapy  Today you received the following chemotherapy agents:  Doxorubicin, Vincristine, Cytoxan, Rituxan  To help prevent nausea and vomiting after your treatment, we encourage you to take your nausea medication as prescribed.   If you develop nausea and vomiting that is not controlled by your nausea medication, call the clinic.   BELOW ARE SYMPTOMS THAT SHOULD BE REPORTED IMMEDIATELY:  *FEVER GREATER THAN 100.5 F  *CHILLS WITH OR WITHOUT FEVER  NAUSEA AND VOMITING THAT IS NOT CONTROLLED WITH YOUR NAUSEA MEDICATION  *UNUSUAL SHORTNESS OF BREATH  *UNUSUAL BRUISING OR BLEEDING  TENDERNESS IN MOUTH AND THROAT WITH OR WITHOUT PRESENCE OF ULCERS  *URINARY PROBLEMS  *BOWEL PROBLEMS  UNUSUAL RASH Items with * indicate a potential emergency and should be followed up as soon as possible.  Feel free to call the clinic should you have any questions or concerns. The clinic phone number is [phone].  Please show the CHEMO ALERT CARD at check-in to the Emergency Department and triage nurse.   

## 2018-08-28 ENCOUNTER — Other Ambulatory Visit: Payer: Self-pay

## 2018-08-28 ENCOUNTER — Inpatient Hospital Stay: Payer: Medicare Other

## 2018-08-28 DIAGNOSIS — Z87891 Personal history of nicotine dependence: Secondary | ICD-10-CM | POA: Diagnosis not present

## 2018-08-28 DIAGNOSIS — C8331 Diffuse large B-cell lymphoma, lymph nodes of head, face, and neck: Secondary | ICD-10-CM

## 2018-08-28 DIAGNOSIS — I10 Essential (primary) hypertension: Secondary | ICD-10-CM | POA: Diagnosis not present

## 2018-08-28 DIAGNOSIS — Z5189 Encounter for other specified aftercare: Secondary | ICD-10-CM | POA: Diagnosis not present

## 2018-08-28 DIAGNOSIS — Z5111 Encounter for antineoplastic chemotherapy: Secondary | ICD-10-CM | POA: Diagnosis not present

## 2018-08-28 DIAGNOSIS — Z5112 Encounter for antineoplastic immunotherapy: Secondary | ICD-10-CM | POA: Diagnosis not present

## 2018-08-28 MED ORDER — PEGFILGRASTIM-CBQV 6 MG/0.6ML ~~LOC~~ SOSY
PREFILLED_SYRINGE | SUBCUTANEOUS | Status: AC
  Filled 2018-08-28: qty 0.6

## 2018-08-28 MED ORDER — PEGFILGRASTIM-CBQV 6 MG/0.6ML ~~LOC~~ SOSY
6.0000 mg | PREFILLED_SYRINGE | Freq: Once | SUBCUTANEOUS | Status: AC
  Administered 2018-08-28: 6 mg via SUBCUTANEOUS

## 2018-08-28 NOTE — Patient Instructions (Signed)
Pegfilgrastim injection  What is this medicine?  PEGFILGRASTIM (PEG fil gra stim) is a long-acting granulocyte colony-stimulating factor that stimulates the growth of neutrophils, a type of white blood cell important in the body's fight against infection. It is used to reduce the incidence of fever and infection in patients with certain types of cancer who are receiving chemotherapy that affects the bone marrow, and to increase survival after being exposed to high doses of radiation.  This medicine may be used for other purposes; ask your health care provider or pharmacist if you have questions.  COMMON BRAND NAME(S): Fulphila, Neulasta, UDENYCA  What should I tell my health care provider before I take this medicine?  They need to know if you have any of these conditions:  -kidney disease  -latex allergy  -ongoing radiation therapy  -sickle cell disease  -skin reactions to acrylic adhesives (On-Body Injector only)  -an unusual or allergic reaction to pegfilgrastim, filgrastim, other medicines, foods, dyes, or preservatives  -pregnant or trying to get pregnant  -breast-feeding  How should I use this medicine?  This medicine is for injection under the skin. If you get this medicine at home, you will be taught how to prepare and give the pre-filled syringe or how to use the On-body Injector. Refer to the patient Instructions for Use for detailed instructions. Use exactly as directed. Tell your healthcare provider immediately if you suspect that the On-body Injector may not have performed as intended or if you suspect the use of the On-body Injector resulted in a missed or partial dose.  It is important that you put your used needles and syringes in a special sharps container. Do not put them in a trash can. If you do not have a sharps container, call your pharmacist or healthcare provider to get one.  Talk to your pediatrician regarding the use of this medicine in children. While this drug may be prescribed for  selected conditions, precautions do apply.  Overdosage: If you think you have taken too much of this medicine contact a poison control center or emergency room at once.  NOTE: This medicine is only for you. Do not share this medicine with others.  What if I miss a dose?  It is important not to miss your dose. Call your doctor or health care professional if you miss your dose. If you miss a dose due to an On-body Injector failure or leakage, a new dose should be administered as soon as possible using a single prefilled syringe for manual use.  What may interact with this medicine?  Interactions have not been studied.  Give your health care provider a list of all the medicines, herbs, non-prescription drugs, or dietary supplements you use. Also tell them if you smoke, drink alcohol, or use illegal drugs. Some items may interact with your medicine.  This list may not describe all possible interactions. Give your health care provider a list of all the medicines, herbs, non-prescription drugs, or dietary supplements you use. Also tell them if you smoke, drink alcohol, or use illegal drugs. Some items may interact with your medicine.  What should I watch for while using this medicine?  You may need blood work done while you are taking this medicine.  If you are going to need a MRI, CT scan, or other procedure, tell your doctor that you are using this medicine (On-Body Injector only).  What side effects may I notice from receiving this medicine?  Side effects that you should report to   your doctor or health care professional as soon as possible:  -allergic reactions like skin rash, itching or hives, swelling of the face, lips, or tongue  -back pain  -dizziness  -fever  -pain, redness, or irritation at site where injected  -pinpoint red spots on the skin  -red or dark-brown urine  -shortness of breath or breathing problems  -stomach or side pain, or pain at the shoulder  -swelling  -tiredness  -trouble passing urine or  change in the amount of urine  Side effects that usually do not require medical attention (report to your doctor or health care professional if they continue or are bothersome):  -bone pain  -muscle pain  This list may not describe all possible side effects. Call your doctor for medical advice about side effects. You may report side effects to FDA at 1-800-FDA-1088.  Where should I keep my medicine?  Keep out of the reach of children.  If you are using this medicine at home, you will be instructed on how to store it. Throw away any unused medicine after the expiration date on the label.  NOTE: This sheet is a summary. It may not cover all possible information. If you have questions about this medicine, talk to your doctor, pharmacist, or health care provider.   2019 [employer] Standard (2017-06-18 16:57:08)

## 2018-09-01 ENCOUNTER — Other Ambulatory Visit: Payer: Self-pay | Admitting: Family Medicine

## 2018-09-01 DIAGNOSIS — I10 Essential (primary) hypertension: Secondary | ICD-10-CM

## 2018-09-05 ENCOUNTER — Other Ambulatory Visit: Payer: Self-pay | Admitting: Hematology

## 2018-09-05 DIAGNOSIS — C8331 Diffuse large B-cell lymphoma, lymph nodes of head, face, and neck: Secondary | ICD-10-CM

## 2018-09-07 ENCOUNTER — Other Ambulatory Visit: Payer: Self-pay | Admitting: Hematology

## 2018-09-07 DIAGNOSIS — C8331 Diffuse large B-cell lymphoma, lymph nodes of head, face, and neck: Secondary | ICD-10-CM

## 2018-09-11 ENCOUNTER — Ambulatory Visit (INDEPENDENT_AMBULATORY_CARE_PROVIDER_SITE_OTHER): Payer: Medicare Other | Admitting: Family Medicine

## 2018-09-11 ENCOUNTER — Encounter: Payer: Self-pay | Admitting: Family Medicine

## 2018-09-11 ENCOUNTER — Other Ambulatory Visit: Payer: Self-pay

## 2018-09-11 VITALS — BP 118/70 | HR 59 | Temp 97.5°F | Ht 69.0 in | Wt 161.2 lb

## 2018-09-11 DIAGNOSIS — I10 Essential (primary) hypertension: Secondary | ICD-10-CM

## 2018-09-11 DIAGNOSIS — C8331 Diffuse large B-cell lymphoma, lymph nodes of head, face, and neck: Secondary | ICD-10-CM

## 2018-09-11 DIAGNOSIS — Z8546 Personal history of malignant neoplasm of prostate: Secondary | ICD-10-CM | POA: Diagnosis not present

## 2018-09-11 DIAGNOSIS — E785 Hyperlipidemia, unspecified: Secondary | ICD-10-CM | POA: Diagnosis not present

## 2018-09-11 MED ORDER — PRAVASTATIN SODIUM 40 MG PO TABS: 40.0000 mg | ORAL_TABLET | Freq: Every day | ORAL | 2 refills | Status: DC

## 2018-09-11 MED ORDER — LOSARTAN POTASSIUM-HCTZ 50-12.5 MG PO TABS: 1.0000 | ORAL_TABLET | Freq: Every day | ORAL | 3 refills | Status: DC

## 2018-09-11 NOTE — Progress Notes (Signed)
Mario Garcia is a 85 y.o. male who presents for annual wellness visit and follow-up on chronic medical conditions.  He has noted that after he eats, he sometimes gets a tightness in his chest that is relieved with Alka-Seltzer.  He is currently in the process of being treated for a B-cell lymphoma and has had 2 chemotherapy treatments.  He seems to have tolerated this well.  His immunizations were reviewed and he does need a Tdap.  He continues on his losartan/HCTZ.  Presently he is not taking an H2 blocker.  Does have a previous history of prostate cancer however this is remote.  He continues on Pravachol and is having no difficulty with that.   Immunizations and Health Maintenance Immunization History  Administered Date(s) Administered  . Influenza Split 12/21/2010, 11/29/2011  . Influenza, High Dose Seasonal PF 12/22/2013, 12/03/2014, 12/21/2015, 12/18/2016, 12/13/2017  . Influenza,inj,Quad PF,6+ Mos 12/23/2012  . Pneumococcal Conjugate-13 12/03/2014  . Pneumococcal Polysaccharide-23 08/21/2002  . Td 03/27/1998  . Tdap 09/30/2007  . Zoster 09/30/2007  . Zoster Recombinat (Shingrix) 07/21/2017, 09/20/2017   Health Maintenance Due  Topic Date Due  . TETANUS/TDAP  09/29/2017    Last colonoscopy: 09/21/2005 Last PSA: 11/06/17 Dentist: q six months Ophtho:05/2018 Exercise:walking   Other doctors caring for patient include: Dr Herbert Deaner eye, Dr. Irene Limbo oncology , Dr. Benjamine Mola ENT  Advanced Directives: No Info given    Depression screen:  See questionnaire below.     Depression screen [facility] 2/9 09/11/2018 01/23/2018 08/24/2016  Decreased Interest 0 0 0  Down, Depressed, Hopeless - 0 0  PHQ - 2 Score 0 0 0    Fall Screen: See Questionaire below.   Fall Risk  09/11/2018 01/23/2018 08/24/2016  Falls in the past year? 0 No No    ADL screen:  See questionnaire below.  Functional Status Survey: Is the patient deaf or have difficulty hearing?: Yes(hearing aid) Does the patient have difficulty  seeing, even when wearing glasses/contacts?: No Does the patient have difficulty concentrating, remembering, or making decisions?: No Does the patient have difficulty walking or climbing stairs?: No Does the patient have difficulty dressing or bathing?: No Does the patient have difficulty doing errands alone such as visiting a doctor's office or shopping?: No     PHYSICAL EXAM:   General Appearance: Alert, cooperative, no distress, appears stated age Head: Normocephalic, without obvious abnormality, atraumatic  Psych: Normal mood, affect, hygiene and grooming  ASSESSMENT/PLAN: Diffuse large B-cell lymphoma of lymph nodes of neck ([place]) - Plan: He will continue to be followed by Dr Irene Limbo  Hyperlipidemia with target LDL less than 100 - Plan: pravastatin (PRAVACHOL) 40 MG tablet, .  Essential hypertension - Plan: losartan-hydrochlorothiazide (HYZAAR) 50-12.5 MG tablet, .  History of prostate cancer - Plan: Follow-up as per urology.  Did recommend that the next time he has chest discomfort, use Maalox or Mylanta and if it makes this go away as Alka-Seltzer did then he should be fine.  Explained that this does not sound cardiac related.     Medicare Attestation I have personally reviewed: The patient's medical and social history Their use of alcohol, tobacco or illicit drugs Their current medications and supplements The patient's functional ability including ADLs,fall risks, home safety risks, cognitive, and hearing and visual impairment Diet and physical activities Evidence for depression or mood disorders  The patient's weight, height, and BMI have been recorded in the chart.  I have made referrals, counseling, and provided education to the patient based on review  of the above and I have provided the patient with a written personalized care plan for preventive services.     Jill Alexanders, MD   09/11/2018

## 2018-09-11 NOTE — Patient Instructions (Signed)
  Mario Garcia , Thank you for taking time to come for your Medicare Wellness Visit. I appreciate your ongoing commitment to your health goals. Please review the following plan we discussed and let me know if I can assist you in the future.   These are the goals we discussed: The next time he had the chest discomfort use liquid Maalox or Mylanta . This is a list of the screening recommended for you and due dates:  Health Maintenance  Topic Date Due  . Tetanus Vaccine  09/29/2017  . Flu Shot  10/26/2018  . Pneumonia vaccines  Completed

## 2018-09-13 ENCOUNTER — Other Ambulatory Visit: Payer: Self-pay

## 2018-09-13 DIAGNOSIS — C8331 Diffuse large B-cell lymphoma, lymph nodes of head, face, and neck: Secondary | ICD-10-CM

## 2018-09-13 NOTE — Progress Notes (Signed)
HEMATOLOGY/ONCOLOGY CLINIC NOTE  Date of Service: 09/16/2018  Patient Care Team: Denita Lung, MD as PCP - General (Family Medicine)  CHIEF COMPLAINTS/PURPOSE OF CONSULTATION:  Diffuse Large B-Cell Lymphoma  HISTORY OF PRESENTING ILLNESS:   Mario Garcia is a wonderful 85 y.o. male who has been referred to [place] by Dr. Jill Alexanders for evaluation and management of Diffuse Large B-Cell Lymphoma. The pt reports that he is doing well overall.   The pt reports that he feels that he has been a little more tired recently. He first noticed some "nodules" on his neck appear "4-6 weeks ago." He notes that these nodules haven't been painful. He appears to have presented to care with his PCP on 05/28/18, Dr. Redmond School. He notes that he has noticed that he has been sweating more in the last year. He denies noticing any other new lumps or bumps. He denies any fevers, chills, drenching night sweats, or unexpected weight loss. He notes that he has had some changes in swallowing over the last 6 months, which he characterizes as "getting strangled on his saliva every now and then." He denies abdominal pains, acid reflux, changes in bowel habits, leg swelling, skin rashes. He endorses a deep pain "every once in a while," in his left groin. He denies headaches. He endorses glaucoma and a history of cataracts. He sees Dr. Hetty Blend in ophthalmology. He denies any other concern in the last 6 months.  The pt notes that he lives independently with his wife and still is able to do all he wants to do. He walks on trails with his wife and notes that he can generally walk as far as he likes to.  The pt notes that he had prostate cancer in 2008 or 2009, s/p prostatectomy. He did not require RT nor systemic treatments. He denies lung, heart or kidney problems.  Of note prior to the patient's visit today, pt has had a CT Neck completed on 06/05/18 with results revealing Pathologic RIGHT-sided level II, III, IV, and V  lymphadenopathy, most consistent with metastatic carcinoma. An obvious primary source is not identified. Tissue sampling is warranted.  Most recent lab results (07/08/18) of CBC and CMP is as follows: all values are WNL except for PLT at 129k.  On review of systems, pt reports slightly more tired, neck nodules, some changes in swallowing, staying active, and denies fevers, chills, drenching night sweats, unexpected weight loss, abdominal pains, changes in bowel habits, skin rashes, leg swelling, CP, SOB, difficulty breathing, headaches, other lumps or bumps, skin lesions, mouth sores, pain along the spine, back pain, leg swelling, and any other symptoms.   On PMHx the pt reports localized Prostate cancer in 2008 s/p prostatectomy.  On Social Hx the pt reports that he quit smoking over 50 years ago. He notes that he consumes about 1 glass of wine every day, without concerns for excessive drinking. 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 He denies concern for chemical or radiation exposure. On Family Hx the pt reports wife with Stage IV Non Hodgkin's Lymphoma, paternal grandmother with leukemia in her 87s, father with unspecified abdominal cancer.  Interval History:   Mario Garcia returns today for management and evaluation of his newly diagnosed Diffuse Large B-Cell Lymphoma. The patient's last visit with [place] was on 08/26/18. The pt reports that he is doing well overall.  The pt reports that his cervical lymph nodes have continued to resolve. He  adds that he noticing a small lump in his inguinal area in the interim, but that "then it went away on its own." He notes that he has had a few mild night sweats but nothing drenching, and denies fevers or chills. He denies any problems tolerating treatment thus far and denies nausea, vomiting, and diarrhea. He has been using stool softeners around the time of infusions and notes that he has been able to stay regular.  Lab results  today (09/16/18) of CBC w/diff and CMP is as follows: all values are WNL except for HGB at 12.4, HCT at 38.8, PLT at 134k, Monocytes abs at 1.2k.  On review of systems, pt reports moving his bowels well, good energy levels, and denies fevers, chills, drenching night sweats, diarrhea, nausea, vomiting, constipation, abdominal pains, and any other symptoms.   MEDICAL HISTORY:  Past Medical History:  Diagnosis Date  . Cancer ([place])    PROSTATE  . History of colonic polyps   . History of kidney stones   . Hypertension   . Inguinal hernia 03/2002  . Pneumonia    "years ago"    SURGICAL HISTORY: Past Surgical History:  Procedure Laterality Date  . COLONOSCOPY    . DIRECT LARYNGOSCOPY N/A 07/08/2018   Procedure: DIRECT LARYNGOSCOPY;  Surgeon: Leta Baptist, MD;  Location: [facility];  Service: ENT;  Laterality: N/A;  . ESOPHAGOSCOPY N/A 07/08/2018   Procedure: ESOPHAGOSCOPY;  Surgeon: Leta Baptist, MD;  Location: [facility];  Service: ENT;  Laterality: N/A;  . FLEXIBLE BRONCHOSCOPY N/A 07/08/2018   Procedure: FLEXIBLE BRONCHOSCOPY;  Surgeon: Leta Baptist, MD;  Location: [facility];  Service: ENT;  Laterality: N/A;  . HERNIA REPAIR Left    inguinal hernia  . IR IMAGING GUIDED PORT INSERTION  07/30/2018  . MASS BIOPSY Right 07/08/2018   Procedure: RIGHT NECK MASS EXCISIONAL BIOPSY;  Surgeon: Leta Baptist, MD;  Location: [facility];  Service: ENT;  Laterality: Right;  . PATELLA FRACTURE SURGERY Left   . PROSTATE SURGERY  10/2005   RADIAL PROSTATECTOMY  . ROTATOR CUFF REPAIR Bilateral     SOCIAL HISTORY: Social History   Socioeconomic History  . Marital status: Married    Spouse name: Not on file  . Number of children: Not on file  . Years of education: Not on file  . Highest education level: Not on file  Occupational History  . Not on file  Social Needs  . Financial resource strain: Not on file  . Food insecurity    Worry: Not on file    Inability: Not on file  . Transportation needs    Medical: Not on file     Non-medical: Not on file  Tobacco Use  . Smoking status: Former Research scientist (life sciences)  . Smokeless tobacco: Never Used  . Tobacco comment: stopped 60 years ago  Substance and Sexual Activity  . Alcohol use: Yes    Alcohol/week: 6.0 standard drinks    Types: 6 Standard drinks or equivalent per week  . Drug use: No  . Sexual activity: Not Currently  Lifestyle  . Physical activity    Days per week: Not on file    Minutes per session: Not on file  . Stress: Not on file  Relationships  . Social Herbalist on phone: Not on file    Gets together: Not on file    Attends religious service: Not on file    Active member of club or organization: Not on file    Attends  meetings of clubs or organizations: Not on file    Relationship status: Not on file  . Intimate partner violence    Fear of current or ex partner: Not on file    Emotionally abused: Not on file    Physically abused: Not on file    Forced sexual activity: Not on file  Other Topics Concern  . Not on file  Social History Narrative  . Not on file    FAMILY HISTORY: Family History  Problem Relation Age of Onset  . Cancer Father   . Cancer Brother     ALLERGIES:  is allergic to lisinopril and penicillins.  MEDICATIONS:  Current Outpatient Medications  Medication Sig Dispense Refill  . allopurinol (ZYLOPRIM) 300 MG tablet Take 0.5 tablets (150 mg total) by mouth daily. 30 tablet 0  . Carboxymethylcellulose Sodium (ARTIFICIAL TEARS OP) Place 1 drop into both eyes daily as needed (dry eyes).    . dorzolamide (TRUSOPT) 2 % ophthalmic solution Place 1 drop into both eyes 2 (two) times daily.    Marland Kitchen latanoprost (XALATAN) 0.005 % ophthalmic solution Place 1 drop into both eyes at bedtime.    . lidocaine-prilocaine (EMLA) cream Apply to affected area once 30 g 3  . losartan-hydrochlorothiazide (HYZAAR) 50-12.5 MG tablet Take 1 tablet by mouth daily. 90 tablet 3  . ondansetron (ZOFRAN) 8 MG tablet Take 1 tablet (8 mg total) by mouth  2 (two) times daily as needed for refractory nausea / vomiting. Start on day 3 after cyclophosphamide chemotherapy. 30 tablet 1  . oxyCODONE-acetaminophen (PERCOCET) 5-325 MG tablet Take 1 tablet by mouth every 4 (four) hours as needed for severe pain. 20 tablet 0  . pravastatin (PRAVACHOL) 40 MG tablet Take 1 tablet (40 mg total) by mouth daily. 90 tablet 2  . predniSONE (DELTASONE) 20 MG tablet Take 3 tablets (60 mg total) by mouth daily. Take on days 1-5 of chemotherapy. 15 tablet 6  . prochlorperazine (COMPAZINE) 10 MG tablet Take 1 tablet (10 mg total) by mouth every 6 (six) hours as needed (Nausea or vomiting). (Patient not taking: Reported on 09/11/2018) 30 tablet 6   No current facility-administered medications for this visit.     REVIEW OF SYSTEMS:    A 10+ POINT REVIEW OF SYSTEMS WAS OBTAINED including neurology, dermatology, psychiatry, cardiac, respiratory, lymph, extremities, GI, GU, Musculoskeletal, constitutional, breasts, reproductive, HEENT.  All pertinent positives are noted in the HPI.  All others are negative.  PHYSICAL EXAMINATION: ECOG PERFORMANCE STATUS: 1 - Symptomatic but completely ambulatory  Vitals:   09/16/18 1129  BP: 126/76  Pulse: 63  Resp: 17  Temp: 98.7 F (37.1 C)  SpO2: 97%   Filed Weights   09/16/18 1129  Weight: 162 lb 14.4 oz (73.9 kg)   .Body mass index is 24.06 kg/m.  GENERAL:alert, in no acute distress and comfortable SKIN: no acute rashes, no significant lesions EYES: conjunctiva are pink and non-injected, sclera anicteric OROPHARYNX: MMM, no exudates, no oropharyngeal erythema or ulceration NECK: supple, no JVD LYMPH: Improved palpable right cervical lymph nodes. Improved right supraclavicular lymph nodes. No palpable lymphadenopathy in the axillary or inguinal regions LUNGS: clear to auscultation b/l with normal respiratory effort HEART: regular rate & rhythm ABDOMEN:  normoactive bowel sounds , non tender, not distended. No palpable  hepatosplenomegaly.  Extremity: no pedal edema PSYCH: alert & oriented x 3 with fluent speech NEURO: no focal motor/sensory deficits   LABORATORY DATA:  I have reviewed the data as listed  . CBC  Latest Ref Rng & Units 09/16/2018 08/26/2018 08/12/2018  WBC 4.0 - 10.5 K/uL 6.8 7.3 18.5(H)  Hemoglobin 13.0 - 17.0 g/dL 12.4(L) 12.4(L) 12.5(L)  Hematocrit 39.0 - 52.0 % 38.8(L) 39.8 39.5  Platelets 150 - 400 K/uL 134(L) 140(L) 119(L)    . CMP Latest Ref Rng & Units 09/16/2018 08/26/2018 08/12/2018  Glucose 70 - 99 mg/dL 75 103(H) 116(H)  BUN 8 - 23 mg/dL _0 Creatinine 0.61 - 1.24 mg/dL 0.96 0.82 0.91  Sodium 135 - 145 mmol/L 139 139 138  Potassium 3.5 - 5.1 mmol/L 3.9 3.7 3.6  Chloride 98 - 111 mmol/L 105 104 101  CO2 22 - 32 mmol/L _1 Calcium 8.9 - 10.3 mg/dL 8.9 8.6(L) 8.6(L)  Total Protein 6.5 - 8.1 g/dL 7.0 7.0 6.8  Total Bilirubin 0.3 - 1.2 mg/dL 0.5 0.5 0.3  Alkaline Phos 38 - 126 U/L 76 83 143(H)  AST 15 - 41 U/L _2 ALT 0 - 44 U/L _3 . Lab Results  Component Value Date   LDH 168 08/05/2018    07/08/18 Right Cervical Tissue Biopsy:    RADIOGRAPHIC STUDIES: I have personally reviewed the radiological images as listed and agreed with the findings in the report. No results found.  ASSESSMENT & PLAN:   85 y.o. male with  1. Diffuse Large B-Cell Lymphoma, Stage IV Presenting without constitutional symptoms. Palpable right cervical and supraclavicular lymphadenopathy. 07/08/18 Right cervical soft tissue biopsy revealed Diffuse Large B-Cell Lymphoma, germinal center type  06/05/18 CT Neck revealed Pathologic RIGHT-sided level II, III, IV, and V lymphadenopathy, most consistent with metastatic carcinoma. An obvious primary source is not identified. Tissue sampling is warranted. 07/16/18 Hep B and Hep C negative  07/17/18 ECHO revealed LV EF of 60-65%  07/22/18 PET/CT revealed "Hypermetabolic adenopathy especially concentrated in the right neck but  also in the left neck, chest, abdomen, and pelvis. The adenopathy is primarily Deauville 5 although some few scattered smaller lesions are Deauville 4. 2. Diffuse abnormal splenic activity, Deauville 5, without overt splenomegaly. 3. There is also hypermetabolic Deauville 5 activity in the mildly thickened distal appendix, and raising suspicion for involvement of the appendix. 4. Other imaging findings of potential clinical significance: Aortic Atherosclerosis. Coronary atherosclerosis."  PLAN: -Discussed pt labwork today, 09/16/18; blood counts are stable, chemistries are normal. -The pt has no prohibitive toxicities from continuing C3 R-mini-CHOP with G-CSF support at this time. -Will repeat PET/CT at the conclusion of C3 to evaluate treatment efficacy and possible indication to increase dose if necessary. -Recommend holding off for 6 months post treatment for routine Tetanus booster to preserve efficacy, if possible. No live virus vaccines. -Hold off on routine dental cleaning until after completing treatment -Recommend Tylenol and Claritin for mild Neulasta related bone pains -Pt will be able to receive the rapid protocol of Rituxan with C2 onwards -Discussed the balance between using the most effective dose with treatment tolerance. -Recommend R-mini-CHOP up to 6 cycles with G-CSF support and possibly consolidative RT. -continue same nausea and other supportive medications. -Discussed the characteristics of DLBCL which increase risk of brain recurrence. Pt is not high risk category, but is intermediate risk given bowel involvement. At pt's age, I do not recommend prophylactic IT MTX with concerns for tolerance and potentially causing delays in primary induction treatment. -Advised infection prevention strategies, crowd avoidance, and frequent hand washing -Recommend Miralax, and Senna S at night time -Will see the pt back in  3 weeks   PET/CT in 2 weeks F/u as per scheduled appointments for C4  of R-CHOP, labs and MD visit in 3weeks   All of the patients questions were answered with apparent satisfaction. The patient knows to call the clinic with any problems, questions or concerns.  The total time spent in the appt was 25 minutes and more than 50% was on counseling and direct patient cares.    Sullivan Lone MD MS AAHIVMS [facility] Hematology/Oncology Physician [facility]  (Office):       [phone] (Work cell):  [phone] (Fax):           [phone]  09/16/2018 12:06 PM  I, Baldwin Jamaica, am acting as a scribe for Dr. Sullivan Lone.   .I have reviewed the above documentation for accuracy and completeness, and I agree with the above. Brunetta Genera MD

## 2018-09-16 ENCOUNTER — Inpatient Hospital Stay (HOSPITAL_BASED_OUTPATIENT_CLINIC_OR_DEPARTMENT_OTHER): Payer: Medicare Other | Admitting: Hematology

## 2018-09-16 ENCOUNTER — Other Ambulatory Visit: Payer: Self-pay

## 2018-09-16 ENCOUNTER — Inpatient Hospital Stay: Payer: Medicare Other

## 2018-09-16 ENCOUNTER — Telehealth: Payer: Self-pay | Admitting: Hematology

## 2018-09-16 VITALS — BP 126/76 | HR 63 | Temp 98.7°F | Resp 17 | Ht 69.0 in | Wt 162.9 lb

## 2018-09-16 VITALS — BP 111/61 | HR 59 | Temp 98.3°F | Resp 18

## 2018-09-16 DIAGNOSIS — Z8546 Personal history of malignant neoplasm of prostate: Secondary | ICD-10-CM

## 2018-09-16 DIAGNOSIS — I10 Essential (primary) hypertension: Secondary | ICD-10-CM | POA: Diagnosis not present

## 2018-09-16 DIAGNOSIS — Z79899 Other long term (current) drug therapy: Secondary | ICD-10-CM | POA: Diagnosis not present

## 2018-09-16 DIAGNOSIS — Z95828 Presence of other vascular implants and grafts: Secondary | ICD-10-CM | POA: Insufficient documentation

## 2018-09-16 DIAGNOSIS — Z9079 Acquired absence of other genital organ(s): Secondary | ICD-10-CM | POA: Diagnosis not present

## 2018-09-16 DIAGNOSIS — C8331 Diffuse large B-cell lymphoma, lymph nodes of head, face, and neck: Secondary | ICD-10-CM

## 2018-09-16 DIAGNOSIS — Z5111 Encounter for antineoplastic chemotherapy: Secondary | ICD-10-CM

## 2018-09-16 DIAGNOSIS — Z87891 Personal history of nicotine dependence: Secondary | ICD-10-CM

## 2018-09-16 DIAGNOSIS — Z5112 Encounter for antineoplastic immunotherapy: Secondary | ICD-10-CM | POA: Diagnosis not present

## 2018-09-16 DIAGNOSIS — Z5189 Encounter for other specified aftercare: Secondary | ICD-10-CM | POA: Diagnosis not present

## 2018-09-16 LAB — CMP (CANCER CENTER ONLY)
ALT: 16 U/L (ref 0–44)
AST: 18 U/L (ref 15–41)
Albumin: 4 g/dL (ref 3.5–5.0)
Alkaline Phosphatase: 76 U/L (ref 38–126)
Anion gap: 6 (ref 5–15)
BUN: 20 mg/dL (ref 8–23)
CO2: 28 mmol/L (ref 22–32)
Calcium: 8.9 mg/dL (ref 8.9–10.3)
Chloride: 105 mmol/L (ref 98–111)
Creatinine: 0.96 mg/dL (ref 0.61–1.24)
GFR, Est AFR Am: >60 mL/min (ref >60)
GFR, Estimated: >60 mL/min (ref >60)
Glucose, Bld: 75 mg/dL (ref 70–99)
Potassium: 3.9 mmol/L (ref 3.5–5.1)
Sodium: 139 mmol/L (ref 135–145)
Total Bilirubin: 0.5 mg/dL (ref 0.3–1.2)
Total Protein: 7 g/dL (ref 6.5–8.1)

## 2018-09-16 LAB — CBC WITH DIFFERENTIAL (CANCER CENTER ONLY)
Abs Immature Granulocytes: 0.05 10*3/uL (ref 0.00–0.07)
Basophils Absolute: 0 10*3/uL (ref 0.0–0.1)
Basophils Relative: 0 %
Eosinophils Absolute: 0 10*3/uL (ref 0.0–0.5)
Eosinophils Relative: 0 %
HCT: 38.8 % — ABNORMAL LOW (ref 39.0–52.0)
Hemoglobin: 12.4 g/dL — ABNORMAL LOW (ref 13.0–17.0)
Immature Granulocytes: 1 %
Lymphocytes Relative: 25 %
Lymphs Abs: 1.7 10*3/uL (ref 0.7–4.0)
MCH: 29.1 pg (ref 26.0–34.0)
MCHC: 32 g/dL (ref 30.0–36.0)
MCV: 91.1 fL (ref 80.0–100.0)
Monocytes Absolute: 1.2 10*3/uL — ABNORMAL HIGH (ref 0.1–1.0)
Monocytes Relative: 18 %
Neutro Abs: 3.8 10*3/uL (ref 1.7–7.7)
Neutrophils Relative %: 56 %
Platelet Count: 134 10*3/uL — ABNORMAL LOW (ref 150–400)
RBC: 4.26 MIL/uL (ref 4.22–5.81)
RDW: 15.1 % (ref 11.5–15.5)
WBC Count: 6.8 10*3/uL (ref 4.0–10.5)
nRBC: 0 % (ref 0.0–0.2)

## 2018-09-16 MED ORDER — DIPHENHYDRAMINE HCL 25 MG PO CAPS
25.0000 mg | ORAL_CAPSULE | Freq: Once | ORAL | Status: AC
  Administered 2018-09-16: 25 mg via ORAL

## 2018-09-16 MED ORDER — SODIUM CHLORIDE 0.9 % IV SOLN
Freq: Once | INTRAVENOUS | Status: AC
  Administered 2018-09-16: 13:00:00 via INTRAVENOUS
  Filled 2018-09-16: qty 250

## 2018-09-16 MED ORDER — FAMOTIDINE IN NACL 20-0.9 MG/50ML-% IV SOLN
INTRAVENOUS | Status: AC
  Filled 2018-09-16: qty 50

## 2018-09-16 MED ORDER — DEXAMETHASONE SODIUM PHOSPHATE 10 MG/ML IJ SOLN
INTRAMUSCULAR | Status: AC
  Filled 2018-09-16: qty 1

## 2018-09-16 MED ORDER — SODIUM CHLORIDE 0.9% FLUSH
10.0000 mL | INTRAVENOUS | Status: DC | PRN
  Administered 2018-09-16: 11:00:00 10 mL
  Filled 2018-09-16: qty 10

## 2018-09-16 MED ORDER — ACETAMINOPHEN 325 MG PO TABS
ORAL_TABLET | ORAL | Status: AC
  Filled 2018-09-16: qty 2

## 2018-09-16 MED ORDER — SODIUM CHLORIDE 0.9% FLUSH
10.0000 mL | INTRAVENOUS | Status: DC | PRN
  Administered 2018-09-16: 10 mL
  Filled 2018-09-16: qty 10

## 2018-09-16 MED ORDER — SODIUM CHLORIDE 0.9 % IV SOLN
375.0000 mg/m2 | Freq: Once | INTRAVENOUS | Status: AC
  Administered 2018-09-16: 700 mg via INTRAVENOUS
  Filled 2018-09-16: qty 50

## 2018-09-16 MED ORDER — DIPHENHYDRAMINE HCL 25 MG PO CAPS
ORAL_CAPSULE | ORAL | Status: AC
  Filled 2018-09-16: qty 1

## 2018-09-16 MED ORDER — DEXAMETHASONE SODIUM PHOSPHATE 10 MG/ML IJ SOLN
10.0000 mg | Freq: Once | INTRAMUSCULAR | Status: AC
  Administered 2018-09-16: 10 mg via INTRAVENOUS

## 2018-09-16 MED ORDER — PALONOSETRON HCL INJECTION 0.25 MG/5ML
0.2500 mg | Freq: Once | INTRAVENOUS | Status: AC
  Administered 2018-09-16: 0.25 mg via INTRAVENOUS

## 2018-09-16 MED ORDER — VINCRISTINE SULFATE CHEMO INJECTION 1 MG/ML
1.0000 mg | Freq: Once | INTRAVENOUS | Status: AC
  Administered 2018-09-16: 1 mg via INTRAVENOUS
  Filled 2018-09-16: qty 1

## 2018-09-16 MED ORDER — FAMOTIDINE IN NACL 20-0.9 MG/50ML-% IV SOLN
20.0000 mg | Freq: Once | INTRAVENOUS | Status: AC
  Administered 2018-09-16: 20 mg via INTRAVENOUS

## 2018-09-16 MED ORDER — PALONOSETRON HCL INJECTION 0.25 MG/5ML
INTRAVENOUS | Status: AC
  Filled 2018-09-16: qty 5

## 2018-09-16 MED ORDER — DOXORUBICIN HCL CHEMO IV INJECTION 2 MG/ML
25.0000 mg/m2 | Freq: Once | INTRAVENOUS | Status: AC
  Administered 2018-09-16: 48 mg via INTRAVENOUS
  Filled 2018-09-16: qty 24

## 2018-09-16 MED ORDER — SODIUM CHLORIDE 0.9 % IV SOLN
400.0000 mg/m2 | Freq: Once | INTRAVENOUS | Status: AC
  Administered 2018-09-16: 780 mg via INTRAVENOUS
  Filled 2018-09-16: qty 39

## 2018-09-16 MED ORDER — HEPARIN SOD (PORK) LOCK FLUSH 100 UNIT/ML IV SOLN
500.0000 [IU] | Freq: Once | INTRAVENOUS | Status: AC | PRN
  Administered 2018-09-16: 500 [IU]
  Filled 2018-09-16: qty 5

## 2018-09-16 MED ORDER — ACETAMINOPHEN 325 MG PO TABS
650.0000 mg | ORAL_TABLET | Freq: Once | ORAL | Status: AC
  Administered 2018-09-16: 13:00:00 650 mg via ORAL

## 2018-09-16 NOTE — Telephone Encounter (Signed)
Per 6/22 los lab,md,and treatment already scheduled.

## 2018-09-18 ENCOUNTER — Other Ambulatory Visit: Payer: Self-pay

## 2018-09-18 ENCOUNTER — Inpatient Hospital Stay: Payer: Medicare Other

## 2018-09-18 VITALS — BP 112/62 | HR 59 | Temp 98.2°F | Resp 18

## 2018-09-18 DIAGNOSIS — Z5189 Encounter for other specified aftercare: Secondary | ICD-10-CM | POA: Diagnosis not present

## 2018-09-18 DIAGNOSIS — C8331 Diffuse large B-cell lymphoma, lymph nodes of head, face, and neck: Secondary | ICD-10-CM

## 2018-09-18 DIAGNOSIS — I10 Essential (primary) hypertension: Secondary | ICD-10-CM | POA: Diagnosis not present

## 2018-09-18 DIAGNOSIS — Z5112 Encounter for antineoplastic immunotherapy: Secondary | ICD-10-CM | POA: Diagnosis not present

## 2018-09-18 DIAGNOSIS — Z87891 Personal history of nicotine dependence: Secondary | ICD-10-CM | POA: Diagnosis not present

## 2018-09-18 DIAGNOSIS — Z5111 Encounter for antineoplastic chemotherapy: Secondary | ICD-10-CM | POA: Diagnosis not present

## 2018-09-18 MED ORDER — PEGFILGRASTIM-CBQV 6 MG/0.6ML ~~LOC~~ SOSY
PREFILLED_SYRINGE | SUBCUTANEOUS | Status: AC
  Filled 2018-09-18: qty 0.6

## 2018-09-18 MED ORDER — PEGFILGRASTIM-CBQV 6 MG/0.6ML ~~LOC~~ SOSY
6.0000 mg | PREFILLED_SYRINGE | Freq: Once | SUBCUTANEOUS | Status: AC
  Administered 2018-09-18: 6 mg via SUBCUTANEOUS

## 2018-09-24 ENCOUNTER — Telehealth: Payer: Self-pay | Admitting: *Deleted

## 2018-09-24 NOTE — Telephone Encounter (Signed)
Called office - they haven't called about his scan/PET. Gave him number for [employer].

## 2018-10-03 ENCOUNTER — Ambulatory Visit (HOSPITAL_COMMUNITY)
Admission: RE | Admit: 2018-10-03 | Discharge: 2018-10-03 | Disposition: A | Discharge status: Home / Self Care | Payer: Medicare Other | Source: Ambulatory Visit | Attending: Hematology | Admitting: Hematology

## 2018-10-03 ENCOUNTER — Other Ambulatory Visit: Payer: Self-pay

## 2018-10-03 DIAGNOSIS — I10 Essential (primary) hypertension: Secondary | ICD-10-CM | POA: Insufficient documentation

## 2018-10-03 DIAGNOSIS — Z79899 Other long term (current) drug therapy: Secondary | ICD-10-CM | POA: Diagnosis not present

## 2018-10-03 DIAGNOSIS — Z8546 Personal history of malignant neoplasm of prostate: Secondary | ICD-10-CM | POA: Insufficient documentation

## 2018-10-03 DIAGNOSIS — Z87891 Personal history of nicotine dependence: Secondary | ICD-10-CM | POA: Insufficient documentation

## 2018-10-03 DIAGNOSIS — C8331 Diffuse large B-cell lymphoma, lymph nodes of head, face, and neck: Secondary | ICD-10-CM | POA: Insufficient documentation

## 2018-10-03 DIAGNOSIS — R918 Other nonspecific abnormal finding of lung field: Secondary | ICD-10-CM | POA: Insufficient documentation

## 2018-10-03 DIAGNOSIS — C8330 Diffuse large B-cell lymphoma, unspecified site: Secondary | ICD-10-CM | POA: Diagnosis not present

## 2018-10-03 LAB — GLUCOSE, CAPILLARY: Glucose-Capillary: 99 mg/dL (ref 70–99)

## 2018-10-03 MED ORDER — FLUDEOXYGLUCOSE F - 18 (FDG) INJECTION
8.5500 | Freq: Once | INTRAVENOUS | Status: AC
  Administered 2018-10-03: 8.55 via INTRAVENOUS

## 2018-10-07 ENCOUNTER — Inpatient Hospital Stay: Payer: Medicare Other

## 2018-10-07 ENCOUNTER — Inpatient Hospital Stay: Payer: Medicare Other | Attending: Hematology

## 2018-10-07 ENCOUNTER — Other Ambulatory Visit: Payer: Self-pay

## 2018-10-07 ENCOUNTER — Inpatient Hospital Stay (HOSPITAL_BASED_OUTPATIENT_CLINIC_OR_DEPARTMENT_OTHER): Payer: Medicare Other | Admitting: Hematology

## 2018-10-07 ENCOUNTER — Telehealth: Payer: Self-pay | Admitting: Hematology

## 2018-10-07 VITALS — BP 140/71 | HR 55 | Temp 98.5°F | Resp 18 | Ht 69.0 in | Wt 160.5 lb

## 2018-10-07 VITALS — BP 111/54 | HR 67 | Temp 98.3°F | Resp 20

## 2018-10-07 DIAGNOSIS — Z8546 Personal history of malignant neoplasm of prostate: Secondary | ICD-10-CM | POA: Diagnosis not present

## 2018-10-07 DIAGNOSIS — C8331 Diffuse large B-cell lymphoma, lymph nodes of head, face, and neck: Secondary | ICD-10-CM

## 2018-10-07 DIAGNOSIS — Z5111 Encounter for antineoplastic chemotherapy: Secondary | ICD-10-CM | POA: Diagnosis not present

## 2018-10-07 DIAGNOSIS — Z95828 Presence of other vascular implants and grafts: Secondary | ICD-10-CM

## 2018-10-07 DIAGNOSIS — Z79899 Other long term (current) drug therapy: Secondary | ICD-10-CM | POA: Diagnosis not present

## 2018-10-07 LAB — LACTATE DEHYDROGENASE: LDH: 226 U/L — ABNORMAL HIGH (ref 98–192)

## 2018-10-07 LAB — CMP (CANCER CENTER ONLY)
ALT: 13 U/L (ref 0–44)
AST: 19 U/L (ref 15–41)
Albumin: 4 g/dL (ref 3.5–5.0)
Alkaline Phosphatase: 73 U/L (ref 38–126)
Anion gap: 9 (ref 5–15)
BUN: 16 mg/dL (ref 8–23)
CO2: 27 mmol/L (ref 22–32)
Calcium: 8.6 mg/dL — ABNORMAL LOW (ref 8.9–10.3)
Chloride: 105 mmol/L (ref 98–111)
Creatinine: 0.79 mg/dL (ref 0.61–1.24)
GFR, Est AFR Am: >60 mL/min (ref >60)
GFR, Estimated: >60 mL/min (ref >60)
Glucose, Bld: 95 mg/dL (ref 70–99)
Potassium: 3.9 mmol/L (ref 3.5–5.1)
Sodium: 141 mmol/L (ref 135–145)
Total Bilirubin: 0.5 mg/dL (ref 0.3–1.2)
Total Protein: 7 g/dL (ref 6.5–8.1)

## 2018-10-07 LAB — CBC WITH DIFFERENTIAL/PLATELET
Abs Immature Granulocytes: 0.04 10*3/uL (ref 0.00–0.07)
Basophils Absolute: 0 10*3/uL (ref 0.0–0.1)
Basophils Relative: 0 %
Eosinophils Absolute: 0 10*3/uL (ref 0.0–0.5)
Eosinophils Relative: 0 %
HCT: 39.9 % (ref 39.0–52.0)
Hemoglobin: 12.8 g/dL — ABNORMAL LOW (ref 13.0–17.0)
Immature Granulocytes: 1 %
Lymphocytes Relative: 27 %
Lymphs Abs: 1.7 10*3/uL (ref 0.7–4.0)
MCH: 29.8 pg (ref 26.0–34.0)
MCHC: 32.1 g/dL (ref 30.0–36.0)
MCV: 92.8 fL (ref 80.0–100.0)
Monocytes Absolute: 1 10*3/uL (ref 0.1–1.0)
Monocytes Relative: 16 %
Neutro Abs: 3.5 10*3/uL (ref 1.7–7.7)
Neutrophils Relative %: 56 %
Platelets: 141 10*3/uL — ABNORMAL LOW (ref 150–400)
RBC: 4.3 MIL/uL (ref 4.22–5.81)
RDW: 15.2 % (ref 11.5–15.5)
WBC: 6.3 10*3/uL (ref 4.0–10.5)
nRBC: 0 % (ref 0.0–0.2)

## 2018-10-07 MED ORDER — ACETAMINOPHEN 325 MG PO TABS
ORAL_TABLET | ORAL | Status: AC
  Filled 2018-10-07: qty 2

## 2018-10-07 MED ORDER — SODIUM CHLORIDE 0.9 % IV SOLN
400.0000 mg/m2 | Freq: Once | INTRAVENOUS | Status: AC
  Administered 2018-10-07: 780 mg via INTRAVENOUS
  Filled 2018-10-07: qty 39

## 2018-10-07 MED ORDER — DIPHENHYDRAMINE HCL 25 MG PO CAPS
ORAL_CAPSULE | ORAL | Status: AC
  Filled 2018-10-07: qty 1

## 2018-10-07 MED ORDER — ACETAMINOPHEN 325 MG PO TABS
650.0000 mg | ORAL_TABLET | Freq: Once | ORAL | Status: AC
  Administered 2018-10-07: 650 mg via ORAL

## 2018-10-07 MED ORDER — SODIUM CHLORIDE 0.9 % IV SOLN
375.0000 mg/m2 | Freq: Once | INTRAVENOUS | Status: AC
  Administered 2018-10-07: 700 mg via INTRAVENOUS
  Filled 2018-10-07: qty 50

## 2018-10-07 MED ORDER — SODIUM CHLORIDE 0.9% FLUSH
10.0000 mL | INTRAVENOUS | Status: DC | PRN
  Administered 2018-10-07: 10 mL
  Filled 2018-10-07: qty 10

## 2018-10-07 MED ORDER — SODIUM CHLORIDE 0.9 % IV SOLN
Freq: Once | INTRAVENOUS | Status: AC
  Administered 2018-10-07: 11:00:00 via INTRAVENOUS
  Filled 2018-10-07: qty 250

## 2018-10-07 MED ORDER — VINCRISTINE SULFATE CHEMO INJECTION 1 MG/ML
1.0000 mg | Freq: Once | INTRAVENOUS | Status: AC
  Administered 2018-10-07: 1 mg via INTRAVENOUS
  Filled 2018-10-07: qty 1

## 2018-10-07 MED ORDER — DOXORUBICIN HCL CHEMO IV INJECTION 2 MG/ML
37.5000 mg/m2 | Freq: Once | INTRAVENOUS | Status: AC
  Administered 2018-10-07: 72 mg via INTRAVENOUS
  Filled 2018-10-07: qty 36

## 2018-10-07 MED ORDER — DEXAMETHASONE SODIUM PHOSPHATE 10 MG/ML IJ SOLN
10.0000 mg | Freq: Once | INTRAMUSCULAR | Status: AC
  Administered 2018-10-07: 10 mg via INTRAVENOUS

## 2018-10-07 MED ORDER — FAMOTIDINE IN NACL 20-0.9 MG/50ML-% IV SOLN
INTRAVENOUS | Status: AC
  Filled 2018-10-07: qty 50

## 2018-10-07 MED ORDER — PALONOSETRON HCL INJECTION 0.25 MG/5ML
INTRAVENOUS | Status: AC
  Filled 2018-10-07: qty 5

## 2018-10-07 MED ORDER — FAMOTIDINE IN NACL 20-0.9 MG/50ML-% IV SOLN
20.0000 mg | Freq: Once | INTRAVENOUS | Status: AC
  Administered 2018-10-07: 20 mg via INTRAVENOUS

## 2018-10-07 MED ORDER — PALONOSETRON HCL INJECTION 0.25 MG/5ML
0.2500 mg | Freq: Once | INTRAVENOUS | Status: AC
  Administered 2018-10-07: 0.25 mg via INTRAVENOUS

## 2018-10-07 MED ORDER — DIPHENHYDRAMINE HCL 25 MG PO CAPS
25.0000 mg | ORAL_CAPSULE | Freq: Once | ORAL | Status: AC
  Administered 2018-10-07: 25 mg via ORAL

## 2018-10-07 MED ORDER — DEXAMETHASONE SODIUM PHOSPHATE 10 MG/ML IJ SOLN
INTRAMUSCULAR | Status: AC
  Filled 2018-10-07: qty 1

## 2018-10-07 MED ORDER — HEPARIN SOD (PORK) LOCK FLUSH 100 UNIT/ML IV SOLN
500.0000 [IU] | Freq: Once | INTRAVENOUS | Status: AC | PRN
  Administered 2018-10-07: 500 [IU]
  Filled 2018-10-07: qty 5

## 2018-10-07 NOTE — Telephone Encounter (Signed)
Scheduled appt per 7/13 los. °

## 2018-10-07 NOTE — Progress Notes (Signed)
HEMATOLOGY/ONCOLOGY CLINIC NOTE  Date of Service: 10/07/2018  Patient Care Team: Denita Lung, MD as PCP - General (Family Medicine)  CHIEF COMPLAINTS/PURPOSE OF CONSULTATION:  Diffuse Large B-Cell Lymphoma  HISTORY OF PRESENTING ILLNESS:   Mario Garcia is a wonderful 85 y.o. male who has been referred to [place] by Dr. Jill Alexanders for evaluation and management of Diffuse Large B-Cell Lymphoma. The pt reports that he is doing well overall.   The pt reports that he feels that he has been a little more tired recently. He first noticed some "nodules" on his neck appear "4-6 weeks ago." He notes that these nodules haven't been painful. He appears to have presented to care with his PCP on 05/28/18, Dr. Redmond School. He notes that he has noticed that he has been sweating more in the last year. He denies noticing any other new lumps or bumps. He denies any fevers, chills, drenching night sweats, or unexpected weight loss. He notes that he has had some changes in swallowing over the last 6 months, which he characterizes as "getting strangled on his saliva every now and then." He denies abdominal pains, acid reflux, changes in bowel habits, leg swelling, skin rashes. He endorses a deep pain "every once in a while," in his left groin. He denies headaches. He endorses glaucoma and a history of cataracts. He sees Dr. Hetty Blend in ophthalmology. He denies any other concern in the last 6 months.  The pt notes that he lives independently with his wife and still is able to do all he wants to do. He walks on trails with his wife and notes that he can generally walk as far as he likes to.  The pt notes that he had prostate cancer in 2008 or 2009, s/p prostatectomy. He did not require RT nor systemic treatments. He denies lung, heart or kidney problems.  Of note prior to the patient's visit today, pt has had a CT Neck completed on 06/05/18 with results revealing Pathologic RIGHT-sided level II, III, IV, and V  lymphadenopathy, most consistent with metastatic carcinoma. An obvious primary source is not identified. Tissue sampling is warranted.  Most recent lab results (07/08/18) of CBC and CMP is as follows: all values are WNL except for PLT at 129k.  On review of systems, pt reports slightly more tired, neck nodules, some changes in swallowing, staying active, and denies fevers, chills, drenching night sweats, unexpected weight loss, abdominal pains, changes in bowel habits, skin rashes, leg swelling, CP, SOB, difficulty breathing, headaches, other lumps or bumps, skin lesions, mouth sores, pain along the spine, back pain, leg swelling, and any other symptoms.   On PMHx the pt reports localized Prostate cancer in 2008 s/p prostatectomy.  On Social Hx the pt reports that he quit smoking over 50 years ago. He notes that he consumes about 1 glass of wine every day, without concerns for excessive drinking. 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 He denies concern for chemical or radiation exposure. On Family Hx the pt reports wife with Stage IV Non Hodgkin's Lymphoma, paternal grandmother with leukemia in her 58s, father with unspecified abdominal cancer.   Interval History:  Mario Garcia returns today for management and evaluation of his newly diagnosed Diffuse Large B-Cell Lymphoma. The patient's last visit with [place] was on 09/16/2018. The pt reports that he is doing well overall.  The pt reports that he has had a good appetite. He is due  to see his opthalmologist soon for a routine exam; he notes that he had cataracts removed 6 months ago.  He continues with his fourth dose of mini R-CHOP chemotherapy today, 10/07/18; The pt has no prohibitive toxicities from continuing this treatment at this time.   Of note since the patient's last visit, pt has had a PET scan completed on 10/03/2018 with results revealing Interval response to therapy with stable to decreased size of lymph  nodes on CT and generalized decrease in hypermetabolism of the abnormal nodes. Hypermetabolism in the lymph nodes today is compatible with a combination of Deauville 3 and Deauville 4 disease. Stable tiny bilateral pulmonary nodules. Increase radiotracer accumulation in the marrow space on today's study, presumably representing marrow stimulatory effects of Therapy. Aortic Atherosclerois (ICD10-170.0).  Lab results today (10/07/18) of CBC w/diff and CMP is as follows: all values are WNL except for hemoglobin 12.8, platelets 141, LDH 226, Calcium 8.6.   On review of systems, pt reports positive appetite and denies any other symptoms.    MEDICAL HISTORY:  Past Medical History:  Diagnosis Date  . Cancer ([place])    PROSTATE  . History of colonic polyps   . History of kidney stones   . Hypertension   . Inguinal hernia 03/2002  . Pneumonia    "years ago"    SURGICAL HISTORY: Past Surgical History:  Procedure Laterality Date  . COLONOSCOPY    . DIRECT LARYNGOSCOPY N/A 07/08/2018   Procedure: DIRECT LARYNGOSCOPY;  Surgeon: Leta Baptist, MD;  Location: [facility];  Service: ENT;  Laterality: N/A;  . ESOPHAGOSCOPY N/A 07/08/2018   Procedure: ESOPHAGOSCOPY;  Surgeon: Leta Baptist, MD;  Location: [facility];  Service: ENT;  Laterality: N/A;  . FLEXIBLE BRONCHOSCOPY N/A 07/08/2018   Procedure: FLEXIBLE BRONCHOSCOPY;  Surgeon: Leta Baptist, MD;  Location: [facility];  Service: ENT;  Laterality: N/A;  . HERNIA REPAIR Left    inguinal hernia  . IR IMAGING GUIDED PORT INSERTION  07/30/2018  . MASS BIOPSY Right 07/08/2018   Procedure: RIGHT NECK MASS EXCISIONAL BIOPSY;  Surgeon: Leta Baptist, MD;  Location: [facility];  Service: ENT;  Laterality: Right;  . PATELLA FRACTURE SURGERY Left   . PROSTATE SURGERY  10/2005   RADIAL PROSTATECTOMY  . ROTATOR CUFF REPAIR Bilateral     SOCIAL HISTORY: Social History   Socioeconomic History  . Marital status: Married    Spouse name: Not on file  . Number of children: Not on file  . Years of  education: Not on file  . Highest education level: Not on file  Occupational History  . Not on file  Social Needs  . Financial resource strain: Not on file  . Food insecurity    Worry: Not on file    Inability: Not on file  . Transportation needs    Medical: Not on file    Non-medical: Not on file  Tobacco Use  . Smoking status: Former Research scientist (life sciences)  . Smokeless tobacco: Never Used  . Tobacco comment: stopped 60 years ago  Substance and Sexual Activity  . Alcohol use: Yes    Alcohol/week: 6.0 standard drinks    Types: 6 Standard drinks or equivalent per week  . Drug use: No  . Sexual activity: Not Currently  Lifestyle  . Physical activity    Days per week: Not on file    Minutes per session: Not on file  . Stress: Not on file  Relationships  . Social Herbalist on phone: Not  on file    Gets together: Not on file    Attends religious service: Not on file    Active member of club or organization: Not on file    Attends meetings of clubs or organizations: Not on file    Relationship status: Not on file  . Intimate partner violence    Fear of current or ex partner: Not on file    Emotionally abused: Not on file    Physically abused: Not on file    Forced sexual activity: Not on file  Other Topics Concern  . Not on file  Social History Narrative  . Not on file    FAMILY HISTORY: Family History  Problem Relation Age of Onset  . Cancer Father   . Cancer Brother     ALLERGIES:  is allergic to lisinopril and penicillins.  MEDICATIONS:  Current Outpatient Medications  Medication Sig Dispense Refill  . allopurinol (ZYLOPRIM) 300 MG tablet Take 0.5 tablets (150 mg total) by mouth daily. 30 tablet 0  . Carboxymethylcellulose Sodium (ARTIFICIAL TEARS OP) Place 1 drop into both eyes daily as needed (dry eyes).    . dorzolamide (TRUSOPT) 2 % ophthalmic solution Place 1 drop into both eyes 2 (two) times daily.    Marland Kitchen latanoprost (XALATAN) 0.005 % ophthalmic solution Place  1 drop into both eyes at bedtime.    . lidocaine-prilocaine (EMLA) cream Apply to affected area once 30 g 3  . losartan-hydrochlorothiazide (HYZAAR) 50-12.5 MG tablet Take 1 tablet by mouth daily. 90 tablet 3  . ondansetron (ZOFRAN) 8 MG tablet Take 1 tablet (8 mg total) by mouth 2 (two) times daily as needed for refractory nausea / vomiting. Start on day 3 after cyclophosphamide chemotherapy. 30 tablet 1  . oxyCODONE-acetaminophen (PERCOCET) 5-325 MG tablet Take 1 tablet by mouth every 4 (four) hours as needed for severe pain. 20 tablet 0  . pravastatin (PRAVACHOL) 40 MG tablet Take 1 tablet (40 mg total) by mouth daily. 90 tablet 2  . predniSONE (DELTASONE) 20 MG tablet Take 3 tablets (60 mg total) by mouth daily. Take on days 1-5 of chemotherapy. 15 tablet 6  . prochlorperazine (COMPAZINE) 10 MG tablet Take 1 tablet (10 mg total) by mouth every 6 (six) hours as needed (Nausea or vomiting). (Patient not taking: Reported on 09/11/2018) 30 tablet 6   No current facility-administered medications for this visit.    Facility-Administered Medications Ordered in Other Visits  Medication Dose Route Frequency Provider Last Rate Last Dose  . acetaminophen (TYLENOL) tablet 650 mg  650 mg Oral Once Brunetta Genera, MD      . cyclophosphamide (CYTOXAN) 780 mg in sodium chloride 0.9 % 250 mL chemo infusion  400 mg/m2 (Treatment Plan Recorded) Intravenous Once Brunetta Genera, MD      . dexamethasone (DECADRON) injection 10 mg  10 mg Intravenous Once Brunetta Genera, MD      . diphenhydrAMINE (BENADRYL) capsule 25 mg  25 mg Oral Once Brunetta Genera, MD      . DOXOrubicin (ADRIAMYCIN) chemo injection 72 mg  37.5 mg/m2 (Treatment Plan Recorded) Intravenous Once Brunetta Genera, MD      . famotidine (PEPCID) IVPB 20 mg premix  20 mg Intravenous Once Brunetta Genera, MD      . heparin lock flush 100 unit/mL  500 Units Intracatheter Once PRN Brunetta Genera, MD      .  palonosetron (ALOXI) injection 0.25 mg  0.25 mg Intravenous Once [place], Caylen Yardley  Vivien Rossetti, MD      . riTUXimab (RITUXAN) 700 mg in sodium chloride 0.9 % 180 mL infusion  375 mg/m2 (Treatment Plan Recorded) Intravenous Once Brunetta Genera, MD      . sodium chloride flush (NS) 0.9 % injection 10 mL  10 mL Intracatheter PRN Brunetta Genera, MD      . vinCRIStine (ONCOVIN) 1 mg in sodium chloride 0.9 % 50 mL chemo infusion  1 mg Intravenous Once Brunetta Genera, MD        REVIEW OF SYSTEMS:   A 10+ POINT REVIEW OF SYSTEMS WAS OBTAINED including neurology, dermatology, psychiatry, cardiac, respiratory, lymph, extremities, GI, GU, Musculoskeletal, constitutional, breasts, reproductive, HEENT.  All pertinent positives are noted in the HPI.  All others are negative.     PHYSICAL EXAMINATION: ECOG PERFORMANCE STATUS: 1 - Symptomatic but completely ambulatory  Vitals:   10/07/18 1006  BP: 140/71  Pulse: (!) 55  Resp: 18  Temp: 98.5 F (36.9 C)  SpO2: 99%   Filed Weights   10/07/18 1006  Weight: 160 lb 8 oz (72.8 kg)   .Body mass index is 23.7 kg/m.   GENERAL:alert, in no acute distress and comfortable SKIN: no acute rashes, no significant lesions EYES: conjunctiva are pink and non-injected, sclera anicteric OROPHARYNX: MMM, no exudates, no oropharyngeal erythema or ulceration NECK: supple, no JVD LYMPH:  no palpable lymphadenopathy in the cervical, axillary or inguinal regions LUNGS: clear to auscultation b/l with normal respiratory effort HEART: regular rate & rhythm ABDOMEN:  normoactive bowel sounds , non tender, not distended. Extremity: no pedal edema PSYCH: alert & oriented x 3 with fluent speech NEURO: no focal motor/sensory deficits   LABORATORY DATA:  I have reviewed the data as listed  . CBC Latest Ref Rng & Units 10/07/2018 09/16/2018 08/26/2018  WBC 4.0 - 10.5 K/uL 6.3 6.8 7.3  Hemoglobin 13.0 - 17.0 g/dL 12.8(L) 12.4(L) 12.4(L)  Hematocrit 39.0 - 52.0 %  39.9 38.8(L) 39.8  Platelets 150 - 400 K/uL 141(L) 134(L) 140(L)    . CMP Latest Ref Rng & Units 10/07/2018 09/16/2018 08/26/2018  Glucose 70 - 99 mg/dL 95 75 103(H)  BUN 8 - 23 mg/dL _0 Creatinine 0.61 - 1.24 mg/dL 0.79 0.96 0.82  Sodium 135 - 145 mmol/L 141 139 139  Potassium 3.5 - 5.1 mmol/L 3.9 3.9 3.7  Chloride 98 - 111 mmol/L 105 105 104  CO2 22 - 32 mmol/L _1 Calcium 8.9 - 10.3 mg/dL 8.6(L) 8.9 8.6(L)  Total Protein 6.5 - 8.1 g/dL 7.0 7.0 7.0  Total Bilirubin 0.3 - 1.2 mg/dL 0.5 0.5 0.5  Alkaline Phos 38 - 126 U/L 73 76 83  AST 15 - 41 U/L _2 ALT 0 - 44 U/L _3 . Lab Results  Component Value Date   LDH 226 (H) 10/07/2018    07/08/18 Right Cervical Tissue Biopsy:    RADIOGRAPHIC STUDIES: I have personally reviewed the radiological images as listed and agreed with the findings in the report. Nm Pet Image Restag (ps) Skull Base To Thigh  Result Date: 10/03/2018 CLINICAL DATA:  Subsequent treatment strategy for diffuse large B-cell lymphoma. EXAM: NUCLEAR MEDICINE PET SKULL BASE TO THIGH TECHNIQUE: 8.6 mCi F-18 FDG was injected intravenously. Full-ring PET imaging was performed from the skull base to thigh after the radiotracer. CT data was obtained and used for attenuation correction and anatomic localization. Fasting blood glucose: 99 mg/dl COMPARISON:  07/22/2018  FINDINGS: Mediastinal blood pool activity: SUV max  2.1 Liver activity: SUV max 3.3 NECK: Interval decrease in hypermetabolic cervical lymphadenopathy. Bilateral II, III, IV, and V hypermetabolic lymphadenopathy seen previously has markedly reduced in the interval. Index right level II hypermetabolic lymphadenopathy measured previously at 2.4 cm short axis ( SUV max = 12.3) is now 0.6 cm short axis and SUV max = 4.0. The right level V hypermetabolic adenopathy measured previously at 1.3 cm short axis with SUV max = 15.3 now measures 0.8 cm short axis with SUV max = 3.0. Index left level II node  with SUV max = 8.1 previously now demonstrates SUV max = 4.2. Similar marked reduction in hypermetabolism of the palatine tonsils. Incidental CT findings: none CHEST: Small hypermetabolic nodes are again identified in the mediastinum, hilar regions, and axillary regions. 6 mm short axis left axillary node demonstrates SUV max = 2.3 on today's study compared to 6.3 previously. Index lymph node in the left internal mammary changed demonstrated SUV max = 4.3 previously which compares to 1.7 on the current study. The index subcarinal node measured previously at 7 mm short axis is 5 mm short axis today (79/4) with SUV max = on today's study of 3.5 compared to 5.8 previously. 6 mm right upper lobe nodule (44/8) is stable. Peripheral 7 mm right lower lobe nodule (47/8) is unchanged. Subpleural 4 mm right lower lobe nodule on 51/8 is similar to prior. 9 mm left perifissural nodule on 40/8 is stable. Other scattered small bilateral pulmonary nodules are unchanged. No hypermetabolism in the tiny pulmonary nodules on today's study. Incidental CT findings: Heart size upper normal. Coronary artery calcification is evident. Atherosclerotic calcification is noted in the wall of the thoracic aorta. Left Port-A-Cath tip is positioned at the SVC/RA junction. ABDOMEN/PELVIS: Interval marked decrease and hypermetabolic splenic parenchyma with SUV max = 3.7 compared to 11.5 previously. No splenomegaly. Hypermetabolic lymphadenopathy again noted in the porta hepatis, retroperitoneal space, mesentery, and pelvic sidewalls. 9 mm short axis porta hepatis node demonstrates SUV max = 5.5 today compared to 9.3 previously. Index left pelvic sidewall node measuring 7 mm short axis today (177/4) is not substantially changed in size in the interval. SUV max = 4.7 today compared to 9.5 previously. Incidental CT findings: There is abdominal aortic atherosclerosis without aneurysm. Small umbilical hernia noted. SKELETON: Interval increase in diffuse  marrow uptake of radiotracer, presumably related to marrow stimulation. Incidental CT findings: No worrisome lytic or sclerotic osseous abnormality. IMPRESSION: 1. Interval response to therapy with stable to decreased size of lymph nodes on CT and generalized decrease in hypermetabolism of the abnormal nodes. Hypermetabolism in the lymph nodes today is compatible with a combination of Deauville 3 and Deauville 4 disease. 2. Stable tiny bilateral pulmonary nodules. 3. Increase radiotracer accumulation in the marrow space on today's study, presumably representing marrow stimulatory effects of therapy. 4.  Aortic Atherosclerois (ICD10-170.0) Electronically Signed   By: Misty Stanley M.D.   On: 10/03/2018 16:02    ASSESSMENT & PLAN:   85 y.o. male with  1. Diffuse Large B-Cell Lymphoma, Stage IV Presenting without constitutional symptoms. Palpable right cervical and supraclavicular lymphadenopathy.   07/08/18 Right cervical soft tissue biopsy revealed Diffuse Large B-Cell Lymphoma, germinal center type   06/05/18 CT Neck revealed Pathologic RIGHT-sided level II, III, IV, and V lymphadenopathy, most consistent with metastatic carcinoma. An obvious primary source is not identified. Tissue sampling is warranted.  07/16/18 Hep B and Hep C negative  07/17/18 ECHO revealed LV  EF of 60-65%  07/22/18 PET/CT revealed "Hypermetabolic adenopathy especially concentrated in the right neck but also in the left neck, chest, abdomen, and pelvis. The adenopathy is primarily Deauville 5 although some few scattered smaller lesions are Deauville 4. 2. Diffuse abnormal splenic activity, Deauville 5, without overt splenomegaly. 3. There is also hypermetabolic Deauville 5 activity in the mildly thickened distal appendix, and raising suspicion for involvement of the appendix. 4. Other imaging findings of potential clinical significance: Aortic Atherosclerosis. Coronary atherosclerosis."   PLAN: -Discussed pt labwork today,  10/07/18; all values are WNL except for hemoglobin 12.8, platelets 141, LDH 226, Calcium 8.6.  -Discussed PET scan completed on 10/03/2018 with results revealing Interval response to therapy with stable to decreased size of lymph nodes on CT and generalized decrease in hypermetabolism of the abnormal nodes. Hypermetabolism in the lymph nodes today is compatible with a combination of Deauville 3 and Deauville 4 disease. Stable tiny bilateral pulmonary nodules. Increase radiotracer accumulation in the marrow space on today's study, presumably representing marrow stimulatory effects oft herapy.  -Discussed and increased Doxorubicin to 37.5 mg/m2 from 25 mg/m2 to attempt complete remission. We discussed the risks associated with this, including lower blood counts and heart stress, which he is agreeable to.  -if tolerated we shall plan to increase Cytoxan from 400 mg/m to 537m/m2 with next cycle. -No prohibitive toxicity to preclude proceeding to cycle 4 of R mini CHOP today   F/u as per scheduled appointments on 8/3 and 8/5 for C5 of treatment Plz schedule C6 of treatment per orders with labs and MD visit   All of the patients questions were answered with apparent satisfaction. The patient knows to call the clinic with any problems, questions or concerns.  The total time spent in the appt was 25 minutes and more than 50% was on counseling and direct patient cares.     GSullivan LoneMD MS AAHIVMS SMountain Vista Medical Center, LPCPresence Chicago Hospitals Network Dba Presence Resurrection Medical CenterHematology/Oncology Physician CBayhealth [facility] General [facility] (Office):       [phone](Work cell):  [phone](Fax):           [phone] 10/07/2018 11:07 AM  I, AJacqualyn Posey am acting as a sEducation administratorfor Dr. GSullivan Lone   .I have reviewed the above documentation for accuracy and completeness, and I agree with the above. .Brunetta GeneraMD

## 2018-10-07 NOTE — Progress Notes (Signed)
Blood return noted before, during and after Adriamycin IV push.

## 2018-10-07 NOTE — Patient Instructions (Signed)
[facility] Discharge Instructions for Patients Receiving Chemotherapy  Today you received the following chemotherapy agents: Adriamycin, Vincristine, Cytoxan, Rituxan   To help prevent nausea and vomiting after your treatment, we encourage you to take your nausea medication as directed by your MD.   If you develop nausea and vomiting that is not controlled by your nausea medication, call the clinic.   BELOW ARE SYMPTOMS THAT SHOULD BE REPORTED IMMEDIATELY:  *FEVER GREATER THAN 100.5 F  *CHILLS WITH OR WITHOUT FEVER  NAUSEA AND VOMITING THAT IS NOT CONTROLLED WITH YOUR NAUSEA MEDICATION  *UNUSUAL SHORTNESS OF BREATH  *UNUSUAL BRUISING OR BLEEDING  TENDERNESS IN MOUTH AND THROAT WITH OR WITHOUT PRESENCE OF ULCERS  *URINARY PROBLEMS  *BOWEL PROBLEMS  UNUSUAL RASH Items with * indicate a potential emergency and should be followed up as soon as possible.  Feel free to call the clinic should you have any questions or concerns. The clinic phone number is [phone].  Please show the [facility] at check-in to the Emergency Department and triage nurse.  Coronavirus (COVID-19) Are you at risk?  Are you at risk for the Coronavirus (COVID-19)?  To be considered HIGH RISK for Coronavirus (COVID-19), you have to meet the following criteria:  . Traveled to [place], [place], [place], [place] or [place]; or in the [place] to [place], [place], [place], or [place]; and have fever, cough, and shortness of breath within the last 2 weeks of travel OR . Been in close contact with a person diagnosed with COVID-19 within the last 2 weeks and have fever, cough, and shortness of breath . IF YOU DO NOT MEET THESE CRITERIA, YOU ARE CONSIDERED LOW RISK FOR COVID-19.  What to do if you are HIGH RISK for COVID-19?  Marland Kitchen If you are having a medical emergency, call 911. . Seek medical care right away. Before you go to a doctor's office, urgent care or emergency  department, call ahead and tell them about your recent travel, contact with someone diagnosed with COVID-19, and your symptoms. You should receive instructions from your physician's office regarding next steps of care.  . When you arrive at healthcare provider, tell the healthcare staff immediately you have returned from visiting [place], [place], [place], [place] or [place]; or traveled in the [place] to [place], [place], [place], or [place]; in the last two weeks or you have been in close contact with a person diagnosed with COVID-19 in the last 2 weeks.   . Tell the health care staff about your symptoms: fever, cough and shortness of breath. . After you have been seen by a medical provider, you will be either: o Tested for (COVID-19) and discharged home on quarantine except to seek medical care if symptoms worsen, and asked to  - Stay home and avoid contact with others until you get your results (4-5 days)  - Avoid travel on public transportation if possible (such as bus, train, or airplane) or o Sent to the Emergency Department by EMS for evaluation, COVID-19 testing, and possible admission depending on your condition and test results.  What to do if you are LOW RISK for COVID-19?  Reduce your risk of any infection by using the same precautions used for avoiding the common cold or flu:  Marland Kitchen Wash your hands often with soap and warm water for at least 20 seconds.  If soap and water are not readily available, use an alcohol-based hand sanitizer with at least 60%  alcohol.  . If coughing or sneezing, cover your mouth and nose by coughing or sneezing into the elbow areas of your shirt or coat, into a tissue or into your sleeve (not your hands). . Avoid shaking hands with others and consider head nods or verbal greetings only. . Avoid touching your eyes, nose, or mouth with unwashed hands.  . Avoid close contact with people who are sick. . Avoid places or events with large numbers of people  in one location, like concerts or sporting events. . Carefully consider travel plans you have or are making. . If you are planning any travel outside or inside the [place], visit the CDC's Travelers' Health webpage for the latest health notices. . If you have some symptoms but not all symptoms, continue to monitor at home and seek medical attention if your symptoms worsen. . If you are having a medical emergency, call 911.   [place] / e-Visit: [email or web address]         [facility]: [facility]: [phone]                   [facility]: [phone]

## 2018-10-09 ENCOUNTER — Other Ambulatory Visit: Payer: Self-pay

## 2018-10-09 ENCOUNTER — Inpatient Hospital Stay: Payer: Medicare Other

## 2018-10-09 VITALS — BP 125/60 | HR 66 | Temp 98.1°F | Resp 18

## 2018-10-09 DIAGNOSIS — Z79899 Other long term (current) drug therapy: Secondary | ICD-10-CM | POA: Diagnosis not present

## 2018-10-09 DIAGNOSIS — C8331 Diffuse large B-cell lymphoma, lymph nodes of head, face, and neck: Secondary | ICD-10-CM | POA: Diagnosis not present

## 2018-10-09 DIAGNOSIS — H16223 Keratoconjunctivitis sicca, not specified as Sjogren's, bilateral: Secondary | ICD-10-CM | POA: Diagnosis not present

## 2018-10-09 DIAGNOSIS — H04123 Dry eye syndrome of bilateral lacrimal glands: Secondary | ICD-10-CM | POA: Diagnosis not present

## 2018-10-09 DIAGNOSIS — Z5111 Encounter for antineoplastic chemotherapy: Secondary | ICD-10-CM | POA: Diagnosis not present

## 2018-10-09 DIAGNOSIS — H401122 Primary open-angle glaucoma, left eye, moderate stage: Secondary | ICD-10-CM | POA: Diagnosis not present

## 2018-10-09 DIAGNOSIS — H40011 Open angle with borderline findings, low risk, right eye: Secondary | ICD-10-CM | POA: Diagnosis not present

## 2018-10-09 DIAGNOSIS — Z8546 Personal history of malignant neoplasm of prostate: Secondary | ICD-10-CM | POA: Diagnosis not present

## 2018-10-09 MED ORDER — PEGFILGRASTIM-CBQV 6 MG/0.6ML ~~LOC~~ SOSY
PREFILLED_SYRINGE | SUBCUTANEOUS | Status: AC
  Filled 2018-10-09: qty 0.6

## 2018-10-09 MED ORDER — PEGFILGRASTIM-CBQV 6 MG/0.6ML ~~LOC~~ SOSY
6.0000 mg | PREFILLED_SYRINGE | Freq: Once | SUBCUTANEOUS | Status: AC
  Administered 2018-10-09: 6 mg via SUBCUTANEOUS

## 2018-10-23 NOTE — Progress Notes (Signed)
HEMATOLOGY/ONCOLOGY CLINIC NOTE  Date of Service: 10/28/2018  Patient Care Team: Denita Lung, MD as PCP - General (Family Medicine)  CHIEF COMPLAINTS/PURPOSE OF CONSULTATION:  Diffuse Large B-Cell Lymphoma  HISTORY OF PRESENTING ILLNESS:   Mario Garcia is a wonderful 85 y.o. male who has been referred to [place] by Dr. Jill Alexanders for evaluation and management of Diffuse Large B-Cell Lymphoma. The pt reports that he is doing well overall.   The pt reports that he feels that he has been a little more tired recently. He first noticed some "nodules" on his neck appear "4-6 weeks ago." He notes that these nodules haven't been painful. He appears to have presented to care with his PCP on 05/28/18, Dr. Redmond School. He notes that he has noticed that he has been sweating more in the last year. He denies noticing any other new lumps or bumps. He denies any fevers, chills, drenching night sweats, or unexpected weight loss. He notes that he has had some changes in swallowing over the last 6 months, which he characterizes as "getting strangled on his saliva every now and then." He denies abdominal pains, acid reflux, changes in bowel habits, leg swelling, skin rashes. He endorses a deep pain "every once in a while," in his left groin. He denies headaches. He endorses glaucoma and a history of cataracts. He sees Dr. Hetty Blend in ophthalmology. He denies any other concern in the last 6 months.  The pt notes that he lives independently with his wife and still is able to do all he wants to do. He walks on trails with his wife and notes that he can generally walk as far as he likes to.  The pt notes that he had prostate cancer in 2008 or 2009, s/p prostatectomy. He did not require RT nor systemic treatments. He denies lung, heart or kidney problems.  Of note prior to the patient's visit today, pt has had a CT Neck completed on 06/05/18 with results revealing Pathologic RIGHT-sided level II, III, IV, and V  lymphadenopathy, most consistent with metastatic carcinoma. An obvious primary source is not identified. Tissue sampling is warranted.  Most recent lab results (07/08/18) of CBC and CMP is as follows: all values are WNL except for PLT at 129k.  On review of systems, pt reports slightly more tired, neck nodules, some changes in swallowing, staying active, and denies fevers, chills, drenching night sweats, unexpected weight loss, abdominal pains, changes in bowel habits, skin rashes, leg swelling, CP, SOB, difficulty breathing, headaches, other lumps or bumps, skin lesions, mouth sores, pain along the spine, back pain, leg swelling, and any other symptoms.   On PMHx the pt reports localized Prostate cancer in 2008 s/p prostatectomy.  On Social Hx the pt reports that he quit smoking over 50 years ago. He notes that he consumes about 1 glass of wine every day, without concerns for excessive drinking. 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 He denies concern for chemical or radiation exposure. On Family Hx the pt reports wife with Stage IV Non Hodgkin's Lymphoma, paternal grandmother with leukemia in her 25s, father with unspecified abdominal cancer.   Interval History:   Mario Garcia returns today for management and evaluation of his newly diagnosed Diffuse Large B-Cell Lymphoma. The patient's last visit with [place] was on 10/07/2018. The pt reports that he is doing well overall.  The pt reports that he was fatigued after his last treatment for  3 days, but it could have been because of the heat. The fatigue was not severe but it was accompanied with dizziness. He noticed a drop in his BP. Reports drinking enough water. He reports intermittent tingling in his fingertips at night.  Lab results today (10/28/2018) of CBC w/diff and CMP is as follows: all values are WNL except for HGB at 12.9, PLT at 124k, glucose at 115, Creatinine at 1.29, GFR at 50 10/28/2018 LDH is  pending  On review of systems, pt reports eating well, some constipation, intermittent tingling in fingertips, dizziness, fatigue after treatment, and denies fevers, belly pain, mouth sores concerns of infection, and any other symptoms.   MEDICAL HISTORY:  Past Medical History:  Diagnosis Date  . Cancer ([place])    PROSTATE  . History of colonic polyps   . History of kidney stones   . Hypertension   . Inguinal hernia 03/2002  . Pneumonia    "years ago"    SURGICAL HISTORY: Past Surgical History:  Procedure Laterality Date  . COLONOSCOPY    . DIRECT LARYNGOSCOPY N/A 07/08/2018   Procedure: DIRECT LARYNGOSCOPY;  Surgeon: Leta Baptist, MD;  Location: [facility];  Service: ENT;  Laterality: N/A;  . ESOPHAGOSCOPY N/A 07/08/2018   Procedure: ESOPHAGOSCOPY;  Surgeon: Leta Baptist, MD;  Location: [facility];  Service: ENT;  Laterality: N/A;  . FLEXIBLE BRONCHOSCOPY N/A 07/08/2018   Procedure: FLEXIBLE BRONCHOSCOPY;  Surgeon: Leta Baptist, MD;  Location: [facility];  Service: ENT;  Laterality: N/A;  . HERNIA REPAIR Left    inguinal hernia  . IR IMAGING GUIDED PORT INSERTION  07/30/2018  . MASS BIOPSY Right 07/08/2018   Procedure: RIGHT NECK MASS EXCISIONAL BIOPSY;  Surgeon: Leta Baptist, MD;  Location: [facility];  Service: ENT;  Laterality: Right;  . PATELLA FRACTURE SURGERY Left   . PROSTATE SURGERY  10/2005   RADIAL PROSTATECTOMY  . ROTATOR CUFF REPAIR Bilateral     SOCIAL HISTORY: Social History   Socioeconomic History  . Marital status: Married    Spouse name: Not on file  . Number of children: Not on file  . Years of education: Not on file  . Highest education level: Not on file  Occupational History  . Not on file  Social Needs  . Financial resource strain: Not on file  . Food insecurity    Worry: Not on file    Inability: Not on file  . Transportation needs    Medical: Not on file    Non-medical: Not on file  Tobacco Use  . Smoking status: Former Research scientist (life sciences)  . Smokeless tobacco: Never Used  . Tobacco  comment: stopped 60 years ago  Substance and Sexual Activity  . Alcohol use: Yes    Alcohol/week: 6.0 standard drinks    Types: 6 Standard drinks or equivalent per week  . Drug use: No  . Sexual activity: Not Currently  Lifestyle  . Physical activity    Days per week: Not on file    Minutes per session: Not on file  . Stress: Not on file  Relationships  . Social Herbalist on phone: Not on file    Gets together: Not on file    Attends religious service: Not on file    Active member of club or organization: Not on file    Attends meetings of clubs or organizations: Not on file    Relationship status: Not on file  . Intimate partner violence    Fear of current  or ex partner: Not on file    Emotionally abused: Not on file    Physically abused: Not on file    Forced sexual activity: Not on file  Other Topics Concern  . Not on file  Social History Narrative  . Not on file    FAMILY HISTORY: Family History  Problem Relation Age of Onset  . Cancer Father   . Cancer Brother     ALLERGIES:  is allergic to lisinopril and penicillins.  MEDICATIONS:  Current Outpatient Medications  Medication Sig Dispense Refill  . allopurinol (ZYLOPRIM) 300 MG tablet Take 0.5 tablets (150 mg total) by mouth daily. 30 tablet 0  . Carboxymethylcellulose Sodium (ARTIFICIAL TEARS OP) Place 1 drop into both eyes daily as needed (dry eyes).    . dorzolamide (TRUSOPT) 2 % ophthalmic solution Place 1 drop into both eyes 2 (two) times daily.    Marland Kitchen latanoprost (XALATAN) 0.005 % ophthalmic solution Place 1 drop into both eyes at bedtime.    . lidocaine-prilocaine (EMLA) cream Apply to affected area once 30 g 3  . losartan-hydrochlorothiazide (HYZAAR) 50-12.5 MG tablet Take 1 tablet by mouth daily. 90 tablet 3  . ondansetron (ZOFRAN) 8 MG tablet Take 1 tablet (8 mg total) by mouth 2 (two) times daily as needed for refractory nausea / vomiting. Start on day 3 after cyclophosphamide chemotherapy. 30  tablet 1  . oxyCODONE-acetaminophen (PERCOCET) 5-325 MG tablet Take 1 tablet by mouth every 4 (four) hours as needed for severe pain. 20 tablet 0  . pravastatin (PRAVACHOL) 40 MG tablet Take 1 tablet (40 mg total) by mouth daily. 90 tablet 2  . predniSONE (DELTASONE) 20 MG tablet Take 3 tablets (60 mg total) by mouth daily. Take on days 1-5 of chemotherapy. 15 tablet 6  . prochlorperazine (COMPAZINE) 10 MG tablet Take 1 tablet (10 mg total) by mouth every 6 (six) hours as needed (Nausea or vomiting). (Patient not taking: Reported on 09/11/2018) 30 tablet 6   No current facility-administered medications for this visit.     REVIEW OF SYSTEMS:    A 10+ POINT REVIEW OF SYSTEMS WAS OBTAINED including neurology, dermatology, psychiatry, cardiac, respiratory, lymph, extremities, GI, GU, Musculoskeletal, constitutional, breasts, reproductive, HEENT.  All pertinent positives are noted in the HPI.  All others are negative.   PHYSICAL EXAMINATION: ECOG PERFORMANCE STATUS: 1 - Symptomatic but completely ambulatory  Vitals:   10/28/18 0924  BP: 117/61  Pulse: 62  Resp: 18  Temp: 98 F (36.7 C)  SpO2: 97%   Filed Weights   10/28/18 0924  Weight: 162 lb 9.6 oz (73.8 kg)   .Body mass index is 24.01 kg/m.  GENERAL:alert, in no acute distress and comfortable SKIN: no acute rashes, no significant lesions EYES: conjunctiva are pink and non-injected, sclera anicteric OROPHARYNX: MMM, no exudates, no oropharyngeal erythema or ulceration NECK: subcentimeter nodules in right lower neck LYMPH:  no palpable lymphadenopathy in the cervical, axillary or inguinal regions LUNGS: clear to auscultation b/l with normal respiratory effort HEART: regular rate & rhythm ABDOMEN:  normoactive bowel sounds , non tender, not distended. No palpable hepatosplenomegaly.  Extremity: no pedal edema PSYCH: alert & oriented x 3 with fluent speech NEURO: no focal motor/sensory deficits   LABORATORY DATA:  I have  reviewed the data as listed  . CBC Latest Ref Rng & Units 10/28/2018 10/07/2018 09/16/2018  WBC 4.0 - 10.5 K/uL 6.1 6.3 6.8  Hemoglobin 13.0 - 17.0 g/dL 12.9(L) 12.8(L) 12.4(L)  Hematocrit 39.0 - 52.0 %  40.7 39.9 38.8(L)  Platelets 150 - 400 K/uL 124(L) 141(L) 134(L)    . CMP Latest Ref Rng & Units 10/28/2018 10/07/2018 09/16/2018  Glucose 70 - 99 mg/dL 115(H) 95 75  BUN 8 - 23 mg/dL _0 Creatinine 0.61 - 1.24 mg/dL 1.29(H) 0.79 0.96  Sodium 135 - 145 mmol/L 139 141 139  Potassium 3.5 - 5.1 mmol/L 3.9 3.9 3.9  Chloride 98 - 111 mmol/L 104 105 105  CO2 22 - 32 mmol/L _1 Calcium 8.9 - 10.3 mg/dL 8.9 8.6(L) 8.9  Total Protein 6.5 - 8.1 g/dL 6.6 7.0 7.0  Total Bilirubin 0.3 - 1.2 mg/dL 0.4 0.5 0.5  Alkaline Phos 38 - 126 U/L 71 73 76  AST 15 - 41 U/L _2 ALT 0 - 44 U/L _3 . Lab Results  Component Value Date   LDH 226 (H) 10/07/2018    07/08/18 Right Cervical Tissue Biopsy:    RADIOGRAPHIC STUDIES: I have personally reviewed the radiological images as listed and agreed with the findings in the report. Nm Pet Image Restag (ps) Skull Base To Thigh  Result Date: 10/03/2018 CLINICAL DATA:  Subsequent treatment strategy for diffuse large B-cell lymphoma. EXAM: NUCLEAR MEDICINE PET SKULL BASE TO THIGH TECHNIQUE: 8.6 mCi F-18 FDG was injected intravenously. Full-ring PET imaging was performed from the skull base to thigh after the radiotracer. CT data was obtained and used for attenuation correction and anatomic localization. Fasting blood glucose: 99 mg/dl COMPARISON:  07/22/2018 FINDINGS: Mediastinal blood pool activity: SUV max  2.1 Liver activity: SUV max 3.3 NECK: Interval decrease in hypermetabolic cervical lymphadenopathy. Bilateral II, III, IV, and V hypermetabolic lymphadenopathy seen previously has markedly reduced in the interval. Index right level II hypermetabolic lymphadenopathy measured previously at 2.4 cm short axis ( SUV max = 12.3) is now 0.6 cm short  axis and SUV max = 4.0. The right level V hypermetabolic adenopathy measured previously at 1.3 cm short axis with SUV max = 15.3 now measures 0.8 cm short axis with SUV max = 3.0. Index left level II node with SUV max = 8.1 previously now demonstrates SUV max = 4.2. Similar marked reduction in hypermetabolism of the palatine tonsils. Incidental CT findings: none CHEST: Small hypermetabolic nodes are again identified in the mediastinum, hilar regions, and axillary regions. 6 mm short axis left axillary node demonstrates SUV max = 2.3 on today's study compared to 6.3 previously. Index lymph node in the left internal mammary changed demonstrated SUV max = 4.3 previously which compares to 1.7 on the current study. The index subcarinal node measured previously at 7 mm short axis is 5 mm short axis today (79/4) with SUV max = on today's study of 3.5 compared to 5.8 previously. 6 mm right upper lobe nodule (44/8) is stable. Peripheral 7 mm right lower lobe nodule (47/8) is unchanged. Subpleural 4 mm right lower lobe nodule on 51/8 is similar to prior. 9 mm left perifissural nodule on 40/8 is stable. Other scattered small bilateral pulmonary nodules are unchanged. No hypermetabolism in the tiny pulmonary nodules on today's study. Incidental CT findings: Heart size upper normal. Coronary artery calcification is evident. Atherosclerotic calcification is noted in the wall of the thoracic aorta. Left Port-A-Cath tip is positioned at the SVC/RA junction. ABDOMEN/PELVIS: Interval marked decrease and hypermetabolic splenic parenchyma with SUV max = 3.7 compared to 11.5 previously. No splenomegaly. Hypermetabolic lymphadenopathy again noted in the porta hepatis, retroperitoneal space, mesentery, and  pelvic sidewalls. 9 mm short axis porta hepatis node demonstrates SUV max = 5.5 today compared to 9.3 previously. Index left pelvic sidewall node measuring 7 mm short axis today (177/4) is not substantially changed in size in the  interval. SUV max = 4.7 today compared to 9.5 previously. Incidental CT findings: There is abdominal aortic atherosclerosis without aneurysm. Small umbilical hernia noted. SKELETON: Interval increase in diffuse marrow uptake of radiotracer, presumably related to marrow stimulation. Incidental CT findings: No worrisome lytic or sclerotic osseous abnormality. IMPRESSION: 1. Interval response to therapy with stable to decreased size of lymph nodes on CT and generalized decrease in hypermetabolism of the abnormal nodes. Hypermetabolism in the lymph nodes today is compatible with a combination of Deauville 3 and Deauville 4 disease. 2. Stable tiny bilateral pulmonary nodules. 3. Increase radiotracer accumulation in the marrow space on today's study, presumably representing marrow stimulatory effects of therapy. 4.  Aortic Atherosclerois (ICD10-170.0) Electronically Signed   By: Misty Stanley M.D.   On: 10/03/2018 16:02    ASSESSMENT & PLAN:   85 y.o. male with  1. Diffuse Large B-Cell Lymphoma, Stage IV Presenting without constitutional symptoms. Palpable right cervical and supraclavicular lymphadenopathy.   07/08/18 Right cervical soft tissue biopsy revealed Diffuse Large B-Cell Lymphoma, germinal center type   06/05/18 CT Neck revealed Pathologic RIGHT-sided level II, III, IV, and V lymphadenopathy, most consistent with metastatic carcinoma. An obvious primary source is not identified. Tissue sampling is warranted.  07/16/18 Hep B and Hep C negative  07/17/18 ECHO revealed LV EF of 60-65%  07/22/18 PET/CT revealed "Hypermetabolic adenopathy especially concentrated in the right neck but also in the left neck, chest, abdomen, and pelvis. The adenopathy is primarily Deauville 5 although some few scattered smaller lesions are Deauville 4. 2. Diffuse abnormal splenic activity, Deauville 5, without overt splenomegaly. 3. There is also hypermetabolic Deauville 5 activity in the mildly thickened distal appendix,  and raising suspicion for involvement of the appendix. 4. Other imaging findings of potential clinical significance: Aortic Atherosclerosis. Coronary atherosclerosis."  10/03/2018 PET scan revealed "Interval response to therapy with stable to decreased size of lymph nodes on CT and generalized decrease in hypermetabolism of the abnormal nodes. Hypermetabolism in the lymph nodes today is compatible with a combination of Deauville 3 and Deauville 4 disease. Stable tiny bilateral pulmonary nodules. Increase radiotracer accumulation in the marrow space on today's study, presumably representing marrow stimulatory effects of therapy."   PLAN: -Discussed pt labwork today, 10/28/2018; blood counts and blood chemistries are steady -Keep Doxorubicin at the same dose and increase Cytoxan from 400 mg/m to 554m/m2. -The pt has no prohibitive toxicities from continuing C5 of R mini CHOP at this time. -Plan for PET scan 5-6 weeks after completion of treatment  -Recommended that the pt continue to eat well, drink at least 48-64 oz of water each day. -Will see the pt back in 3 weeks   RTC with Dr KIrene Limbofor C6 as scheduled on 11/18/2018 with labs   All of the patients questions were answered with apparent satisfaction. The patient knows to call the clinic with any problems, questions or concerns.  The total time spent in the appt was 20 minutes and more than 50% was on counseling and direct patient cares.   GSullivan LoneMD MSt. JamesAAHIVMS SAscension River District HospitalCEndoscopy Center Of Arkansas LLCHematology/Oncology Physician CKaweah [facility] (Office):       [phone](Work cell):  [phone](Fax):           [phone]  10/28/2018 10:08 AM  I, De Burrs, am acting as a scribe for Dr. Irene Limbo  .I have reviewed the above documentation for accuracy and completeness, and I agree with the above. Brunetta Genera MD

## 2018-10-28 ENCOUNTER — Inpatient Hospital Stay (HOSPITAL_BASED_OUTPATIENT_CLINIC_OR_DEPARTMENT_OTHER): Payer: Medicare Other | Admitting: Hematology

## 2018-10-28 ENCOUNTER — Inpatient Hospital Stay: Payer: Medicare Other

## 2018-10-28 ENCOUNTER — Inpatient Hospital Stay: Payer: Medicare Other | Attending: Hematology

## 2018-10-28 ENCOUNTER — Telehealth: Payer: Self-pay | Admitting: Hematology

## 2018-10-28 ENCOUNTER — Other Ambulatory Visit: Payer: Self-pay

## 2018-10-28 VITALS — BP 117/61 | HR 62 | Temp 98.0°F | Resp 18 | Ht 69.0 in | Wt 162.6 lb

## 2018-10-28 VITALS — BP 103/48 | HR 64 | Temp 98.5°F | Resp 18

## 2018-10-28 DIAGNOSIS — Z5112 Encounter for antineoplastic immunotherapy: Secondary | ICD-10-CM | POA: Diagnosis not present

## 2018-10-28 DIAGNOSIS — C8331 Diffuse large B-cell lymphoma, lymph nodes of head, face, and neck: Secondary | ICD-10-CM

## 2018-10-28 DIAGNOSIS — Z5111 Encounter for antineoplastic chemotherapy: Secondary | ICD-10-CM | POA: Diagnosis not present

## 2018-10-28 DIAGNOSIS — Z5189 Encounter for other specified aftercare: Secondary | ICD-10-CM | POA: Insufficient documentation

## 2018-10-28 DIAGNOSIS — Z95828 Presence of other vascular implants and grafts: Secondary | ICD-10-CM

## 2018-10-28 LAB — CBC WITH DIFFERENTIAL/PLATELET
Abs Immature Granulocytes: 0.02 10*3/uL (ref 0.00–0.07)
Basophils Absolute: 0 10*3/uL (ref 0.0–0.1)
Basophils Relative: 1 %
Eosinophils Absolute: 0 10*3/uL (ref 0.0–0.5)
Eosinophils Relative: 0 %
HCT: 40.7 % (ref 39.0–52.0)
Hemoglobin: 12.9 g/dL — ABNORMAL LOW (ref 13.0–17.0)
Immature Granulocytes: 0 %
Lymphocytes Relative: 22 %
Lymphs Abs: 1.4 10*3/uL (ref 0.7–4.0)
MCH: 29.9 pg (ref 26.0–34.0)
MCHC: 31.7 g/dL (ref 30.0–36.0)
MCV: 94.2 fL (ref 80.0–100.0)
Monocytes Absolute: 1 10*3/uL (ref 0.1–1.0)
Monocytes Relative: 17 %
Neutro Abs: 3.6 10*3/uL (ref 1.7–7.7)
Neutrophils Relative %: 60 %
Platelets: 124 10*3/uL — ABNORMAL LOW (ref 150–400)
RBC: 4.32 MIL/uL (ref 4.22–5.81)
RDW: 14.8 % (ref 11.5–15.5)
WBC: 6.1 10*3/uL (ref 4.0–10.5)
nRBC: 0 % (ref 0.0–0.2)

## 2018-10-28 LAB — CMP (CANCER CENTER ONLY)
ALT: 15 U/L (ref 0–44)
AST: 17 U/L (ref 15–41)
Albumin: 3.8 g/dL (ref 3.5–5.0)
Alkaline Phosphatase: 71 U/L (ref 38–126)
Anion gap: 8 (ref 5–15)
BUN: 17 mg/dL (ref 8–23)
CO2: 27 mmol/L (ref 22–32)
Calcium: 8.9 mg/dL (ref 8.9–10.3)
Chloride: 104 mmol/L (ref 98–111)
Creatinine: 1.29 mg/dL — ABNORMAL HIGH (ref 0.61–1.24)
GFR, Est AFR Am: 58 mL/min — ABNORMAL LOW (ref >60)
GFR, Estimated: 50 mL/min — ABNORMAL LOW (ref >60)
Glucose, Bld: 115 mg/dL — ABNORMAL HIGH (ref 70–99)
Potassium: 3.9 mmol/L (ref 3.5–5.1)
Sodium: 139 mmol/L (ref 135–145)
Total Bilirubin: 0.4 mg/dL (ref 0.3–1.2)
Total Protein: 6.6 g/dL (ref 6.5–8.1)

## 2018-10-28 LAB — LACTATE DEHYDROGENASE: LDH: 162 U/L (ref 98–192)

## 2018-10-28 MED ORDER — SODIUM CHLORIDE 0.9 % IV SOLN
Freq: Once | INTRAVENOUS | Status: AC
  Administered 2018-10-28: 10:00:00 via INTRAVENOUS
  Filled 2018-10-28: qty 250

## 2018-10-28 MED ORDER — DOXORUBICIN HCL CHEMO IV INJECTION 2 MG/ML
37.5000 mg/m2 | Freq: Once | INTRAVENOUS | Status: AC
  Administered 2018-10-28: 72 mg via INTRAVENOUS
  Filled 2018-10-28: qty 36

## 2018-10-28 MED ORDER — SODIUM CHLORIDE 0.9 % IV SOLN
375.0000 mg/m2 | Freq: Once | INTRAVENOUS | Status: AC
  Administered 2018-10-28: 700 mg via INTRAVENOUS
  Filled 2018-10-28: qty 20

## 2018-10-28 MED ORDER — DIPHENHYDRAMINE HCL 25 MG PO CAPS
25.0000 mg | ORAL_CAPSULE | Freq: Once | ORAL | Status: AC
  Administered 2018-10-28: 11:00:00 25 mg via ORAL

## 2018-10-28 MED ORDER — ACETAMINOPHEN 325 MG PO TABS
ORAL_TABLET | ORAL | Status: AC
  Filled 2018-10-28: qty 2

## 2018-10-28 MED ORDER — DEXAMETHASONE SODIUM PHOSPHATE 10 MG/ML IJ SOLN
INTRAMUSCULAR | Status: AC
  Filled 2018-10-28: qty 1

## 2018-10-28 MED ORDER — DIPHENHYDRAMINE HCL 25 MG PO CAPS
ORAL_CAPSULE | ORAL | Status: AC
  Filled 2018-10-28: qty 1

## 2018-10-28 MED ORDER — FAMOTIDINE IN NACL 20-0.9 MG/50ML-% IV SOLN
20.0000 mg | Freq: Once | INTRAVENOUS | Status: AC
  Administered 2018-10-28: 20 mg via INTRAVENOUS

## 2018-10-28 MED ORDER — PALONOSETRON HCL INJECTION 0.25 MG/5ML
0.2500 mg | Freq: Once | INTRAVENOUS | Status: AC
  Administered 2018-10-28: 0.25 mg via INTRAVENOUS

## 2018-10-28 MED ORDER — DEXAMETHASONE SODIUM PHOSPHATE 10 MG/ML IJ SOLN
10.0000 mg | Freq: Once | INTRAMUSCULAR | Status: AC
  Administered 2018-10-28: 10 mg via INTRAVENOUS

## 2018-10-28 MED ORDER — SODIUM CHLORIDE 0.9 % IV SOLN
500.0000 mg/m2 | Freq: Once | INTRAVENOUS | Status: AC
  Administered 2018-10-28: 960 mg via INTRAVENOUS
  Filled 2018-10-28: qty 48

## 2018-10-28 MED ORDER — SODIUM CHLORIDE 0.9% FLUSH
10.0000 mL | INTRAVENOUS | Status: DC | PRN
  Administered 2018-10-28: 10 mL
  Filled 2018-10-28: qty 10

## 2018-10-28 MED ORDER — ACETAMINOPHEN 325 MG PO TABS
650.0000 mg | ORAL_TABLET | Freq: Once | ORAL | Status: AC
  Administered 2018-10-28: 11:00:00 650 mg via ORAL

## 2018-10-28 MED ORDER — SODIUM CHLORIDE 0.9% FLUSH
10.0000 mL | INTRAVENOUS | Status: DC | PRN
  Administered 2018-10-28: 15:00:00 10 mL
  Filled 2018-10-28: qty 10

## 2018-10-28 MED ORDER — VINCRISTINE SULFATE CHEMO INJECTION 1 MG/ML
1.0000 mg | Freq: Once | INTRAVENOUS | Status: AC
  Administered 2018-10-28: 12:00:00 1 mg via INTRAVENOUS
  Filled 2018-10-28: qty 1

## 2018-10-28 MED ORDER — HEPARIN SOD (PORK) LOCK FLUSH 100 UNIT/ML IV SOLN
500.0000 [IU] | Freq: Once | INTRAVENOUS | Status: AC | PRN
  Administered 2018-10-28: 15:00:00 500 [IU]
  Filled 2018-10-28: qty 5

## 2018-10-28 MED ORDER — FAMOTIDINE IN NACL 20-0.9 MG/50ML-% IV SOLN
INTRAVENOUS | Status: AC
  Filled 2018-10-28: qty 50

## 2018-10-28 MED ORDER — PALONOSETRON HCL INJECTION 0.25 MG/5ML
INTRAVENOUS | Status: AC
  Filled 2018-10-28: qty 5

## 2018-10-28 NOTE — Patient Instructions (Signed)
[facility] Discharge Instructions for Patients Receiving Chemotherapy  Today you received the following chemotherapy agents: Adriamycin, Vincristine, Cytoxan, Rituxan   To help prevent nausea and vomiting after your treatment, we encourage you to take your nausea medication as directed by your MD.   If you develop nausea and vomiting that is not controlled by your nausea medication, call the clinic.   BELOW ARE SYMPTOMS THAT SHOULD BE REPORTED IMMEDIATELY:  *FEVER GREATER THAN 100.5 F  *CHILLS WITH OR WITHOUT FEVER  NAUSEA AND VOMITING THAT IS NOT CONTROLLED WITH YOUR NAUSEA MEDICATION  *UNUSUAL SHORTNESS OF BREATH  *UNUSUAL BRUISING OR BLEEDING  TENDERNESS IN MOUTH AND THROAT WITH OR WITHOUT PRESENCE OF ULCERS  *URINARY PROBLEMS  *BOWEL PROBLEMS  UNUSUAL RASH Items with * indicate a potential emergency and should be followed up as soon as possible.  Feel free to call the clinic should you have any questions or concerns. The clinic phone number is [phone].  Please show the [facility] at check-in to the Emergency Department and triage nurse.  Coronavirus (COVID-19) Are you at risk?  Are you at risk for the Coronavirus (COVID-19)?  To be considered HIGH RISK for Coronavirus (COVID-19), you have to meet the following criteria:  . Traveled to [place], [place], [place], [place] or [place]; or in the [place] to [place], [place], [place], or [place]; and have fever, cough, and shortness of breath within the last 2 weeks of travel OR . Been in close contact with a person diagnosed with COVID-19 within the last 2 weeks and have fever, cough, and shortness of breath . IF YOU DO NOT MEET THESE CRITERIA, YOU ARE CONSIDERED LOW RISK FOR COVID-19.  What to do if you are HIGH RISK for COVID-19?  Marland Kitchen If you are having a medical emergency, call 911. . Seek medical care right away. Before you go to a doctor's office, urgent care or emergency  department, call ahead and tell them about your recent travel, contact with someone diagnosed with COVID-19, and your symptoms. You should receive instructions from your physician's office regarding next steps of care.  . When you arrive at healthcare provider, tell the healthcare staff immediately you have returned from visiting [place], [place], [place], [place] or [place]; or traveled in the [place] to [place], [place], [place], or [place]; in the last two weeks or you have been in close contact with a person diagnosed with COVID-19 in the last 2 weeks.   . Tell the health care staff about your symptoms: fever, cough and shortness of breath. . After you have been seen by a medical provider, you will be either: o Tested for (COVID-19) and discharged home on quarantine except to seek medical care if symptoms worsen, and asked to  - Stay home and avoid contact with others until you get your results (4-5 days)  - Avoid travel on public transportation if possible (such as bus, train, or airplane) or o Sent to the Emergency Department by EMS for evaluation, COVID-19 testing, and possible admission depending on your condition and test results.  What to do if you are LOW RISK for COVID-19?  Reduce your risk of any infection by using the same precautions used for avoiding the common cold or flu:  Marland Kitchen Wash your hands often with soap and warm water for at least 20 seconds.  If soap and water are not readily available, use an alcohol-based hand sanitizer with at least 60%  alcohol.  . If coughing or sneezing, cover your mouth and nose by coughing or sneezing into the elbow areas of your shirt or coat, into a tissue or into your sleeve (not your hands). . Avoid shaking hands with others and consider head nods or verbal greetings only. . Avoid touching your eyes, nose, or mouth with unwashed hands.  . Avoid close contact with people who are sick. . Avoid places or events with large numbers of people  in one location, like concerts or sporting events. . Carefully consider travel plans you have or are making. . If you are planning any travel outside or inside the [place], visit the CDC's Travelers' Health webpage for the latest health notices. . If you have some symptoms but not all symptoms, continue to monitor at home and seek medical attention if your symptoms worsen. . If you are having a medical emergency, call 911.   [place] / e-Visit: [email or web address]         [facility]: [facility]: [phone]                   [facility]: [phone]

## 2018-10-28 NOTE — Telephone Encounter (Signed)
Per 8/3 los RTC with Dr Irene Limbo for C6 as scheduled on 11/18/2018 with labs.

## 2018-10-30 ENCOUNTER — Inpatient Hospital Stay: Payer: Medicare Other

## 2018-10-30 ENCOUNTER — Other Ambulatory Visit: Payer: Self-pay

## 2018-10-30 VITALS — BP 122/52 | HR 62 | Temp 98.9°F | Resp 18

## 2018-10-30 DIAGNOSIS — Z5189 Encounter for other specified aftercare: Secondary | ICD-10-CM | POA: Diagnosis not present

## 2018-10-30 DIAGNOSIS — Z5112 Encounter for antineoplastic immunotherapy: Secondary | ICD-10-CM | POA: Diagnosis not present

## 2018-10-30 DIAGNOSIS — Z5111 Encounter for antineoplastic chemotherapy: Secondary | ICD-10-CM | POA: Diagnosis not present

## 2018-10-30 DIAGNOSIS — C8331 Diffuse large B-cell lymphoma, lymph nodes of head, face, and neck: Secondary | ICD-10-CM | POA: Diagnosis not present

## 2018-10-30 MED ORDER — PEGFILGRASTIM-CBQV 6 MG/0.6ML ~~LOC~~ SOSY
6.0000 mg | PREFILLED_SYRINGE | Freq: Once | SUBCUTANEOUS | Status: AC
  Administered 2018-10-30: 6 mg via SUBCUTANEOUS

## 2018-10-30 MED ORDER — PEGFILGRASTIM-CBQV 6 MG/0.6ML ~~LOC~~ SOSY
PREFILLED_SYRINGE | SUBCUTANEOUS | Status: AC
  Filled 2018-10-30: qty 0.6

## 2018-10-30 NOTE — Patient Instructions (Signed)

## 2018-11-04 ENCOUNTER — Encounter: Payer: Self-pay | Admitting: Family Medicine

## 2018-11-04 ENCOUNTER — Other Ambulatory Visit: Payer: Self-pay

## 2018-11-04 ENCOUNTER — Ambulatory Visit (INDEPENDENT_AMBULATORY_CARE_PROVIDER_SITE_OTHER): Payer: Medicare Other | Admitting: Family Medicine

## 2018-11-04 VITALS — BP 153/92 | HR 77 | Temp 97.8°F | Ht 69.0 in | Wt 151.0 lb

## 2018-11-04 DIAGNOSIS — K5900 Constipation, unspecified: Secondary | ICD-10-CM

## 2018-11-04 NOTE — Progress Notes (Signed)
   Subjective:    Patient ID: Mario Garcia, male    DOB: 11/02/1931, 85 y.o.   MRN: [id number]  HPI Documentation for virtual telephone encounter.  Documentation for virtual audio and video telecommunications through [place] encounter:  The patient was located at home. The provider was located in the office. The patient did consent to this visit and is aware of possible charges through their insurance for this visit. The other persons participating in this telemedicine service were none. Time spent on call was 67minutes This virtual service is not related to other E/M service within previous 7 days. He states that after he gets chemotherapy, it causes difficulty with constipation.  He states that over the last 3 days after his last chemotherapy treatment he has been unable to have a BM.  He did try an enema with very little success.  He is apparently having a lot of rectal discomfort feeling as if he has to go but cannot.    Review of Systems     Objective:   Physical Exam Alert and in no distress otherwise not examined       Assessment & Plan:   Encounter Diagnosis  Name Primary?  . Constipation, unspecified constipation type Yes  I recommend that he try an enema again and try and hold with him longer to see if he can soften up the stool.  If this does not work, he is to come in here for an evaluation to truly assess whether he is constipated or something else was going on.

## 2018-11-05 ENCOUNTER — Telehealth: Payer: Self-pay

## 2018-11-05 NOTE — Telephone Encounter (Signed)
Patient called and would like to inform you that the treatment he was given yesterday was successful and he feels better.

## 2018-11-15 ENCOUNTER — Other Ambulatory Visit: Payer: Self-pay | Admitting: *Deleted

## 2018-11-15 DIAGNOSIS — C8331 Diffuse large B-cell lymphoma, lymph nodes of head, face, and neck: Secondary | ICD-10-CM

## 2018-11-16 NOTE — Progress Notes (Signed)
HEMATOLOGY/ONCOLOGY CLINIC NOTE  Date of Service: 11/18/2018  Patient Care Team: Denita Lung, MD as PCP - General (Family Medicine)  CHIEF COMPLAINTS/PURPOSE OF CONSULTATION:  Diffuse Large B-Cell Lymphoma  HISTORY OF PRESENTING ILLNESS:   Mario Garcia is a wonderful 85 y.o. male who has been referred to [place] by Dr. Jill Alexanders for evaluation and management of Diffuse Large B-Cell Lymphoma. The pt reports that he is doing well overall.   The pt reports that he feels that he has been a little more tired recently. He first noticed some "nodules" on his neck appear "4-6 weeks ago." He notes that these nodules haven't been painful. He appears to have presented to care with his PCP on 05/28/18, Dr. Redmond School. He notes that he has noticed that he has been sweating more in the last year. He denies noticing any other new lumps or bumps. He denies any fevers, chills, drenching night sweats, or unexpected weight loss. He notes that he has had some changes in swallowing over the last 6 months, which he characterizes as "getting strangled on his saliva every now and then." He denies abdominal pains, acid reflux, changes in bowel habits, leg swelling, skin rashes. He endorses a deep pain "every once in a while," in his left groin. He denies headaches. He endorses glaucoma and a history of cataracts. He sees Dr. Hetty Blend in ophthalmology. He denies any other concern in the last 6 months.  The pt notes that he lives independently with his wife and still is able to do all he wants to do. He walks on trails with his wife and notes that he can generally walk as far as he likes to.  The pt notes that he had prostate cancer in 2008 or 2009, s/p prostatectomy. He did not require RT nor systemic treatments. He denies lung, heart or kidney problems.  Of note prior to the patient's visit today, pt has had a CT Neck completed on 06/05/18 with results revealing Pathologic RIGHT-sided level II, III, IV, and V  lymphadenopathy, most consistent with metastatic carcinoma. An obvious primary source is not identified. Tissue sampling is warranted.  Most recent lab results (07/08/18) of CBC and CMP is as follows: all values are WNL except for PLT at 129k.  On review of systems, pt reports slightly more tired, neck nodules, some changes in swallowing, staying active, and denies fevers, chills, drenching night sweats, unexpected weight loss, abdominal pains, changes in bowel habits, skin rashes, leg swelling, CP, SOB, difficulty breathing, headaches, other lumps or bumps, skin lesions, mouth sores, pain along the spine, back pain, leg swelling, and any other symptoms.   On PMHx the pt reports localized Prostate cancer in 2008 s/p prostatectomy.  On Social Hx the pt reports that he quit smoking over 50 years ago. He notes that he consumes about 1 glass of wine every day, without concerns for excessive drinking. 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 He denies concern for chemical or radiation exposure. On Family Hx the pt reports wife with Stage IV Non Hodgkin's Lymphoma, paternal grandmother with leukemia in her 14s, father with unspecified abdominal cancer.   Interval History:   Mario Garcia returns today for management and evaluation of his recentlly diagnosed Diffuse Large B-Cell Lymphoma. The patient's last visit with [place] was on 10/28/2018. The pt reports that he is doing well overall.  The pt reports no new concerns or symptoms. He has been eating  well. He has constipation for a while after Tx, which he treats with Miralax. He does have a skin rash but his dermatologist is not concerned.  Lab results today (11/18/18) of CBC w/diff and CMP is as follows: all values are WNL except for Monocytes Absolute at 1.2k. 11/18/2018 LDH is at 157  On review of systems, pt reports hernia and denies, abdominal pain mouth sores, skin rashes, changes in bowel movements, pain or issues with  his port and any other symptoms.    MEDICAL HISTORY:  Past Medical History:  Diagnosis Date  . Cancer ([place])    PROSTATE  . History of colonic polyps   . History of kidney stones   . Hypertension   . Inguinal hernia 03/2002  . Pneumonia    "years ago"    SURGICAL HISTORY: Past Surgical History:  Procedure Laterality Date  . COLONOSCOPY    . DIRECT LARYNGOSCOPY N/A 07/08/2018   Procedure: DIRECT LARYNGOSCOPY;  Surgeon: Leta Baptist, MD;  Location: [facility];  Service: ENT;  Laterality: N/A;  . ESOPHAGOSCOPY N/A 07/08/2018   Procedure: ESOPHAGOSCOPY;  Surgeon: Leta Baptist, MD;  Location: [place];  Service: ENT;  Laterality: N/A;  . FLEXIBLE BRONCHOSCOPY N/A 07/08/2018   Procedure: FLEXIBLE BRONCHOSCOPY;  Surgeon: Leta Baptist, MD;  Location: [facility];  Service: ENT;  Laterality: N/A;  . HERNIA REPAIR Left    inguinal hernia  . IR IMAGING GUIDED PORT INSERTION  07/30/2018  . MASS BIOPSY Right 07/08/2018   Procedure: RIGHT NECK MASS EXCISIONAL BIOPSY;  Surgeon: Leta Baptist, MD;  Location: [facility];  Service: ENT;  Laterality: Right;  . PATELLA FRACTURE SURGERY Left   . PROSTATE SURGERY  10/2005   RADIAL PROSTATECTOMY  . ROTATOR CUFF REPAIR Bilateral     SOCIAL HISTORY: Social History   Socioeconomic History  . Marital status: Married    Spouse name: Not on file  . Number of children: Not on file  . Years of education: Not on file  . Highest education level: Not on file  Occupational History  . Not on file  Social Needs  . Financial resource strain: Not on file  . Food insecurity    Worry: Not on file    Inability: Not on file  . Transportation needs    Medical: Not on file    Non-medical: Not on file  Tobacco Use  . Smoking status: Former Research scientist (life sciences)  . Smokeless tobacco: Never Used  . Tobacco comment: stopped 60 years ago  Substance and Sexual Activity  . Alcohol use: Yes    Alcohol/week: 6.0 standard drinks    Types: 6 Standard drinks or equivalent per week  . Drug use: No  . Sexual  activity: Not Currently  Lifestyle  . Physical activity    Days per week: Not on file    Minutes per session: Not on file  . Stress: Not on file  Relationships  . Social Herbalist on phone: Not on file    Gets together: Not on file    Attends religious service: Not on file    Active member of club or organization: Not on file    Attends meetings of clubs or organizations: Not on file    Relationship status: Not on file  . Intimate partner violence    Fear of current or ex partner: Not on file    Emotionally abused: Not on file    Physically abused: Not on file    Forced sexual activity:  Not on file  Other Topics Concern  . Not on file  Social History Narrative  . Not on file    FAMILY HISTORY: Family History  Problem Relation Age of Onset  . Cancer Father   . Cancer Brother     ALLERGIES:  is allergic to lisinopril and penicillins.  MEDICATIONS:  Current Outpatient Medications  Medication Sig Dispense Refill  . allopurinol (ZYLOPRIM) 300 MG tablet Take 0.5 tablets (150 mg total) by mouth daily. (Patient not taking: Reported on 11/04/2018) 30 tablet 0  . Carboxymethylcellulose Sodium (ARTIFICIAL TEARS OP) Place 1 drop into both eyes daily as needed (dry eyes).    Marland Kitchen latanoprost (XALATAN) 0.005 % ophthalmic solution Place 1 drop into both eyes at bedtime.    . lidocaine-prilocaine (EMLA) cream Apply to affected area once 30 g 3  . losartan-hydrochlorothiazide (HYZAAR) 50-12.5 MG tablet Take 1 tablet by mouth daily. 90 tablet 3  . ondansetron (ZOFRAN) 8 MG tablet Take 1 tablet (8 mg total) by mouth 2 (two) times daily as needed for refractory nausea / vomiting. Start on day 3 after cyclophosphamide chemotherapy. (Patient not taking: Reported on 11/04/2018) 30 tablet 1  . oxyCODONE-acetaminophen (PERCOCET) 5-325 MG tablet Take 1 tablet by mouth every 4 (four) hours as needed for severe pain. 20 tablet 0  . pravastatin (PRAVACHOL) 40 MG tablet Take 1 tablet (40 mg  total) by mouth daily. 90 tablet 2  . predniSONE (DELTASONE) 20 MG tablet Take 3 tablets (60 mg total) by mouth daily. Take on days 1-5 of chemotherapy. 15 tablet 6  . prochlorperazine (COMPAZINE) 10 MG tablet Take 1 tablet (10 mg total) by mouth every 6 (six) hours as needed (Nausea or vomiting). (Patient not taking: Reported on 09/11/2018) 30 tablet 6   No current facility-administered medications for this visit.    Facility-Administered Medications Ordered in Other Visits  Medication Dose Route Frequency Provider Last Rate Last Dose  . heparin lock flush 100 unit/mL  500 Units Intracatheter Once PRN Brunetta Genera, MD      . sodium chloride flush (NS) 0.9 % injection 10 mL  10 mL Intracatheter PRN Brunetta Genera, MD        REVIEW OF SYSTEMS:    A 10+ POINT REVIEW OF SYSTEMS WAS OBTAINED including neurology, dermatology, psychiatry, cardiac, respiratory, lymph, extremities, GI, GU, Musculoskeletal, constitutional, breasts, reproductive, HEENT.  All pertinent positives are noted in the HPI.  All others are negative.   PHYSICAL EXAMINATION: ECOG PERFORMANCE STATUS: 1 - Symptomatic but completely ambulatory  Vitals:   11/18/18 1000  BP: 130/64  Pulse: (!) 58  Resp: 18  Temp: 98.5 F (36.9 C)  SpO2: 98%   Filed Weights   11/18/18 1000  Weight: 160 lb (72.6 kg)   .Body mass index is 23.63 kg/m.   GENERAL:alert, in no acute distress and comfortable SKIN: no acute rashes, no significant lesions EYES: conjunctiva are pink and non-injected, sclera anicteric OROPHARYNX: MMM, no exudates, no oropharyngeal erythema or ulceration NECK: supple, no JVD, subcentimeter nodules in right lower neck LYMPH:  no palpable lymphadenopathy in the cervical, axillary or inguinal regions LUNGS: clear to auscultation b/l with normal respiratory effort HEART: regular rate & rhythm ABDOMEN:  normoactive bowel sounds , non tender, not distended. Extremity: no pedal edema PSYCH: alert &  oriented x 3 with fluent speech NEURO: no focal motor/sensory deficits  LABORATORY DATA:  I have reviewed the data as listed  . CBC Latest Ref Rng & Units 11/18/2018 10/28/2018  10/07/2018  WBC 4.0 - 10.5 K/uL 6.0 6.1 6.3  Hemoglobin 13.0 - 17.0 g/dL 13.0 12.9(L) 12.8(L)  Hematocrit 39.0 - 52.0 % 39.7 40.7 39.9  Platelets 150 - 400 K/uL 167 124(L) 141(L)    . CMP Latest Ref Rng & Units 11/18/2018 10/28/2018 10/07/2018  Glucose 70 - 99 mg/dL 96 115(H) 95  BUN 8 - 23 mg/dL _0 Creatinine 0.61 - 1.24 mg/dL 0.80 1.29(H) 0.79  Sodium 135 - 145 mmol/L 139 139 141  Potassium 3.5 - 5.1 mmol/L 3.6 3.9 3.9  Chloride 98 - 111 mmol/L 104 104 105  CO2 22 - 32 mmol/L _1 Calcium 8.9 - 10.3 mg/dL 9.0 8.9 8.6(L)  Total Protein 6.5 - 8.1 g/dL 6.8 6.6 7.0  Total Bilirubin 0.3 - 1.2 mg/dL 0.4 0.4 0.5  Alkaline Phos 38 - 126 U/L 71 71 73  AST 15 - 41 U/L _2 ALT 0 - 44 U/L _3 . Lab Results  Component Value Date   LDH 157 11/18/2018    07/08/18 Right Cervical Tissue Biopsy:    RADIOGRAPHIC STUDIES: I have personally reviewed the radiological images as listed and agreed with the findings in the report. No results found.  ASSESSMENT & PLAN:   85 y.o. male with  1. Diffuse Large B-Cell Lymphoma, Stage IV Presenting without constitutional symptoms. Palpable right cervical and supraclavicular lymphadenopathy.   07/08/18 Right cervical soft tissue biopsy revealed Diffuse Large B-Cell Lymphoma, germinal center type   06/05/18 CT Neck revealed Pathologic RIGHT-sided level II, III, IV, and V lymphadenopathy, most consistent with metastatic carcinoma. An obvious primary source is not identified. Tissue sampling is warranted.  07/16/18 Hep B and Hep C negative  07/17/18 ECHO revealed LV EF of 60-65%  07/22/18 PET/CT revealed "Hypermetabolic adenopathy especially concentrated in the right neck but also in the left neck, chest, abdomen, and pelvis. The adenopathy is primarily  Deauville 5 although some few scattered smaller lesions are Deauville 4. 2. Diffuse abnormal splenic activity, Deauville 5, without overt splenomegaly. 3. There is also hypermetabolic Deauville 5 activity in the mildly thickened distal appendix, and raising suspicion for involvement of the appendix. 4. Other imaging findings of potential clinical significance: Aortic Atherosclerosis. Coronary atherosclerosis."  10/03/2018 PET scan revealed "Interval response to therapy with stable to decreased size of lymph nodes on CT and generalized decrease in hypermetabolism of the abnormal nodes. Hypermetabolism in the lymph nodes today is compatible with a combination of Deauville 3 and Deauville 4 disease. Stable tiny bilateral pulmonary nodules. Increase radiotracer accumulation in the marrow space on today's study, presumably representing marrow stimulatory effects of therapy."   PLAN: A&P: -Discussed pt labwork today, 11/18/18; all values are WNL except for Monocytes Absolute at 1.2. -Discussed 11/18/2018 LDH at 157.  -Will complete his sixth cycle of Tx and plan for PET scan 5-6 weeks after completion of treatment  -Advised elective colonoscopy at least 6 months after Tx. -The pt has no prohibitive toxicities from continuing C6 of R mini CHOP at this time. -Recommended that the pt continue to eat well, drink at least 48-64 oz of water each day. -Will see the pt back in 6 weeks  FOLLOW-UP:  PET/CT in 5 weeks RTC with Dr Irene Limbo with labs and port flush appointment in 6 weeks  The total time spent in the appt was 15 minutes and more than 50% was on counseling and direct patient cares.  All of the patient's questions  were answered with apparent satisfaction. The patient knows to call the clinic with any problems, questions or concerns.    Sullivan Lone MD [place] AAHIVMS [facility] Hematology/Oncology Physician [facility]  (Office):       [phone] (Work cell):  [phone] (Fax):            [phone]  11/18/2018 1:30 PM  I, Yevette Edwards, am acting as a scribe for Dr. Sullivan Lone.   .I have reviewed the above documentation for accuracy and completeness, and I agree with the above. Brunetta Genera MD

## 2018-11-18 ENCOUNTER — Inpatient Hospital Stay: Payer: Medicare Other

## 2018-11-18 ENCOUNTER — Telehealth: Payer: Self-pay | Admitting: Hematology

## 2018-11-18 ENCOUNTER — Inpatient Hospital Stay (HOSPITAL_BASED_OUTPATIENT_CLINIC_OR_DEPARTMENT_OTHER): Payer: Medicare Other | Admitting: Hematology

## 2018-11-18 ENCOUNTER — Other Ambulatory Visit: Payer: Self-pay

## 2018-11-18 VITALS — BP 130/64 | HR 58 | Temp 98.5°F | Resp 18 | Ht 69.0 in | Wt 160.0 lb

## 2018-11-18 VITALS — BP 104/54 | HR 56 | Temp 98.0°F | Resp 16

## 2018-11-18 DIAGNOSIS — C8331 Diffuse large B-cell lymphoma, lymph nodes of head, face, and neck: Secondary | ICD-10-CM

## 2018-11-18 DIAGNOSIS — Z5189 Encounter for other specified aftercare: Secondary | ICD-10-CM | POA: Diagnosis not present

## 2018-11-18 DIAGNOSIS — Z5111 Encounter for antineoplastic chemotherapy: Secondary | ICD-10-CM

## 2018-11-18 DIAGNOSIS — Z95828 Presence of other vascular implants and grafts: Secondary | ICD-10-CM

## 2018-11-18 DIAGNOSIS — Z5112 Encounter for antineoplastic immunotherapy: Secondary | ICD-10-CM | POA: Diagnosis not present

## 2018-11-18 LAB — CMP (CANCER CENTER ONLY)
ALT: 14 U/L (ref 0–44)
AST: 18 U/L (ref 15–41)
Albumin: 4.1 g/dL (ref 3.5–5.0)
Alkaline Phosphatase: 71 U/L (ref 38–126)
Anion gap: 7 (ref 5–15)
BUN: 12 mg/dL (ref 8–23)
CO2: 28 mmol/L (ref 22–32)
Calcium: 9 mg/dL (ref 8.9–10.3)
Chloride: 104 mmol/L (ref 98–111)
Creatinine: 0.8 mg/dL (ref 0.61–1.24)
GFR, Est AFR Am: >60 mL/min (ref >60)
GFR, Estimated: >60 mL/min (ref >60)
Glucose, Bld: 96 mg/dL (ref 70–99)
Potassium: 3.6 mmol/L (ref 3.5–5.1)
Sodium: 139 mmol/L (ref 135–145)
Total Bilirubin: 0.4 mg/dL (ref 0.3–1.2)
Total Protein: 6.8 g/dL (ref 6.5–8.1)

## 2018-11-18 LAB — CBC WITH DIFFERENTIAL (CANCER CENTER ONLY)
Abs Immature Granulocytes: 0.03 10*3/uL (ref 0.00–0.07)
Basophils Absolute: 0 10*3/uL (ref 0.0–0.1)
Basophils Relative: 0 %
Eosinophils Absolute: 0 10*3/uL (ref 0.0–0.5)
Eosinophils Relative: 0 %
HCT: 39.7 % (ref 39.0–52.0)
Hemoglobin: 13 g/dL (ref 13.0–17.0)
Immature Granulocytes: 1 %
Lymphocytes Relative: 25 %
Lymphs Abs: 1.5 10*3/uL (ref 0.7–4.0)
MCH: 30.2 pg (ref 26.0–34.0)
MCHC: 32.7 g/dL (ref 30.0–36.0)
MCV: 92.3 fL (ref 80.0–100.0)
Monocytes Absolute: 1.2 10*3/uL — ABNORMAL HIGH (ref 0.1–1.0)
Monocytes Relative: 20 %
Neutro Abs: 3.2 10*3/uL (ref 1.7–7.7)
Neutrophils Relative %: 54 %
Platelet Count: 167 10*3/uL (ref 150–400)
RBC: 4.3 MIL/uL (ref 4.22–5.81)
RDW: 13.6 % (ref 11.5–15.5)
WBC Count: 6 10*3/uL (ref 4.0–10.5)
nRBC: 0 % (ref 0.0–0.2)

## 2018-11-18 LAB — LACTATE DEHYDROGENASE: LDH: 157 U/L (ref 98–192)

## 2018-11-18 MED ORDER — DIPHENHYDRAMINE HCL 25 MG PO CAPS
25.0000 mg | ORAL_CAPSULE | Freq: Once | ORAL | Status: AC
  Administered 2018-11-18: 25 mg via ORAL

## 2018-11-18 MED ORDER — ACETAMINOPHEN 325 MG PO TABS
ORAL_TABLET | ORAL | Status: AC
  Filled 2018-11-18: qty 2

## 2018-11-18 MED ORDER — VINCRISTINE SULFATE CHEMO INJECTION 1 MG/ML
1.0000 mg | Freq: Once | INTRAVENOUS | Status: AC
  Administered 2018-11-18: 1 mg via INTRAVENOUS
  Filled 2018-11-18: qty 1

## 2018-11-18 MED ORDER — PALONOSETRON HCL INJECTION 0.25 MG/5ML
INTRAVENOUS | Status: AC
  Filled 2018-11-18: qty 5

## 2018-11-18 MED ORDER — SODIUM CHLORIDE 0.9% FLUSH
10.0000 mL | INTRAVENOUS | Status: DC | PRN
  Administered 2018-11-18: 10 mL
  Filled 2018-11-18: qty 10

## 2018-11-18 MED ORDER — DEXAMETHASONE SODIUM PHOSPHATE 10 MG/ML IJ SOLN
10.0000 mg | Freq: Once | INTRAMUSCULAR | Status: AC
  Administered 2018-11-18: 10 mg via INTRAVENOUS

## 2018-11-18 MED ORDER — DEXAMETHASONE SODIUM PHOSPHATE 10 MG/ML IJ SOLN
INTRAMUSCULAR | Status: AC
  Filled 2018-11-18: qty 1

## 2018-11-18 MED ORDER — FAMOTIDINE IN NACL 20-0.9 MG/50ML-% IV SOLN
20.0000 mg | Freq: Once | INTRAVENOUS | Status: AC
  Administered 2018-11-18: 20 mg via INTRAVENOUS

## 2018-11-18 MED ORDER — FAMOTIDINE IN NACL 20-0.9 MG/50ML-% IV SOLN
INTRAVENOUS | Status: AC
  Filled 2018-11-18: qty 50

## 2018-11-18 MED ORDER — PALONOSETRON HCL INJECTION 0.25 MG/5ML
0.2500 mg | Freq: Once | INTRAVENOUS | Status: AC
  Administered 2018-11-18: 0.25 mg via INTRAVENOUS

## 2018-11-18 MED ORDER — ACETAMINOPHEN 325 MG PO TABS
650.0000 mg | ORAL_TABLET | Freq: Once | ORAL | Status: AC
  Administered 2018-11-18: 650 mg via ORAL

## 2018-11-18 MED ORDER — DOXORUBICIN HCL CHEMO IV INJECTION 2 MG/ML
37.5000 mg/m2 | Freq: Once | INTRAVENOUS | Status: AC
  Administered 2018-11-18: 72 mg via INTRAVENOUS
  Filled 2018-11-18: qty 36

## 2018-11-18 MED ORDER — SODIUM CHLORIDE 0.9 % IV SOLN
375.0000 mg/m2 | Freq: Once | INTRAVENOUS | Status: AC
  Administered 2018-11-18: 700 mg via INTRAVENOUS
  Filled 2018-11-18: qty 50

## 2018-11-18 MED ORDER — HEPARIN SOD (PORK) LOCK FLUSH 100 UNIT/ML IV SOLN
500.0000 [IU] | Freq: Once | INTRAVENOUS | Status: AC | PRN
  Administered 2018-11-18: 500 [IU]
  Filled 2018-11-18: qty 5

## 2018-11-18 MED ORDER — SODIUM CHLORIDE 0.9 % IV SOLN
Freq: Once | INTRAVENOUS | Status: AC
  Administered 2018-11-18: 11:00:00 via INTRAVENOUS
  Filled 2018-11-18: qty 250

## 2018-11-18 MED ORDER — DIPHENHYDRAMINE HCL 25 MG PO CAPS
ORAL_CAPSULE | ORAL | Status: AC
  Filled 2018-11-18: qty 1

## 2018-11-18 MED ORDER — SODIUM CHLORIDE 0.9 % IV SOLN
500.0000 mg/m2 | Freq: Once | INTRAVENOUS | Status: AC
  Administered 2018-11-18: 960 mg via INTRAVENOUS
  Filled 2018-11-18: qty 48

## 2018-11-18 NOTE — Patient Instructions (Signed)
[facility] Discharge Instructions for Patients Receiving Chemotherapy  Today you received the following chemotherapy agents: Adriamycin, Vincristine, Cytoxan, Rituxan   To help prevent nausea and vomiting after your treatment, we encourage you to take your nausea medication as directed by your MD.   If you develop nausea and vomiting that is not controlled by your nausea medication, call the clinic.   BELOW ARE SYMPTOMS THAT SHOULD BE REPORTED IMMEDIATELY:  *FEVER GREATER THAN 100.5 F  *CHILLS WITH OR WITHOUT FEVER  NAUSEA AND VOMITING THAT IS NOT CONTROLLED WITH YOUR NAUSEA MEDICATION  *UNUSUAL SHORTNESS OF BREATH  *UNUSUAL BRUISING OR BLEEDING  TENDERNESS IN MOUTH AND THROAT WITH OR WITHOUT PRESENCE OF ULCERS  *URINARY PROBLEMS  *BOWEL PROBLEMS  UNUSUAL RASH Items with * indicate a potential emergency and should be followed up as soon as possible.  Feel free to call the clinic should you have any questions or concerns. The clinic phone number is [phone].  Please show the CHEMO ALERT CARD at check-in to the Emergency Department and triage nurse.  Coronavirus (COVID-19) Are you at risk?  Are you at risk for the Coronavirus (COVID-19)?  To be considered HIGH RISK for Coronavirus (COVID-19), you have to meet the following criteria:  . Traveled to [place], [place], [place], [place] or [place]; or in the [place] to [place], [place], [place], or [place]; and have fever, cough, and shortness of breath within the last 2 weeks of travel OR . Been in close contact with a person diagnosed with COVID-19 within the last 2 weeks and have fever, cough, and shortness of breath . IF YOU DO NOT MEET THESE CRITERIA, YOU ARE CONSIDERED LOW RISK FOR COVID-19.  What to do if you are HIGH RISK for COVID-19?  . If you are having a medical emergency, call 911. . Seek medical care right away. Before you go to a doctor's office, urgent care or emergency  department, call ahead and tell them about your recent travel, contact with someone diagnosed with COVID-19, and your symptoms. You should receive instructions from your physician's office regarding next steps of care.  . When you arrive at healthcare provider, tell the healthcare staff immediately you have returned from visiting [place], [place], [place], [place] or [place]; or traveled in the [place] to [place], [place], [place], or [place]; in the last two weeks or you have been in close contact with a person diagnosed with COVID-19 in the last 2 weeks.   . Tell the health care staff about your symptoms: fever, cough and shortness of breath. . After you have been seen by a medical provider, you will be either: o Tested for (COVID-19) and discharged home on quarantine except to seek medical care if symptoms worsen, and asked to  - Stay home and avoid contact with others until you get your results (4-5 days)  - Avoid travel on public transportation if possible (such as bus, train, or airplane) or o Sent to the Emergency Department by EMS for evaluation, COVID-19 testing, and possible admission depending on your condition and test results.  What to do if you are LOW RISK for COVID-19?  Reduce your risk of any infection by using the same precautions used for avoiding the common cold or flu:  . Wash your hands often with soap and warm water for at least 20 seconds.  If soap and water are not readily available, use an alcohol-based hand sanitizer with at least 60%   alcohol.  . If coughing or sneezing, cover your mouth and nose by coughing or sneezing into the elbow areas of your shirt or coat, into a tissue or into your sleeve (not your hands). . Avoid shaking hands with others and consider head nods or verbal greetings only. . Avoid touching your eyes, nose, or mouth with unwashed hands.  . Avoid close contact with people who are sick. . Avoid places or events with large numbers of people  in one location, like concerts or sporting events. . Carefully consider travel plans you have or are making. . If you are planning any travel outside or inside the [place], visit the CDC's Travelers' Health webpage for the latest health notices. . If you have some symptoms but not all symptoms, continue to monitor at home and seek medical attention if your symptoms worsen. . If you are having a medical emergency, call 911.   ADDITIONAL HEALTHCARE OPTIONS FOR PATIENTS   Telehealth / e-Visit: [email or web address]         [facility]: [phone]  [facility]: [phone]                   [facility]: [phone]    

## 2018-11-18 NOTE — Patient Instructions (Signed)

## 2018-11-18 NOTE — Telephone Encounter (Signed)
Scheduled appt per 8/24 los.  Spoke with patient and he is aware of his appt date and time.  Also gave the patient the number to central radiology.

## 2018-11-20 ENCOUNTER — Other Ambulatory Visit: Payer: Self-pay

## 2018-11-20 ENCOUNTER — Inpatient Hospital Stay: Payer: Medicare Other

## 2018-11-20 VITALS — BP 112/58 | HR 62 | Temp 98.2°F | Resp 18

## 2018-11-20 DIAGNOSIS — Z5189 Encounter for other specified aftercare: Secondary | ICD-10-CM | POA: Diagnosis not present

## 2018-11-20 DIAGNOSIS — Z5112 Encounter for antineoplastic immunotherapy: Secondary | ICD-10-CM | POA: Diagnosis not present

## 2018-11-20 DIAGNOSIS — Z5111 Encounter for antineoplastic chemotherapy: Secondary | ICD-10-CM | POA: Diagnosis not present

## 2018-11-20 DIAGNOSIS — C8331 Diffuse large B-cell lymphoma, lymph nodes of head, face, and neck: Secondary | ICD-10-CM

## 2018-11-20 MED ORDER — PEGFILGRASTIM-CBQV 6 MG/0.6ML ~~LOC~~ SOSY
PREFILLED_SYRINGE | SUBCUTANEOUS | Status: AC
  Filled 2018-11-20: qty 0.6

## 2018-11-20 MED ORDER — PEGFILGRASTIM-CBQV 6 MG/0.6ML ~~LOC~~ SOSY
6.0000 mg | PREFILLED_SYRINGE | Freq: Once | SUBCUTANEOUS | Status: AC
  Administered 2018-11-20: 6 mg via SUBCUTANEOUS

## 2018-11-28 ENCOUNTER — Other Ambulatory Visit: Payer: Medicare Other

## 2018-11-28 ENCOUNTER — Telehealth: Payer: Self-pay | Admitting: *Deleted

## 2018-11-28 NOTE — Telephone Encounter (Signed)
Patient called to ask if ok with Dr. Irene Limbo for him to receive his flu vaccine at his PCP office. Contacted patient - per Dr. Irene Limbo, ok to receive flu shot. Patient verbalized understanding.

## 2018-12-05 ENCOUNTER — Other Ambulatory Visit (INDEPENDENT_AMBULATORY_CARE_PROVIDER_SITE_OTHER): Payer: Medicare Other

## 2018-12-05 ENCOUNTER — Other Ambulatory Visit: Payer: Self-pay

## 2018-12-05 DIAGNOSIS — Z23 Encounter for immunization: Secondary | ICD-10-CM | POA: Diagnosis not present

## 2018-12-23 ENCOUNTER — Encounter (HOSPITAL_COMMUNITY)
Admission: RE | Admit: 2018-12-23 | Discharge: 2018-12-23 | Disposition: A | Discharge status: Home / Self Care | Payer: Medicare Other | Source: Ambulatory Visit | Attending: Hematology | Admitting: Hematology

## 2018-12-23 ENCOUNTER — Other Ambulatory Visit: Payer: Self-pay

## 2018-12-23 DIAGNOSIS — I10 Essential (primary) hypertension: Secondary | ICD-10-CM | POA: Insufficient documentation

## 2018-12-23 DIAGNOSIS — Z79899 Other long term (current) drug therapy: Secondary | ICD-10-CM | POA: Diagnosis not present

## 2018-12-23 DIAGNOSIS — C8331 Diffuse large B-cell lymphoma, lymph nodes of head, face, and neck: Secondary | ICD-10-CM

## 2018-12-23 DIAGNOSIS — Z87891 Personal history of nicotine dependence: Secondary | ICD-10-CM | POA: Insufficient documentation

## 2018-12-23 DIAGNOSIS — C8330 Diffuse large B-cell lymphoma, unspecified site: Secondary | ICD-10-CM | POA: Diagnosis not present

## 2018-12-23 LAB — GLUCOSE, CAPILLARY: Glucose-Capillary: 103 mg/dL — ABNORMAL HIGH (ref 70–99)

## 2018-12-23 MED ORDER — FLUDEOXYGLUCOSE F - 18 (FDG) INJECTION
7.8800 | Freq: Once | INTRAVENOUS | Status: AC | PRN
  Administered 2018-12-23: 7.88 via INTRAVENOUS

## 2018-12-28 NOTE — Progress Notes (Signed)
HEMATOLOGY/ONCOLOGY CLINIC NOTE  Date of Service: 12/30/2018  Patient Care Team: Denita Lung, MD as PCP - General (Family Medicine)  CHIEF COMPLAINTS/PURPOSE OF CONSULTATION:  Diffuse Large B-Cell Lymphoma  HISTORY OF PRESENTING ILLNESS:   Mario Garcia is a wonderful 85 y.o. male who has been referred to [place] by Dr. Jill Alexanders for evaluation and management of Diffuse Large B-Cell Lymphoma. The pt reports that he is doing well overall.   The pt reports that he feels that he has been a little more tired recently. He first noticed some "nodules" on his neck appear "4-6 weeks ago." He notes that these nodules haven't been painful. He appears to have presented to care with his PCP on 05/28/18, Dr. Redmond School. He notes that he has noticed that he has been sweating more in the last year. He denies noticing any other new lumps or bumps. He denies any fevers, chills, drenching night sweats, or unexpected weight loss. He notes that he has had some changes in swallowing over the last 6 months, which he characterizes as "getting strangled on his saliva every now and then." He denies abdominal pains, acid reflux, changes in bowel habits, leg swelling, skin rashes. He endorses a deep pain "every once in a while," in his left groin. He denies headaches. He endorses glaucoma and a history of cataracts. He sees Dr. Hetty Blend in ophthalmology. He denies any other concern in the last 6 months.  The pt notes that he lives independently with his wife and still is able to do all he wants to do. He walks on trails with his wife and notes that he can generally walk as far as he likes to.  The pt notes that he had prostate cancer in 2008 or 2009, s/p prostatectomy. He did not require RT nor systemic treatments. He denies lung, heart or kidney problems.  Of note prior to the patient's visit today, pt has had a CT Neck completed on 06/05/18 with results revealing Pathologic RIGHT-sided level II, III, IV, and V  lymphadenopathy, most consistent with metastatic carcinoma. An obvious primary source is not identified. Tissue sampling is warranted.  Most recent lab results (07/08/18) of CBC and CMP is as follows: all values are WNL except for PLT at 129k.  On review of systems, pt reports slightly more tired, neck nodules, some changes in swallowing, staying active, and denies fevers, chills, drenching night sweats, unexpected weight loss, abdominal pains, changes in bowel habits, skin rashes, leg swelling, CP, SOB, difficulty breathing, headaches, other lumps or bumps, skin lesions, mouth sores, pain along the spine, back pain, leg swelling, and any other symptoms.   On PMHx the pt reports localized Prostate cancer in 2008 s/p prostatectomy.  On Social Hx the pt reports that he quit smoking over 50 years ago. He notes that he consumes about 1 glass of wine every day, without concerns for excessive drinking. 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 He denies concern for chemical or radiation exposure. On Family Hx the pt reports wife with Stage IV Non Hodgkin's Lymphoma, paternal grandmother with leukemia in her 39s, father with unspecified abdominal cancer.   Interval History:   Mario Garcia returns today for management and evaluation of his recentlly diagnosed Diffuse Large B-Cell Lymphoma. We are joined by his wife, Mario Garcia via phone. The patient's last visit with [place] was on 11/18/2018. The pt reports that he is doing well overall.  The pt reports  no new concerns in the interim, although he does report some improving fatigue. He denies fevers or infection issues. Pt notes that he does not feel as good as he did before treatment but he is doing okay. He has been eating well and has even gained some weight. Pt does not have pain with his hernia but notes that it tends to bulge whenever he becomes active. He is interested in a hernia repair surgery whenever possible.   Of note since  the patient's last visit, pt has had PET/CT Whole Body Scan ([id number]) completed on 12/23/2018 with results revealing "1. Further improvement in hypermetabolic activity associated with multiple small residual lymph nodes in the neck, chest, abdomen and pelvis. None of the lymph nodes is enlarged, and no new lesions are identified. Pattern still combination of Deauville 3 and 4 disease. 2. No recurrent splenomegaly or hypermetabolic splenic activity. 3. Resolution of previously demonstrated treatment changes in the bone marrow. No focal osseous lesions."  Lab results today (12/30/18) of CBC w/diff and CMP is as follows: all values are WNL except for PLTs at 125K, Glucose at 115. 12/30/2018 LDH at 153  On review of systems, pt reports improving fatigue, bulging hernia and denies fevers, infection issues, abdominal pain and any other symptoms.   MEDICAL HISTORY:  Past Medical History:  Diagnosis Date  . Cancer ([place])    PROSTATE  . History of colonic polyps   . History of kidney stones   . Hypertension   . Inguinal hernia 03/2002  . Pneumonia    "years ago"    SURGICAL HISTORY: Past Surgical History:  Procedure Laterality Date  . COLONOSCOPY    . DIRECT LARYNGOSCOPY N/A 07/08/2018   Procedure: DIRECT LARYNGOSCOPY;  Surgeon: Leta Baptist, MD;  Location: [place];  Service: ENT;  Laterality: N/A;  . ESOPHAGOSCOPY N/A 07/08/2018   Procedure: ESOPHAGOSCOPY;  Surgeon: Leta Baptist, MD;  Location: [place];  Service: ENT;  Laterality: N/A;  . FLEXIBLE BRONCHOSCOPY N/A 07/08/2018   Procedure: FLEXIBLE BRONCHOSCOPY;  Surgeon: Leta Baptist, MD;  Location: [place];  Service: ENT;  Laterality: N/A;  . HERNIA REPAIR Left    inguinal hernia  . IR IMAGING GUIDED PORT INSERTION  07/30/2018  . MASS BIOPSY Right 07/08/2018   Procedure: RIGHT NECK MASS EXCISIONAL BIOPSY;  Surgeon: Leta Baptist, MD;  Location: [place];  Service: ENT;  Laterality: Right;  . PATELLA FRACTURE SURGERY Left   . PROSTATE SURGERY  10/2005   RADIAL  PROSTATECTOMY  . ROTATOR CUFF REPAIR Bilateral     SOCIAL HISTORY: Social History   Socioeconomic History  . Marital status: Married    Spouse name: Not on file  . Number of children: Not on file  . Years of education: Not on file  . Highest education level: Not on file  Occupational History  . Not on file  Social Needs  . Financial resource strain: Not on file  . Food insecurity    Worry: Not on file    Inability: Not on file  . Transportation needs    Medical: Not on file    Non-medical: Not on file  Tobacco Use  . Smoking status: Former Research scientist (life sciences)  . Smokeless tobacco: Never Used  . Tobacco comment: stopped 60 years ago  Substance and Sexual Activity  . Alcohol use: Yes    Alcohol/week: 6.0 standard drinks    Types: 6 Standard drinks or equivalent per week  . Drug use: No  . Sexual activity: Not Currently  Lifestyle  . Physical activity    Days per week: Not on file    Minutes per session: Not on file  . Stress: Not on file  Relationships  . Social Herbalist on phone: Not on file    Gets together: Not on file    Attends religious service: Not on file    Active member of club or organization: Not on file    Attends meetings of clubs or organizations: Not on file    Relationship status: Not on file  . Intimate partner violence    Fear of current or ex partner: Not on file    Emotionally abused: Not on file    Physically abused: Not on file    Forced sexual activity: Not on file  Other Topics Concern  . Not on file  Social History Narrative  . Not on file    FAMILY HISTORY: Family History  Problem Relation Age of Onset  . Cancer Father   . Cancer Brother     ALLERGIES:  is allergic to lisinopril and penicillins.  MEDICATIONS:  Current Outpatient Medications  Medication Sig Dispense Refill  . allopurinol (ZYLOPRIM) 300 MG tablet Take 0.5 tablets (150 mg total) by mouth daily. (Patient not taking: Reported on 11/04/2018) 30 tablet 0  .  Carboxymethylcellulose Sodium (ARTIFICIAL TEARS OP) Place 1 drop into both eyes daily as needed (dry eyes).    Marland Kitchen latanoprost (XALATAN) 0.005 % ophthalmic solution Place 1 drop into both eyes at bedtime.    . lidocaine-prilocaine (EMLA) cream Apply to affected area once 30 g 3  . losartan-hydrochlorothiazide (HYZAAR) 50-12.5 MG tablet Take 1 tablet by mouth daily. 90 tablet 3  . ondansetron (ZOFRAN) 8 MG tablet Take 1 tablet (8 mg total) by mouth 2 (two) times daily as needed for refractory nausea / vomiting. Start on day 3 after cyclophosphamide chemotherapy. (Patient not taking: Reported on 11/04/2018) 30 tablet 1  . oxyCODONE-acetaminophen (PERCOCET) 5-325 MG tablet Take 1 tablet by mouth every 4 (four) hours as needed for severe pain. 20 tablet 0  . pravastatin (PRAVACHOL) 40 MG tablet Take 1 tablet (40 mg total) by mouth daily. 90 tablet 2  . predniSONE (DELTASONE) 20 MG tablet Take 3 tablets (60 mg total) by mouth daily. Take on days 1-5 of chemotherapy. 15 tablet 6  . prochlorperazine (COMPAZINE) 10 MG tablet Take 1 tablet (10 mg total) by mouth every 6 (six) hours as needed (Nausea or vomiting). (Patient not taking: Reported on 09/11/2018) 30 tablet 6   No current facility-administered medications for this visit.     REVIEW OF SYSTEMS:    A 10+ POINT REVIEW OF SYSTEMS WAS OBTAINED including neurology, dermatology, psychiatry, cardiac, respiratory, lymph, extremities, GI, GU, Musculoskeletal, constitutional, breasts, reproductive, HEENT.  All pertinent positives are noted in the HPI.  All others are negative.   PHYSICAL EXAMINATION: ECOG PERFORMANCE STATUS: 1 - Symptomatic but completely ambulatory  Vitals:   12/30/18 0912  BP: 113/80  Pulse: 75  Resp: 17  Temp: 97.8 F (36.6 C)  SpO2: 98%   Filed Weights   12/30/18 0912  Weight: 165 lb 1.6 oz (74.9 kg)   .Body mass index is 24.38 kg/m.   GENERAL:alert, in no acute distress and comfortable SKIN: no acute rashes, no  significant lesions EYES: conjunctiva are pink and non-injected, sclera anicteric OROPHARYNX: MMM, no exudates, no oropharyngeal erythema or ulceration NECK: supple, no JVD  LYMPH:  no palpable lymphadenopathy in the cervical, axillary or  inguinal regions LUNGS: clear to auscultation b/l with normal respiratory effort HEART: regular rate & rhythm ABDOMEN:  normoactive bowel sounds , non tender, not distended. No palpable hepatosplenomegaly.  Extremity: no pedal edema PSYCH: alert & oriented x 3 with fluent speech NEURO: no focal motor/sensory deficits  LABORATORY DATA:  I have reviewed the data as listed  . CBC Latest Ref Rng & Units 12/30/2018 11/18/2018 10/28/2018  WBC 4.0 - 10.5 K/uL 5.5 6.0 6.1  Hemoglobin 13.0 - 17.0 g/dL 13.2 13.0 12.9(L)  Hematocrit 39.0 - 52.0 % 40.1 39.7 40.7  Platelets 150 - 400 K/uL 125(L) 167 124(L)    . CMP Latest Ref Rng & Units 12/30/2018 11/18/2018 10/28/2018  Glucose 70 - 99 mg/dL 115(H) 96 115(H)  BUN 8 - 23 mg/dL _0 Creatinine 0.61 - 1.24 mg/dL 0.86 0.80 1.29(H)  Sodium 135 - 145 mmol/L 140 139 139  Potassium 3.5 - 5.1 mmol/L 3.5 3.6 3.9  Chloride 98 - 111 mmol/L 104 104 104  CO2 22 - 32 mmol/L _1 Calcium 8.9 - 10.3 mg/dL 8.9 9.0 8.9  Total Protein 6.5 - 8.1 g/dL 6.6 6.8 6.6  Total Bilirubin 0.3 - 1.2 mg/dL 0.6 0.4 0.4  Alkaline Phos 38 - 126 U/L 66 71 71  AST 15 - 41 U/L _2 ALT 0 - 44 U/L _3 . Lab Results  Component Value Date   LDH 153 12/30/2018    07/08/18 Right Cervical Tissue Biopsy:    RADIOGRAPHIC STUDIES: I have personally reviewed the radiological images as listed and agreed with the findings in the report. Nm Pet Image Restag (ps) Skull Base To Thigh  Result Date: 12/23/2018 CLINICAL DATA:  Subsequent treatment strategy for diffuse large B-cell lymphoma post chemotherapy. EXAM: NUCLEAR MEDICINE PET SKULL BASE TO THIGH TECHNIQUE: 7.88 mCi F-18 FDG was injected intravenously. Full-ring PET imaging  was performed from the skull base to thigh after the radiotracer. CT data was obtained and used for attenuation correction and anatomic localization. Fasting blood glucose: 103 mg/dl COMPARISON:  PET-CT 10/03/2018 and 07/22/2018. FINDINGS: Mediastinal blood pool activity: SUV max 2.2 Liver activity: SUV max 3.4 NECK: There has been further improvement in the hypermetabolic cervical lymphadenopathy. There is a level 2 node on the left measuring approximately 11 mm on image 23/4 which has an SUV max of 5.0. There is a level 4 node on the right measuring 9 mm on image 35/4 with an SUV max of 3.3 (previously 4.0). No new or enlarging cervical lymph nodes.There are no lesions of the pharyngeal mucosal space. Incidental CT findings: Bilateral carotid atherosclerosis. CHEST: There are no residual enlarged mediastinal, hilar or axillary lymph nodes. There is low-level hypermetabolic activity within small axillary lymph nodes bilaterally (SUV max 2.8 on the right and 3.6 on the left). There is no hypermetabolic internal mammary adenopathy. The index subcarinal node measures 8 mm on image 71/4 and has an SUV max of 3.5 (previously 3.5). There is no hypermetabolic pulmonary activity. Scattered pulmonary nodules are stable, primarily perifissural in location. Incidental CT findings: Left IJ Port-A-Cath extends to the superior cavoatrial junction. Mild atherosclerosis of the aorta and coronary arteries. Stable mild chronic lung disease with biapical scarring, mild emphysema and scattered subpleural reticulation. ABDOMEN/PELVIS: There is no hypermetabolic activity within the liver, adrenal glands, spleen or pancreas. Specifically, no recurrent splenomegaly or hypermetabolic splenic activity (SUV max in the spleen 2.6, previously 3.7). There is mild residual hypermetabolic activity within  portacaval and bilateral pelvic lymph nodes. 9 mm portacaval node on image 114/4 has an SUV max of 5.0 (previously 5.5). There is a left pelvic  sidewall node with an SUV max of 7.8 (previously 7.2). Incidental CT findings: Stable soft tissue stranding throughout the mesenteric fat. Probable previous prostatectomy. Distal colonic diverticulosis, small umbilical hernia and atherosclerosis of the aorta and great vessels. SKELETON: There is no hypermetabolic activity to suggest osseous metastatic disease. The generalized metabolic activity seen throughout the bone marrow on the most recent study has resolved. Incidental CT findings: none IMPRESSION: 1. Further improvement in hypermetabolic activity associated with multiple small residual lymph nodes in the neck, chest, abdomen and pelvis. None of the lymph nodes is enlarged, and no new lesions are identified. Pattern still combination of Deauville 3 and 4 disease. 2. No recurrent splenomegaly or hypermetabolic splenic activity. 3. Resolution of previously demonstrated treatment changes in the bone marrow. No focal osseous lesions. Electronically Signed   By: Richardean Sale M.D.   On: 12/23/2018 09:55    ASSESSMENT & PLAN:   85 y.o. male with  1. Diffuse Large B-Cell Lymphoma, Stage IV Presenting without constitutional symptoms. Palpable right cervical and supraclavicular lymphadenopathy.   07/08/18 Right cervical soft tissue biopsy revealed Diffuse Large B-Cell Lymphoma, germinal center type   06/05/18 CT Neck revealed Pathologic RIGHT-sided level II, III, IV, and V lymphadenopathy, most consistent with metastatic carcinoma. An obvious primary source is not identified. Tissue sampling is warranted.  07/16/18 Hep B and Hep C negative  07/17/18 ECHO revealed LV EF of 60-65%  07/22/18 PET/CT revealed "Hypermetabolic adenopathy especially concentrated in the right neck but also in the left neck, chest, abdomen, and pelvis. The adenopathy is primarily Deauville 5 although some few scattered smaller lesions are Deauville 4. 2. Diffuse abnormal splenic activity, Deauville 5, without overt splenomegaly.  3. There is also hypermetabolic Deauville 5 activity in the mildly thickened distal appendix, and raising suspicion for involvement of the appendix. 4. Other imaging findings of potential clinical significance: Aortic Atherosclerosis. Coronary atherosclerosis."  10/03/2018 PET scan revealed "Interval response to therapy with stable to decreased size of lymph nodes on CT and generalized decrease in hypermetabolism of the abnormal nodes. Hypermetabolism in the lymph nodes today is compatible with a combination of Deauville 3 and Deauville 4 disease. Stable tiny bilateral pulmonary nodules. Increase radiotracer accumulation in the marrow space on today's study, presumably representing marrow stimulatory effects of therapy."   PLAN: -Discussed pt labwork today, 12/30/18; all values are WNL except for PLTs at 125K, Glucose at 115. -Discussed 12/30/2018 LDH at 153 -Discussed 12/23/2018 PET/CT Whole Body Scan ([id number]) which revealed "1. Further improvement in hypermetabolic activity associated with multiple small residual lymph nodes in the neck, chest, abdomen and pelvis. None of the lymph nodes is enlarged, and no new lesions are identified. Pattern still combination of Deauville 3 and 4 disease. 2. No recurrent splenomegaly or hypermetabolic splenic activity. 3. Resolution of previously demonstrated treatment changes in the bone marrow. No focal osseous lesions." -Advised pt to wait 3-6 months after a noted absence of cancer for an elective surgery  -Advised elective colonoscopy at least 6 months after Tx. -Lymphoma has continued to improve, but cannot confirm complete response  -Discussed active testing (Lymph Node Biopsy) vs. closely monitoring with labs -Will schedule [place] Needle Cervical Lymph Node Biopsy in 1 week -If Cervical Lymph Node Biopsy not possible will reimage in a few months.  -Advised that if active lymphoma found we would move  forward with continued treatment  -Will see the pt back in  2 weeks via phone  FOLLOW-UP:  US guided Lymph node  biopsy in 1 week Phone visit in 2 weeks with Dr Irene Limbo  The total time spent in the appt was 25 minutes and more than 50% was on counseling and direct patient cares.  All of the patient's questions were answered with apparent satisfaction. The patient knows to call the clinic with any problems, questions or concerns.   Sullivan Lone MD [place] A[facility]IV[facility] Hematology/Oncology Physician [facility]  (Office):       [phone] (Work cell):  [phone] (Fax):           [phone]  12/30/2018 10:41 AM  I, Yevette Edwards, am acting as a scribe for Dr. Sullivan Lone.   .I have reviewed the above documentation for accuracy and completeness, and I agree with the above. Brunetta Genera MD    ADDENDUM   Patient called back and decided against LN biopsy and wants to followup in the borderline LN with f/u scans in 8 weeks  .Brunetta Genera MD

## 2018-12-30 ENCOUNTER — Other Ambulatory Visit: Payer: Self-pay

## 2018-12-30 ENCOUNTER — Inpatient Hospital Stay: Payer: Medicare Other

## 2018-12-30 ENCOUNTER — Inpatient Hospital Stay: Payer: Medicare Other | Attending: Hematology

## 2018-12-30 ENCOUNTER — Inpatient Hospital Stay (HOSPITAL_BASED_OUTPATIENT_CLINIC_OR_DEPARTMENT_OTHER): Payer: Medicare Other | Admitting: Hematology

## 2018-12-30 VITALS — BP 113/80 | HR 75 | Temp 97.8°F | Resp 17 | Ht 69.0 in | Wt 165.1 lb

## 2018-12-30 DIAGNOSIS — I10 Essential (primary) hypertension: Secondary | ICD-10-CM | POA: Diagnosis not present

## 2018-12-30 DIAGNOSIS — Z87442 Personal history of urinary calculi: Secondary | ICD-10-CM | POA: Insufficient documentation

## 2018-12-30 DIAGNOSIS — Z8546 Personal history of malignant neoplasm of prostate: Secondary | ICD-10-CM | POA: Diagnosis not present

## 2018-12-30 DIAGNOSIS — Z808 Family history of malignant neoplasm of other organs or systems: Secondary | ICD-10-CM | POA: Diagnosis not present

## 2018-12-30 DIAGNOSIS — Z9079 Acquired absence of other genital organ(s): Secondary | ICD-10-CM | POA: Diagnosis not present

## 2018-12-30 DIAGNOSIS — Z806 Family history of leukemia: Secondary | ICD-10-CM | POA: Diagnosis not present

## 2018-12-30 DIAGNOSIS — C8331 Diffuse large B-cell lymphoma, lymph nodes of head, face, and neck: Secondary | ICD-10-CM | POA: Diagnosis not present

## 2018-12-30 DIAGNOSIS — Z87891 Personal history of nicotine dependence: Secondary | ICD-10-CM | POA: Insufficient documentation

## 2018-12-30 DIAGNOSIS — Z86010 Personal history of colonic polyps: Secondary | ICD-10-CM | POA: Insufficient documentation

## 2018-12-30 DIAGNOSIS — Z95828 Presence of other vascular implants and grafts: Secondary | ICD-10-CM

## 2018-12-30 LAB — CBC WITH DIFFERENTIAL/PLATELET
Abs Immature Granulocytes: 0.01 10*3/uL (ref 0.00–0.07)
Basophils Absolute: 0 10*3/uL (ref 0.0–0.1)
Basophils Relative: 0 %
Eosinophils Absolute: 0.1 10*3/uL (ref 0.0–0.5)
Eosinophils Relative: 2 %
HCT: 40.1 % (ref 39.0–52.0)
Hemoglobin: 13.2 g/dL (ref 13.0–17.0)
Immature Granulocytes: 0 %
Lymphocytes Relative: 27 %
Lymphs Abs: 1.5 10*3/uL (ref 0.7–4.0)
MCH: 30.2 pg (ref 26.0–34.0)
MCHC: 32.9 g/dL (ref 30.0–36.0)
MCV: 91.8 fL (ref 80.0–100.0)
Monocytes Absolute: 0.6 10*3/uL (ref 0.1–1.0)
Monocytes Relative: 11 %
Neutro Abs: 3.3 10*3/uL (ref 1.7–7.7)
Neutrophils Relative %: 60 %
Platelets: 125 10*3/uL — ABNORMAL LOW (ref 150–400)
RBC: 4.37 MIL/uL (ref 4.22–5.81)
RDW: 12.2 % (ref 11.5–15.5)
WBC: 5.5 10*3/uL (ref 4.0–10.5)
nRBC: 0 % (ref 0.0–0.2)

## 2018-12-30 LAB — CMP (CANCER CENTER ONLY)
ALT: 13 U/L (ref 0–44)
AST: 18 U/L (ref 15–41)
Albumin: 4 g/dL (ref 3.5–5.0)
Alkaline Phosphatase: 66 U/L (ref 38–126)
Anion gap: 8 (ref 5–15)
BUN: 13 mg/dL (ref 8–23)
CO2: 28 mmol/L (ref 22–32)
Calcium: 8.9 mg/dL (ref 8.9–10.3)
Chloride: 104 mmol/L (ref 98–111)
Creatinine: 0.86 mg/dL (ref 0.61–1.24)
GFR, Est AFR Am: >60 mL/min (ref >60)
GFR, Estimated: >60 mL/min (ref >60)
Glucose, Bld: 115 mg/dL — ABNORMAL HIGH (ref 70–99)
Potassium: 3.5 mmol/L (ref 3.5–5.1)
Sodium: 140 mmol/L (ref 135–145)
Total Bilirubin: 0.6 mg/dL (ref 0.3–1.2)
Total Protein: 6.6 g/dL (ref 6.5–8.1)

## 2018-12-30 LAB — LACTATE DEHYDROGENASE: LDH: 153 U/L (ref 98–192)

## 2018-12-30 MED ORDER — HEPARIN SOD (PORK) LOCK FLUSH 100 UNIT/ML IV SOLN
500.0000 [IU] | Freq: Once | INTRAVENOUS | Status: AC | PRN
  Administered 2018-12-30: 500 [IU]
  Filled 2018-12-30: qty 5

## 2018-12-30 MED ORDER — SODIUM CHLORIDE 0.9% FLUSH
10.0000 mL | INTRAVENOUS | Status: DC | PRN
  Administered 2018-12-30: 10 mL
  Filled 2018-12-30: qty 10

## 2018-12-31 ENCOUNTER — Encounter (HOSPITAL_COMMUNITY): Payer: Self-pay

## 2018-12-31 ENCOUNTER — Telehealth: Payer: Self-pay | Admitting: *Deleted

## 2018-12-31 ENCOUNTER — Telehealth: Payer: Self-pay | Admitting: Hematology

## 2018-12-31 NOTE — Progress Notes (Signed)
  Mario Garcia Male, 85 y.o., 1931-12-27 MRN:  [id number] Phone:  [phone] [name]) PCP:  Denita Lung, MD Primary Cvg:  Medicare/Medicare Part A And B  RE: Biopsy Received: Today Message Contents  Corrie Mckusick, DO  [name]  OK for US guided attempt at FNA right cervical lymph node.    FDG avid on prior PET.    [name]   Previous Messages  ----- Message -----  From: Lenore Cordia  Sent: 12/30/2018  4:33 PM EDT  To: Ir Procedure Requests  Subject: Biopsy                      Procedure Requested: US Biopsy (Lymph Nodes)    Reason for Procedure: biopsy of residual FDG avid left neck lymphadenopathy ? residual lymphoma   Provider Requesting: Brunetta Genera  Provider Telephone: [phone]    Other Info: Rad Exam in Epic

## 2018-12-31 NOTE — Telephone Encounter (Signed)
Scheduled appt per 10/5 los.  Spoke with patient and he is aware of his phone visit appt date and time.

## 2018-12-31 NOTE — Telephone Encounter (Signed)
Patient was seen yesterday - Dr. Irene Limbo discussed with patient and ordered US guided core needle biopsy of left neck lymphadenopathy in 1 week and a f/u phone appt 1 week later to discuss results. The other option was to delay the biopsy and have a scan. Patient called and has decided that he wants wait to have the biopsy and have the scan, then see Dr.Kale afterwards to discuss results.  Dr.Kale informed of pt decision about biopsy and request for scan. Appt for phone appt cancelled. Advised patient that if he has not heard from [place] in 2 weeks to schedule scan, to contact office. He verbalized understanding.

## 2019-01-01 DIAGNOSIS — H35033 Hypertensive retinopathy, bilateral: Secondary | ICD-10-CM | POA: Diagnosis not present

## 2019-01-01 DIAGNOSIS — H40011 Open angle with borderline findings, low risk, right eye: Secondary | ICD-10-CM | POA: Diagnosis not present

## 2019-01-01 DIAGNOSIS — H3562 Retinal hemorrhage, left eye: Secondary | ICD-10-CM | POA: Diagnosis not present

## 2019-01-01 DIAGNOSIS — H401122 Primary open-angle glaucoma, left eye, moderate stage: Secondary | ICD-10-CM | POA: Diagnosis not present

## 2019-01-01 LAB — HM DIABETES EYE EXAM

## 2019-01-06 ENCOUNTER — Telehealth: Payer: Self-pay | Admitting: Hematology

## 2019-01-06 NOTE — Telephone Encounter (Signed)
Scheduled appt per 10/11 sch message - pt aware of appt date and time

## 2019-01-14 ENCOUNTER — Ambulatory Visit: Payer: Medicare Other | Admitting: Hematology

## 2019-01-26 ENCOUNTER — Other Ambulatory Visit: Payer: Self-pay | Admitting: Family Medicine

## 2019-01-26 DIAGNOSIS — E785 Hyperlipidemia, unspecified: Secondary | ICD-10-CM

## 2019-03-03 ENCOUNTER — Other Ambulatory Visit: Payer: Self-pay

## 2019-03-03 ENCOUNTER — Ambulatory Visit (HOSPITAL_COMMUNITY)
Admission: RE | Admit: 2019-03-03 | Discharge: 2019-03-03 | Disposition: A | Discharge status: Home / Self Care | Payer: Medicare Other | Source: Ambulatory Visit | Attending: Hematology | Admitting: Hematology

## 2019-03-03 DIAGNOSIS — K4090 Unilateral inguinal hernia, without obstruction or gangrene, not specified as recurrent: Secondary | ICD-10-CM | POA: Diagnosis not present

## 2019-03-03 DIAGNOSIS — I700 Atherosclerosis of aorta: Secondary | ICD-10-CM | POA: Insufficient documentation

## 2019-03-03 DIAGNOSIS — C8331 Diffuse large B-cell lymphoma, lymph nodes of head, face, and neck: Secondary | ICD-10-CM | POA: Insufficient documentation

## 2019-03-03 DIAGNOSIS — I2510 Atherosclerotic heart disease of native coronary artery without angina pectoris: Secondary | ICD-10-CM | POA: Diagnosis not present

## 2019-03-03 DIAGNOSIS — C8590 Non-Hodgkin lymphoma, unspecified, unspecified site: Secondary | ICD-10-CM | POA: Diagnosis not present

## 2019-03-03 LAB — GLUCOSE, CAPILLARY: Glucose-Capillary: 99 mg/dL (ref 70–99)

## 2019-03-03 MED ORDER — FLUDEOXYGLUCOSE F - 18 (FDG) INJECTION
8.2000 | Freq: Once | INTRAVENOUS | Status: AC | PRN
  Administered 2019-03-03: 8.2 via INTRAVENOUS

## 2019-03-12 NOTE — Progress Notes (Signed)
HEMATOLOGY/ONCOLOGY CLINIC NOTE  Date of Service: 03/13/2019  Patient Care Team: Denita Lung, MD as PCP - General (Family Medicine)  CHIEF COMPLAINTS/PURPOSE OF CONSULTATION:  Diffuse Large B-Cell Lymphoma  HISTORY OF PRESENTING ILLNESS:   Mario Garcia is a wonderful 85 y.o. male who has been referred to [place] by Dr. Jill Alexanders for evaluation and management of Diffuse Large B-Cell Lymphoma. The pt reports that he is doing well overall.   The pt reports that he feels that he has been a little more tired recently. He first noticed some "nodules" on his neck appear "4-6 weeks ago." He notes that these nodules haven't been painful. He appears to have presented to care with his PCP on 05/28/18, Dr. Redmond School. He notes that he has noticed that he has been sweating more in the last year. He denies noticing any other new lumps or bumps. He denies any fevers, chills, drenching night sweats, or unexpected weight loss. He notes that he has had some changes in swallowing over the last 6 months, which he characterizes as "getting strangled on his saliva every now and then." He denies abdominal pains, acid reflux, changes in bowel habits, leg swelling, skin rashes. He endorses a deep pain "every once in a while," in his left groin. He denies headaches. He endorses glaucoma and a history of cataracts. He sees Dr. Hetty Blend in ophthalmology. He denies any other concern in the last 6 months.  The pt notes that he lives independently with his wife and still is able to do all he wants to do. He walks on trails with his wife and notes that he can generally walk as far as he likes to.  The pt notes that he had prostate cancer in 2008 or 2009, s/p prostatectomy. He did not require RT nor systemic treatments. He denies lung, heart or kidney problems.  Of note prior to the patient's visit today, pt has had a CT Neck completed on 06/05/18 with results revealing Pathologic RIGHT-sided level II, III, IV, and V  lymphadenopathy, most consistent with metastatic carcinoma. An obvious primary source is not identified. Tissue sampling is warranted.  Most recent lab results (07/08/18) of CBC and CMP is as follows: all values are WNL except for PLT at 129k.  On review of systems, pt reports slightly more tired, neck nodules, some changes in swallowing, staying active, and denies fevers, chills, drenching night sweats, unexpected weight loss, abdominal pains, changes in bowel habits, skin rashes, leg swelling, CP, SOB, difficulty breathing, headaches, other lumps or bumps, skin lesions, mouth sores, pain along the spine, back pain, leg swelling, and any other symptoms.   On PMHx the pt reports localized Prostate cancer in 2008 s/p prostatectomy.  On Social Hx the pt reports that he quit smoking over 50 years ago. He notes that he consumes about 1 glass of wine every day, without concerns for excessive drinking. 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 He denies concern for chemical or radiation exposure. On Family Hx the pt reports wife with Stage IV Non Hodgkin's Lymphoma, paternal grandmother with leukemia in her 42s, father with unspecified abdominal cancer.   Interval History:   Mario Garcia returns today for management and evaluation of his recentlly diagnosed Diffuse Large B-Cell Lymphoma. The patient's last visit with [place] was on 12/30/2018. The pt reports that he is doing well overall.  The pt reports he feels that he is improving. He is still  experiences some fatigue, but it does not interfere with his daily functions.   Of note since the patient's last visit, pt has had NM PET Image Restag (PS) Skull Base To Thigh (Accession [id number]) completed on 03/03/2019 with results revealing "1. Small mildly hypermetabolic lymph nodes in the neck, chest, abdomen and pelvis, as before (Deauville 3/4). No new lesions. Normal spleen size.2. Right inguinal hernia contains unobstructed  small bowel.3.  Aortic atherosclerosis (ICD10-170.0)."  Lab results today (03/13/19) of CBC w/diff and CMP is as follows: all values are WNL except for Platelets at 139, LDH at 153, Glucose Bld at 109.  On review of systems, pt reports no new changes and denies back pain and any other symptoms.     MEDICAL HISTORY:  Past Medical History:  Diagnosis Date  . Cancer ([place])    PROSTATE  . History of colonic polyps   . History of kidney stones   . Hypertension   . Inguinal hernia 03/2002  . Pneumonia    "years ago"    SURGICAL HISTORY: Past Surgical History:  Procedure Laterality Date  . COLONOSCOPY    . DIRECT LARYNGOSCOPY N/A 07/08/2018   Procedure: DIRECT LARYNGOSCOPY;  Surgeon: Leta Baptist, MD;  Location: [facility];  Service: ENT;  Laterality: N/A;  . ESOPHAGOSCOPY N/A 07/08/2018   Procedure: ESOPHAGOSCOPY;  Surgeon: Leta Baptist, MD;  Location: [facility];  Service: ENT;  Laterality: N/A;  . FLEXIBLE BRONCHOSCOPY N/A 07/08/2018   Procedure: FLEXIBLE BRONCHOSCOPY;  Surgeon: Leta Baptist, MD;  Location: [facility];  Service: ENT;  Laterality: N/A;  . HERNIA REPAIR Left    inguinal hernia  . IR IMAGING GUIDED PORT INSERTION  07/30/2018  . MASS BIOPSY Right 07/08/2018   Procedure: RIGHT NECK MASS EXCISIONAL BIOPSY;  Surgeon: Leta Baptist, MD;  Location: [facility];  Service: ENT;  Laterality: Right;  . PATELLA FRACTURE SURGERY Left   . PROSTATE SURGERY  10/2005   RADIAL PROSTATECTOMY  . ROTATOR CUFF REPAIR Bilateral     SOCIAL HISTORY: Social History   Socioeconomic History  . Marital status: Married    Spouse name: Not on file  . Number of children: Not on file  . Years of education: Not on file  . Highest education level: Not on file  Occupational History  . Not on file  Tobacco Use  . Smoking status: Former Research scientist (life sciences)  . Smokeless tobacco: Never Used  . Tobacco comment: stopped 60 years ago  Substance and Sexual Activity  . Alcohol use: Yes    Alcohol/week: 6.0 standard drinks    Types: 6 Standard  drinks or equivalent per week  . Drug use: No  . Sexual activity: Not Currently  Other Topics Concern  . Not on file  Social History Narrative  . Not on file   Social Determinants of Health   Financial Resource Strain:   . Difficulty of Paying Living Expenses: Not on file  Food Insecurity:   . Worried About Charity fundraiser in the Last Year: Not on file  . Ran Out of Food in the Last Year: Not on file  Transportation Needs:   . Lack of Transportation (Medical): Not on file  . Lack of Transportation (Non-Medical): Not on file  Physical Activity:   . Days of Exercise per Week: Not on file  . Minutes of Exercise per Session: Not on file  Stress:   . Feeling of Stress : Not on file  Social Connections:   . Frequency of Communication with Friends  and Family: Not on file  . Frequency of Social Gatherings with Friends and Family: Not on file  . Attends Religious Services: Not on file  . Active Member of Clubs or Organizations: Not on file  . Attends Archivist Meetings: Not on file  . Marital Status: Not on file  Intimate Partner Violence:   . Fear of Current or Ex-Partner: Not on file  . Emotionally Abused: Not on file  . Physically Abused: Not on file  . Sexually Abused: Not on file    FAMILY HISTORY: Family History  Problem Relation Age of Onset  . Cancer Father   . Cancer Brother     ALLERGIES:  is allergic to lisinopril and penicillins.  MEDICATIONS:  Current Outpatient Medications  Medication Sig Dispense Refill  . allopurinol (ZYLOPRIM) 300 MG tablet Take 0.5 tablets (150 mg total) by mouth daily. (Patient not taking: Reported on 11/04/2018) 30 tablet 0  . Carboxymethylcellulose Sodium (ARTIFICIAL TEARS OP) Place 1 drop into both eyes daily as needed (dry eyes).    Marland Kitchen latanoprost (XALATAN) 0.005 % ophthalmic solution Place 1 drop into both eyes at bedtime.    . lidocaine-prilocaine (EMLA) cream Apply to affected area once 30 g 3  .  losartan-hydrochlorothiazide (HYZAAR) 50-12.5 MG tablet Take 1 tablet by mouth daily. 90 tablet 3  . ondansetron (ZOFRAN) 8 MG tablet Take 1 tablet (8 mg total) by mouth 2 (two) times daily as needed for refractory nausea / vomiting. Start on day 3 after cyclophosphamide chemotherapy. (Patient not taking: Reported on 11/04/2018) 30 tablet 1  . oxyCODONE-acetaminophen (PERCOCET) 5-325 MG tablet Take 1 tablet by mouth every 4 (four) hours as needed for severe pain. 20 tablet 0  . pravastatin (PRAVACHOL) 40 MG tablet Take 1 tablet by mouth once daily 90 tablet 0  . predniSONE (DELTASONE) 20 MG tablet Take 3 tablets (60 mg total) by mouth daily. Take on days 1-5 of chemotherapy. 15 tablet 6  . prochlorperazine (COMPAZINE) 10 MG tablet Take 1 tablet (10 mg total) by mouth every 6 (six) hours as needed (Nausea or vomiting). (Patient not taking: Reported on 09/11/2018) 30 tablet 6   No current facility-administered medications for this visit.    REVIEW OF SYSTEMS:   A 10+ POINT REVIEW OF SYSTEMS WAS OBTAINED including neurology, dermatology, psychiatry, cardiac, respiratory, lymph, extremities, GI, GU, Musculoskeletal, constitutional, breasts, reproductive, HEENT.  All pertinent positives are noted in the HPI.  All others are negative.    PHYSICAL EXAMINATION: ECOG FS:1 - Symptomatic but completely ambulatory  Vitals:   03/13/19 0956  BP: 138/68  Pulse: 70  Resp: 18  Temp: 97.9 F (36.6 C)  SpO2: 98%   Wt Readings from Last 3 Encounters:  03/13/19 168 lb (76.2 kg)  12/30/18 165 lb 1.6 oz (74.9 kg)  11/18/18 160 lb (72.6 kg)   Body mass index is 24.81 kg/m.    GENERAL:alert, in no acute distress and comfortable SKIN: no acute rashes, no significant lesions EYES: conjunctiva are pink and non-injected, sclera anicteric OROPHARYNX: MMM, no exudates, no oropharyngeal erythema or ulceration NECK: supple, no JVD LYMPH:  no palpable lymphadenopathy in the cervical, axillary or inguinal regions  SMALL lymph on right side of neck LUNGS: clear to auscultation b/l with normal respiratory effort HEART: regular rate & rhythm ABDOMEN:  normoactive bowel sounds , non tender, not distended. Extremity: no pedal edema PSYCH: alert & oriented x 3 with fluent speech NEURO: no focal motor/sensory deficits  LABORATORY DATA:  I  have reviewed the data as listed  . CBC Latest Ref Rng & Units 03/13/2019 12/30/2018 11/18/2018  WBC 4.0 - 10.5 K/uL 5.7 5.5 6.0  Hemoglobin 13.0 - 17.0 g/dL 13.9 13.2 13.0  Hematocrit 39.0 - 52.0 % 42.9 40.1 39.7  Platelets 150 - 400 K/uL 139(L) 125(L) 167    . CMP Latest Ref Rng & Units 03/13/2019 12/30/2018 11/18/2018  Glucose 70 - 99 mg/dL 109(H) 115(H) 96  BUN 8 - 23 mg/dL _0 Creatinine 0.61 - 1.24 mg/dL 0.82 0.86 0.80  Sodium 135 - 145 mmol/L 141 140 139  Potassium 3.5 - 5.1 mmol/L 3.7 3.5 3.6  Chloride 98 - 111 mmol/L 104 104 104  CO2 22 - 32 mmol/L _1 Calcium 8.9 - 10.3 mg/dL 8.9 8.9 9.0  Total Protein 6.5 - 8.1 g/dL 7.1 6.6 6.8  Total Bilirubin 0.3 - 1.2 mg/dL 0.4 0.6 0.4  Alkaline Phos 38 - 126 U/L 65 66 71  AST 15 - 41 U/L _2 ALT 0 - 44 U/L _3 . Lab Results  Component Value Date   LDH 153 03/13/2019   03/03/2019 NM PET Image Restag (PS) Skull Base To Thigh (Accession [id number]):   07/08/18 Right Cervical Tissue Biopsy:    RADIOGRAPHIC STUDIES: I have personally reviewed the radiological images as listed and agreed with the findings in the report. NM PET Image Restag (PS) Skull Base To Thigh  Result Date: 03/03/2019 CLINICAL DATA:  Subsequent treatment strategy for lymphoma. EXAM: NUCLEAR MEDICINE PET SKULL BASE TO THIGH TECHNIQUE: 8.2 mCi F-18 FDG was injected intravenously. Full-ring PET imaging was performed from the skull base to thigh after the radiotracer. CT data was obtained and used for attenuation correction and anatomic localization. Fasting blood glucose: 99 mg/dl COMPARISON:  12/23/2018. FINDINGS:  Mediastinal blood pool activity: SUV max 3.0 Liver activity: SUV max 3.5 NECK: Mildly hypermetabolic lymph nodes are seen in the neck bilaterally. Index right level-II lymph node measures 8 mm (4/31) with an SUV max of 5.1, likely similar to the prior exam. Mildly asymmetric right tonsillar activity, SUV max 5.8, possibly physiologic. Incidental CT findings: None. CHEST: Small bilateral subpectoral and axillary lymph nodes are unchanged in size and similar to blood pool. Index left subpectoral lymph node measures 7 mm (4/57) with an SUV max of 3.1. 5 mm left internal mammary lymph node (4/64) has an SUV max 2.4, stable. No hypermetabolic mediastinal or hilar lymph nodes. No hypermetabolic pulmonary nodules. Incidental CT findings: Left IJ Port-A-Cath terminates at the SVC RA junction. Atherosclerotic calcification of the aorta, aortic valve and coronary arteries. Heart is enlarged. No pericardial or pleural effusion. Subpleural nodules are seen bilaterally and measure up to approximately 5 mm in the right lower lobe, unchanged and likely subpleural lymph nodes. ABDOMEN/PELVIS: No abnormal hypermetabolism in the liver, adrenal glands, spleen or pancreas. Slight misregistration artifact is noted. Periportal lymph nodes measure up to 8 mm (4/113) with an SUV max 4.2, stable. There are scattered small mildly hypermetabolic abdominopelvic retroperitoneal lymph nodes as before. Index aortocaval lymph node measures 6 mm (4/143 with an SUV max of 3.9, similar. Index left external iliac lymph node measures 9 mm (4/177) with an SUV max 5.8, stable in size and minimally decreased in hypermetabolism, previously 7.8. Small inguinal lymph nodes do not show abnormal metabolism above blood pool. Incidental CT findings: Liver, gallbladder, adrenal glands, kidneys, spleen, pancreas and stomach are unremarkable. Right inguinal hernia contains unobstructed small  bowel. Remainder of the small bowel and colon are grossly unremarkable.  Mesenteric haziness and mild nodularity, unchanged. Atherosclerotic calcification of the aorta without aneurysm. Trace pelvic free fluid. SKELETON: No abnormal osseous hypermetabolism. Incidental CT findings: Degenerative changes in the spine. IMPRESSION: 1. Small mildly hypermetabolic lymph nodes in the neck, chest, abdomen and pelvis, as before (Deauville 3/4). No new lesions. Normal spleen size. 2. Right inguinal hernia contains unobstructed small bowel. 3.  Aortic atherosclerosis (ICD10-170.0). Electronically Signed   By: Lorin Picket M.D.   On: 03/03/2019 09:12    ASSESSMENT & PLAN:   85 y.o. male with  1. Diffuse Large B-Cell Lymphoma, Stage IV Presenting without constitutional symptoms. Palpable right cervical and supraclavicular lymphadenopathy.   07/08/18 Right cervical soft tissue biopsy revealed Diffuse Large B-Cell Lymphoma, germinal center type   06/05/18 CT Neck revealed Pathologic RIGHT-sided level II, III, IV, and V lymphadenopathy, most consistent with metastatic carcinoma. An obvious primary source is not identified. Tissue sampling is warranted.  07/16/18 Hep B and Hep C negative  07/17/18 ECHO revealed LV EF of 60-65%  07/22/18 PET/CT revealed "Hypermetabolic adenopathy especially concentrated in the right neck but also in the left neck, chest, abdomen, and pelvis. The adenopathy is primarily Deauville 5 although some few scattered smaller lesions are Deauville 4. 2. Diffuse abnormal splenic activity, Deauville 5, without overt splenomegaly. 3. There is also hypermetabolic Deauville 5 activity in the mildly thickened distal appendix, and raising suspicion for involvement of the appendix. 4. Other imaging findings of potential clinical significance: Aortic Atherosclerosis. Coronary atherosclerosis."  10/03/2018 PET scan revealed "Interval response to therapy with stable to decreased size of lymph nodes on CT and generalized decrease in hypermetabolism of the abnormal nodes.  Hypermetabolism in the lymph nodes today is compatible with a combination of Deauville 3 and Deauville 4 disease. Stable tiny bilateral pulmonary nodules. Increase radiotracer accumulation in the marrow space on today's study, presumably representing marrow stimulatory effects of therapy."   PLAN: -Discussed pt labwork today, 03/13/19; all values are WNL except for Platelets at 139, LDH at 153, Glucose Bld at 109. -Discussed 03/13/19 hemoglobin at 13.9 -Discussed 03/13/19 at platelets at 139 -Discussed 03/13/19 LDH at 153, advised that this is used to monitor Lymphoma progression/degression.  -Discussed 03/13/19 Glucose Bld at 109 -Advised that previous PET scan showed lymph nodes are now smaller that a cm in size.  -Discussed NM PET Image Restag (PS) Skull Base To Thigh (Accession [id number]) completed on 03/03/2019 with results revealing "1. Small mildly hypermetabolic lymph nodes in the neck, chest, abdomen and pelvis, as before (Deauville 3/4). No new lesions. Normal spleen size.2. Right inguinal hernia contains unobstructed small bowel.3.  Aortic atherosclerosis (ICD10-170.0)." -Discussed further options such a biopsy to determine if lymph nodes are lymphoma related or benign.  -Advised at his age, the amount of treatment he has had, and that there has been no change with lymph nodes appearing less bright on PET scans, a conservative approach would be reasonable. He would like to discuss his options with his wife and family before making a decision. -Advised to continue following covid precautions -Discussed keeping port in until final decision is made about biopsy  -interval f/u with PET/CT in 12 weeks   FOLLOW UP: PET/CT in 12 weeks  RTC with Dr Irene Limbo in 3 months with labs and portflush appointment  . The total time spent in the appointment was 25 minutes and more than 50% was on counseling and direct patient cares.  Sullivan Lone MD MS AAHIVMS [facility] Hematology/Oncology  Physician [facility]  (Office):       [phone] (Work cell):  [phone] (Fax):           [phone]  03/12/2019 4:21 PM  I, Scot Dock, am acting as a scribe for Dr. Sullivan Lone.   .I have reviewed the above documentation for accuracy and completeness, and I agree with the above. Brunetta Genera MD

## 2019-03-13 ENCOUNTER — Inpatient Hospital Stay: Payer: Medicare Other

## 2019-03-13 ENCOUNTER — Inpatient Hospital Stay: Payer: Medicare Other | Attending: Hematology | Admitting: Hematology

## 2019-03-13 ENCOUNTER — Other Ambulatory Visit: Payer: Self-pay

## 2019-03-13 VITALS — BP 138/68 | HR 70 | Temp 97.9°F | Resp 18 | Ht 69.0 in | Wt 168.0 lb

## 2019-03-13 DIAGNOSIS — Z95828 Presence of other vascular implants and grafts: Secondary | ICD-10-CM

## 2019-03-13 DIAGNOSIS — C8331 Diffuse large B-cell lymphoma, lymph nodes of head, face, and neck: Secondary | ICD-10-CM | POA: Diagnosis present

## 2019-03-13 LAB — CBC WITH DIFFERENTIAL/PLATELET
Abs Immature Granulocytes: 0.01 10*3/uL (ref 0.00–0.07)
Basophils Absolute: 0 10*3/uL (ref 0.0–0.1)
Basophils Relative: 0 %
Eosinophils Absolute: 0.1 10*3/uL (ref 0.0–0.5)
Eosinophils Relative: 2 %
HCT: 42.9 % (ref 39.0–52.0)
Hemoglobin: 13.9 g/dL (ref 13.0–17.0)
Immature Granulocytes: 0 %
Lymphocytes Relative: 37 %
Lymphs Abs: 2.1 10*3/uL (ref 0.7–4.0)
MCH: 29.5 pg (ref 26.0–34.0)
MCHC: 32.4 g/dL (ref 30.0–36.0)
MCV: 91.1 fL (ref 80.0–100.0)
Monocytes Absolute: 0.6 10*3/uL (ref 0.1–1.0)
Monocytes Relative: 11 %
Neutro Abs: 2.9 10*3/uL (ref 1.7–7.7)
Neutrophils Relative %: 50 %
Platelets: 139 10*3/uL — ABNORMAL LOW (ref 150–400)
RBC: 4.71 MIL/uL (ref 4.22–5.81)
RDW: 12.4 % (ref 11.5–15.5)
WBC: 5.7 10*3/uL (ref 4.0–10.5)
nRBC: 0 % (ref 0.0–0.2)

## 2019-03-13 LAB — CMP (CANCER CENTER ONLY)
ALT: 14 U/L (ref 0–44)
AST: 19 U/L (ref 15–41)
Albumin: 4.1 g/dL (ref 3.5–5.0)
Alkaline Phosphatase: 65 U/L (ref 38–126)
Anion gap: 11 (ref 5–15)
BUN: 21 mg/dL (ref 8–23)
CO2: 26 mmol/L (ref 22–32)
Calcium: 8.9 mg/dL (ref 8.9–10.3)
Chloride: 104 mmol/L (ref 98–111)
Creatinine: 0.82 mg/dL (ref 0.61–1.24)
GFR, Est AFR Am: >60 mL/min (ref >60)
GFR, Estimated: >60 mL/min (ref >60)
Glucose, Bld: 109 mg/dL — ABNORMAL HIGH (ref 70–99)
Potassium: 3.7 mmol/L (ref 3.5–5.1)
Sodium: 141 mmol/L (ref 135–145)
Total Bilirubin: 0.4 mg/dL (ref 0.3–1.2)
Total Protein: 7.1 g/dL (ref 6.5–8.1)

## 2019-03-13 LAB — LACTATE DEHYDROGENASE: LDH: 153 U/L (ref 98–192)

## 2019-03-13 MED ORDER — SODIUM CHLORIDE 0.9% FLUSH
10.0000 mL | INTRAVENOUS | Status: DC | PRN
  Administered 2019-03-13: 10 mL
  Filled 2019-03-13: qty 10

## 2019-03-13 MED ORDER — HEPARIN SOD (PORK) LOCK FLUSH 100 UNIT/ML IV SOLN
500.0000 [IU] | Freq: Once | INTRAVENOUS | Status: AC | PRN
  Administered 2019-03-13: 500 [IU]
  Filled 2019-03-13: qty 5

## 2019-03-14 ENCOUNTER — Telehealth: Payer: Self-pay | Admitting: Hematology

## 2019-03-14 NOTE — Telephone Encounter (Signed)
Scheduled appt per 12/17 los.  Spoke with pt and he is aware of the appt date and time.

## 2019-05-05 ENCOUNTER — Other Ambulatory Visit: Payer: Self-pay | Admitting: Family Medicine

## 2019-05-05 DIAGNOSIS — E785 Hyperlipidemia, unspecified: Secondary | ICD-10-CM

## 2019-05-10 ENCOUNTER — Ambulatory Visit: Payer: Medicare Other

## 2019-05-11 ENCOUNTER — Ambulatory Visit: Payer: Medicare Other | Attending: Internal Medicine

## 2019-05-11 DIAGNOSIS — Z23 Encounter for immunization: Secondary | ICD-10-CM | POA: Insufficient documentation

## 2019-05-11 NOTE — Progress Notes (Signed)
   Covid-19 Vaccination Clinic  Name:  Mario Garcia    MRN: [id number] DOB: [birth year]  05/11/2019  Mario Garcia was observed post Covid-19 immunization for 15 minutes without incidence. He was provided with Vaccine Information Sheet and instruction to access the V-Safe system.   Mario Garcia was instructed to call 911 with any severe reactions post vaccine: Marland Kitchen Difficulty breathing  . Swelling of your face and throat  . A fast heartbeat  . A bad rash all over your body  . Dizziness and weakness    Immunizations Administered    Name Date Dose VIS Date Route   Pfizer COVID-19 Vaccine 05/11/2019  8:47 AM 0.3 mL 03/07/2019 Intramuscular   Manufacturer: [place]   Lot: [id number]   [place]: [id number]

## 2019-05-27 DIAGNOSIS — H3562 Retinal hemorrhage, left eye: Secondary | ICD-10-CM | POA: Diagnosis not present

## 2019-05-27 DIAGNOSIS — H40011 Open angle with borderline findings, low risk, right eye: Secondary | ICD-10-CM | POA: Diagnosis not present

## 2019-05-27 DIAGNOSIS — H401122 Primary open-angle glaucoma, left eye, moderate stage: Secondary | ICD-10-CM | POA: Diagnosis not present

## 2019-05-27 DIAGNOSIS — H35033 Hypertensive retinopathy, bilateral: Secondary | ICD-10-CM | POA: Diagnosis not present

## 2019-05-27 LAB — HM DIABETES EYE EXAM

## 2019-06-03 ENCOUNTER — Ambulatory Visit: Payer: Medicare Other | Attending: Internal Medicine

## 2019-06-03 DIAGNOSIS — Z23 Encounter for immunization: Secondary | ICD-10-CM | POA: Insufficient documentation

## 2019-06-03 NOTE — Progress Notes (Signed)
   Covid-19 Vaccination Clinic  Name:  Mario Garcia    MRN: [id number] DOB: 1931-12-01  06/03/2019  Mario Garcia was observed post Covid-19 immunization for 15 minutes without incident. He was provided with Vaccine Information Sheet and instruction to access the V-Safe system.   Mario Garcia was instructed to call 911 with any severe reactions post vaccine: Marland Kitchen Difficulty breathing  . Swelling of face and throat  . A fast heartbeat  . A bad rash all over body  . Dizziness and weakness   Immunizations Administered    Name Date Dose VIS Date Route   Pfizer COVID-19 Vaccine 06/03/2019  8:15 AM 0.3 mL 03/07/2019 Intramuscular   Manufacturer: [place]   Lot: [id number]   [place]: [id number]

## 2019-06-05 ENCOUNTER — Ambulatory Visit (HOSPITAL_COMMUNITY)
Admission: RE | Admit: 2019-06-05 | Discharge: 2019-06-05 | Disposition: A | Discharge status: Home / Self Care | Payer: Medicare Other | Source: Ambulatory Visit | Attending: Hematology | Admitting: Hematology

## 2019-06-05 ENCOUNTER — Other Ambulatory Visit: Payer: Self-pay

## 2019-06-05 DIAGNOSIS — C8331 Diffuse large B-cell lymphoma, lymph nodes of head, face, and neck: Secondary | ICD-10-CM | POA: Insufficient documentation

## 2019-06-05 DIAGNOSIS — I2510 Atherosclerotic heart disease of native coronary artery without angina pectoris: Secondary | ICD-10-CM | POA: Insufficient documentation

## 2019-06-05 DIAGNOSIS — I700 Atherosclerosis of aorta: Secondary | ICD-10-CM | POA: Diagnosis not present

## 2019-06-05 DIAGNOSIS — K4090 Unilateral inguinal hernia, without obstruction or gangrene, not specified as recurrent: Secondary | ICD-10-CM | POA: Insufficient documentation

## 2019-06-05 LAB — GLUCOSE, CAPILLARY: Glucose-Capillary: 97 mg/dL (ref 70–99)

## 2019-06-05 MED ORDER — FLUDEOXYGLUCOSE F - 18 (FDG) INJECTION
8.3000 | Freq: Once | INTRAVENOUS | Status: AC | PRN
  Administered 2019-06-05: 8.3 via INTRAVENOUS

## 2019-06-11 NOTE — Progress Notes (Signed)
HEMATOLOGY/ONCOLOGY CLINIC NOTE  Date of Service: 06/11/2019  Patient Care Team: Denita Lung, MD as PCP - General (Family Medicine)  CHIEF COMPLAINTS/PURPOSE OF CONSULTATION:  Diffuse Large B-Cell Lymphoma  HISTORY OF PRESENTING ILLNESS:   Mario Garcia is a wonderful 86 y.o. male who has been referred to [place] by Dr. Jill Alexanders for evaluation and management of Diffuse Large B-Cell Lymphoma. The pt reports that he is doing well overall.   The pt reports that he feels that he has been a little more tired recently. He first noticed some "nodules" on his neck appear "4-6 weeks ago." He notes that these nodules haven't been painful. He appears to have presented to care with his PCP on 05/28/18, Dr. Redmond School. He notes that he has noticed that he has been sweating more in the last year. He denies noticing any other new lumps or bumps. He denies any fevers, chills, drenching night sweats, or unexpected weight loss. He notes that he has had some changes in swallowing over the last 6 months, which he characterizes as "getting strangled on his saliva every now and then." He denies abdominal pains, acid reflux, changes in bowel habits, leg swelling, skin rashes. He endorses a deep pain "every once in a while," in his left groin. He denies headaches. He endorses glaucoma and a history of cataracts. He sees Dr. Hetty Blend in ophthalmology. He denies any other concern in the last 6 months.  The pt notes that he lives independently with his wife and still is able to do all he wants to do. He walks on trails with his wife and notes that he can generally walk as far as he likes to.  The pt notes that he had prostate cancer in 2008 or 2009, s/p prostatectomy. He did not require RT nor systemic treatments. He denies lung, heart or kidney problems.  Of note prior to the patient's visit today, pt has had a CT Neck completed on 06/05/18 with results revealing Pathologic RIGHT-sided level II, III, IV, and V  lymphadenopathy, most consistent with metastatic carcinoma. An obvious primary source is not identified. Tissue sampling is warranted.  Most recent lab results (07/08/18) of CBC and CMP is as follows: all values are WNL except for PLT at 129k.  On review of systems, pt reports slightly more tired, neck nodules, some changes in swallowing, staying active, and denies fevers, chills, drenching night sweats, unexpected weight loss, abdominal pains, changes in bowel habits, skin rashes, leg swelling, CP, SOB, difficulty breathing, headaches, other lumps or bumps, skin lesions, mouth sores, pain along the spine, back pain, leg swelling, and any other symptoms.   On PMHx the pt reports localized Prostate cancer in 2008 s/p prostatectomy.  On Social Hx the pt reports that he quit smoking over 50 years ago. He notes that he consumes about 1 glass of wine every day, without concerns for excessive drinking. 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 He denies concern for chemical or radiation exposure. On Family Hx the pt reports wife with Stage IV Non Hodgkin's Lymphoma, paternal grandmother with leukemia in her 48s, father with unspecified abdominal cancer.   Interval History:   Mario Garcia returns today for management and evaluation of his recentlly diagnosed Diffuse Large B-Cell Lymphoma. The patient's last visit with [place] was on 03/13/2019. The pt reports that he is doing well overall.  The pt reports he is doing good. He reports his energy has  gotten better since treatment. Pt has gotten both doses of the COVID19 vaccine. He got his vaccine before his PET scan.   Of note since the patient's last visit, pt has had PET Skull Base to Thigh ([id number]) completed on 06/05/2019 with results revealing Small mildly hypermetabolic lymph nodes slightly increased metabolism compared to the previous study on the right in the right neck and at left level 2. Other areas are unchanged.  (Deauville 3/4. No new lesions. Right inguinal hernia containing nonobstructive bowel. Aortic Atherosclerosis (ICD10-I70.0).  Lab results today (06/12/19) of CBC w/diff and CMP is as follows: all values are WNL except for Glucose at 134, Calcium at 8.4. 06/12/2019 of LDH at 141  On review of systems, pt reports healthy appetite, sleeping well, and denies abdominal pain, back pain, fever, chills, night sweats, unexpected weight loss, no new lumps/bumps and any other symptoms.     MEDICAL HISTORY:  Past Medical History:  Diagnosis Date  . Cancer ([place])    PROSTATE  . History of colonic polyps   . History of kidney stones   . Hypertension   . Inguinal hernia 03/2002  . Pneumonia    "years ago"    SURGICAL HISTORY: Past Surgical History:  Procedure Laterality Date  . COLONOSCOPY    . DIRECT LARYNGOSCOPY N/A 07/08/2018   Procedure: DIRECT LARYNGOSCOPY;  Surgeon: Leta Baptist, MD;  Location: [facility];  Service: ENT;  Laterality: N/A;  . ESOPHAGOSCOPY N/A 07/08/2018   Procedure: ESOPHAGOSCOPY;  Surgeon: Leta Baptist, MD;  Location: [facility];  Service: ENT;  Laterality: N/A;  . FLEXIBLE BRONCHOSCOPY N/A 07/08/2018   Procedure: FLEXIBLE BRONCHOSCOPY;  Surgeon: Leta Baptist, MD;  Location: [place];  Service: ENT;  Laterality: N/A;  . HERNIA REPAIR Left    inguinal hernia  . IR IMAGING GUIDED PORT INSERTION  07/30/2018  . MASS BIOPSY Right 07/08/2018   Procedure: RIGHT NECK MASS EXCISIONAL BIOPSY;  Surgeon: Leta Baptist, MD;  Location: [facility];  Service: ENT;  Laterality: Right;  . PATELLA FRACTURE SURGERY Left   . PROSTATE SURGERY  10/2005   RADIAL PROSTATECTOMY  . ROTATOR CUFF REPAIR Bilateral     SOCIAL HISTORY: Social History   Socioeconomic History  . Marital status: Married    Spouse name: Not on file  . Number of children: Not on file  . Years of education: Not on file  . Highest education level: Not on file  Occupational History  . Not on file  Tobacco Use  . Smoking status: Former Research scientist (life sciences)  .  Smokeless tobacco: Never Used  . Tobacco comment: stopped 60 years ago  Substance and Sexual Activity  . Alcohol use: Yes    Alcohol/week: 6.0 standard drinks    Types: 6 Standard drinks or equivalent per week  . Drug use: No  . Sexual activity: Not Currently  Other Topics Concern  . Not on file  Social History Narrative  . Not on file   Social Determinants of Health   Financial Resource Strain:   . Difficulty of Paying Living Expenses:   Food Insecurity:   . Worried About Charity fundraiser in the Last Year:   . Arboriculturist in the Last Year:   Transportation Needs:   . Film/video editor (Medical):   Marland Kitchen Lack of Transportation (Non-Medical):   Physical Activity:   . Days of Exercise per Week:   . Minutes of Exercise per Session:   Stress:   . Feeling of Stress :  Social Connections:   . Frequency of Communication with Friends and Family:   . Frequency of Social Gatherings with Friends and Family:   . Attends Religious Services:   . Active Member of Clubs or Organizations:   . Attends Archivist Meetings:   Marland Kitchen Marital Status:   Intimate Partner Violence:   . Fear of Current or Ex-Partner:   . Emotionally Abused:   Marland Kitchen Physically Abused:   . Sexually Abused:     FAMILY HISTORY: Family History  Problem Relation Age of Onset  . Cancer Father   . Cancer Brother     ALLERGIES:  is allergic to lisinopril and penicillins.  MEDICATIONS:  Current Outpatient Medications  Medication Sig Dispense Refill  . allopurinol (ZYLOPRIM) 300 MG tablet Take 0.5 tablets (150 mg total) by mouth daily. (Patient not taking: Reported on 11/04/2018) 30 tablet 0  . Carboxymethylcellulose Sodium (ARTIFICIAL TEARS OP) Place 1 drop into both eyes daily as needed (dry eyes).    Marland Kitchen latanoprost (XALATAN) 0.005 % ophthalmic solution Place 1 drop into both eyes at bedtime.    . lidocaine-prilocaine (EMLA) cream Apply to affected area once 30 g 3  . losartan-hydrochlorothiazide  (HYZAAR) 50-12.5 MG tablet Take 1 tablet by mouth daily. 90 tablet 3  . ondansetron (ZOFRAN) 8 MG tablet Take 1 tablet (8 mg total) by mouth 2 (two) times daily as needed for refractory nausea / vomiting. Start on day 3 after cyclophosphamide chemotherapy. (Patient not taking: Reported on 11/04/2018) 30 tablet 1  . oxyCODONE-acetaminophen (PERCOCET) 5-325 MG tablet Take 1 tablet by mouth every 4 (four) hours as needed for severe pain. 20 tablet 0  . pravastatin (PRAVACHOL) 40 MG tablet Take 1 tablet by mouth once daily 90 tablet 0  . predniSONE (DELTASONE) 20 MG tablet Take 3 tablets (60 mg total) by mouth daily. Take on days 1-5 of chemotherapy. 15 tablet 6  . prochlorperazine (COMPAZINE) 10 MG tablet Take 1 tablet (10 mg total) by mouth every 6 (six) hours as needed (Nausea or vomiting). (Patient not taking: Reported on 09/11/2018) 30 tablet 6   No current facility-administered medications for this visit.    REVIEW OF SYSTEMS:   A 10+ POINT REVIEW OF SYSTEMS WAS OBTAINED including neurology, dermatology, psychiatry, cardiac, respiratory, lymph, extremities, GI, GU, Musculoskeletal, constitutional, breasts, reproductive, HEENT.  All pertinent positives are noted in the HPI.  All others are negative.     PHYSICAL EXAMINATION: ECOG FS:1 - Symptomatic but completely ambulatory  There were no vitals filed for this visit. Wt Readings from Last 3 Encounters:  03/13/19 168 lb (76.2 kg)  12/30/18 165 lb 1.6 oz (74.9 kg)  11/18/18 160 lb (72.6 kg)   Body mass index is 25.25 kg/m.    GENERAL:alert, in no acute distress and comfortable SKIN: no acute rashes, no significant lesions EYES: conjunctiva are pink and non-injected, sclera anicteric OROPHARYNX: MMM, no exudates, no oropharyngeal erythema or ulceration NECK: supple, no JVD LYMPH:  no palpable lymphadenopathy in the axillary or inguinal regions, palpable lymphadenopathy in cervical region-small LUNGS: clear to auscultation b/l with normal  respiratory effort HEART: regular rate & rhythm ABDOMEN:  normoactive bowel sounds , non tender, not distended. Extremity: no pedal edema PSYCH: alert & oriented x 3 with fluent speech NEURO: no focal motor/sensory deficits  LABORATORY DATA:  I have reviewed the data as listed  . CBC Latest Ref Rng & Units 03/13/2019 12/30/2018 11/18/2018  WBC 4.0 - 10.5 K/uL 5.7 5.5 6.0  Hemoglobin 13.0 -  17.0 g/dL 13.9 13.2 13.0  Hematocrit 39.0 - 52.0 % 42.9 40.1 39.7  Platelets 150 - 400 K/uL 139(L) 125(L) 167    . CMP Latest Ref Rng & Units 03/13/2019 12/30/2018 11/18/2018  Glucose 70 - 99 mg/dL 109(H) 115(H) 96  BUN 8 - 23 mg/dL _0 Creatinine 0.61 - 1.24 mg/dL 0.82 0.86 0.80  Sodium 135 - 145 mmol/L 141 140 139  Potassium 3.5 - 5.1 mmol/L 3.7 3.5 3.6  Chloride 98 - 111 mmol/L 104 104 104  CO2 22 - 32 mmol/L _1 Calcium 8.9 - 10.3 mg/dL 8.9 8.9 9.0  Total Protein 6.5 - 8.1 g/dL 7.1 6.6 6.8  Total Bilirubin 0.3 - 1.2 mg/dL 0.4 0.6 0.4  Alkaline Phos 38 - 126 U/L 65 66 71  AST 15 - 41 U/L _2 ALT 0 - 44 U/L _3 . Lab Results  Component Value Date   LDH 153 03/13/2019   03/03/2019 NM PET Image Restag (PS) Skull Base To Thigh (Accession [id number]):   07/08/18 Right Cervical Tissue Biopsy:    RADIOGRAPHIC STUDIES: I have personally reviewed the radiological images as listed and agreed with the findings in the report. NM PET Image Restag (PS) Skull Base To Thigh  Result Date: 06/05/2019 CLINICAL DATA:  Subsequent treatment strategy for large B-cell lymphoma of the neck. EXAM: NUCLEAR MEDICINE PET SKULL BASE TO THIGH TECHNIQUE: 8.3 mCi F-18 FDG was injected intravenously. Full-ring PET imaging was performed from the skull base to thigh after the radiotracer. CT data was obtained and used for attenuation correction and anatomic localization. Fasting blood glucose: 97 mg/dl COMPARISON:  03/03/2019 and multiple priors FINDINGS: Mediastinal blood pool activity: SUV  max 2.2 Liver activity: SUV max 2.8 NECK: Bilateral small lymph nodes with increased FDG uptake. Index level 2 node on the right measuring approximately 7 mm previously 8 mm (SUVmax = 5.8) previously 5.1 Small level 2 B lymph node on the right at nearly the same level (SUVmax = 5.5) previously 3.3 stable size at approximately 6 mm. Close proximity of many of these lymph nodes does limit localization. Posterior triangle lymph node at nearly the same level as above nodes with similar FDG uptake. Single left level 2 lymph node difficult to visualize due to streak artifact at the level of the left mandible measuring approximately 5.4 mm with uptake more similar to the study of 12/23/2018, slightly increased compared to the more recent comparison evaluation where was closer to blood pool. Incidental CT findings: none CHEST: Small hypermetabolic left subpectoral lymph node 7 mm (image 59, series 3 and series 4 (SUVmax = 3.1) metabolic activity unchanged from previous exam. Other small lymph nodes with similar appearance. Incidental CT findings: Left sided Port-A-Cath terminates at the caval to atrial junction. Signs scattered atherosclerosis. No signs of aneurysm. Calcified coronary artery disease. No pericardial effusion. ABDOMEN/PELVIS: Mildly hypermetabolic portacaval lymph node is stable in size (SUVmax = 3.9) previously 4.2 Small retroperitoneal lymph nodes are similar. Indexed intra-aortocaval lymph node measures 6 mm (image 144, series 4 (SUVmax = 2.9) 3.9 Indexed left external iliac lymph node (image 169, series 4) 7 mm mm (SUVmax = 4.4) previously 5.8. Left external iliac/pelvic sidewall lymph node 8 mm (image 179, series 4) (SUVmax = 6.2) previously 5.8 other scattered lymph nodes with similar FDG uptake to this area showing uptake between 4 and 5 Incidental CT findings: Liver, spleen, pancreas, adrenal glands and kidneys with normal CT appearance. Calcified  atherosclerotic changes of the abdominal aorta.  Haziness of central mesenteric structures with similar appearance. Right inguinal hernia contains small bowel, no signs of obstruction. The appendix is normal. SKELETON: No focal hypermetabolic activity to suggest skeletal metastasis. Incidental CT findings: Degenerative changes of the spine. IMPRESSION: Small mildly hypermetabolic lymph nodes slightly increased metabolism compared to the previous study on the right in the right neck and at left level 2. Other areas are unchanged. (Deauville 3/4. No new lesions. Right inguinal hernia containing nonobstructive bowel. Aortic Atherosclerosis (ICD10-I70.0). Electronically Signed   By: Zetta Bills M.D.   On: 06/05/2019 11:18    ASSESSMENT & PLAN:   86 y.o. male with  1. Diffuse Large B-Cell Lymphoma, Stage IV Presenting without constitutional symptoms. Palpable right cervical and supraclavicular lymphadenopathy.   07/08/18 Right cervical soft tissue biopsy revealed Diffuse Large B-Cell Lymphoma, germinal center type   06/05/18 CT Neck revealed Pathologic RIGHT-sided level II, III, IV, and V lymphadenopathy, most consistent with metastatic carcinoma. An obvious primary source is not identified. Tissue sampling is warranted.  07/16/18 Hep B and Hep C negative  07/17/18 ECHO revealed LV EF of 60-65%  07/22/18 PET/CT revealed "Hypermetabolic adenopathy especially concentrated in the right neck but also in the left neck, chest, abdomen, and pelvis. The adenopathy is primarily Deauville 5 although some few scattered smaller lesions are Deauville 4. 2. Diffuse abnormal splenic activity, Deauville 5, without overt splenomegaly. 3. There is also hypermetabolic Deauville 5 activity in the mildly thickened distal appendix, and raising suspicion for involvement of the appendix. 4. Other imaging findings of potential clinical significance: Aortic Atherosclerosis. Coronary atherosclerosis."  10/03/2018 PET scan revealed "Interval response to therapy with stable to  decreased size of lymph nodes on CT and generalized decrease in hypermetabolism of the abnormal nodes. Hypermetabolism in the lymph nodes today is compatible with a combination of Deauville 3 and Deauville 4 disease. Stable tiny bilateral pulmonary nodules. Increase radiotracer accumulation in the marrow space on today's study, presumably representing marrow stimulatory effects of therapy."   PLAN: -Discussed pt labwork today, 06/12/19; of CBC w/diff and CMP is as follows: all values are WNL except for Glucose at 134, Calcium at 8.4. -Discussed 06/12/2019 of LDH at 141 -Discussed 06/05/2019 of PET Skull Base to Thigh ([id number])  -Discussed how COVID19 vaccine could affect lymph nodes in PET Skull Base to Thigh ([id number]) -Discussed biopsy options, lymph nodes are  Deauville 3/4up but are very small and have not changed much -Possibility for biopsy vs continuing watching for indeterminate finding -Not showing progression over 4 months to suggest against residual Diffuse Large B-Cell Lymphoma -Pt would like to continue watching   -F/u with  PCP for hernia pain -Will see back in 3 months with Labs unless new symptoms arose   FOLLOW UP: RTC with Dr Irene Limbo with portflush and labs in 3 months   The total time spent in the appt was 20 minutes and more than 50% was on counseling and direct patient cares.  All of the patient's questions were answered with apparent satisfaction. The patient knows to call the clinic with any problems, questions or concerns.     Sullivan Lone MD [place] AAHIVMS [facility] Hematology/Oncology Physician [facility]  (Office):       [phone] (Work cell):  [phone] (Fax):           [phone]  06/11/2019 8:36 PM  I, Dawayne Cirri am acting as a scribe for Dr. Sullivan Lone.   .I have reviewed  the above documentation for accuracy and completeness, and I agree with the above. Brunetta Genera MD

## 2019-06-12 ENCOUNTER — Inpatient Hospital Stay: Payer: Medicare Other

## 2019-06-12 ENCOUNTER — Inpatient Hospital Stay: Payer: Medicare Other | Attending: Hematology | Admitting: Hematology

## 2019-06-12 ENCOUNTER — Other Ambulatory Visit: Payer: Self-pay

## 2019-06-12 VITALS — BP 135/68 | HR 64 | Temp 98.2°F | Resp 18 | Ht 69.0 in | Wt 171.0 lb

## 2019-06-12 DIAGNOSIS — Z95828 Presence of other vascular implants and grafts: Secondary | ICD-10-CM

## 2019-06-12 DIAGNOSIS — C8331 Diffuse large B-cell lymphoma, lymph nodes of head, face, and neck: Secondary | ICD-10-CM

## 2019-06-12 DIAGNOSIS — Z8546 Personal history of malignant neoplasm of prostate: Secondary | ICD-10-CM | POA: Diagnosis not present

## 2019-06-12 LAB — CMP (CANCER CENTER ONLY)
ALT: 13 U/L (ref 0–44)
AST: 18 U/L (ref 15–41)
Albumin: 3.9 g/dL (ref 3.5–5.0)
Alkaline Phosphatase: 59 U/L (ref 38–126)
Anion gap: 10 (ref 5–15)
BUN: 14 mg/dL (ref 8–23)
CO2: 27 mmol/L (ref 22–32)
Calcium: 8.4 mg/dL — ABNORMAL LOW (ref 8.9–10.3)
Chloride: 102 mmol/L (ref 98–111)
Creatinine: 0.83 mg/dL (ref 0.61–1.24)
GFR, Est AFR Am: >60 mL/min
GFR, Estimated: >60 mL/min
Glucose, Bld: 134 mg/dL — ABNORMAL HIGH (ref 70–99)
Potassium: 3.5 mmol/L (ref 3.5–5.1)
Sodium: 139 mmol/L (ref 135–145)
Total Bilirubin: 0.6 mg/dL (ref 0.3–1.2)
Total Protein: 6.9 g/dL (ref 6.5–8.1)

## 2019-06-12 LAB — CBC WITH DIFFERENTIAL/PLATELET
Abs Immature Granulocytes: 0.03 K/uL (ref 0.00–0.07)
Basophils Absolute: 0 K/uL (ref 0.0–0.1)
Basophils Relative: 0 %
Eosinophils Absolute: 0.1 K/uL (ref 0.0–0.5)
Eosinophils Relative: 2 %
HCT: 42.6 % (ref 39.0–52.0)
Hemoglobin: 14.1 g/dL (ref 13.0–17.0)
Immature Granulocytes: 1 %
Lymphocytes Relative: 37 %
Lymphs Abs: 2.1 K/uL (ref 0.7–4.0)
MCH: 30.3 pg (ref 26.0–34.0)
MCHC: 33.1 g/dL (ref 30.0–36.0)
MCV: 91.6 fL (ref 80.0–100.0)
Monocytes Absolute: 0.5 K/uL (ref 0.1–1.0)
Monocytes Relative: 10 %
Neutro Abs: 2.9 K/uL (ref 1.7–7.7)
Neutrophils Relative %: 50 %
Platelets: 150 K/uL (ref 150–400)
RBC: 4.65 MIL/uL (ref 4.22–5.81)
RDW: 12.9 % (ref 11.5–15.5)
WBC: 5.6 K/uL (ref 4.0–10.5)
nRBC: 0 % (ref 0.0–0.2)

## 2019-06-12 LAB — LACTATE DEHYDROGENASE: LDH: 141 U/L (ref 98–192)

## 2019-06-12 MED ORDER — HEPARIN SOD (PORK) LOCK FLUSH 100 UNIT/ML IV SOLN
500.0000 [IU] | Freq: Once | INTRAVENOUS | Status: AC | PRN
  Administered 2019-06-12: 500 [IU]
  Filled 2019-06-12: qty 5

## 2019-06-12 MED ORDER — SODIUM CHLORIDE 0.9% FLUSH
10.0000 mL | INTRAVENOUS | Status: DC | PRN
  Administered 2019-06-12: 10 mL
  Filled 2019-06-12: qty 10

## 2019-06-16 ENCOUNTER — Telehealth: Payer: Self-pay | Admitting: Hematology

## 2019-06-16 NOTE — Telephone Encounter (Signed)
Scheduled per 03/18 los, patient has been called and voicemail was left.

## 2019-08-05 ENCOUNTER — Other Ambulatory Visit: Payer: Self-pay | Admitting: Family Medicine

## 2019-08-05 DIAGNOSIS — E785 Hyperlipidemia, unspecified: Secondary | ICD-10-CM

## 2019-08-08 ENCOUNTER — Ambulatory Visit (INDEPENDENT_AMBULATORY_CARE_PROVIDER_SITE_OTHER): Payer: Medicare Other | Admitting: Family Medicine

## 2019-08-08 ENCOUNTER — Other Ambulatory Visit: Payer: Self-pay

## 2019-08-08 ENCOUNTER — Encounter: Payer: Self-pay | Admitting: Family Medicine

## 2019-08-08 VITALS — BP 120/60 | HR 60 | Temp 97.8°F | Wt 171.8 lb

## 2019-08-08 DIAGNOSIS — Z87442 Personal history of urinary calculi: Secondary | ICD-10-CM | POA: Diagnosis not present

## 2019-08-08 DIAGNOSIS — R200 Anesthesia of skin: Secondary | ICD-10-CM | POA: Diagnosis not present

## 2019-08-08 DIAGNOSIS — Z131 Encounter for screening for diabetes mellitus: Secondary | ICD-10-CM

## 2019-08-08 DIAGNOSIS — Z8546 Personal history of malignant neoplasm of prostate: Secondary | ICD-10-CM | POA: Diagnosis not present

## 2019-08-08 DIAGNOSIS — E785 Hyperlipidemia, unspecified: Secondary | ICD-10-CM

## 2019-08-08 DIAGNOSIS — Z95828 Presence of other vascular implants and grafts: Secondary | ICD-10-CM

## 2019-08-08 DIAGNOSIS — C8331 Diffuse large B-cell lymphoma, lymph nodes of head, face, and neck: Secondary | ICD-10-CM

## 2019-08-08 DIAGNOSIS — R739 Hyperglycemia, unspecified: Secondary | ICD-10-CM | POA: Diagnosis not present

## 2019-08-08 DIAGNOSIS — I10 Essential (primary) hypertension: Secondary | ICD-10-CM | POA: Diagnosis not present

## 2019-08-08 DIAGNOSIS — R202 Paresthesia of skin: Secondary | ICD-10-CM

## 2019-08-08 LAB — POCT GLYCOSYLATED HEMOGLOBIN (HGB A1C): Hemoglobin A1C: 5.6 % (ref 4.0–5.6)

## 2019-08-08 NOTE — Progress Notes (Signed)
   Subjective:    Patient ID: Mario Garcia, male    DOB: 1931-05-12, 86 y.o.   MRN: [id number]  HPI He is here for a med check appointment.  He does complain of a tingling sensation especially in the right wrist at night and points mainly to the median nerve distribution.  He does have hypertension and is doing well on his present medication.  He does have concerns of a statin causing some memory issues.  He is taking Pravachol.  He does use eyedrops.  He does have a hearing aid in on one side.  No history of renal stones recently.  He has a remote history of prostate cancer.  He does not complain of any urinary symptoms but erections are not occurring.  He does get regular follow-up with oncology concerning his B-cell lymphoma.  He still has a Port-A-Cath in place and is looking to possibly get that removed.  Review of recent blood work did show elevated blood sugar.   Review of Systems     Objective:   Physical Exam Alert and in no distress. Tympanic membranes and canals are normal. Pharyngeal area is normal. Neck is supple without adenopathy or thyromegaly. Cardiac exam shows a regular sinus rhythm without murmurs or gallops. Lungs are clear to auscultation. Hemoglobin A1c is 5.6       Assessment & Plan:  Diffuse large B-cell lymphoma of lymph nodes of neck (HCC)  Port-A-Cath in place  History of prostate cancer  Essential hypertension  Hyperlipidemia with target LDL less than 100 - Plan: Lipid panel  History of renal stone  Screening for diabetes mellitus - Plan: HgB A1c  Elevated blood sugar - Plan: HgB A1c  Numbness and tingling in right hand I discussed that the numbness in his hands is probably carpal tunnel related.  Discussed options including keeping his wrist at neutral position and possibly using a wrist splint. He will continue on his blood pressure medication.  Discussed possibly stopping the Pravachol for a short period of time to see if it has an effect on his  mental functioning.  Discussed the fact that this is probably an aging process rather than truly related to medication.  He will keep me informed concerning this.

## 2019-08-09 LAB — LIPID PANEL
Chol/HDL Ratio: 4.5 ratio (ref 0.0–5.0)
Cholesterol, Total: 197 mg/dL (ref 100–199)
HDL: 44 mg/dL (ref >39)
LDL Chol Calc (NIH): 112 mg/dL — ABNORMAL HIGH (ref 0–99)
Triglycerides: 234 mg/dL — ABNORMAL HIGH (ref 0–149)
VLDL Cholesterol Cal: 41 mg/dL — ABNORMAL HIGH (ref 5–40)

## 2019-09-10 NOTE — Progress Notes (Signed)
HEMATOLOGY/ONCOLOGY CLINIC NOTE  Date of Service: 09/11/2019  Patient Care Team: Denita Lung, MD as PCP - General (Family Medicine)  CHIEF COMPLAINTS/PURPOSE OF CONSULTATION:  Diffuse Large B-Cell Lymphoma  HISTORY OF PRESENTING ILLNESS:   Mario Garcia is a wonderful 86 y.o. male who has been referred to [place] by Dr. Jill Alexanders for evaluation and management of Diffuse Large B-Cell Lymphoma. The pt reports that he is doing well overall.   The pt reports that he feels that he has been a little more tired recently. He first noticed some "nodules" on his neck appear "4-6 weeks ago." He notes that these nodules haven't been painful. He appears to have presented to care with his PCP on 05/28/18, Dr. Redmond School. He notes that he has noticed that he has been sweating more in the last year. He denies noticing any other new lumps or bumps. He denies any fevers, chills, drenching night sweats, or unexpected weight loss. He notes that he has had some changes in swallowing over the last 6 months, which he characterizes as "getting strangled on his saliva every now and then." He denies abdominal pains, acid reflux, changes in bowel habits, leg swelling, skin rashes. He endorses a deep pain "every once in a while," in his left groin. He denies headaches. He endorses glaucoma and a history of cataracts. He sees Dr. Hetty Blend in ophthalmology. He denies any other concern in the last 6 months.  The pt notes that he lives independently with his wife and still is able to do all he wants to do. He walks on trails with his wife and notes that he can generally walk as far as he likes to.  The pt notes that he had prostate cancer in 2008 or 2009, s/p prostatectomy. He did not require RT nor systemic treatments. He denies lung, heart or kidney problems.  Of note prior to the patient's visit today, pt has had a CT Neck completed on 06/05/18 with results revealing Pathologic RIGHT-sided level II, III, IV, and V  lymphadenopathy, most consistent with metastatic carcinoma. An obvious primary source is not identified. Tissue sampling is warranted.  Most recent lab results (07/08/18) of CBC and CMP is as follows: all values are WNL except for PLT at 129k.  On review of systems, pt reports slightly more tired, neck nodules, some changes in swallowing, staying active, and denies fevers, chills, drenching night sweats, unexpected weight loss, abdominal pains, changes in bowel habits, skin rashes, leg swelling, CP, SOB, difficulty breathing, headaches, other lumps or bumps, skin lesions, mouth sores, pain along the spine, back pain, leg swelling, and any other symptoms.   On PMHx the pt reports localized Prostate cancer in 2008 s/p prostatectomy.  On Social Hx the pt reports that he quit smoking over 50 years ago. He notes that he consumes about 1 glass of wine every day, without concerns for excessive drinking. 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 He denies concern for chemical or radiation exposure. On Family Hx the pt reports wife with Stage IV Non Hodgkin's Lymphoma, paternal grandmother with leukemia in her 39s, father with unspecified abdominal cancer.   Interval History:   Mario Garcia returns today for management and evaluation of his recentlly diagnosed Diffuse Large B-Cell Lymphoma. The patient's last visit with [place] was on 06/12/19. The pt reports that he is doing well overall.  The pt reports he is good. He has gotten both doses of  the COVID19 vaccine. Pt easily fatigues but attributes it to age. He is staying active and staying busy. Pt drinks about 3-4 cups of coffee a day and sometimes has abdominal pain but attributes it to the coffee.   Lab results today (09/11/19) of CBC w/diff and CMP is as follows: all values are WNL except for Platelets at 141K, Calcium at 8.7 09/11/19 of LDH at 186: WNL  On review of systems, pt reports fatigue, healthy appetite and denies  fevers, chills, night sweats, unexpected weight loss, new lumps/bumps, chest pain, SOB, abdominal pain, irregular bowl movement, testicular pain or swelling and any other symptoms.   MEDICAL HISTORY:  Past Medical History:  Diagnosis Date  . Cancer ([place])    PROSTATE  . History of colonic polyps   . History of kidney stones   . Hypertension   . Inguinal hernia 03/2002  . Pneumonia    "years ago"    SURGICAL HISTORY: Past Surgical History:  Procedure Laterality Date  . COLONOSCOPY    . DIRECT LARYNGOSCOPY N/A 07/08/2018   Procedure: DIRECT LARYNGOSCOPY;  Surgeon: Leta Baptist, MD;  Location: [facility];  Service: ENT;  Laterality: N/A;  . ESOPHAGOSCOPY N/A 07/08/2018   Procedure: ESOPHAGOSCOPY;  Surgeon: Leta Baptist, MD;  Location: [place];  Service: ENT;  Laterality: N/A;  . FLEXIBLE BRONCHOSCOPY N/A 07/08/2018   Procedure: FLEXIBLE BRONCHOSCOPY;  Surgeon: Leta Baptist, MD;  Location: [place];  Service: ENT;  Laterality: N/A;  . HERNIA REPAIR Left    inguinal hernia  . IR IMAGING GUIDED PORT INSERTION  07/30/2018  . MASS BIOPSY Right 07/08/2018   Procedure: RIGHT NECK MASS EXCISIONAL BIOPSY;  Surgeon: Leta Baptist, MD;  Location: [facility];  Service: ENT;  Laterality: Right;  . PATELLA FRACTURE SURGERY Left   . PROSTATE SURGERY  10/2005   RADIAL PROSTATECTOMY  . ROTATOR CUFF REPAIR Bilateral     SOCIAL HISTORY: Social History   Socioeconomic History  . Marital status: Married    Spouse name: Not on file  . Number of children: Not on file  . Years of education: Not on file  . Highest education level: Not on file  Occupational History  . Not on file  Tobacco Use  . Smoking status: Former Research scientist (life sciences)  . Smokeless tobacco: Never Used  . Tobacco comment: stopped 60 years ago  Vaping Use  . Vaping Use: Never used  Substance and Sexual Activity  . Alcohol use: Yes    Alcohol/week: 6.0 standard drinks    Types: 6 Standard drinks or equivalent per week  . Drug use: No  . Sexual activity: Not Currently   Other Topics Concern  . Not on file  Social History Narrative  . Not on file   Social Determinants of Health   Financial Resource Strain:   . Difficulty of Paying Living Expenses:   Food Insecurity:   . Worried About Charity fundraiser in the Last Year:   . Arboriculturist in the Last Year:   Transportation Needs:   . Film/video editor (Medical):   Marland Kitchen Lack of Transportation (Non-Medical):   Physical Activity:   . Days of Exercise per Week:   . Minutes of Exercise per Session:   Stress:   . Feeling of Stress :   Social Connections:   . Frequency of Communication with Friends and Family:   . Frequency of Social Gatherings with Friends and Family:   . Attends Religious Services:   . Active Member  of Clubs or Organizations:   . Attends Archivist Meetings:   Marland Kitchen Marital Status:   Intimate Partner Violence:   . Fear of Current or Ex-Partner:   . Emotionally Abused:   Marland Kitchen Physically Abused:   . Sexually Abused:     FAMILY HISTORY: Family History  Problem Relation Age of Onset  . Cancer Father   . Cancer Brother     ALLERGIES:  is allergic to lisinopril and penicillins.  MEDICATIONS:  Current Outpatient Medications  Medication Sig Dispense Refill  . Carboxymethylcellulose Sodium (ARTIFICIAL TEARS OP) Place 1 drop into both eyes daily as needed (dry eyes).    Marland Kitchen latanoprost (XALATAN) 0.005 % ophthalmic solution Place 1 drop into both eyes at bedtime.    . lidocaine-prilocaine (EMLA) cream Apply to affected area once 30 g 3  . losartan-hydrochlorothiazide (HYZAAR) 50-12.5 MG tablet Take 1 tablet by mouth daily. 90 tablet 3  . pravastatin (PRAVACHOL) 40 MG tablet Take 1 tablet by mouth once daily 90 tablet 0   No current facility-administered medications for this visit.    REVIEW OF SYSTEMS:   A 10+ POINT REVIEW OF SYSTEMS WAS OBTAINED including neurology, dermatology, psychiatry, cardiac, respiratory, lymph, extremities, GI, GU, Musculoskeletal,  constitutional, breasts, reproductive, HEENT.  All pertinent positives are noted in the HPI.  All others are negative.   PHYSICAL EXAMINATION: ECOG FS:1 - Symptomatic but completely ambulatory  Vitals:   09/11/19 1432  BP: (!) 147/69  Pulse: 62  Resp: 16  Temp: (!) 97.2 F (36.2 C)  SpO2: 98%   Wt Readings from Last 3 Encounters:  09/11/19 172 lb 8 oz (78.2 kg)  08/08/19 171 lb 12.8 oz (77.9 kg)  06/12/19 171 lb (77.6 kg)   Body mass index is 25.47 kg/m.    GENERAL:alert, in no acute distress and comfortable SKIN: no acute rashes, no significant lesions EYES: conjunctiva are pink and non-injected, sclera anicteric OROPHARYNX: MMM, no exudates, no oropharyngeal erythema or ulceration NECK: supple, no JVD LYMPH:  no palpable lymphadenopathy in the cervical, axillary or inguinal regions LUNGS: clear to auscultation b/l with normal respiratory effort HEART: regular rate & rhythm ABDOMEN:  normoactive bowel sounds , non tender, not distended. Extremity: no pedal edema PSYCH: alert & oriented x 3 with fluent speech NEURO: no focal motor/sensory deficits  LABORATORY DATA:  I have reviewed the data as listed  . CBC Latest Ref Rng & Units 09/11/2019 06/12/2019 03/13/2019  WBC 4.0 - 10.5 K/uL 6.7 5.6 5.7  Hemoglobin 13.0 - 17.0 g/dL 13.9 14.1 13.9  Hematocrit 39 - 52 % 41.7 42.6 42.9  Platelets 150 - 400 K/uL 141(L) 150 139(L)    . CMP Latest Ref Rng & Units 09/11/2019 06/12/2019 03/13/2019  Glucose 70 - 99 mg/dL 88 134(H) 109(H)  BUN 8 - 23 mg/dL _0 Creatinine 0.61 - 1.24 mg/dL 0.92 0.83 0.82  Sodium 135 - 145 mmol/L 140 139 141  Potassium 3.5 - 5.1 mmol/L 3.5 3.5 3.7  Chloride 98 - 111 mmol/L 104 102 104  CO2 22 - 32 mmol/L _1 Calcium 8.9 - 10.3 mg/dL 8.7(L) 8.4(L) 8.9  Total Protein 6.5 - 8.1 g/dL 7.3 6.9 7.1  Total Bilirubin 0.3 - 1.2 mg/dL 0.6 0.6 0.4  Alkaline Phos 38 - 126 U/L 67 59 65  AST 15 - 41 U/L _2 ALT 0 - 44 U/L _3 . Lab  Results  Component Value Date  LDH 186 09/11/2019   03/03/2019 NM PET Image Restag (PS) Skull Base To Thigh (Accession [id number]):   07/08/18 Right Cervical Tissue Biopsy:    RADIOGRAPHIC STUDIES: I have personally reviewed the radiological images as listed and agreed with the findings in the report. No results found.  ASSESSMENT & PLAN:   86 y.o. male with  1. Diffuse Large B-Cell Lymphoma, Stage IV Presenting without constitutional symptoms. Palpable right cervical and supraclavicular lymphadenopathy.   07/08/18 Right cervical soft tissue biopsy revealed Diffuse Large B-Cell Lymphoma, germinal center type   06/05/18 CT Neck revealed Pathologic RIGHT-sided level II, III, IV, and V lymphadenopathy, most consistent with metastatic carcinoma. An obvious primary source is not identified. Tissue sampling is warranted.  07/16/18 Hep B and Hep C negative  07/17/18 ECHO revealed LV EF of 60-65%  07/22/18 PET/CT revealed "Hypermetabolic adenopathy especially concentrated in the right neck but also in the left neck, chest, abdomen, and pelvis. The adenopathy is primarily Deauville 5 although some few scattered smaller lesions are Deauville 4. 2. Diffuse abnormal splenic activity, Deauville 5, without overt splenomegaly. 3. There is also hypermetabolic Deauville 5 activity in the mildly thickened distal appendix, and raising suspicion for involvement of the appendix. 4. Other imaging findings of potential clinical significance: Aortic Atherosclerosis. Coronary atherosclerosis."  10/03/2018 PET scan revealed "Interval response to therapy with stable to decreased size of lymph nodes on CT and generalized decrease in hypermetabolism of the abnormal nodes. Hypermetabolism in the lymph nodes today is compatible with a combination of Deauville 3 and Deauville 4 disease. Stable tiny bilateral pulmonary nodules. Increase radiotracer accumulation in the marrow space on today's study, presumably  representing marrow stimulatory effects of therapy."   PLAN: -Discussed pt labwork today, 09/11/19; of CBC w/diff and CMP is as follows: all values are WNL except for Platelets at 141K, Calcium at 8.7 -Discussed 09/11/19 of LDH at 186: WNL -Advised will leave port in for now  -no lab or clinical evidence of progression of Diffuse Large B-Cell Lymphoma -Pt would like to continue watching   -Will repeat scans in 6 months  -Will see back in 3 months with Labs unless new symptoms arose   FOLLOW UP: RTC with Dr Irene Limbo with portflush and labs in 3 months  The total time spent in the appt was 20 minutes and more than 50% was on counseling and direct patient cares.  All of the patient's questions were answered with apparent satisfaction. The patient knows to call the clinic with any problems, questions or concerns.   Sullivan Lone MD [place] AAHIVMS [facility] Hematology/Oncology Physician [facility]  (Office):       [phone] (Work cell):  [phone] (Fax):           [phone]  09/11/2019 3:58 PM  I, Dawayne Cirri am acting as a scribe for Dr. Sullivan Lone.   .I have reviewed the above documentation for accuracy and completeness, and I agree with the above. Brunetta Genera MD

## 2019-09-11 ENCOUNTER — Inpatient Hospital Stay (HOSPITAL_BASED_OUTPATIENT_CLINIC_OR_DEPARTMENT_OTHER): Payer: Medicare Other | Admitting: Hematology

## 2019-09-11 ENCOUNTER — Other Ambulatory Visit: Payer: Self-pay

## 2019-09-11 ENCOUNTER — Inpatient Hospital Stay: Payer: Medicare Other

## 2019-09-11 ENCOUNTER — Inpatient Hospital Stay: Payer: Medicare Other | Attending: Hematology

## 2019-09-11 VITALS — BP 147/69 | HR 62 | Temp 97.2°F | Resp 16 | Wt 172.5 lb

## 2019-09-11 DIAGNOSIS — Z7289 Other problems related to lifestyle: Secondary | ICD-10-CM | POA: Insufficient documentation

## 2019-09-11 DIAGNOSIS — E785 Hyperlipidemia, unspecified: Secondary | ICD-10-CM | POA: Insufficient documentation

## 2019-09-11 DIAGNOSIS — Z87442 Personal history of urinary calculi: Secondary | ICD-10-CM | POA: Insufficient documentation

## 2019-09-11 DIAGNOSIS — C8331 Diffuse large B-cell lymphoma, lymph nodes of head, face, and neck: Secondary | ICD-10-CM | POA: Diagnosis not present

## 2019-09-11 DIAGNOSIS — Z79899 Other long term (current) drug therapy: Secondary | ICD-10-CM | POA: Insufficient documentation

## 2019-09-11 DIAGNOSIS — Z809 Family history of malignant neoplasm, unspecified: Secondary | ICD-10-CM | POA: Diagnosis not present

## 2019-09-11 DIAGNOSIS — I10 Essential (primary) hypertension: Secondary | ICD-10-CM | POA: Diagnosis not present

## 2019-09-11 DIAGNOSIS — Z8546 Personal history of malignant neoplasm of prostate: Secondary | ICD-10-CM | POA: Diagnosis not present

## 2019-09-11 DIAGNOSIS — Z806 Family history of leukemia: Secondary | ICD-10-CM | POA: Diagnosis not present

## 2019-09-11 DIAGNOSIS — Z95828 Presence of other vascular implants and grafts: Secondary | ICD-10-CM

## 2019-09-11 DIAGNOSIS — Z86010 Personal history of colonic polyps: Secondary | ICD-10-CM | POA: Diagnosis not present

## 2019-09-11 DIAGNOSIS — I700 Atherosclerosis of aorta: Secondary | ICD-10-CM | POA: Diagnosis not present

## 2019-09-11 LAB — CMP (CANCER CENTER ONLY)
ALT: 18 U/L (ref 0–44)
AST: 25 U/L (ref 15–41)
Albumin: 4 g/dL (ref 3.5–5.0)
Alkaline Phosphatase: 67 U/L (ref 38–126)
Anion gap: 12 (ref 5–15)
BUN: 15 mg/dL (ref 8–23)
CO2: 24 mmol/L (ref 22–32)
Calcium: 8.7 mg/dL — ABNORMAL LOW (ref 8.9–10.3)
Chloride: 104 mmol/L (ref 98–111)
Creatinine: 0.92 mg/dL (ref 0.61–1.24)
GFR, Est AFR Am: >60 mL/min (ref >60)
GFR, Estimated: >60 mL/min (ref >60)
Glucose, Bld: 88 mg/dL (ref 70–99)
Potassium: 3.5 mmol/L (ref 3.5–5.1)
Sodium: 140 mmol/L (ref 135–145)
Total Bilirubin: 0.6 mg/dL (ref 0.3–1.2)
Total Protein: 7.3 g/dL (ref 6.5–8.1)

## 2019-09-11 LAB — CBC WITH DIFFERENTIAL/PLATELET
Abs Immature Granulocytes: 0.02 10*3/uL (ref 0.00–0.07)
Basophils Absolute: 0 10*3/uL (ref 0.0–0.1)
Basophils Relative: 0 %
Eosinophils Absolute: 0.1 10*3/uL (ref 0.0–0.5)
Eosinophils Relative: 1 %
HCT: 41.7 % (ref 39.0–52.0)
Hemoglobin: 13.9 g/dL (ref 13.0–17.0)
Immature Granulocytes: 0 %
Lymphocytes Relative: 31 %
Lymphs Abs: 2.1 10*3/uL (ref 0.7–4.0)
MCH: 30.5 pg (ref 26.0–34.0)
MCHC: 33.3 g/dL (ref 30.0–36.0)
MCV: 91.4 fL (ref 80.0–100.0)
Monocytes Absolute: 0.8 10*3/uL (ref 0.1–1.0)
Monocytes Relative: 11 %
Neutro Abs: 3.8 10*3/uL (ref 1.7–7.7)
Neutrophils Relative %: 57 %
Platelets: 141 10*3/uL — ABNORMAL LOW (ref 150–400)
RBC: 4.56 MIL/uL (ref 4.22–5.81)
RDW: 12.8 % (ref 11.5–15.5)
WBC: 6.7 10*3/uL (ref 4.0–10.5)
nRBC: 0 % (ref 0.0–0.2)

## 2019-09-11 LAB — LACTATE DEHYDROGENASE: LDH: 186 U/L (ref 98–192)

## 2019-09-11 MED ORDER — SODIUM CHLORIDE 0.9% FLUSH
10.0000 mL | INTRAVENOUS | Status: DC | PRN
  Administered 2019-09-11: 10 mL
  Filled 2019-09-11: qty 10

## 2019-09-11 MED ORDER — HEPARIN SOD (PORK) LOCK FLUSH 100 UNIT/ML IV SOLN
500.0000 [IU] | Freq: Once | INTRAVENOUS | Status: AC | PRN
  Administered 2019-09-11: 500 [IU]
  Filled 2019-09-11: qty 5

## 2019-11-01 ENCOUNTER — Other Ambulatory Visit: Payer: Self-pay | Admitting: Family Medicine

## 2019-11-01 DIAGNOSIS — I10 Essential (primary) hypertension: Secondary | ICD-10-CM

## 2019-11-03 NOTE — Telephone Encounter (Signed)
Called pt to advise of the need to call and set up a med check appt . [place]

## 2019-11-04 ENCOUNTER — Telehealth: Payer: Self-pay

## 2019-11-04 NOTE — Telephone Encounter (Signed)
Patient called to inquire about whether he was charged for a visit on 11/01/19 as he is questioning due to viewing his mychart. Patient was informed that was not a visit, it was for an electronic medication refill request. He was also informed that he needed to schedule a med check. Patient refused, stating he had a med check in May. Patient was advised that the appointment in May was for an acute visit. Patient still did not want to schedule. This is Pharmacist, hospital

## 2019-11-27 ENCOUNTER — Other Ambulatory Visit: Payer: Self-pay | Admitting: Family Medicine

## 2019-11-27 DIAGNOSIS — E785 Hyperlipidemia, unspecified: Secondary | ICD-10-CM

## 2019-12-11 ENCOUNTER — Telehealth: Payer: Self-pay | Admitting: Hematology

## 2019-12-11 ENCOUNTER — Inpatient Hospital Stay: Payer: Medicare Other

## 2019-12-11 ENCOUNTER — Other Ambulatory Visit: Payer: Medicare Other

## 2019-12-11 ENCOUNTER — Other Ambulatory Visit: Payer: Self-pay

## 2019-12-11 ENCOUNTER — Inpatient Hospital Stay: Payer: Medicare Other | Attending: Hematology | Admitting: Hematology

## 2019-12-11 VITALS — BP 134/71 | HR 60 | Temp 98.1°F | Resp 18 | Ht 69.0 in | Wt 169.7 lb

## 2019-12-11 DIAGNOSIS — Z95828 Presence of other vascular implants and grafts: Secondary | ICD-10-CM

## 2019-12-11 DIAGNOSIS — Z23 Encounter for immunization: Secondary | ICD-10-CM

## 2019-12-11 DIAGNOSIS — C8331 Diffuse large B-cell lymphoma, lymph nodes of head, face, and neck: Secondary | ICD-10-CM | POA: Diagnosis not present

## 2019-12-11 DIAGNOSIS — C8330 Diffuse large B-cell lymphoma, unspecified site: Secondary | ICD-10-CM | POA: Insufficient documentation

## 2019-12-11 DIAGNOSIS — D696 Thrombocytopenia, unspecified: Secondary | ICD-10-CM | POA: Diagnosis not present

## 2019-12-11 LAB — CBC WITH DIFFERENTIAL/PLATELET
Abs Immature Granulocytes: 0.03 10*3/uL (ref 0.00–0.07)
Basophils Absolute: 0 10*3/uL (ref 0.0–0.1)
Basophils Relative: 0 %
Eosinophils Absolute: 0.1 10*3/uL (ref 0.0–0.5)
Eosinophils Relative: 1 %
HCT: 41.7 % (ref 39.0–52.0)
Hemoglobin: 13.8 g/dL (ref 13.0–17.0)
Immature Granulocytes: 1 %
Lymphocytes Relative: 34 %
Lymphs Abs: 2.3 10*3/uL (ref 0.7–4.0)
MCH: 29.9 pg (ref 26.0–34.0)
MCHC: 33.1 g/dL (ref 30.0–36.0)
MCV: 90.3 fL (ref 80.0–100.0)
Monocytes Absolute: 0.9 10*3/uL (ref 0.1–1.0)
Monocytes Relative: 14 %
Neutro Abs: 3.4 10*3/uL (ref 1.7–7.7)
Neutrophils Relative %: 50 %
Platelets: 115 10*3/uL — ABNORMAL LOW (ref 150–400)
RBC: 4.62 MIL/uL (ref 4.22–5.81)
RDW: 13 % (ref 11.5–15.5)
WBC: 6.7 10*3/uL (ref 4.0–10.5)
nRBC: 0 % (ref 0.0–0.2)

## 2019-12-11 LAB — CMP (CANCER CENTER ONLY)
ALT: 17 U/L (ref 0–44)
AST: 20 U/L (ref 15–41)
Albumin: 3.9 g/dL (ref 3.5–5.0)
Alkaline Phosphatase: 64 U/L (ref 38–126)
Anion gap: 6 (ref 5–15)
BUN: 15 mg/dL (ref 8–23)
CO2: 30 mmol/L (ref 22–32)
Calcium: 9 mg/dL (ref 8.9–10.3)
Chloride: 102 mmol/L (ref 98–111)
Creatinine: 0.84 mg/dL (ref 0.61–1.24)
GFR, Est AFR Am: >60 mL/min (ref >60)
GFR, Estimated: >60 mL/min (ref >60)
Glucose, Bld: 81 mg/dL (ref 70–99)
Potassium: 3.9 mmol/L (ref 3.5–5.1)
Sodium: 138 mmol/L (ref 135–145)
Total Bilirubin: 0.6 mg/dL (ref 0.3–1.2)
Total Protein: 7.2 g/dL (ref 6.5–8.1)

## 2019-12-11 LAB — LACTATE DEHYDROGENASE: LDH: 165 U/L (ref 98–192)

## 2019-12-11 MED ORDER — HEPARIN SOD (PORK) LOCK FLUSH 100 UNIT/ML IV SOLN
500.0000 [IU] | Freq: Once | INTRAVENOUS | Status: AC | PRN
  Administered 2019-12-11: 500 [IU]
  Filled 2019-12-11: qty 5

## 2019-12-11 MED ORDER — SODIUM CHLORIDE 0.9% FLUSH
10.0000 mL | INTRAVENOUS | Status: DC | PRN
  Administered 2019-12-11: 10 mL
  Filled 2019-12-11: qty 10

## 2019-12-11 NOTE — Telephone Encounter (Signed)
Scheduled appointments per 9/16 los. Patient is aware of appointments.

## 2019-12-11 NOTE — Progress Notes (Signed)
HEMATOLOGY/ONCOLOGY CLINIC NOTE  Date of Service: 12/11/2019  Patient Care Team: Denita Lung, MD as PCP - General (Family Medicine)  CHIEF COMPLAINTS/PURPOSE OF CONSULTATION:  Diffuse Large B-Cell Lymphoma  HISTORY OF PRESENTING ILLNESS:   Mario Garcia is a wonderful 86 y.o. male who has been referred to [place] by Dr. Jill Alexanders for evaluation and management of Diffuse Large B-Cell Lymphoma. The pt reports that he is doing well overall.   The pt reports that he feels that he has been a little more tired recently. He first noticed some "nodules" on his neck appear "4-6 weeks ago." He notes that these nodules haven't been painful. He appears to have presented to care with his PCP on 05/28/18, Dr. Redmond School. He notes that he has noticed that he has been sweating more in the last year. He denies noticing any other new lumps or bumps. He denies any fevers, chills, drenching night sweats, or unexpected weight loss. He notes that he has had some changes in swallowing over the last 6 months, which he characterizes as "getting strangled on his saliva every now and then." He denies abdominal pains, acid reflux, changes in bowel habits, leg swelling, skin rashes. He endorses a deep pain "every once in a while," in his left groin. He denies headaches. He endorses glaucoma and a history of cataracts. He sees Dr. Hetty Blend in ophthalmology. He denies any other concern in the last 6 months.  The pt notes that he lives independently with his wife and still is able to do all he wants to do. He walks on trails with his wife and notes that he can generally walk as far as he likes to.  The pt notes that he had prostate cancer in 2008 or 2009, s/p prostatectomy. He did not require RT nor systemic treatments. He denies lung, heart or kidney problems.  Of note prior to the patient's visit today, pt has had a CT Neck completed on 06/05/18 with results revealing Pathologic RIGHT-sided level II, III, IV, and V  lymphadenopathy, most consistent with metastatic carcinoma. An obvious primary source is not identified. Tissue sampling is warranted.  Most recent lab results (07/08/18) of CBC and CMP is as follows: all values are WNL except for PLT at 129k.  On review of systems, pt reports slightly more tired, neck nodules, some changes in swallowing, staying active, and denies fevers, chills, drenching night sweats, unexpected weight loss, abdominal pains, changes in bowel habits, skin rashes, leg swelling, CP, SOB, difficulty breathing, headaches, other lumps or bumps, skin lesions, mouth sores, pain along the spine, back pain, leg swelling, and any other symptoms.   On PMHx the pt reports localized Prostate cancer in 2008 s/p prostatectomy.  On Social Hx the pt reports that he quit smoking over 50 years ago. He notes that he consumes about 1 glass of wine every day, without concerns for excessive drinking. 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 He denies concern for chemical or radiation exposure. On Family Hx the pt reports wife with Stage IV Non Hodgkin's Lymphoma, paternal grandmother with leukemia in her 33s, father with unspecified abdominal cancer.   Interval History:   Mario Garcia returns today for management and evaluation of his Diffuse Large B-Cell Lymphoma. The patient's last visit with [place] was on 09/11/2019. The pt reports that he is doing well overall.  The pt reports he feels well overall, but contiues to have some fatigue. Pt  has a skin lesion along the right side of his face, near his eye, that is sore. Pt has few other spots along his forearms that he thinks may be a rash. Pt takes a daily mulitivitamin and an OTC supplement for his macular degeneration.   Lab results today (12/11/19) of CBC w/diff and CMP is as follows: all values are WNL except for PLT at 115K. 12/11/2019 LDH at 165  On review of systems, pt reports fatigue, rash and denies fevers,  chills, night sweats, unexpected weight loss, abdominal pain, new lumps/bumps, SOB, chest pain, diarrhea, constipation, leg swelling and any other symptoms.   MEDICAL HISTORY:  Past Medical History:  Diagnosis Date  . Cancer ([place])    PROSTATE  . History of colonic polyps   . History of kidney stones   . Hypertension   . Inguinal hernia 03/2002  . Pneumonia    "years ago"    SURGICAL HISTORY: Past Surgical History:  Procedure Laterality Date  . COLONOSCOPY    . DIRECT LARYNGOSCOPY N/A 07/08/2018   Procedure: DIRECT LARYNGOSCOPY;  Surgeon: Leta Baptist, MD;  Location: [facility];  Service: ENT;  Laterality: N/A;  . ESOPHAGOSCOPY N/A 07/08/2018   Procedure: ESOPHAGOSCOPY;  Surgeon: Leta Baptist, MD;  Location: [facility];  Service: ENT;  Laterality: N/A;  . FLEXIBLE BRONCHOSCOPY N/A 07/08/2018   Procedure: FLEXIBLE BRONCHOSCOPY;  Surgeon: Leta Baptist, MD;  Location: [facility];  Service: ENT;  Laterality: N/A;  . HERNIA REPAIR Left    inguinal hernia  . IR IMAGING GUIDED PORT INSERTION  07/30/2018  . MASS BIOPSY Right 07/08/2018   Procedure: RIGHT NECK MASS EXCISIONAL BIOPSY;  Surgeon: Leta Baptist, MD;  Location: [facility];  Service: ENT;  Laterality: Right;  . PATELLA FRACTURE SURGERY Left   . PROSTATE SURGERY  10/2005   RADIAL PROSTATECTOMY  . ROTATOR CUFF REPAIR Bilateral     SOCIAL HISTORY: Social History   Socioeconomic History  . Marital status: Married    Spouse name: Not on file  . Number of children: Not on file  . Years of education: Not on file  . Highest education level: Not on file  Occupational History  . Not on file  Tobacco Use  . Smoking status: Former Research scientist (life sciences)  . Smokeless tobacco: Never Used  . Tobacco comment: stopped 60 years ago  Vaping Use  . Vaping Use: Never used  Substance and Sexual Activity  . Alcohol use: Yes    Alcohol/week: 6.0 standard drinks    Types: 6 Standard drinks or equivalent per week  . Drug use: No  . Sexual activity: Not Currently  Other Topics Concern  .  Not on file  Social History Narrative  . Not on file   Social Determinants of Health   Financial Resource Strain:   . Difficulty of Paying Living Expenses: Not on file  Food Insecurity:   . Worried About Charity fundraiser in the Last Year: Not on file  . Ran Out of Food in the Last Year: Not on file  Transportation Needs:   . Lack of Transportation (Medical): Not on file  . Lack of Transportation (Non-Medical): Not on file  Physical Activity:   . Days of Exercise per Week: Not on file  . Minutes of Exercise per Session: Not on file  Stress:   . Feeling of Stress : Not on file  Social Connections:   . Frequency of Communication with Friends and Family: Not on file  . Frequency of Social  Gatherings with Friends and Family: Not on file  . Attends Religious Services: Not on file  . Active Member of Clubs or Organizations: Not on file  . Attends Archivist Meetings: Not on file  . Marital Status: Not on file  Intimate Partner Violence:   . Fear of Current or Ex-Partner: Not on file  . Emotionally Abused: Not on file  . Physically Abused: Not on file  . Sexually Abused: Not on file    FAMILY HISTORY: Family History  Problem Relation Age of Onset  . Cancer Father   . Cancer Brother     ALLERGIES:  is allergic to lisinopril and penicillins.  MEDICATIONS:  Current Outpatient Medications  Medication Sig Dispense Refill  . Carboxymethylcellulose Sodium (ARTIFICIAL TEARS OP) Place 1 drop into both eyes daily as needed (dry eyes).    Marland Kitchen latanoprost (XALATAN) 0.005 % ophthalmic solution Place 1 drop into both eyes at bedtime.    . lidocaine-prilocaine (EMLA) cream Apply to affected area once 30 g 3  . losartan-hydrochlorothiazide (HYZAAR) 50-12.5 MG tablet TAKE 1 TABLET BY MOUTH EVERY DAY 90 tablet 0  . pravastatin (PRAVACHOL) 40 MG tablet Take 1 tablet by mouth once daily 90 tablet 0   No current facility-administered medications for this visit.    REVIEW OF  SYSTEMS:   A 10+ POINT REVIEW OF SYSTEMS WAS OBTAINED including neurology, dermatology, psychiatry, cardiac, respiratory, lymph, extremities, GI, GU, Musculoskeletal, constitutional, breasts, reproductive, HEENT.  All pertinent positives are noted in the HPI.  All others are negative.   PHYSICAL EXAMINATION: ECOG FS:1 - Symptomatic but completely ambulatory  Vitals:   12/11/19 1052  BP: 134/71  Pulse: 60  Resp: 18  Temp: 98.1 F (36.7 C)  SpO2: 97%   Wt Readings from Last 3 Encounters:  12/11/19 169 lb 11.2 oz (77 kg)  09/11/19 172 lb 8 oz (78.2 kg)  08/08/19 171 lb 12.8 oz (77.9 kg)   Body mass index is 25.06 kg/m.    GENERAL:alert, in no acute distress and comfortable SKIN: no acute rashes, no significant lesions EYES: conjunctiva are pink and non-injected, sclera anicteric OROPHARYNX: MMM, no exudates, no oropharyngeal erythema or ulceration NECK: supple, no JVD LYMPH:  no palpable lymphadenopathy in the cervical, axillary or inguinal regions LUNGS: clear to auscultation b/l with normal respiratory effort HEART: regular rate & rhythm ABDOMEN:  normoactive bowel sounds , non tender, not distended. No palpable hepatosplenomegaly.  Extremity: no pedal edema PSYCH: alert & oriented x 3 with fluent speech NEURO: no focal motor/sensory deficits  LABORATORY DATA:  I have reviewed the data as listed  . CBC Latest Ref Rng & Units 12/11/2019 09/11/2019 06/12/2019  WBC 4.0 - 10.5 K/uL 6.7 6.7 5.6  Hemoglobin 13.0 - 17.0 g/dL 13.8 13.9 14.1  Hematocrit 39 - 52 % 41.7 41.7 42.6  Platelets 150 - 400 K/uL 115(L) 141(L) 150    . CMP Latest Ref Rng & Units 12/11/2019 09/11/2019 06/12/2019  Glucose 70 - 99 mg/dL 81 88 134(H)  BUN 8 - 23 mg/dL _0 Creatinine 0.61 - 1.24 mg/dL 0.84 0.92 0.83  Sodium 135 - 145 mmol/L 138 140 139  Potassium 3.5 - 5.1 mmol/L 3.9 3.5 3.5  Chloride 98 - 111 mmol/L 102 104 102  CO2 22 - 32 mmol/L _1 Calcium 8.9 - 10.3 mg/dL 9.0 8.7(L) 8.4(L)   Total Protein 6.5 - 8.1 g/dL 7.2 7.3 6.9  Total Bilirubin 0.3 - 1.2 mg/dL 0.6 0.6 0.6  Alkaline Phos 38 - 126 U/L 64 67 59  AST 15 - 41 U/L _0 ALT 0 - 44 U/L _1 . Lab Results  Component Value Date   LDH 165 12/11/2019   03/03/2019 NM PET Image Restag (PS) Skull Base To Thigh (Accession [id number]):   07/08/18 Right Cervical Tissue Biopsy:    RADIOGRAPHIC STUDIES: I have personally reviewed the radiological images as listed and agreed with the findings in the report. No results found.  ASSESSMENT & PLAN:   86 y.o. male with  1. Diffuse Large B-Cell Lymphoma, Stage IV Presenting without constitutional symptoms. Palpable right cervical and supraclavicular lymphadenopathy.   07/08/18 Right cervical soft tissue biopsy revealed Diffuse Large B-Cell Lymphoma, germinal center type   06/05/18 CT Neck revealed Pathologic RIGHT-sided level II, III, IV, and V lymphadenopathy, most consistent with metastatic carcinoma. An obvious primary source is not identified. Tissue sampling is warranted.  07/16/18 Hep B and Hep C negative  07/17/18 ECHO revealed LV EF of 60-65%  07/22/18 PET/CT revealed "Hypermetabolic adenopathy especially concentrated in the right neck but also in the left neck, chest, abdomen, and pelvis. The adenopathy is primarily Deauville 5 although some few scattered smaller lesions are Deauville 4. 2. Diffuse abnormal splenic activity, Deauville 5, without overt splenomegaly. 3. There is also hypermetabolic Deauville 5 activity in the mildly thickened distal appendix, and raising suspicion for involvement of the appendix. 4. Other imaging findings of potential clinical significance: Aortic Atherosclerosis. Coronary atherosclerosis."  10/03/2018 PET scan revealed "Interval response to therapy with stable to decreased size of lymph nodes on CT and generalized decrease in hypermetabolism of the abnormal nodes. Hypermetabolism in the lymph nodes today is compatible  with a combination of Deauville 3 and Deauville 4 disease. Stable tiny bilateral pulmonary nodules. Increase radiotracer accumulation in the marrow space on today's study, presumably representing marrow stimulatory effects of therapy."   PLAN: -Discussed pt labwork today, 12/11/19; PLT continue to drop, other blood counts and chemistries are nml, LDH is WNL -No lab or clinical evidence of DLBCL recurrence at this time.  -No indication to restart treatment. Will continue watchful observance at this time.  -Discussed the CDC guidelines regarding the COVID19 vaccine. Will give in clinic today.  -Recommend pt receive the annual flu vaccine. Recommend two weeks between vaccinations if possible.  -Recommend pt f/u with Dermatology for evaluation of skin lesions -Will get repeat PET/CT in 8 weeks  -Will see back in 9 weeks with labs     FOLLOW UP: Covid booster vaccine today PET/CT in 8 weeks RTC with Dr Irene Limbo with portflush and labs in 9 weeks   The total time spent in the appt was 20 minutes and more than 50% was on counseling and direct patient cares.  All of the patient's questions were answered with apparent satisfaction. The patient knows to call the clinic with any problems, questions or concerns.   Sullivan Lone MD [place] AAHIVMS [facility] Hematology/Oncology Physician [facility]  (Office):       [phone] (Work cell):  [phone] (Fax):           [phone]  12/11/2019 11:42 AM  I, Yevette Edwards, am acting as a scribe for Dr. Sullivan Lone.   .I have reviewed the above documentation for accuracy and completeness, and I agree with the above. Brunetta Genera MD

## 2019-12-11 NOTE — Patient Instructions (Signed)

## 2019-12-26 ENCOUNTER — Other Ambulatory Visit (INDEPENDENT_AMBULATORY_CARE_PROVIDER_SITE_OTHER): Payer: Medicare Other

## 2019-12-26 ENCOUNTER — Other Ambulatory Visit: Payer: Self-pay

## 2019-12-26 DIAGNOSIS — Z23 Encounter for immunization: Secondary | ICD-10-CM | POA: Diagnosis not present

## 2020-01-28 ENCOUNTER — Other Ambulatory Visit: Payer: Self-pay | Admitting: Family Medicine

## 2020-01-28 DIAGNOSIS — I10 Essential (primary) hypertension: Secondary | ICD-10-CM

## 2020-02-09 ENCOUNTER — Other Ambulatory Visit: Payer: Self-pay

## 2020-02-09 ENCOUNTER — Ambulatory Visit (HOSPITAL_COMMUNITY)
Admission: RE | Admit: 2020-02-09 | Discharge: 2020-02-09 | Disposition: A | Discharge status: Home / Self Care | Payer: Medicare Other | Source: Ambulatory Visit | Attending: Hematology | Admitting: Hematology

## 2020-02-09 DIAGNOSIS — C8331 Diffuse large B-cell lymphoma, lymph nodes of head, face, and neck: Secondary | ICD-10-CM | POA: Diagnosis not present

## 2020-02-09 DIAGNOSIS — K4090 Unilateral inguinal hernia, without obstruction or gangrene, not specified as recurrent: Secondary | ICD-10-CM | POA: Diagnosis not present

## 2020-02-09 DIAGNOSIS — I2510 Atherosclerotic heart disease of native coronary artery without angina pectoris: Secondary | ICD-10-CM | POA: Diagnosis not present

## 2020-02-09 DIAGNOSIS — I700 Atherosclerosis of aorta: Secondary | ICD-10-CM | POA: Insufficient documentation

## 2020-02-09 DIAGNOSIS — D696 Thrombocytopenia, unspecified: Secondary | ICD-10-CM | POA: Insufficient documentation

## 2020-02-09 DIAGNOSIS — C8330 Diffuse large B-cell lymphoma, unspecified site: Secondary | ICD-10-CM | POA: Diagnosis not present

## 2020-02-09 LAB — GLUCOSE, CAPILLARY: Glucose-Capillary: 99 mg/dL (ref 70–99)

## 2020-02-09 MED ORDER — FLUDEOXYGLUCOSE F - 18 (FDG) INJECTION
8.4000 | Freq: Once | INTRAVENOUS | Status: AC
  Administered 2020-02-09: 8.4 via INTRAVENOUS

## 2020-02-11 NOTE — Progress Notes (Signed)
HEMATOLOGY/ONCOLOGY CLINIC NOTE  Date of Service: 02/12/2020  Patient Care Team: Denita Lung, MD as PCP - General (Family Medicine)  CHIEF COMPLAINTS/PURPOSE OF CONSULTATION:  Diffuse Large B-Cell Lymphoma  HISTORY OF PRESENTING ILLNESS:   Mario Garcia is a wonderful 86 y.o. male who has been referred to [place] by Dr. Jill Alexanders for evaluation and management of Diffuse Large B-Cell Lymphoma. The pt reports that he is doing well overall.   The pt reports that he feels that he has been a little more tired recently. He first noticed some "nodules" on his neck appear "4-6 weeks ago." He notes that these nodules haven't been painful. He appears to have presented to care with his PCP on 05/28/18, Dr. Redmond School. He notes that he has noticed that he has been sweating more in the last year. He denies noticing any other new lumps or bumps. He denies any fevers, chills, drenching night sweats, or unexpected weight loss. He notes that he has had some changes in swallowing over the last 6 months, which he characterizes as "getting strangled on his saliva every now and then." He denies abdominal pains, acid reflux, changes in bowel habits, leg swelling, skin rashes. He endorses a deep pain "every once in a while," in his left groin. He denies headaches. He endorses glaucoma and a history of cataracts. He sees Dr. Hetty Blend in ophthalmology. He denies any other concern in the last 6 months.  The pt notes that he lives independently with his wife and still is able to do all he wants to do. He walks on trails with his wife and notes that he can generally walk as far as he likes to.  The pt notes that he had prostate cancer in 2008 or 2009, s/p prostatectomy. He did not require RT nor systemic treatments. He denies lung, heart or kidney problems.  Of note prior to the patient's visit today, pt has had a CT Neck completed on 06/05/18 with results revealing Pathologic RIGHT-sided level II, III, IV, and V  lymphadenopathy, most consistent with metastatic carcinoma. An obvious primary source is not identified. Tissue sampling is warranted.  Most recent lab results (07/08/18) of CBC and CMP is as follows: all values are WNL except for PLT at 129k.  On review of systems, pt reports slightly more tired, neck nodules, some changes in swallowing, staying active, and denies fevers, chills, drenching night sweats, unexpected weight loss, abdominal pains, changes in bowel habits, skin rashes, leg swelling, CP, SOB, difficulty breathing, headaches, other lumps or bumps, skin lesions, mouth sores, pain along the spine, back pain, leg swelling, and any other symptoms.   On PMHx the pt reports localized Prostate cancer in 2008 s/p prostatectomy.  On Social Hx the pt reports that he quit smoking over 50 years ago. He notes that he consumes about 1 glass of wine every day, without concerns for excessive drinking. 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 He denies concern for chemical or radiation exposure. On Family Hx the pt reports wife with Stage IV Non Hodgkin's Lymphoma, paternal grandmother with leukemia in her 22s, father with unspecified abdominal cancer.   Interval History:   Mario Garcia returns today for management and evaluation of his Diffuse Large B-Cell Lymphoma. We are joined today by his wife. The patient's last visit with [place] was on 12/11/2019. The pt reports that he is doing well overall.  The pt reports that he has been  experiencing upper abdominal pain. His right inguinal hernia is beginning to protrude and is limiting his activities at this time. He has mild acid reflux that is manageable. He continues to experience some fatigue. Pt denies any recent infections and received his COVID19 booster at our last visit. He has also gotten his annual flu vaccine.   Of note since the patient's last visit, pt has had PET/CT ([id number]) completed on 02/09/2020 with results  revealing "1. Increasingly hypermetabolic lymph nodes in the neck, chest, abdomen and pelvis and newly hypermetabolic spleen findings consistent with worsening lymphoma. 2. Focal hypermetabolism in segment 4 of the liver without a definite CT correlate. 3. Large right inguinal hernia contains unobstructed small bowel. 4. Aortic atherosclerosis (ICD10-I70.0). Coronary artery calcification."  Lab results today (02/12/20) of CBC w/diff and CMP is as follows: all values are WNL except for PLT at 122K, Glucose at 107, Calcium at 8.7. 02/12/2020 LDH at 169  On review of systems, pt reports mild reflux, upper abdominal pain, fatigue and denies fevers, chills, night sweats and any other symptoms.   MEDICAL HISTORY:  Past Medical History:  Diagnosis Date  . Cancer ([place])    PROSTATE  . History of colonic polyps   . History of kidney stones   . Hypertension   . Inguinal hernia 03/2002  . Pneumonia    "years ago"    SURGICAL HISTORY: Past Surgical History:  Procedure Laterality Date  . COLONOSCOPY    . DIRECT LARYNGOSCOPY N/A 07/08/2018   Procedure: DIRECT LARYNGOSCOPY;  Surgeon: Leta Baptist, MD;  Location: [facility];  Service: ENT;  Laterality: N/A;  . ESOPHAGOSCOPY N/A 07/08/2018   Procedure: ESOPHAGOSCOPY;  Surgeon: Leta Baptist, MD;  Location: [facility];  Service: ENT;  Laterality: N/A;  . FLEXIBLE BRONCHOSCOPY N/A 07/08/2018   Procedure: FLEXIBLE BRONCHOSCOPY;  Surgeon: Leta Baptist, MD;  Location: [facility];  Service: ENT;  Laterality: N/A;  . HERNIA REPAIR Left    inguinal hernia  . IR IMAGING GUIDED PORT INSERTION  07/30/2018  . MASS BIOPSY Right 07/08/2018   Procedure: RIGHT NECK MASS EXCISIONAL BIOPSY;  Surgeon: Leta Baptist, MD;  Location: [facility];  Service: ENT;  Laterality: Right;  . PATELLA FRACTURE SURGERY Left   . PROSTATE SURGERY  10/2005   RADIAL PROSTATECTOMY  . ROTATOR CUFF REPAIR Bilateral     SOCIAL HISTORY: Social History   Socioeconomic History  . Marital status: Married    Spouse name: Not on  file  . Number of children: Not on file  . Years of education: Not on file  . Highest education level: Not on file  Occupational History  . Not on file  Tobacco Use  . Smoking status: Former Research scientist (life sciences)  . Smokeless tobacco: Never Used  . Tobacco comment: stopped 60 years ago  Vaping Use  . Vaping Use: Never used  Substance and Sexual Activity  . Alcohol use: Yes    Alcohol/week: 6.0 standard drinks    Types: 6 Standard drinks or equivalent per week  . Drug use: No  . Sexual activity: Not Currently  Other Topics Concern  . Not on file  Social History Narrative  . Not on file   Social Determinants of Health   Financial Resource Strain:   . Difficulty of Paying Living Expenses: Not on file  Food Insecurity:   . Worried About Charity fundraiser in the Last Year: Not on file  . Ran Out of Food in the Last Year: Not on file  Transportation  Needs:   . Lack of Transportation (Medical): Not on file  . Lack of Transportation (Non-Medical): Not on file  Physical Activity:   . Days of Exercise per Week: Not on file  . Minutes of Exercise per Session: Not on file  Stress:   . Feeling of Stress : Not on file  Social Connections:   . Frequency of Communication with Friends and Family: Not on file  . Frequency of Social Gatherings with Friends and Family: Not on file  . Attends Religious Services: Not on file  . Active Member of Clubs or Organizations: Not on file  . Attends Archivist Meetings: Not on file  . Marital Status: Not on file  Intimate Partner Violence:   . Fear of Current or Ex-Partner: Not on file  . Emotionally Abused: Not on file  . Physically Abused: Not on file  . Sexually Abused: Not on file    FAMILY HISTORY: Family History  Problem Relation Age of Onset  . Cancer Father   . Cancer Brother     ALLERGIES:  is allergic to lisinopril and penicillins.  MEDICATIONS:  Current Outpatient Medications  Medication Sig Dispense Refill  .  Carboxymethylcellulose Sodium (ARTIFICIAL TEARS OP) Place 1 drop into both eyes daily as needed (dry eyes).    Marland Kitchen latanoprost (XALATAN) 0.005 % ophthalmic solution Place 1 drop into both eyes at bedtime.    . lidocaine-prilocaine (EMLA) cream Apply to affected area once 30 g 3  . losartan-hydrochlorothiazide (HYZAAR) 50-12.5 MG tablet TAKE 1 TABLET BY MOUTH EVERY DAY 90 tablet 0  . pravastatin (PRAVACHOL) 40 MG tablet Take 1 tablet by mouth once daily 90 tablet 0   No current facility-administered medications for this visit.    REVIEW OF SYSTEMS:   A 10+ POINT REVIEW OF SYSTEMS WAS OBTAINED including neurology, dermatology, psychiatry, cardiac, respiratory, lymph, extremities, GI, GU, Musculoskeletal, constitutional, breasts, reproductive, HEENT.  All pertinent positives are noted in the HPI.  All others are negative.   PHYSICAL EXAMINATION: ECOG FS:1 - Symptomatic but completely ambulatory  Vitals:   02/12/20 0926  BP: 130/78  Pulse: 62  Resp: 18  Temp: (!) 97.5 F (36.4 C)  SpO2: 98%   Wt Readings from Last 3 Encounters:  02/12/20 172 lb (78 kg)  12/11/19 169 lb 11.2 oz (77 kg)  09/11/19 172 lb 8 oz (78.2 kg)   Body mass index is 25.4 kg/m.    GENERAL:alert, in no acute distress and comfortable SKIN: no acute rashes, no significant lesions EYES: conjunctiva are pink and non-injected, sclera anicteric OROPHARYNX: MMM, no exudates, no oropharyngeal erythema or ulceration NECK: supple, no JVD LYMPH:  no palpable lymphadenopathy in the axillary or inguinal regions. Multiple, small cervical lymph nodes.  LUNGS: clear to auscultation b/l with normal respiratory effort HEART: regular rate & rhythm ABDOMEN:  normoactive bowel sounds , non tender, not distended. No palpable hepatosplenomegaly.  Extremity: no pedal edema PSYCH: alert & oriented x 3 with fluent speech NEURO: no focal motor/sensory deficits  LABORATORY DATA:  I have reviewed the data as listed  . CBC Latest Ref  Rng & Units 02/12/2020 12/11/2019 09/11/2019  WBC 4.0 - 10.5 K/uL 8.0 6.7 6.7  Hemoglobin 13.0 - 17.0 g/dL 13.2 13.8 13.9  Hematocrit 39 - 52 % 40.4 41.7 41.7  Platelets 150 - 400 K/uL 122(L) 115(L) 141(L)    . CMP Latest Ref Rng & Units 02/12/2020 12/11/2019 09/11/2019  Glucose 70 - 99 mg/dL 107(H) 81 88  BUN  8 - 23 mg/dL _0 Creatinine 0.61 - 1.24 mg/dL 0.93 0.84 0.92  Sodium 135 - 145 mmol/L 141 138 140  Potassium 3.5 - 5.1 mmol/L 3.7 3.9 3.5  Chloride 98 - 111 mmol/L 102 102 104  CO2 22 - 32 mmol/L _1 Calcium 8.9 - 10.3 mg/dL 8.7(L) 9.0 8.7(L)  Total Protein 6.5 - 8.1 g/dL 7.0 7.2 7.3  Total Bilirubin 0.3 - 1.2 mg/dL 0.5 0.6 0.6  Alkaline Phos 38 - 126 U/L 70 64 67  AST 15 - 41 U/L _2 ALT 0 - 44 U/L _3 . Lab Results  Component Value Date   LDH 169 02/12/2020   03/03/2019 NM PET Image Restag (PS) Skull Base To Thigh (Accession [id number]):   07/08/18 Right Cervical Tissue Biopsy:    RADIOGRAPHIC STUDIES: I have personally reviewed the radiological images as listed and agreed with the findings in the report. NM PET Image Restag (PS) Skull Base To Thigh  Result Date: 02/09/2020 CLINICAL DATA:  Subsequent treatment strategy for large B-cell lymphoma. EXAM: NUCLEAR MEDICINE PET SKULL BASE TO THIGH TECHNIQUE: 8.4 mCi F-18 FDG was injected intravenously. Full-ring PET imaging was performed from the skull base to thigh after the radiotracer. CT data was obtained and used for attenuation correction and anatomic localization. Fasting blood glucose: 99 mg/dl COMPARISON:  06/05/2019. FINDINGS: Mediastinal blood pool activity: SUV max 2.1 Liver activity: SUV max 3.6 NECK: Stable right neck lymph nodes with an index 8 mm level 2 node (4/28), SUV max 6.3. No new hypermetabolic lymph nodes. Incidental CT findings: None. CHEST: Bilateral axillary lymph nodes are stable in size but increased in metabolism. Index high left axillary lymph node measures 7 mm (4/46) with  an SUV max of 7.3 compared to 3.2 previously. New hypermetabolism within supraclavicular lymph nodes. Index left supraclavicular lymph node measures 5 mm (4/43) with an SUV max of 4.8. Hypermetabolic internal mammary and mediastinal/hilar lymph nodes. Index prevascular lymph node measures 7 mm (4/63) with an SUV max of 3.0, compared to 1.9 previously. No hypermetabolic pulmonary nodules. Incidental CT findings: Left IJ Port-A-Cath terminates in the high right atrium. Atherosclerotic calcification of the aorta, aortic valve and coronary arteries. Heart is enlarged. No pericardial effusion. A few scattered subpleural pulmonary nodules measure up to 5 mm in the right lower lobe (8/48), unchanged and too small for PET resolution. No pleural effusion. ABDOMEN/PELVIS: Focal hypermetabolism is seen in the liver, adjacent to the gallbladder fossa, without a definite CT correlate. Spleen is hypermetabolic, SUV max 9.5. No abnormal hypermetabolism in the adrenal glands or pancreas. Periportal lymph nodes measure up to 9 mm (4/109) with an SUV max 7.0, compared to 3.9 previously. Abdominal and pelvic retroperitoneal lymph nodes have increased in hypermetabolism. Index aortocaval lymph node measures 6 mm (4/124) with an SUV max of 7.4 previously 2.1. Index left external iliac lymph node measures 9 mm (4/173) with an SUV max of 8.4, compared to 6.2 previously. Finally, there are hypermetabolic mesenteric lymph nodes. Index ileocolic mesenteric lymph node measures 7 mm (4/142) with an SUV max of 5.9. Incidental CT findings: Liver, gallbladder, adrenal glands, kidneys, spleen, pancreas stomach and bowel are grossly unremarkable. Atherosclerotic calcification of the aorta without aneurysm. Right inguinal hernia contains unobstructed small bowel. SKELETON: No abnormal hypermetabolism. Incidental CT findings: Degenerative changes in the spine. IMPRESSION: 1. Increasingly hypermetabolic lymph nodes in the neck, chest, abdomen and  pelvis and newly hypermetabolic spleen findings consistent with  worsening lymphoma. 2. Focal hypermetabolism in segment 4 of the liver without a definite CT correlate. Recommend attention on follow-up. 3. Large right inguinal hernia contains unobstructed small bowel. 4. Aortic atherosclerosis (ICD10-I70.0). Coronary artery calcification. Electronically Signed   By: Lorin Picket M.D.   On: 02/09/2020 09:03    ASSESSMENT & PLAN:   86 y.o. male with  1. Diffuse Large B-Cell Lymphoma, Stage IV Presenting without constitutional symptoms. Palpable right cervical and supraclavicular lymphadenopathy.   07/08/18 Right cervical soft tissue biopsy revealed Diffuse Large B-Cell Lymphoma, germinal center type   06/05/18 CT Neck revealed Pathologic RIGHT-sided level II, III, IV, and V lymphadenopathy, most consistent with metastatic carcinoma. An obvious primary source is not identified. Tissue sampling is warranted.  07/16/18 Hep B and Hep C negative  07/17/18 ECHO revealed LV EF of 60-65%  07/22/18 PET/CT revealed "Hypermetabolic adenopathy especially concentrated in the right neck but also in the left neck, chest, abdomen, and pelvis. The adenopathy is primarily Deauville 5 although some few scattered smaller lesions are Deauville 4. 2. Diffuse abnormal splenic activity, Deauville 5, without overt splenomegaly. 3. There is also hypermetabolic Deauville 5 activity in the mildly thickened distal appendix, and raising suspicion for involvement of the appendix. 4. Other imaging findings of potential clinical significance: Aortic Atherosclerosis. Coronary atherosclerosis."  10/03/2018 PET scan revealed "Interval response to therapy with stable to decreased size of lymph nodes on CT and generalized decrease in hypermetabolism of the abnormal nodes. Hypermetabolism in the lymph nodes today is compatible with a combination of Deauville 3 and Deauville 4 disease. Stable tiny bilateral pulmonary nodules. Increase  radiotracer accumulation in the marrow space on today's study, presumably representing marrow stimulatory effects of therapy."   PLAN: -Discussed pt labwork today, 02/12/20; PLT are stable, WBC & RBC are nml, blood chemistries are stable, LDH at WNL. -Discussed 02/09/2020 PET/CT ([id number]) which revealed "1. Increasingly hypermetabolic lymph nodes in the neck, chest, abdomen and pelvis and newly hypermetabolic spleen findings consistent with worsening lymphoma." -Advised pt that we are not seeing a dramatic increase in the size of his lymph nodes, but we are seeing more activity.  -Advised pt that we would not expect symptoms to be caused by lymphoma based on apparent burden of disease.  -Advised pt that this appears to be moving slowly. Would need a repeat lymph node biopsy to confirm recurrent DLBCL vs low grade FL vs other etiology of LNadenopathy. -Advised pt that if lymphadenopathy is caused by a reactive process it would not be a contraindication to a hernia repair surgery.  -Will get US-guided Lymph Node Biopsy in 1 week -Will see back in 2 weeks   FOLLOW UP: -US guided core needle biopsy of cervical or axillary hypermetabolic LN stat within 1 week -RTC with Dr Irene Limbo in 2 weeks   The total time spent in the appt was 20 minutes and more than 50% was on counseling and direct patient cares.  All of the patient's questions were answered with apparent satisfaction. The patient knows to call the clinic with any problems, questions or concerns.   Sullivan Lone MD [place] AAHIVMS [facility] Hematology/Oncology Physician [facility]  (Office):       [phone] (Work cell):  [phone] (Fax):           [phone]  02/12/2020 10:28 AM  I, Yevette Edwards, am acting as a scribe for Dr. Sullivan Lone.   .I have reviewed the above documentation for accuracy and completeness, and I agree with  the above. Brunetta Genera MD

## 2020-02-12 ENCOUNTER — Inpatient Hospital Stay (HOSPITAL_BASED_OUTPATIENT_CLINIC_OR_DEPARTMENT_OTHER): Payer: Medicare Other | Admitting: Hematology

## 2020-02-12 ENCOUNTER — Other Ambulatory Visit: Payer: Self-pay

## 2020-02-12 ENCOUNTER — Inpatient Hospital Stay: Payer: Medicare Other

## 2020-02-12 ENCOUNTER — Inpatient Hospital Stay: Payer: Medicare Other | Attending: Hematology

## 2020-02-12 VITALS — BP 130/78 | HR 62 | Temp 97.5°F | Resp 18 | Ht 69.0 in | Wt 172.0 lb

## 2020-02-12 DIAGNOSIS — C8331 Diffuse large B-cell lymphoma, lymph nodes of head, face, and neck: Secondary | ICD-10-CM

## 2020-02-12 DIAGNOSIS — Z86010 Personal history of colonic polyps: Secondary | ICD-10-CM | POA: Diagnosis not present

## 2020-02-12 DIAGNOSIS — C8330 Diffuse large B-cell lymphoma, unspecified site: Secondary | ICD-10-CM | POA: Diagnosis not present

## 2020-02-12 DIAGNOSIS — Z95828 Presence of other vascular implants and grafts: Secondary | ICD-10-CM

## 2020-02-12 DIAGNOSIS — R591 Generalized enlarged lymph nodes: Secondary | ICD-10-CM | POA: Diagnosis not present

## 2020-02-12 LAB — CMP (CANCER CENTER ONLY)
ALT: 12 U/L (ref 0–44)
AST: 18 U/L (ref 15–41)
Albumin: 3.8 g/dL (ref 3.5–5.0)
Alkaline Phosphatase: 70 U/L (ref 38–126)
Anion gap: 11 (ref 5–15)
BUN: 14 mg/dL (ref 8–23)
CO2: 28 mmol/L (ref 22–32)
Calcium: 8.7 mg/dL — ABNORMAL LOW (ref 8.9–10.3)
Chloride: 102 mmol/L (ref 98–111)
Creatinine: 0.93 mg/dL (ref 0.61–1.24)
GFR, Estimated: >60 mL/min (ref >60)
Glucose, Bld: 107 mg/dL — ABNORMAL HIGH (ref 70–99)
Potassium: 3.7 mmol/L (ref 3.5–5.1)
Sodium: 141 mmol/L (ref 135–145)
Total Bilirubin: 0.5 mg/dL (ref 0.3–1.2)
Total Protein: 7 g/dL (ref 6.5–8.1)

## 2020-02-12 LAB — CBC WITH DIFFERENTIAL/PLATELET
Abs Immature Granulocytes: 0.03 10*3/uL (ref 0.00–0.07)
Basophils Absolute: 0 10*3/uL (ref 0.0–0.1)
Basophils Relative: 0 %
Eosinophils Absolute: 0.1 10*3/uL (ref 0.0–0.5)
Eosinophils Relative: 1 %
HCT: 40.4 % (ref 39.0–52.0)
Hemoglobin: 13.2 g/dL (ref 13.0–17.0)
Immature Granulocytes: 0 %
Lymphocytes Relative: 38 %
Lymphs Abs: 3 10*3/uL (ref 0.7–4.0)
MCH: 29.7 pg (ref 26.0–34.0)
MCHC: 32.7 g/dL (ref 30.0–36.0)
MCV: 90.8 fL (ref 80.0–100.0)
Monocytes Absolute: 0.9 10*3/uL (ref 0.1–1.0)
Monocytes Relative: 12 %
Neutro Abs: 3.9 10*3/uL (ref 1.7–7.7)
Neutrophils Relative %: 49 %
Platelets: 122 10*3/uL — ABNORMAL LOW (ref 150–400)
RBC: 4.45 MIL/uL (ref 4.22–5.81)
RDW: 12.7 % (ref 11.5–15.5)
WBC: 8 10*3/uL (ref 4.0–10.5)
nRBC: 0 % (ref 0.0–0.2)

## 2020-02-12 LAB — LACTATE DEHYDROGENASE: LDH: 169 U/L (ref 98–192)

## 2020-02-12 MED ORDER — HEPARIN SOD (PORK) LOCK FLUSH 100 UNIT/ML IV SOLN
500.0000 [IU] | Freq: Once | INTRAVENOUS | Status: AC | PRN
  Administered 2020-02-12: 500 [IU]
  Filled 2020-02-12: qty 5

## 2020-02-12 MED ORDER — SODIUM CHLORIDE 0.9% FLUSH
10.0000 mL | INTRAVENOUS | Status: DC | PRN
  Administered 2020-02-12: 10 mL
  Filled 2020-02-12: qty 10

## 2020-02-13 ENCOUNTER — Encounter (HOSPITAL_COMMUNITY): Payer: Self-pay

## 2020-02-13 NOTE — Progress Notes (Unsigned)
Mario Garcia   Mario Barters. Garcia Male, 86 y.o., 1931/08/04 MRN:  [id number] Phone:  [phone] Mario Garcia) PCP:  Mario Lung, MD Primary Cvg:  Medicare/Medicare Part A And B Next Appt With Radiology ([facility]) 02/23/2020 at 1:00 PM  RE: Biopsy Received: Yesterday Message Details  Mario Cleveland, MD  Mario Garcia Phone Number: [phone]  Ok   [place] core R cerv LAN  R/o lymphoma  They are not large -- see PET   DDH   Previous Messages  ----- Message -----  From: Mario Garcia  Sent: 02/12/2020  1:31 PM EST  To: [facility] Procedure Requests  Subject: Biopsy                      Procedure Requested: US guided core needle biopsy of cervical/supraclavicular or axillary LN    Reason for Procedure: patient with previous h/o large B cell lymphoma with new hypermetabolic LNadenopathy   Provider Requesting: Dr Irene Limbo  Provider Telephone: [phone]   Other Info: STAT

## 2020-02-16 ENCOUNTER — Other Ambulatory Visit: Payer: Self-pay | Admitting: Radiology

## 2020-02-18 ENCOUNTER — Other Ambulatory Visit: Payer: Self-pay | Admitting: Radiology

## 2020-02-18 ENCOUNTER — Ambulatory Visit: Payer: Medicare Other | Admitting: Family Medicine

## 2020-02-23 ENCOUNTER — Other Ambulatory Visit: Payer: Self-pay

## 2020-02-23 ENCOUNTER — Ambulatory Visit (HOSPITAL_COMMUNITY)
Admission: RE | Admit: 2020-02-23 | Discharge: 2020-02-23 | Disposition: A | Discharge status: Home / Self Care | Payer: Medicare Other | Source: Ambulatory Visit | Attending: Hematology | Admitting: Hematology

## 2020-02-23 ENCOUNTER — Encounter (HOSPITAL_COMMUNITY): Payer: Self-pay

## 2020-02-23 DIAGNOSIS — C8591 Non-Hodgkin lymphoma, unspecified, lymph nodes of head, face, and neck: Secondary | ICD-10-CM | POA: Diagnosis not present

## 2020-02-23 DIAGNOSIS — R590 Localized enlarged lymph nodes: Secondary | ICD-10-CM | POA: Diagnosis not present

## 2020-02-23 DIAGNOSIS — Z8546 Personal history of malignant neoplasm of prostate: Secondary | ICD-10-CM | POA: Insufficient documentation

## 2020-02-23 DIAGNOSIS — C8511 Unspecified B-cell lymphoma, lymph nodes of head, face, and neck: Secondary | ICD-10-CM | POA: Diagnosis not present

## 2020-02-23 DIAGNOSIS — R591 Generalized enlarged lymph nodes: Secondary | ICD-10-CM | POA: Diagnosis not present

## 2020-02-23 DIAGNOSIS — Z8572 Personal history of non-Hodgkin lymphomas: Secondary | ICD-10-CM | POA: Diagnosis not present

## 2020-02-23 DIAGNOSIS — Z888 Allergy status to other drugs, medicaments and biological substances status: Secondary | ICD-10-CM | POA: Insufficient documentation

## 2020-02-23 DIAGNOSIS — Z9079 Acquired absence of other genital organ(s): Secondary | ICD-10-CM | POA: Diagnosis not present

## 2020-02-23 DIAGNOSIS — C8331 Diffuse large B-cell lymphoma, lymph nodes of head, face, and neck: Secondary | ICD-10-CM | POA: Diagnosis not present

## 2020-02-23 DIAGNOSIS — Z9889 Other specified postprocedural states: Secondary | ICD-10-CM | POA: Diagnosis not present

## 2020-02-23 DIAGNOSIS — Z880 Allergy status to penicillin: Secondary | ICD-10-CM | POA: Insufficient documentation

## 2020-02-23 LAB — CBC WITH DIFFERENTIAL/PLATELET
Abs Immature Granulocytes: 0.03 10*3/uL (ref 0.00–0.07)
Basophils Absolute: 0 10*3/uL (ref 0.0–0.1)
Basophils Relative: 0 %
Eosinophils Absolute: 0 10*3/uL (ref 0.0–0.5)
Eosinophils Relative: 0 %
HCT: 42 % (ref 39.0–52.0)
Hemoglobin: 13.8 g/dL (ref 13.0–17.0)
Immature Granulocytes: 0 %
Lymphocytes Relative: 36 %
Lymphs Abs: 3.1 10*3/uL (ref 0.7–4.0)
MCH: 29.9 pg (ref 26.0–34.0)
MCHC: 32.9 g/dL (ref 30.0–36.0)
MCV: 90.9 fL (ref 80.0–100.0)
Monocytes Absolute: 1.1 10*3/uL — ABNORMAL HIGH (ref 0.1–1.0)
Monocytes Relative: 13 %
Neutro Abs: 4.5 10*3/uL (ref 1.7–7.7)
Neutrophils Relative %: 51 %
Platelets: 126 10*3/uL — ABNORMAL LOW (ref 150–400)
RBC: 4.62 MIL/uL (ref 4.22–5.81)
RDW: 12.7 % (ref 11.5–15.5)
WBC: 8.8 10*3/uL (ref 4.0–10.5)
nRBC: 0 % (ref 0.0–0.2)

## 2020-02-23 LAB — PROTIME-INR
INR: 1 (ref 0.8–1.2)
Prothrombin Time: 12.4 seconds (ref 11.4–15.2)

## 2020-02-23 MED ORDER — FENTANYL CITRATE (PF) 100 MCG/2ML IJ SOLN
INTRAMUSCULAR | Status: AC
  Filled 2020-02-23: qty 2

## 2020-02-23 MED ORDER — MIDAZOLAM HCL 2 MG/2ML IJ SOLN
INTRAMUSCULAR | Status: AC
  Filled 2020-02-23: qty 2

## 2020-02-23 MED ORDER — HEPARIN SOD (PORK) LOCK FLUSH 100 UNIT/ML IV SOLN
500.0000 [IU] | Freq: Once | INTRAVENOUS | Status: AC
  Administered 2020-02-23: 500 [IU] via INTRAVENOUS
  Filled 2020-02-23: qty 5

## 2020-02-23 MED ORDER — SODIUM CHLORIDE 0.9 % IV SOLN: INTRAVENOUS | Status: DC

## 2020-02-23 MED ORDER — LIDOCAINE-EPINEPHRINE 1 %-1:100000 IJ SOLN
INTRAMUSCULAR | Status: AC | PRN
  Administered 2020-02-23: 5 mL

## 2020-02-23 MED ORDER — MIDAZOLAM HCL 2 MG/2ML IJ SOLN
INTRAMUSCULAR | Status: AC | PRN
  Administered 2020-02-23: 1 mg via INTRAVENOUS
  Administered 2020-02-23: 0.5 mg via INTRAVENOUS

## 2020-02-23 MED ORDER — FENTANYL CITRATE (PF) 100 MCG/2ML IJ SOLN
INTRAMUSCULAR | Status: AC | PRN
Start: 2020-02-23 — End: 2020-02-23
  Administered 2020-02-23 (×2): 25 ug via INTRAVENOUS
  Administered 2020-02-23: 50 ug via INTRAVENOUS

## 2020-02-23 MED ORDER — LIDOCAINE-EPINEPHRINE (PF) 2 %-1:200000 IJ SOLN
INTRAMUSCULAR | Status: AC
  Filled 2020-02-23: qty 20

## 2020-02-23 NOTE — Discharge Instructions (Signed)
Open Lymph Node Biopsy, Care After This sheet gives you information about how to care for yourself after your procedure. Your health care provider may also give you more specific instructions. If you have problems or questions, contact your health care provider. What can I expect after the procedure? After the procedure, it is common to have:  Bruising.  Soreness.  Mild swelling. Follow these instructions at home: Medicines  Take over-the-counter and prescription medicines only as told by your health care provider.  If you were prescribed an antibiotic medicine, take it as told by your health care provider. Do not stop taking the antibiotic even if you start to feel better. Incision care   Follow instructions from your health care provider about how to take care of your incision. Make sure you: ? Wash your hands with soap and water before and after you change your bandage (dressing). If soap and water are not available, use hand sanitizer. ? Change your dressing as told by your health care provider. ? Leave stitches (sutures), skin glue, or adhesive strips in place. These skin closures may need to stay in place for 2 weeks or longer. If adhesive strip edges start to loosen and curl up, you may trim the loose edges. Do not remove adhesive strips completely unless your health care provider tells you to do that.  Check your incision area every day for signs of infection. Check for: ? More redness, swelling, or pain. ? Fluid or blood. ? Warmth. ? Pus or a bad smell. Driving  Do not drive for 24 hours if you were given a sedative during your procedure.  Do not drive or use heavy machinery while taking prescription pain medicine. General instructions  Return to your normal activities as told by your health care provider. Ask your health care provider what activities are safe for you.  Do not take baths, swim, or use a hot tub until your health care provider approves. Ask your health  care provider if you may take showers. You may only be allowed to take sponge baths.  Keep all follow-up visits as told by your health care provider. This is important. Contact a health care provider if:  You have more redness, swelling, or pain around your incision.  You have fluid or blood coming from your incision.  Your incision feels warm to the touch.  You have pus or a bad smell coming from your incision.  You have a fever.  You have pain or numbness that gets worse or lasts longer than a few days. Summary  After a lymph node biopsy, it is common to have bruising, soreness, and mild swelling.  Follow your health care provider's instructions about taking care of yourself at home. You will be told how to take medicines, take care of your incision, and check for infection.  Return to your normal activities as told by your health care provider. Ask your health care provider what activities are safe for you.  Contact a health care provider if you have more redness, swelling, or pain around your incision, you have a fever, or you have worsening pain or numbness. This information is not intended to replace advice given to you by your health care provider. Make sure you discuss any questions you have with your health care provider. Document Revised: 10/18/2017 Document Reviewed: 10/18/2017 Elsevier Patient Education  [place]. Moderate Conscious Sedation, Adult, Care After These instructions provide you with information about caring for yourself after your procedure. Your health  care provider may also give you more specific instructions. Your treatment has been planned according to current medical practices, but problems sometimes occur. Call your health care provider if you have any problems or questions after your procedure. What can I expect after the procedure? After your procedure, it is common:  To feel sleepy for several hours.  To feel clumsy and have poor balance  for several hours.  To have poor judgment for several hours.  To vomit if you eat too soon. Follow these instructions at home: For at least 24 hours after the procedure:   Do not: ? Participate in activities where you could fall or become injured. ? Drive. ? Use heavy machinery. ? Drink alcohol. ? Take sleeping pills or medicines that cause drowsiness. ? Make important decisions or sign legal documents. ? Take care of children on your own.  Rest. Eating and drinking  Follow the diet recommended by your health care provider.  If you vomit: ? Drink water, juice, or soup when you can drink without vomiting. ? Make sure you have little or no nausea before eating solid foods. General instructions  Have a responsible adult stay with you until you are awake and alert.  Take over-the-counter and prescription medicines only as told by your health care provider.  If you smoke, do not smoke without supervision.  Keep all follow-up visits as told by your health care provider. This is important. Contact a health care provider if:  You keep feeling nauseous or you keep vomiting.  You feel light-headed.  You develop a rash.  You have a fever. Get help right away if:  You have trouble breathing. This information is not intended to replace advice given to you by your health care provider. Make sure you discuss any questions you have with your health care provider. Document Revised: 02/23/2017 Document Reviewed: 07/03/2015 Elsevier Patient Education  2020 [employer].

## 2020-02-23 NOTE — H&P (Addendum)
Referring Physician(s): Brunetta Genera  Supervising Physician: Sandi Mariscal  Patient Status:  [facility] OP  Chief Complaint: "I'm having a biopsy"   Subjective: Patient familiar to our service from Port-A-Cath placement in 2020.  He has a history of diffuse large B-cell lymphoma in 2020, status post chemotherapy. Recent PET scan has revealed:  1. Increasingly hypermetabolic lymph nodes in the neck, chest, abdomen and pelvis and newly hypermetabolic spleen findings consistent with worsening lymphoma. 2. Focal hypermetabolism in segment 4 of the liver without a definite CT correlate. Recommend attention on follow-up. 3. Large right inguinal hernia contains unobstructed small bowel. 4. Aortic atherosclerosis (ICD10-I70.0). Coronary artery Calcification.  He presents today for image guided right cervical lymph node biopsy for further evaluation.  Platelet count 126k.  He denies fever, headache, chest pain, dyspnea, cough, abdominal/back pain, nausea, vomiting or bleeding.  Additional medical history as below.  Past Medical History:  Diagnosis Date  . Cancer ([place])    PROSTATE  . History of colonic polyps   . History of kidney stones   . Hypertension   . Inguinal hernia 03/2002  . Pneumonia    "years ago"   Past Surgical History:  Procedure Laterality Date  . COLONOSCOPY    . DIRECT LARYNGOSCOPY N/A 07/08/2018   Procedure: DIRECT LARYNGOSCOPY;  Surgeon: Leta Baptist, MD;  Location: [place];  Service: ENT;  Laterality: N/A;  . ESOPHAGOSCOPY N/A 07/08/2018   Procedure: ESOPHAGOSCOPY;  Surgeon: Leta Baptist, MD;  Location: [place];  Service: ENT;  Laterality: N/A;  . FLEXIBLE BRONCHOSCOPY N/A 07/08/2018   Procedure: FLEXIBLE BRONCHOSCOPY;  Surgeon: Leta Baptist, MD;  Location: [place];  Service: ENT;  Laterality: N/A;  . HERNIA REPAIR Left    inguinal hernia  . IR IMAGING GUIDED PORT INSERTION  07/30/2018  . MASS BIOPSY Right 07/08/2018   Procedure: RIGHT NECK MASS EXCISIONAL BIOPSY;  Surgeon:  Leta Baptist, MD;  Location: [place];  Service: ENT;  Laterality: Right;  . PATELLA FRACTURE SURGERY Left   . PROSTATE SURGERY  10/2005   RADIAL PROSTATECTOMY  . ROTATOR CUFF REPAIR Bilateral      Allergies: Lisinopril and Penicillins  Medications: Prior to Admission medications   Medication Sig Start Date End Date Taking? Authorizing Provider  Carboxymethylcellulose Sodium (ARTIFICIAL TEARS OP) Place 1 drop into both eyes daily as needed (dry eyes).   Yes [provider]  latanoprost (XALATAN) 0.005 % ophthalmic solution Place 1 drop into both eyes at bedtime.   Yes [provider]  losartan-hydrochlorothiazide (HYZAAR) 50-12.5 MG tablet TAKE 1 TABLET BY MOUTH EVERY DAY 01/28/20  Yes Denita Lung, MD  pravastatin (PRAVACHOL) 40 MG tablet Take 1 tablet by mouth once daily 11/27/19  Yes Denita Lung, MD  lidocaine-prilocaine (EMLA) cream Apply to affected area once 07/26/18   Brunetta Genera, MD     Vital Signs: BP (!) 155/76 (BP Location: Left Arm)   Pulse 65   Temp 98.6 F (37 C) (Oral)   Resp 18   SpO2 100%   Physical Exam awake, alert.  Chest clear to auscultation bilaterally.  Clean, intact left chest wall Port-A-Cath.  Heart with regular rate and rhythm.  Abdomen soft, positive bowel sounds, nontender. Rt ing hernia;  No lower extremity edema.  Imaging: No results found.  Labs:  CBC: Recent Labs    09/11/19 1425 12/11/19 1031 02/12/20 0857 02/23/20 1140  WBC 6.7 6.7 8.0 8.8  HGB 13.9 13.8 13.2 13.8  HCT 41.7 41.7 40.4  42.0  PLT 141* 115* 122* 126*    COAGS: Recent Labs    02/23/20 1140  INR 1.0    BMP: Recent Labs    03/13/19 0908 03/13/19 0908 06/12/19 0843 09/11/19 1425 12/11/19 1031 02/12/20 0857  NA 141   < > 139 140 138 141  K 3.7   < > 3.5 3.5 3.9 3.7  CL 104   < > 102 104 102 102  CO2 26   < > _0 GLUCOSE 109*   < > 134* 88 81 107*  BUN 21   < > _1 CALCIUM 8.9   < > 8.4* 8.7* 9.0 8.7*  CREATININE  0.82   < > 0.83 0.92 0.84 0.93  GFRNONAA >60   < > >60 >60 >60 >60  GFRAA >60  --  >60 >60 >60  --    < > = values in this interval not displayed.    LIVER FUNCTION TESTS: Recent Labs    06/12/19 0843 09/11/19 1425 12/11/19 1031 02/12/20 0857  BILITOT 0.6 0.6 0.6 0.5  AST _2 ALT _3 ALKPHOS 59 67 64 70  PROT 6.9 7.3 7.2 7.0  ALBUMIN 3.9 4.0 3.9 3.8    Assessment and Plan: Patient with history of prostate cancer 2007 and diffuse large B-cell lymphoma in 2020, status post chemotherapy. Recent PET scan has revealed:  1. Increasingly hypermetabolic lymph nodes in the neck, chest, abdomen and pelvis and newly hypermetabolic spleen findings consistent with worsening lymphoma. 2. Focal hypermetabolism in segment 4 of the liver without a definite CT correlate. Recommend attention on follow-up. 3. Large right inguinal hernia contains unobstructed small bowel. 4. Aortic atherosclerosis (ICD10-I70.0). Coronary artery Calcification.  He presents today for image guided right cervical lymph node biopsy for further evaluation.  Platelet count 126k.Risks and benefits of procedure was discussed with the patient  including, but not limited to bleeding, infection, damage to adjacent structures or low yield requiring additional tests.  All of the questions were answered and there is agreement to proceed.  Consent signed and in chart.     Electronically Signed: D. Rowe Robert, PA-C 02/23/2020, 12:08 PM   I spent a total of 20 minutes at the the patient's bedside AND on the patient's hospital floor or unit, greater than 50% of which was counseling/coordinating care for image guided right cervical lymph node biopsy

## 2020-02-23 NOTE — Procedures (Signed)
Pre Procedure Dx: Lymphoma Post Procedural Dx: Same  Technically successful US guided biopsy of right cervical lymph node.  EBL: Trace No immediate complications.   Ronny Bacon, MD Pager #: [phone]

## 2020-02-24 ENCOUNTER — Telehealth: Payer: Self-pay | Admitting: Hematology

## 2020-02-24 NOTE — Telephone Encounter (Signed)
Rescheduled appt per 11/30 sch msg. Pt aware of appt change per sch msg.

## 2020-02-25 ENCOUNTER — Inpatient Hospital Stay: Payer: Medicare Other | Admitting: Hematology

## 2020-02-26 ENCOUNTER — Inpatient Hospital Stay: Payer: Medicare Other | Attending: Hematology | Admitting: Hematology

## 2020-02-26 DIAGNOSIS — C8330 Diffuse large B-cell lymphoma, unspecified site: Secondary | ICD-10-CM | POA: Insufficient documentation

## 2020-02-26 DIAGNOSIS — Z8546 Personal history of malignant neoplasm of prostate: Secondary | ICD-10-CM | POA: Insufficient documentation

## 2020-02-26 DIAGNOSIS — C8331 Diffuse large B-cell lymphoma, lymph nodes of head, face, and neck: Secondary | ICD-10-CM | POA: Diagnosis not present

## 2020-02-26 DIAGNOSIS — Z79899 Other long term (current) drug therapy: Secondary | ICD-10-CM | POA: Insufficient documentation

## 2020-02-26 DIAGNOSIS — Z87891 Personal history of nicotine dependence: Secondary | ICD-10-CM | POA: Insufficient documentation

## 2020-02-26 DIAGNOSIS — Z5111 Encounter for antineoplastic chemotherapy: Secondary | ICD-10-CM | POA: Insufficient documentation

## 2020-02-26 DIAGNOSIS — Z5112 Encounter for antineoplastic immunotherapy: Secondary | ICD-10-CM | POA: Insufficient documentation

## 2020-02-26 LAB — SURGICAL PATHOLOGY

## 2020-02-26 NOTE — Progress Notes (Signed)
HEMATOLOGY/ONCOLOGY CLINIC NOTE  Date of Service: 02/26/2020  Patient Care Team: Denita Lung, MD as PCP - General (Family Medicine)  CHIEF COMPLAINTS/PURPOSE OF CONSULTATION:  Diffuse Large B-Cell Lymphoma  HISTORY OF PRESENTING ILLNESS:   Mario Garcia is a wonderful 86 y.o. male who has been referred to [place] by Dr. Jill Alexanders for evaluation and management of Diffuse Large B-Cell Lymphoma. The pt reports that he is doing well overall.   The pt reports that he feels that he has been a little more tired recently. He first noticed some "nodules" on his neck appear "4-6 weeks ago." He notes that these nodules haven't been painful. He appears to have presented to care with his PCP on 05/28/18, Dr. Redmond School. He notes that he has noticed that he has been sweating more in the last year. He denies noticing any other new lumps or bumps. He denies any fevers, chills, drenching night sweats, or unexpected weight loss. He notes that he has had some changes in swallowing over the last 6 months, which he characterizes as "getting strangled on his saliva every now and then." He denies abdominal pains, acid reflux, changes in bowel habits, leg swelling, skin rashes. He endorses a deep pain "every once in a while," in his left groin. He denies headaches. He endorses glaucoma and a history of cataracts. He sees Dr. Hetty Blend in ophthalmology. He denies any other concern in the last 6 months.  The pt notes that he lives independently with his wife and still is able to do all he wants to do. He walks on trails with his wife and notes that he can generally walk as far as he likes to.  The pt notes that he had prostate cancer in 2008 or 2009, s/p prostatectomy. He did not require RT nor systemic treatments. He denies lung, heart or kidney problems.  Of note prior to the patient's visit today, pt has had a CT Neck completed on 06/05/18 with results revealing Pathologic RIGHT-sided level II, III, IV, and V  lymphadenopathy, most consistent with metastatic carcinoma. An obvious primary source is not identified. Tissue sampling is warranted.  Most recent lab results (07/08/18) of CBC and CMP is as follows: all values are WNL except for PLT at 129k.  On review of systems, pt reports slightly more tired, neck nodules, some changes in swallowing, staying active, and denies fevers, chills, drenching night sweats, unexpected weight loss, abdominal pains, changes in bowel habits, skin rashes, leg swelling, CP, SOB, difficulty breathing, headaches, other lumps or bumps, skin lesions, mouth sores, pain along the spine, back pain, leg swelling, and any other symptoms.   On PMHx the pt reports localized Prostate cancer in 2008 s/p prostatectomy.  On Social Hx the pt reports that he quit smoking over 50 years ago. He notes that he consumes about 1 glass of wine every day, without concerns for excessive drinking. 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 He denies concern for chemical or radiation exposure. On Family Hx the pt reports wife with Stage IV Non Hodgkin's Lymphoma, paternal grandmother with leukemia in her 8s, father with unspecified abdominal cancer.   Interval History:  I connected with  Arshan Jabs Christopher on 02/26/20 by telephone and verified that I am speaking with the correct person using two identifiers.   I discussed the limitations of evaluation and management by telemedicine. The patient expressed understanding and agreed to proceed.  Other persons participating in  the visit and their role in the encounter:       -Yevette Edwards, Medical Scribe  Patient's location: Home Provider's location: [place] at [employer] returns today for management and evaluation of his Diffuse Large B-Cell Lymphoma. The patient's last visit with [place] was on 02/12/2020. The pt reports that he is doing well overall.  The pt reports that he has a new forehead lesion that has not  healed over the last three weeks.   On review of systems, pt reports new skin lesion and denies any other symptoms.   MEDICAL HISTORY:  Past Medical History:  Diagnosis Date  . Cancer ([place])    PROSTATE  . History of colonic polyps   . History of kidney stones   . Hypertension   . Inguinal hernia 03/2002  . Pneumonia    "years ago"    SURGICAL HISTORY: Past Surgical History:  Procedure Laterality Date  . COLONOSCOPY    . DIRECT LARYNGOSCOPY N/A 07/08/2018   Procedure: DIRECT LARYNGOSCOPY;  Surgeon: Leta Baptist, MD;  Location: [place];  Service: ENT;  Laterality: N/A;  . ESOPHAGOSCOPY N/A 07/08/2018   Procedure: ESOPHAGOSCOPY;  Surgeon: Leta Baptist, MD;  Location: [place];  Service: ENT;  Laterality: N/A;  . FLEXIBLE BRONCHOSCOPY N/A 07/08/2018   Procedure: FLEXIBLE BRONCHOSCOPY;  Surgeon: Leta Baptist, MD;  Location: [place];  Service: ENT;  Laterality: N/A;  . HERNIA REPAIR Left    inguinal hernia  . IR IMAGING GUIDED PORT INSERTION  07/30/2018  . MASS BIOPSY Right 07/08/2018   Procedure: RIGHT NECK MASS EXCISIONAL BIOPSY;  Surgeon: Leta Baptist, MD;  Location: [place];  Service: ENT;  Laterality: Right;  . PATELLA FRACTURE SURGERY Left   . PROSTATE SURGERY  10/2005   RADIAL PROSTATECTOMY  . ROTATOR CUFF REPAIR Bilateral     SOCIAL HISTORY: Social History   Socioeconomic History  . Marital status: Married    Spouse name: Not on file  . Number of children: Not on file  . Years of education: Not on file  . Highest education level: Not on file  Occupational History  . Not on file  Tobacco Use  . Smoking status: Former Research scientist (life sciences)  . Smokeless tobacco: Never Used  . Tobacco comment: stopped 60 years ago  Vaping Use  . Vaping Use: Never used  Substance and Sexual Activity  . Alcohol use: Yes    Alcohol/week: 6.0 standard drinks    Types: 6 Standard drinks or equivalent per week  . Drug use: No  . Sexual activity: Not Currently  Other Topics Concern  . Not on file  Social History Narrative   . Not on file   Social Determinants of Health   Financial Resource Strain:   . Difficulty of Paying Living Expenses: Not on file  Food Insecurity:   . Worried About Charity fundraiser in the Last Year: Not on file  . Ran Out of Food in the Last Year: Not on file  Transportation Needs:   . Lack of Transportation (Medical): Not on file  . Lack of Transportation (Non-Medical): Not on file  Physical Activity:   . Days of Exercise per Week: Not on file  . Minutes of Exercise per Session: Not on file  Stress:   . Feeling of Stress : Not on file  Social Connections:   . Frequency of Communication with Friends and Family: Not on file  . Frequency of Social Gatherings with Friends and Family: Not on  file  . Attends Religious Services: Not on file  . Active Member of Clubs or Organizations: Not on file  . Attends Archivist Meetings: Not on file  . Marital Status: Not on file  Intimate Partner Violence:   . Fear of Current or Ex-Partner: Not on file  . Emotionally Abused: Not on file  . Physically Abused: Not on file  . Sexually Abused: Not on file    FAMILY HISTORY: Family History  Problem Relation Age of Onset  . Cancer Father   . Cancer Brother     ALLERGIES:  is allergic to lisinopril and penicillins.  MEDICATIONS:  Current Outpatient Medications  Medication Sig Dispense Refill  . Carboxymethylcellulose Sodium (ARTIFICIAL TEARS OP) Place 1 drop into both eyes daily as needed (dry eyes).    Marland Kitchen latanoprost (XALATAN) 0.005 % ophthalmic solution Place 1 drop into both eyes at bedtime.    . lidocaine-prilocaine (EMLA) cream Apply to affected area once 30 g 3  . losartan-hydrochlorothiazide (HYZAAR) 50-12.5 MG tablet TAKE 1 TABLET BY MOUTH EVERY DAY 90 tablet 0  . pravastatin (PRAVACHOL) 40 MG tablet Take 1 tablet by mouth once daily 90 tablet 0   No current facility-administered medications for this visit.    REVIEW OF SYSTEMS:   A 10+ POINT REVIEW OF SYSTEMS WAS  OBTAINED including neurology, dermatology, psychiatry, cardiac, respiratory, lymph, extremities, GI, GU, Musculoskeletal, constitutional, breasts, reproductive, HEENT.  All pertinent positives are noted in the HPI.  All others are negative.   PHYSICAL EXAMINATION: ECOG FS:1 - Symptomatic but completely ambulatory  There were no vitals filed for this visit. Wt Readings from Last 3 Encounters:  02/12/20 172 lb (78 kg)  12/11/19 169 lb 11.2 oz (77 kg)  09/11/19 172 lb 8 oz (78.2 kg)   There is no height or weight on file to calculate BMI.    Telehealth visit 02/26/2020  LABORATORY DATA:  I have reviewed the data as listed  . CBC Latest Ref Rng & Units 02/23/2020 02/12/2020 12/11/2019  WBC 4.0 - 10.5 K/uL 8.8 8.0 6.7  Hemoglobin 13.0 - 17.0 g/dL 13.8 13.2 13.8  Hematocrit 39 - 52 % 42.0 40.4 41.7  Platelets 150 - 400 K/uL 126(L) 122(L) 115(L)    . CMP Latest Ref Rng & Units 02/12/2020 12/11/2019 09/11/2019  Glucose 70 - 99 mg/dL 107(H) 81 88  BUN 8 - 23 mg/dL _0 Creatinine 0.61 - 1.24 mg/dL 0.93 0.84 0.92  Sodium 135 - 145 mmol/L 141 138 140  Potassium 3.5 - 5.1 mmol/L 3.7 3.9 3.5  Chloride 98 - 111 mmol/L 102 102 104  CO2 22 - 32 mmol/L _1 Calcium 8.9 - 10.3 mg/dL 8.7(L) 9.0 8.7(L)  Total Protein 6.5 - 8.1 g/dL 7.0 7.2 7.3  Total Bilirubin 0.3 - 1.2 mg/dL 0.5 0.6 0.6  Alkaline Phos 38 - 126 U/L 70 64 67  AST 15 - 41 U/L _2 ALT 0 - 44 U/L _3 . Lab Results  Component Value Date   LDH 169 02/12/2020   03/03/2019 NM PET Image Restag (PS) Skull Base To Thigh (Accession [id number]):   07/08/18 Right Cervical Tissue Biopsy:    RADIOGRAPHIC STUDIES: I have personally reviewed the radiological images as listed and agreed with the findings in the report. NM PET Image Restag (PS) Skull Base To Thigh  Result Date: 02/09/2020 CLINICAL DATA:  Subsequent treatment strategy for large B-cell lymphoma. EXAM: NUCLEAR MEDICINE PET  SKULL BASE TO THIGH  TECHNIQUE: 8.4 mCi F-18 FDG was injected intravenously. Full-ring PET imaging was performed from the skull base to thigh after the radiotracer. CT data was obtained and used for attenuation correction and anatomic localization. Fasting blood glucose: 99 mg/dl COMPARISON:  06/05/2019. FINDINGS: Mediastinal blood pool activity: SUV max 2.1 Liver activity: SUV max 3.6 NECK: Stable right neck lymph nodes with an index 8 mm level 2 node (4/28), SUV max 6.3. No new hypermetabolic lymph nodes. Incidental CT findings: None. CHEST: Bilateral axillary lymph nodes are stable in size but increased in metabolism. Index high left axillary lymph node measures 7 mm (4/46) with an SUV max of 7.3 compared to 3.2 previously. New hypermetabolism within supraclavicular lymph nodes. Index left supraclavicular lymph node measures 5 mm (4/43) with an SUV max of 4.8. Hypermetabolic internal mammary and mediastinal/hilar lymph nodes. Index prevascular lymph node measures 7 mm (4/63) with an SUV max of 3.0, compared to 1.9 previously. No hypermetabolic pulmonary nodules. Incidental CT findings: Left IJ Port-A-Cath terminates in the high right atrium. Atherosclerotic calcification of the aorta, aortic valve and coronary arteries. Heart is enlarged. No pericardial effusion. A few scattered subpleural pulmonary nodules measure up to 5 mm in the right lower lobe (8/48), unchanged and too small for PET resolution. No pleural effusion. ABDOMEN/PELVIS: Focal hypermetabolism is seen in the liver, adjacent to the gallbladder fossa, without a definite CT correlate. Spleen is hypermetabolic, SUV max 9.5. No abnormal hypermetabolism in the adrenal glands or pancreas. Periportal lymph nodes measure up to 9 mm (4/109) with an SUV max 7.0, compared to 3.9 previously. Abdominal and pelvic retroperitoneal lymph nodes have increased in hypermetabolism. Index aortocaval lymph node measures 6 mm (4/124) with an SUV max of 7.4 previously 2.1. Index left external  iliac lymph node measures 9 mm (4/173) with an SUV max of 8.4, compared to 6.2 previously. Finally, there are hypermetabolic mesenteric lymph nodes. Index ileocolic mesenteric lymph node measures 7 mm (4/142) with an SUV max of 5.9. Incidental CT findings: Liver, gallbladder, adrenal glands, kidneys, spleen, pancreas stomach and bowel are grossly unremarkable. Atherosclerotic calcification of the aorta without aneurysm. Right inguinal hernia contains unobstructed small bowel. SKELETON: No abnormal hypermetabolism. Incidental CT findings: Degenerative changes in the spine. IMPRESSION: 1. Increasingly hypermetabolic lymph nodes in the neck, chest, abdomen and pelvis and newly hypermetabolic spleen findings consistent with worsening lymphoma. 2. Focal hypermetabolism in segment 4 of the liver without a definite CT correlate. Recommend attention on follow-up. 3. Large right inguinal hernia contains unobstructed small bowel. 4. Aortic atherosclerosis (ICD10-I70.0). Coronary artery calcification. Electronically Signed   By: Lorin Picket M.D.   On: 02/09/2020 09:03   [place] CORE BIOPSY (LYMPH NODES)  Result Date: 02/23/2020 INDICATION: History of diffuse large B-cell lymphoma now with hypermetabolic adenopathy. Please perform ultrasound-guided biopsy of hypermetabolic right cervical lymph node for tissue diagnostic purposes. EXAM: ULTRASOUND-GUIDED RIGHT CERVICAL LYMPH NODE BIOPSY COMPARISON:  PET-CT-02/09/2020 MEDICATIONS: None ANESTHESIA/SEDATION: Moderate (conscious) sedation was employed during this procedure. A total of Versed 1.5 mg and Fentanyl 20 mcg was administered intravenously. Moderate Sedation Time: 10 minutes. The patient's level of consciousness and vital signs were monitored continuously by radiology nursing throughout the procedure under my direct supervision. COMPLICATIONS: None immediate. TECHNIQUE: Informed written consent was obtained from the patient after a discussion of the risks, benefits and  alternatives to treatment. Questions regarding the procedure were encouraged and answered. Initial ultrasound scanning demonstrated an approximately 1.1 x 0.7 cm right lateral cervical lymph node,  likely correlating with the hypermetabolic cervical lymph node seen on preceding PET-CT image 30, series 4. An ultrasound image was saved for documentation purposes. The procedure was planned. A timeout was performed prior to the initiation of the procedure. The operative was prepped and draped in the usual sterile fashion, and a sterile drape was applied covering the operative field. A timeout was performed prior to the initiation of the procedure. Local anesthesia was provided with 1% lidocaine with epinephrine. Under direct ultrasound guidance, an 18 gauge core needle device was utilized to obtain to obtain 6 core needle biopsies of the hypermetabolic right lateral cervical lymph node. The samples were placed in saline and submitted to pathology. The needle was removed and hemostasis was achieved with manual compression. Post procedure scan was negative for significant hematoma. A dressing was placed. The patient tolerated the procedure well without immediate postprocedural complication. IMPRESSION: Technically successful ultrasound guided biopsy of hypermetabolic right lateral cervical lymph node. Electronically Signed   By: Sandi Mariscal M.D.   On: 02/23/2020 14:59    ASSESSMENT & PLAN:   87 y.o. male with  1. Diffuse Large B-Cell Lymphoma, Stage IV Presenting without constitutional symptoms. Palpable right cervical and supraclavicular lymphadenopathy.   07/08/18 Right cervical soft tissue biopsy revealed Diffuse Large B-Cell Lymphoma, germinal center type   06/05/18 CT Neck revealed Pathologic RIGHT-sided level II, III, IV, and V lymphadenopathy, most consistent with metastatic carcinoma. An obvious primary source is not identified. Tissue sampling is warranted.  07/16/18 Hep B and Hep C  negative  07/17/18 ECHO revealed LV EF of 60-65%  07/22/18 PET/CT revealed "Hypermetabolic adenopathy especially concentrated in the right neck but also in the left neck, chest, abdomen, and pelvis. The adenopathy is primarily Deauville 5 although some few scattered smaller lesions are Deauville 4. 2. Diffuse abnormal splenic activity, Deauville 5, without overt splenomegaly. 3. There is also hypermetabolic Deauville 5 activity in the mildly thickened distal appendix, and raising suspicion for involvement of the appendix. 4. Other imaging findings of potential clinical significance: Aortic Atherosclerosis. Coronary atherosclerosis."  10/03/2018 PET scan revealed "Interval response to therapy with stable to decreased size of lymph nodes on CT and generalized decrease in hypermetabolism of the abnormal nodes. Hypermetabolism in the lymph nodes today is compatible with a combination of Deauville 3 and Deauville 4 disease. Stable tiny bilateral pulmonary nodules. Increase radiotracer accumulation in the marrow space on today's study, presumably representing marrow stimulatory effects of therapy."   PLAN: -Advised pt again that although we are seeing an increase in the activity of his lymph nodes there was no growth detected on last scans, which is not the most concerning for a high-grade process.  -Advised pt that after a conversation with Dr. Gari Crown today he does not feel that this is a recurrence of pt's DLBCL.  -Advised pt that Cd1 positivity and presentation do suggest a lymphoma, most likely a slow-growing form based on low Ki67 score, but more molecular testing is required. -Recommend pt f/u with Dermatology for lesion evaluation. -Will see back in 2 weeks with labs   FOLLOW UP: RTC with Dr Irene Limbo with labs in 2 weeks   The total time spent in the appt was 20 minutes and more than 50% was on counseling and direct patient cares.  All of the patient's questions were answered with apparent  satisfaction. The patient knows to call the clinic with any problems, questions or concerns.   Sullivan Lone MD [place] AAHIVMS [facility] Hematology/Oncology Physician [place]  [place]  (Office):       [phone] (Work cell):  [phone] (Fax):           [phone]  02/26/2020 12:30 PM  I, Yevette Edwards, am acting as a scribe for Dr. Sullivan Lone.   .I have reviewed the above documentation for accuracy and completeness, and I agree with the above. Brunetta Genera MD

## 2020-03-05 ENCOUNTER — Encounter (HOSPITAL_COMMUNITY): Payer: Self-pay | Admitting: Hematology

## 2020-03-05 ENCOUNTER — Telehealth: Payer: Self-pay | Admitting: Hematology

## 2020-03-05 NOTE — Telephone Encounter (Signed)
Scheduled per 12/02 los, patient has been called and notified.

## 2020-03-08 ENCOUNTER — Other Ambulatory Visit: Payer: Self-pay | Admitting: Family Medicine

## 2020-03-08 DIAGNOSIS — E785 Hyperlipidemia, unspecified: Secondary | ICD-10-CM

## 2020-03-09 ENCOUNTER — Telehealth: Payer: Self-pay | Admitting: Hematology

## 2020-03-09 NOTE — Telephone Encounter (Signed)
Scheduled per 12/02 los, patient has been called and notified.

## 2020-03-10 ENCOUNTER — Other Ambulatory Visit: Payer: Self-pay | Admitting: *Deleted

## 2020-03-10 DIAGNOSIS — C8331 Diffuse large B-cell lymphoma, lymph nodes of head, face, and neck: Secondary | ICD-10-CM

## 2020-03-11 ENCOUNTER — Other Ambulatory Visit: Payer: Self-pay

## 2020-03-11 ENCOUNTER — Inpatient Hospital Stay (HOSPITAL_BASED_OUTPATIENT_CLINIC_OR_DEPARTMENT_OTHER): Payer: Medicare Other | Admitting: Hematology

## 2020-03-11 ENCOUNTER — Inpatient Hospital Stay: Payer: Medicare Other

## 2020-03-11 VITALS — BP 138/54 | HR 60 | Temp 98.1°F | Resp 18 | Ht 69.0 in | Wt 167.2 lb

## 2020-03-11 DIAGNOSIS — Z87891 Personal history of nicotine dependence: Secondary | ICD-10-CM | POA: Diagnosis not present

## 2020-03-11 DIAGNOSIS — C8331 Diffuse large B-cell lymphoma, lymph nodes of head, face, and neck: Secondary | ICD-10-CM

## 2020-03-11 DIAGNOSIS — C8330 Diffuse large B-cell lymphoma, unspecified site: Secondary | ICD-10-CM | POA: Diagnosis not present

## 2020-03-11 DIAGNOSIS — Z5111 Encounter for antineoplastic chemotherapy: Secondary | ICD-10-CM | POA: Diagnosis not present

## 2020-03-11 DIAGNOSIS — C8318 Mantle cell lymphoma, lymph nodes of multiple sites: Secondary | ICD-10-CM | POA: Diagnosis not present

## 2020-03-11 DIAGNOSIS — Z5112 Encounter for antineoplastic immunotherapy: Secondary | ICD-10-CM | POA: Diagnosis not present

## 2020-03-11 DIAGNOSIS — Z7189 Other specified counseling: Secondary | ICD-10-CM | POA: Diagnosis not present

## 2020-03-11 DIAGNOSIS — Z79899 Other long term (current) drug therapy: Secondary | ICD-10-CM | POA: Diagnosis not present

## 2020-03-11 DIAGNOSIS — Z8546 Personal history of malignant neoplasm of prostate: Secondary | ICD-10-CM | POA: Diagnosis not present

## 2020-03-11 LAB — CBC WITH DIFFERENTIAL (CANCER CENTER ONLY)
Abs Immature Granulocytes: 0.04 10*3/uL (ref 0.00–0.07)
Basophils Absolute: 0 10*3/uL (ref 0.0–0.1)
Basophils Relative: 0 %
Eosinophils Absolute: 0 10*3/uL (ref 0.0–0.5)
Eosinophils Relative: 0 %
HCT: 41.3 % (ref 39.0–52.0)
Hemoglobin: 13.4 g/dL (ref 13.0–17.0)
Immature Granulocytes: 0 %
Lymphocytes Relative: 46 %
Lymphs Abs: 4.2 10*3/uL — ABNORMAL HIGH (ref 0.7–4.0)
MCH: 29.3 pg (ref 26.0–34.0)
MCHC: 32.4 g/dL (ref 30.0–36.0)
MCV: 90.4 fL (ref 80.0–100.0)
Monocytes Absolute: 1.1 10*3/uL — ABNORMAL HIGH (ref 0.1–1.0)
Monocytes Relative: 12 %
Neutro Abs: 3.9 10*3/uL (ref 1.7–7.7)
Neutrophils Relative %: 42 %
Platelet Count: 133 10*3/uL — ABNORMAL LOW (ref 150–400)
RBC: 4.57 MIL/uL (ref 4.22–5.81)
RDW: 12.4 % (ref 11.5–15.5)
WBC Count: 9.3 10*3/uL (ref 4.0–10.5)
nRBC: 0 % (ref 0.0–0.2)

## 2020-03-11 LAB — CMP (CANCER CENTER ONLY)
ALT: 16 U/L (ref 0–44)
AST: 23 U/L (ref 15–41)
Albumin: 4.2 g/dL (ref 3.5–5.0)
Alkaline Phosphatase: 91 U/L (ref 38–126)
Anion gap: 11 (ref 5–15)
BUN: 17 mg/dL (ref 8–23)
CO2: 28 mmol/L (ref 22–32)
Calcium: 9.2 mg/dL (ref 8.9–10.3)
Chloride: 102 mmol/L (ref 98–111)
Creatinine: 0.91 mg/dL (ref 0.61–1.24)
GFR, Estimated: >60 mL/min (ref >60)
Glucose, Bld: 65 mg/dL — ABNORMAL LOW (ref 70–99)
Potassium: 3.8 mmol/L (ref 3.5–5.1)
Sodium: 141 mmol/L (ref 135–145)
Total Bilirubin: 0.5 mg/dL (ref 0.3–1.2)
Total Protein: 7.7 g/dL (ref 6.5–8.1)

## 2020-03-11 LAB — LACTATE DEHYDROGENASE: LDH: 151 U/L (ref 98–192)

## 2020-03-11 NOTE — Progress Notes (Signed)
HEMATOLOGY/ONCOLOGY CLINIC NOTE  Date of Service: 03/11/2020  Patient Care Team: Denita Lung, MD as PCP - General (Family Medicine)  CHIEF COMPLAINTS/PURPOSE OF CONSULTATION:  Diffuse Large B-Cell Lymphoma  HISTORY OF PRESENTING ILLNESS:   Mario Garcia is a wonderful 86 y.o. male who has been referred to [place] by Dr. Jill Garcia for evaluation and management of Diffuse Large B-Cell Lymphoma. The pt reports that he is doing well overall.   The pt reports that he feels that he has been a little more tired recently. He first noticed some "nodules" on his neck appear "4-6 weeks ago." He notes that these nodules haven't been painful. He appears to have presented to care with his PCP on 05/28/18, Dr. Redmond Garcia. He notes that he has noticed that he has been sweating more in the last year. He denies noticing any other new lumps or bumps. He denies any fevers, chills, drenching night sweats, or unexpected weight loss. He notes that he has had some changes in swallowing over the last 6 months, which he characterizes as "getting strangled on his saliva every now and then." He denies abdominal pains, acid reflux, changes in bowel habits, leg swelling, skin rashes. He endorses a deep pain "every once in a while," in his left groin. He denies headaches. He endorses glaucoma and a history of cataracts. He sees Dr. Hetty Garcia in ophthalmology. He denies any other concern in the last 6 months.  The pt notes that he lives independently with his wife and still is able to do all he wants to do. He walks on trails with his wife and notes that he can generally walk as far as he likes to.  The pt notes that he had prostate cancer in 2008 or 2009, s/p prostatectomy. He did not require RT nor systemic treatments. He denies lung, heart or kidney problems.  Of note prior to the patient's visit today, pt has had a CT Neck completed on 06/05/18 with results revealing Pathologic RIGHT-sided level II, III, IV, and V  lymphadenopathy, most consistent with metastatic carcinoma. An obvious primary source is not identified. Tissue sampling is warranted.  Most recent lab results (07/08/18) of CBC and CMP is as follows: all values are WNL except for PLT at 129k.  On review of systems, pt reports slightly more tired, neck nodules, some changes in swallowing, staying active, and denies fevers, chills, drenching night sweats, unexpected weight loss, abdominal pains, changes in bowel habits, skin rashes, leg swelling, CP, SOB, difficulty breathing, headaches, other lumps or bumps, skin lesions, mouth sores, pain along the spine, back pain, leg swelling, and any other symptoms.   On PMHx the pt reports localized Prostate cancer in 2008 s/p prostatectomy.  On Social Hx the pt reports that he quit smoking over 50 years ago. He notes that he consumes about 1 glass of wine every day, without concerns for excessive drinking. 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 He denies concern for chemical or radiation exposure. On Family Hx the pt reports wife with Stage IV Non Hodgkin's Lymphoma, paternal grandmother with leukemia in her 40s, father with unspecified abdominal cancer.   Interval History:  Mario Garcia returns today for management and evaluation of his Diffuse Large B-Cell Lymphoma. We are joined today by his granddaughter. The patient's last visit with [place] was on 02/26/2020. The pt reports that he is doing well overall.  The pt reports that he does not feel  very different since our last visit. He endorses more fatigue during the later part of the day but nothing limiting. Pt was experiencing significant issues with his hernia yesterday. It continues to be reducible but is bulging with minimal movement.  Of note since the patient's last visit, pt has had Right Cervical Lymph Node Surgical Pathology [id number]) completed on 02/23/2020 with results revealing "Non-Hodgkin B-cell lymphoma.  Ki-67 generally shows low expression (<5-15%)."  Pt has had FISH Mutation Analysis completed on 02/23/2020 with results revealing "t(11;14): DETECTED". Consistent with Mantle cell lymphoma.  Lab results today (03/11/20) of CBC w/diff and CMP is as follows: all values are WNL except for PLT at 133K, Lymphs Abs at 4.2K, Mono Abs at 1.1K, Glucose at 65. 03/11/2020 LDH at 151  On review of systems, pt reports fatigue and denies fevers, chills, night sweats and any other symptoms.   MEDICAL HISTORY:  Past Medical History:  Diagnosis Date   Cancer [facility])    PROSTATE   History of colonic polyps    History of kidney stones    Hypertension    Inguinal hernia 03/2002   Pneumonia    "years ago"    SURGICAL HISTORY: Past Surgical History:  Procedure Laterality Date   COLONOSCOPY     DIRECT LARYNGOSCOPY N/A 07/08/2018   Procedure: DIRECT LARYNGOSCOPY;  Surgeon: Leta Baptist, MD;  Location: [place];  Service: ENT;  Laterality: N/A;   ESOPHAGOSCOPY N/A 07/08/2018   Procedure: ESOPHAGOSCOPY;  Surgeon: Leta Baptist, MD;  Location: [place];  Service: ENT;  Laterality: N/A;   FLEXIBLE BRONCHOSCOPY N/A 07/08/2018   Procedure: FLEXIBLE BRONCHOSCOPY;  Surgeon: Leta Baptist, MD;  Location: [place];  Service: ENT;  Laterality: N/A;   HERNIA REPAIR Left    inguinal hernia   IR IMAGING GUIDED PORT INSERTION  07/30/2018   MASS BIOPSY Right 07/08/2018   Procedure: RIGHT NECK MASS EXCISIONAL BIOPSY;  Surgeon: Leta Baptist, MD;  Location: [place];  Service: ENT;  Laterality: Right;   PATELLA FRACTURE SURGERY Left    PROSTATE SURGERY  10/2005   RADIAL PROSTATECTOMY   ROTATOR CUFF REPAIR Bilateral     SOCIAL HISTORY: Social History   Socioeconomic History   Marital status: Married    Spouse name: Not on file   Number of children: Not on file   Years of education: Not on file   Highest education level: Not on file  Occupational History   Not on file  Tobacco Use   Smoking status: Former Smoker    Smokeless tobacco: Never Used   Tobacco comment: stopped 60 years ago  Vaping Use   Vaping Use: Never used  Substance and Sexual Activity   Alcohol use: Yes    Alcohol/week: 6.0 standard drinks    Types: 6 Standard drinks or equivalent per week   Drug use: No   Sexual activity: Not Currently  Other Topics Concern   Not on file  Social History Narrative   Not on file   Social Determinants of Health   Financial Resource Strain: Not on file  Food Insecurity: Not on file  Transportation Needs: Not on file  Physical Activity: Not on file  Stress: Not on file  Social Connections: Not on file  Intimate Partner Violence: Not on file    FAMILY HISTORY: Family History  Problem Relation Age of Onset   Cancer Father    Cancer Brother     ALLERGIES:  is allergic to lisinopril and penicillins.  MEDICATIONS:  Current Outpatient Medications  Medication Sig Dispense Refill   Carboxymethylcellulose Sodium (ARTIFICIAL TEARS OP) Place 1 drop into both eyes daily as needed (dry eyes).     latanoprost (XALATAN) 0.005 % ophthalmic solution Place 1 drop into both eyes at bedtime.     lidocaine-prilocaine (EMLA) cream Apply to affected area once 30 g 3   losartan-hydrochlorothiazide (HYZAAR) 50-12.5 MG tablet TAKE 1 TABLET BY MOUTH EVERY DAY 90 tablet 0   pravastatin (PRAVACHOL) 40 MG tablet Take 1 tablet by mouth once daily 90 tablet 0   No current facility-administered medications for this visit.    REVIEW OF SYSTEMS:   A 10+ POINT REVIEW OF SYSTEMS WAS OBTAINED including neurology, dermatology, psychiatry, cardiac, respiratory, lymph, extremities, GI, GU, Musculoskeletal, constitutional, breasts, reproductive, HEENT.  All pertinent positives are noted in the HPI.  All others are negative.   PHYSICAL EXAMINATION: ECOG FS:1 - Symptomatic but completely ambulatory  There were no vitals filed for this visit. Wt Readings from Last 3 Encounters:  02/12/20 172 lb (78 kg)   12/11/19 169 lb 11.2 oz (77 kg)  09/11/19 172 lb 8 oz (78.2 kg)   There is no height or weight on file to calculate BMI.    GENERAL:alert, in no acute distress and comfortable SKIN: no acute rashes, no significant lesions EYES: conjunctiva are pink and non-injected, sclera anicteric OROPHARYNX: MMM, no exudates, no oropharyngeal erythema or ulceration NECK: supple, no JVD LYMPH:  no palpable lymphadenopathy in the cervical, axillary or inguinal regions LUNGS: clear to auscultation b/l with normal respiratory effort HEART: regular rate & rhythm ABDOMEN:  normoactive bowel sounds , non tender, not distended. No palpable hepatosplenomegaly.  Extremity: no pedal edema PSYCH: alert & oriented x 3 with fluent speech NEURO: no focal motor/sensory deficits  LABORATORY DATA:  I have reviewed the data as listed  . CBC Latest Ref Rng & Units 02/23/2020 02/12/2020 12/11/2019  WBC 4.0 - 10.5 K/uL 8.8 8.0 6.7  Hemoglobin 13.0 - 17.0 g/dL 13.8 13.2 13.8  Hematocrit 39.0 - 52.0 % 42.0 40.4 41.7  Platelets 150 - 400 K/uL 126(L) 122(L) 115(L)    . CMP Latest Ref Rng & Units 02/12/2020 12/11/2019 09/11/2019  Glucose 70 - 99 mg/dL 107(H) 81 88  BUN 8 - 23 mg/dL _0 Creatinine 0.61 - 1.24 mg/dL 0.93 0.84 0.92  Sodium 135 - 145 mmol/L 141 138 140  Potassium 3.5 - 5.1 mmol/L 3.7 3.9 3.5  Chloride 98 - 111 mmol/L 102 102 104  CO2 22 - 32 mmol/L _1 Calcium 8.9 - 10.3 mg/dL 8.7(L) 9.0 8.7(L)  Total Protein 6.5 - 8.1 g/dL 7.0 7.2 7.3  Total Bilirubin 0.3 - 1.2 mg/dL 0.5 0.6 0.6  Alkaline Phos 38 - 126 U/L 70 64 67  AST 15 - 41 U/L _2 ALT 0 - 44 U/L _3 . Lab Results  Component Value Date   LDH 169 02/12/2020   02/23/2020 Right Cervical Lymph Node Surgical Pathology [phone]):    02/23/2020 FISH Analysis:   02/23/2020 FISH Mutation Analysis:    03/03/2019 NM PET Image Restag (PS) Skull Base To Thigh (Accession [id number]):   07/08/18 Right Cervical  Tissue Biopsy:    RADIOGRAPHIC STUDIES: I have personally reviewed the radiological images as listed and agreed with the findings in the report. [place] CORE BIOPSY (LYMPH NODES)  Result Date: 02/23/2020 INDICATION: History of diffuse large B-cell lymphoma now with hypermetabolic adenopathy. Please perform ultrasound-guided biopsy of hypermetabolic right  cervical lymph node for tissue diagnostic purposes. EXAM: ULTRASOUND-GUIDED RIGHT CERVICAL LYMPH NODE BIOPSY COMPARISON:  PET-CT-02/09/2020 MEDICATIONS: None ANESTHESIA/SEDATION: Moderate (conscious) sedation was employed during this procedure. A total of Versed 1.5 mg and Fentanyl 20 mcg was administered intravenously. Moderate Sedation Time: 10 minutes. The patient's level of consciousness and vital signs were monitored continuously by radiology nursing throughout the procedure under my direct supervision. COMPLICATIONS: None immediate. TECHNIQUE: Informed written consent was obtained from the patient after a discussion of the risks, benefits and alternatives to treatment. Questions regarding the procedure were encouraged and answered. Initial ultrasound scanning demonstrated an approximately 1.1 x 0.7 cm right lateral cervical lymph node, likely correlating with the hypermetabolic cervical lymph node seen on preceding PET-CT image 30, series 4. An ultrasound image was saved for documentation purposes. The procedure was planned. A timeout was performed prior to the initiation of the procedure. The operative was prepped and draped in the usual sterile fashion, and a sterile drape was applied covering the operative field. A timeout was performed prior to the initiation of the procedure. Local anesthesia was provided with 1% lidocaine with epinephrine. Under direct ultrasound guidance, an 18 gauge core needle device was utilized to obtain to obtain 6 core needle biopsies of the hypermetabolic right lateral cervical lymph node. The samples were placed in saline  and submitted to pathology. The needle was removed and hemostasis was achieved with manual compression. Post procedure scan was negative for significant hematoma. A dressing was placed. The patient tolerated the procedure well without immediate postprocedural complication. IMPRESSION: Technically successful ultrasound guided biopsy of hypermetabolic right lateral cervical lymph node. Electronically Signed   By: Sandi Mariscal M.D.   On: 02/23/2020 14:59    ASSESSMENT & PLAN:   86 y.o. male with  1. Diffuse Large B-Cell Lymphoma, Stage IV Presenting without constitutional symptoms. Palpable right cervical and supraclavicular lymphadenopathy.   07/08/18 Right cervical soft tissue biopsy revealed Diffuse Large B-Cell Lymphoma, germinal center type   06/05/18 CT Neck revealed Pathologic RIGHT-sided level II, III, IV, and V lymphadenopathy, most consistent with metastatic carcinoma. An obvious primary source is not identified. Tissue sampling is warranted.  07/16/18 Hep B and Hep C negative  07/17/18 ECHO revealed LV EF of 60-65%  07/22/18 PET/CT revealed "Hypermetabolic adenopathy especially concentrated in the right neck but also in the left neck, chest, abdomen, and pelvis. The adenopathy is primarily Deauville 5 although some few scattered smaller lesions are Deauville 4. 2. Diffuse abnormal splenic activity, Deauville 5, without overt splenomegaly. 3. There is also hypermetabolic Deauville 5 activity in the mildly thickened distal appendix, and raising suspicion for involvement of the appendix. 4. Other imaging findings of potential clinical significance: Aortic Atherosclerosis. Coronary atherosclerosis."  10/03/2018 PET scan revealed "Interval response to therapy with stable to decreased size of lymph nodes on CT and generalized decrease in hypermetabolism of the abnormal nodes. Hypermetabolism in the lymph nodes today is compatible with a combination of Deauville 3 and Deauville 4 disease. Stable  tiny bilateral pulmonary nodules. Increase radiotracer accumulation in the marrow space on today's study, presumably representing marrow stimulatory effects of therapy."   PLAN: -Discussed pt labwork today, 03/11/20; PLT holding steady, other blood counts & chemistries are nml, LDH is WNL.  -Discussed 02/23/2020 Right Cervical Lymph Node Surgical Pathology [phone]) which revealed "Non-Hodgkin B-cell lymphoma. Ki-67 generally shows low expression (<5-15%)." -Discussed 02/23/2020 FISH Mutation Analysis which revealed "t(11;14): DETECTED". -Advised pt that this appears to be a slow-growing Mantle Cell Lymphoma, not a  recurrence of DLBCL. -Advised pt that there can be some plasticity in lymphomas that can drive them to change charachteristics.  -Advised pt that we would watch and not rush to treatment if Mantle Cell Lymphoma is considered indolent. -Advised pt that although his Ki-67 is low we are seeing increasing activity in the lymph nodes, so we can not classify this as indolent.  -Recommend we begin Benamustine/Rituxan for up to 6 cycles, then continue Rituxan until progression or intolerance (or for 3 years). -Advised pt that we could proceed with immunotherapy only in a palliative setting, but remission is less likely in this case.  -Pt will discuss his options with his family & contact with his decision. -- Patient contacted our clinic and would like to proceed with BR chemotherapy with Rituxan maintenance. -will need G-CSF support due to extensive previous chemotherapy and age --with limited bone marrow reserve.   FOLLOW UP: Chemotherapy counseling for Bendamustine/Rituxan in 2-3 days Pl schedule to start Bendamustine/Rituxan chemotherapy in about 7-8 days (D1 and D2 and D3) with portflush and labs MD visit in 3 weeks with portflush and labs for toxicity check  (12/21 -high priority scheduling message sent. Ordering for treatment placed)   The total time spent in the appt was 40  minutes and more than 50% was on counseling and direct patient cares.  All of the patient's questions were answered with apparent satisfaction. The patient knows to call the clinic with any problems, questions or concerns.   Sullivan Lone MD [place] AAHIVMS [facility] Hematology/Oncology Physician [facility]  (Office):       [phone] (Work cell):  [phone] (Fax):           [phone]  03/11/2020 11:48 AM  I, Yevette Edwards, am acting as a scribe for Dr. Sullivan Lone.   .I have reviewed the above documentation for accuracy and completeness, and I agree with the above. Brunetta Genera MD

## 2020-03-12 ENCOUNTER — Telehealth: Payer: Self-pay | Admitting: *Deleted

## 2020-03-12 NOTE — Telephone Encounter (Signed)
Pt states he would like to go ahead with treatments. Dr Irene Limbo notified

## 2020-03-16 ENCOUNTER — Other Ambulatory Visit: Payer: Self-pay

## 2020-03-16 ENCOUNTER — Telehealth (INDEPENDENT_AMBULATORY_CARE_PROVIDER_SITE_OTHER): Payer: Medicare Other | Admitting: Family Medicine

## 2020-03-16 ENCOUNTER — Encounter: Payer: Self-pay | Admitting: Family Medicine

## 2020-03-16 VITALS — BP 115/74 | Wt 168.0 lb

## 2020-03-16 DIAGNOSIS — C8331 Diffuse large B-cell lymphoma, lymph nodes of head, face, and neck: Secondary | ICD-10-CM

## 2020-03-16 DIAGNOSIS — I700 Atherosclerosis of aorta: Secondary | ICD-10-CM

## 2020-03-16 DIAGNOSIS — Z7189 Other specified counseling: Secondary | ICD-10-CM | POA: Insufficient documentation

## 2020-03-16 DIAGNOSIS — I10 Essential (primary) hypertension: Secondary | ICD-10-CM | POA: Diagnosis not present

## 2020-03-16 DIAGNOSIS — Z95828 Presence of other vascular implants and grafts: Secondary | ICD-10-CM | POA: Diagnosis not present

## 2020-03-16 DIAGNOSIS — C8310 Mantle cell lymphoma, unspecified site: Secondary | ICD-10-CM | POA: Insufficient documentation

## 2020-03-16 DIAGNOSIS — E785 Hyperlipidemia, unspecified: Secondary | ICD-10-CM

## 2020-03-16 MED ORDER — PROCHLORPERAZINE MALEATE 10 MG PO TABS: 10.0000 mg | ORAL_TABLET | Freq: Four times a day (QID) | ORAL | 1 refills | Status: DC | PRN

## 2020-03-16 MED ORDER — DEXAMETHASONE 4 MG PO TABS: 8.0000 mg | ORAL_TABLET | Freq: Every day | ORAL | 1 refills | Status: DC

## 2020-03-16 MED ORDER — LIDOCAINE-PRILOCAINE 2.5-2.5 % EX CREA: TOPICAL_CREAM | CUTANEOUS | 3 refills | Status: DC

## 2020-03-16 MED ORDER — ONDANSETRON HCL 8 MG PO TABS: 8.0000 mg | ORAL_TABLET | Freq: Two times a day (BID) | ORAL | 1 refills | Status: DC | PRN

## 2020-03-16 MED ORDER — LORAZEPAM 0.5 MG PO TABS: 0.5000 mg | ORAL_TABLET | Freq: Four times a day (QID) | ORAL | 0 refills | Status: DC | PRN

## 2020-03-16 NOTE — Progress Notes (Signed)
° °  Subjective:    Patient ID: Mario Garcia, male    DOB: 12/02/1931, 86 y.o.   MRN: [id number]  HPI  I connected with  Mario Garcia on 03/16/20 by elemedicine application and verified that I am speaking with the correct person using two identifiers.  Could not connect with caregility.  I discussed the limitations of evaluation and management by telemedicine. The patient expressed understanding and agreed to proceed.  He is here for an interval evaluation.  He is followed by oncology and apparently a different diagnosis was entertained being mantle cell lymphoma.  Review of the record is incomplete so cannot verify this.  He continues on Pravachol and having no difficulty with that.  He is also taking losartan/HCTZ and states his blood pressure today is 115/74.  He still does have the Port-A-Cath in place with apparently no desire for the oncologist to remove it.  Review of the record does indicate that he does have atherosclerosis of the aorta.   Review of Systems     Objective:   Physical Exam Alert and in no distress.  Blood pressure is recorded.       Assessment & Plan:  Diffuse large B-cell lymphoma of lymph nodes of neck (HCC)  Port-A-Cath in place  Essential hypertension  Hyperlipidemia with target LDL less than 100  Atherosclerosis of aorta (HCC) Lipid panel will be ordered in 1 month.  Continue on present medication regimen. 25 minutes spent in history, coordination of care and documentation.

## 2020-03-16 NOTE — Progress Notes (Addendum)
ALERT: A disease instance has been permanently removed from this patient's pathway record and replaced with a new disease instance. Information on the new disease instance will be transmitted in a separate message.  Disease Being Removed: Lymphoma and CLL  Reason for Removal: Previous diagnosis has morphed into a different diagnosis 

## 2020-03-16 NOTE — Progress Notes (Signed)
START ON PATHWAY REGIMEN - Lymphoma and CLL     A cycle is every 28 days:     Rituximab-xxxx      Bendamustine   **Always confirm dose/schedule in your pharmacy ordering system**  Patient Characteristics: Mantle Cell Lymphoma, First Line, Aggressive Disease or Treatment Indicated, Stage II - IV, Not a Transplant Candidate Disease Type: Not Applicable Disease Type: Mantle Cell Lymphoma Disease Type: Not Applicable Line of Therapy: First Line Patient Characteristics: Not a Transplant Candidate Intent of Therapy: Non-Curative / Palliative Intent, Discussed with Patient 

## 2020-03-18 ENCOUNTER — Telehealth: Payer: Self-pay | Admitting: Hematology

## 2020-03-18 NOTE — Telephone Encounter (Signed)
scheduled appt per 12/21 sch msg - - pt is aware of appt date and time.

## 2020-03-22 ENCOUNTER — Other Ambulatory Visit: Payer: Self-pay | Admitting: *Deleted

## 2020-03-22 DIAGNOSIS — C8318 Mantle cell lymphoma, lymph nodes of multiple sites: Secondary | ICD-10-CM

## 2020-03-22 LAB — SURGICAL PATHOLOGY

## 2020-03-23 ENCOUNTER — Inpatient Hospital Stay: Payer: Medicare Other

## 2020-03-23 ENCOUNTER — Other Ambulatory Visit: Payer: Self-pay

## 2020-03-23 DIAGNOSIS — Z87891 Personal history of nicotine dependence: Secondary | ICD-10-CM | POA: Diagnosis not present

## 2020-03-23 DIAGNOSIS — C8318 Mantle cell lymphoma, lymph nodes of multiple sites: Secondary | ICD-10-CM

## 2020-03-23 DIAGNOSIS — Z79899 Other long term (current) drug therapy: Secondary | ICD-10-CM | POA: Diagnosis not present

## 2020-03-23 DIAGNOSIS — Z5111 Encounter for antineoplastic chemotherapy: Secondary | ICD-10-CM | POA: Diagnosis not present

## 2020-03-23 DIAGNOSIS — Z8546 Personal history of malignant neoplasm of prostate: Secondary | ICD-10-CM | POA: Diagnosis not present

## 2020-03-23 DIAGNOSIS — Z5112 Encounter for antineoplastic immunotherapy: Secondary | ICD-10-CM | POA: Diagnosis not present

## 2020-03-23 DIAGNOSIS — C8330 Diffuse large B-cell lymphoma, unspecified site: Secondary | ICD-10-CM | POA: Diagnosis not present

## 2020-03-23 LAB — CBC WITH DIFFERENTIAL (CANCER CENTER ONLY)
Abs Immature Granulocytes: 0.03 10*3/uL (ref 0.00–0.07)
Basophils Absolute: 0 10*3/uL (ref 0.0–0.1)
Basophils Relative: 0 %
Eosinophils Absolute: 0.1 10*3/uL (ref 0.0–0.5)
Eosinophils Relative: 1 %
HCT: 40.5 % (ref 39.0–52.0)
Hemoglobin: 13.1 g/dL (ref 13.0–17.0)
Immature Granulocytes: 0 %
Lymphocytes Relative: 40 %
Lymphs Abs: 3.2 10*3/uL (ref 0.7–4.0)
MCH: 29.2 pg (ref 26.0–34.0)
MCHC: 32.3 g/dL (ref 30.0–36.0)
MCV: 90.4 fL (ref 80.0–100.0)
Monocytes Absolute: 1.2 10*3/uL — ABNORMAL HIGH (ref 0.1–1.0)
Monocytes Relative: 15 %
Neutro Abs: 3.4 10*3/uL (ref 1.7–7.7)
Neutrophils Relative %: 44 %
Platelet Count: 113 10*3/uL — ABNORMAL LOW (ref 150–400)
RBC: 4.48 MIL/uL (ref 4.22–5.81)
RDW: 12.6 % (ref 11.5–15.5)
WBC Count: 7.9 10*3/uL (ref 4.0–10.5)
nRBC: 0 % (ref 0.0–0.2)

## 2020-03-23 LAB — CMP (CANCER CENTER ONLY)
ALT: 16 U/L (ref 0–44)
AST: 22 U/L (ref 15–41)
Albumin: 3.9 g/dL (ref 3.5–5.0)
Alkaline Phosphatase: 79 U/L (ref 38–126)
Anion gap: 9 (ref 5–15)
BUN: 19 mg/dL (ref 8–23)
CO2: 29 mmol/L (ref 22–32)
Calcium: 9.2 mg/dL (ref 8.9–10.3)
Chloride: 102 mmol/L (ref 98–111)
Creatinine: 0.93 mg/dL (ref 0.61–1.24)
GFR, Estimated: >60 mL/min (ref >60)
Glucose, Bld: 120 mg/dL — ABNORMAL HIGH (ref 70–99)
Potassium: 3.9 mmol/L (ref 3.5–5.1)
Sodium: 140 mmol/L (ref 135–145)
Total Bilirubin: 0.4 mg/dL (ref 0.3–1.2)
Total Protein: 7.1 g/dL (ref 6.5–8.1)

## 2020-03-23 LAB — LACTATE DEHYDROGENASE: LDH: 151 U/L (ref 98–192)

## 2020-03-24 NOTE — Progress Notes (Signed)
Pharmacist Chemotherapy Monitoring - Initial Assessment    Anticipated start date: 03/25/20  Regimen:   Are orders appropriate based on the patients diagnosis, regimen, and cycle? Yes  Does the plan date match the patients scheduled date? Yes  Is the sequencing of drugs appropriate? Yes  Are the premedications appropriate for the patients regimen? Yes  Prior Authorization for treatment is: Approved o If applicable, is the correct biosimilar selected based on the patient's insurance? yes  Organ Function and Labs:  Are dose adjustments needed based on the patient's renal function, hepatic function, or hematologic function? No  Are appropriate labs ordered prior to the start of patient's treatment? Yes  Other organ system assessment, if indicated: N/A  The following baseline labs, if indicated, have been ordered: rituximab: baseline Hepatitis B labs - drawn 07/16/18; all negative  Dose Assessment:  Are the drug doses appropriate? Yes  Are the following correct: o Drug concentrations Yes o IV fluid compatible with drug Yes o Administration routes Yes o Timing of therapy Yes  If applicable, does the patient have documented access for treatment and/or plans for port-a-cath placement? yes  If applicable, have lifetime cumulative doses been properly documented and assessed? yes Lifetime Dose Tracking   Doxorubicin: 189.885 mg/m2 (360 mg) = 42.2 % of the maximum lifetime dose of 450 mg/m2  o   Toxicity Monitoring/Prevention:  The patient has the following take home antiemetics prescribed: Ondansetron, Prochlorperazine, Dexamethasone and Lorazepam  The patient has the following take home medications prescribed: N/A  Medication allergies and previous infusion related reactions, if applicable, have been reviewed and addressed. Yes  The patient's current medication list has been assessed for drug-drug interactions with their chemotherapy regimen. no significant drug-drug  interactions were identified on review.  Order Review:  Are the treatment plan orders signed? Yes  Is the patient scheduled to see a provider prior to their treatment? No  I verify that I have reviewed each item in the above checklist and answered each question accordingly.   Ebony Hail, Pharm.D., CPP 03/24/2020@11 :58 AM

## 2020-03-25 ENCOUNTER — Other Ambulatory Visit: Payer: Self-pay

## 2020-03-25 ENCOUNTER — Inpatient Hospital Stay: Payer: Medicare Other

## 2020-03-25 VITALS — BP 118/53 | HR 76 | Temp 96.1°F | Resp 17

## 2020-03-25 DIAGNOSIS — Z87891 Personal history of nicotine dependence: Secondary | ICD-10-CM | POA: Diagnosis not present

## 2020-03-25 DIAGNOSIS — C8318 Mantle cell lymphoma, lymph nodes of multiple sites: Secondary | ICD-10-CM

## 2020-03-25 DIAGNOSIS — Z5111 Encounter for antineoplastic chemotherapy: Secondary | ICD-10-CM | POA: Diagnosis not present

## 2020-03-25 DIAGNOSIS — Z8546 Personal history of malignant neoplasm of prostate: Secondary | ICD-10-CM | POA: Diagnosis not present

## 2020-03-25 DIAGNOSIS — Z5112 Encounter for antineoplastic immunotherapy: Secondary | ICD-10-CM | POA: Diagnosis not present

## 2020-03-25 DIAGNOSIS — C8330 Diffuse large B-cell lymphoma, unspecified site: Secondary | ICD-10-CM | POA: Diagnosis not present

## 2020-03-25 DIAGNOSIS — Z79899 Other long term (current) drug therapy: Secondary | ICD-10-CM | POA: Diagnosis not present

## 2020-03-25 DIAGNOSIS — Z7189 Other specified counseling: Secondary | ICD-10-CM

## 2020-03-25 LAB — CMP (CANCER CENTER ONLY)
ALT: 17 U/L (ref 0–44)
AST: 20 U/L (ref 15–41)
Albumin: 3.7 g/dL (ref 3.5–5.0)
Alkaline Phosphatase: 80 U/L (ref 38–126)
Anion gap: 7 (ref 5–15)
BUN: 16 mg/dL (ref 8–23)
CO2: 29 mmol/L (ref 22–32)
Calcium: 9 mg/dL (ref 8.9–10.3)
Chloride: 103 mmol/L (ref 98–111)
Creatinine: 0.82 mg/dL (ref 0.61–1.24)
GFR, Estimated: >60 mL/min (ref >60)
Glucose, Bld: 155 mg/dL — ABNORMAL HIGH (ref 70–99)
Potassium: 3.6 mmol/L (ref 3.5–5.1)
Sodium: 139 mmol/L (ref 135–145)
Total Bilirubin: 0.5 mg/dL (ref 0.3–1.2)
Total Protein: 6.9 g/dL (ref 6.5–8.1)

## 2020-03-25 LAB — CBC WITH DIFFERENTIAL (CANCER CENTER ONLY)
Abs Immature Granulocytes: 0.02 10*3/uL (ref 0.00–0.07)
Basophils Absolute: 0 10*3/uL (ref 0.0–0.1)
Basophils Relative: 0 %
Eosinophils Absolute: 0 10*3/uL (ref 0.0–0.5)
Eosinophils Relative: 1 %
HCT: 39 % (ref 39.0–52.0)
Hemoglobin: 12.7 g/dL — ABNORMAL LOW (ref 13.0–17.0)
Immature Granulocytes: 0 %
Lymphocytes Relative: 34 %
Lymphs Abs: 2.4 10*3/uL (ref 0.7–4.0)
MCH: 29.5 pg (ref 26.0–34.0)
MCHC: 32.6 g/dL (ref 30.0–36.0)
MCV: 90.5 fL (ref 80.0–100.0)
Monocytes Absolute: 1 10*3/uL (ref 0.1–1.0)
Monocytes Relative: 14 %
Neutro Abs: 3.6 10*3/uL (ref 1.7–7.7)
Neutrophils Relative %: 51 %
Platelet Count: 107 10*3/uL — ABNORMAL LOW (ref 150–400)
RBC: 4.31 MIL/uL (ref 4.22–5.81)
RDW: 12.4 % (ref 11.5–15.5)
WBC Count: 7.1 10*3/uL (ref 4.0–10.5)
nRBC: 0 % (ref 0.0–0.2)

## 2020-03-25 LAB — HEPATITIS B CORE ANTIBODY, TOTAL: Hep B Core Total Ab: NONREACTIVE

## 2020-03-25 LAB — HEPATITIS B SURFACE ANTIGEN: Hepatitis B Surface Ag: NONREACTIVE

## 2020-03-25 MED ORDER — ACETAMINOPHEN 325 MG PO TABS
650.0000 mg | ORAL_TABLET | Freq: Once | ORAL | Status: AC
  Administered 2020-03-25: 08:00:00 650 mg via ORAL

## 2020-03-25 MED ORDER — SODIUM CHLORIDE 0.9% FLUSH
10.0000 mL | INTRAVENOUS | Status: DC | PRN
  Administered 2020-03-25: 13:00:00 10 mL
  Filled 2020-03-25: qty 10

## 2020-03-25 MED ORDER — SODIUM CHLORIDE 0.9 % IV SOLN
375.0000 mg/m2 | Freq: Once | INTRAVENOUS | Status: AC
  Administered 2020-03-25: 09:00:00 700 mg via INTRAVENOUS
  Filled 2020-03-25: qty 50

## 2020-03-25 MED ORDER — PALONOSETRON HCL INJECTION 0.25 MG/5ML
0.2500 mg | Freq: Once | INTRAVENOUS | Status: AC
  Administered 2020-03-25: 08:00:00 0.25 mg via INTRAVENOUS

## 2020-03-25 MED ORDER — DIPHENHYDRAMINE HCL 25 MG PO CAPS
50.0000 mg | ORAL_CAPSULE | Freq: Once | ORAL | Status: AC
  Administered 2020-03-25: 08:00:00 50 mg via ORAL

## 2020-03-25 MED ORDER — HEPARIN SOD (PORK) LOCK FLUSH 100 UNIT/ML IV SOLN
500.0000 [IU] | Freq: Once | INTRAVENOUS | Status: AC | PRN
  Administered 2020-03-25: 13:00:00 500 [IU]
  Filled 2020-03-25: qty 5

## 2020-03-25 MED ORDER — PALONOSETRON HCL INJECTION 0.25 MG/5ML
INTRAVENOUS | Status: AC
  Filled 2020-03-25: qty 5

## 2020-03-25 MED ORDER — SODIUM CHLORIDE 0.9 % IV SOLN
Freq: Once | INTRAVENOUS | Status: AC
  Filled 2020-03-25: qty 250

## 2020-03-25 MED ORDER — ACETAMINOPHEN 325 MG PO TABS
ORAL_TABLET | ORAL | Status: AC
  Filled 2020-03-25: qty 2

## 2020-03-25 MED ORDER — DIPHENHYDRAMINE HCL 25 MG PO CAPS
ORAL_CAPSULE | ORAL | Status: AC
  Filled 2020-03-25: qty 2

## 2020-03-25 MED ORDER — SODIUM CHLORIDE 0.9 % IV SOLN
70.0000 mg/m2 | Freq: Once | INTRAVENOUS | Status: AC
  Administered 2020-03-25: 13:00:00 125 mg via INTRAVENOUS
  Filled 2020-03-25: qty 5

## 2020-03-25 MED ORDER — SODIUM CHLORIDE 0.9 % IV SOLN
10.0000 mg | Freq: Once | INTRAVENOUS | Status: AC
  Administered 2020-03-25: 08:00:00 10 mg via INTRAVENOUS
  Filled 2020-03-25: qty 1
  Filled 2020-03-25: qty 10

## 2020-03-25 NOTE — Patient Instructions (Signed)
[facility] Discharge Instructions for Patients Receiving Chemotherapy  Today you received the following chemotherapy agents: Rituximab, Bend[place]a  To help prevent nausea and vomiting after your treatment, we encourage you to take your nausea medication as directed.   If you develop nausea and vomiting that is not controlled by your nausea medication, call the clinic.   BELOW ARE SYMPTOMS THAT SHOULD BE REPORTED IMMEDIATELY:  *FEVER GREATER THAN 100.5 F  *CHILLS WITH OR WITHOUT FEVER  NAUSEA AND VOMITING THAT IS NOT CONTROLLED WITH YOUR NAUSEA MEDICATION  *UNUSUAL SHORTNESS OF BREATH  *UNUSUAL BRUISING OR BLEEDING  TENDERNESS IN MOUTH AND THROAT WITH OR WITHOUT PRESENCE OF ULCERS  *URINARY PROBLEMS  *BOWEL PROBLEMS  UNUSUAL RASH Items with * indicate a potential emergency and should be followed up as soon as possible.  Feel free to call the clinic should you have any questions or concerns. The clinic phone number is [phone].  Please show the [facility] at check-in to the Emergency Department and triage nurse.  Rituximab injection What is this medicine? RITUXIMAB (ri TUX i mab) is a monoclonal antibody. It is used to treat certain types of cancer like non-Hodgkin lymphoma and chronic lymphocytic leukemia. It is also used to treat rheumatoid arthritis, granulomatosis with polyangiitis (or Wegener's granulomatosis), microscopic polyangiitis, and pemphigus vulgaris. This medicine may be used for other purposes; ask your health care provider or pharmacist if you have questions. COMMON BRAND NAME(S): Rituxan, RUXIENCE What should I tell my health care provider before I take this medicine? They need to know if you have any of these conditions:  heart disease  infection (especially a virus infection such as hepatitis B, chickenpox, cold sores, or herpes)  immune system problems  irregular heartbeat  kidney disease  low blood counts, like low white  cell, platelet, or red cell counts  lung or breathing disease, like asthma  recently received or scheduled to receive a vaccine  an unusual or allergic reaction to rituximab, other medicines, foods, dyes, or preservatives  pregnant or trying to get pregnant  breast-feeding How should I use this medicine? This medicine is for infusion into a vein. It is administered in a hospital or clinic by a specially trained health care professional. A special MedGuide will be given to you by the pharmacist with each prescription and refill. Be sure to read this information carefully each time. Talk to your pediatrician regarding the use of this medicine in children. This medicine is not approved for use in children. Overdosage: If you think you have taken too much of this medicine contact a poison control center or emergency room at once. NOTE: This medicine is only for you. Do not share this medicine with others. What if I miss a dose? It is important not to miss a dose. Call your doctor or health care professional if you are unable to keep an appointment. What may interact with this medicine?  cisplatin  live virus vaccines This list may not describe all possible interactions. Give your health care provider a list of all the medicines, herbs, non-prescription drugs, or dietary supplements you use. Also tell them if you smoke, drink alcohol, or use illegal drugs. Some items may interact with your medicine. What should I watch for while using this medicine? Your condition will be monitored carefully while you are receiving this medicine. You may need blood work done while you are taking this medicine. This medicine can cause serious allergic reactions. To reduce your risk you  may need to take medicine before treatment with this medicine. Take your medicine as directed. In some patients, this medicine may cause a serious brain infection that may cause death. If you have any problems seeing, thinking,  speaking, walking, or standing, tell your healthcare professional right away. If you cannot reach your healthcare professional, urgently seek other source of medical care. Call your doctor or health care professional for advice if you get a fever, chills or sore throat, or other symptoms of a cold or flu. Do not treat yourself. This drug decreases your body's ability to fight infections. Try to avoid being around people who are sick. Do not become pregnant while taking this medicine or for at least 12 months after stopping it. Women should inform their doctor if they wish to become pregnant or think they might be pregnant. There is a potential for serious side effects to an unborn child. Talk to your health care professional or pharmacist for more information. Do not breast-feed an infant while taking this medicine or for at least 6 months after stopping it. What side effects may I notice from receiving this medicine? Side effects that you should report to your doctor or health care professional as soon as possible:  allergic reactions like skin rash, itching or hives; swelling of the face, lips, or tongue  breathing problems  chest pain  changes in vision  diarrhea  headache with fever, neck stiffness, sensitivity to light, nausea, or confusion  fast, irregular heartbeat  loss of memory  low blood counts - this medicine may decrease the number of white blood cells, red blood cells and platelets. You may be at increased risk for infections and bleeding.  mouth sores  problems with balance, talking, or walking  redness, blistering, peeling or loosening of the skin, including inside the mouth  signs of infection - fever or chills, cough, sore throat, pain or difficulty passing urine  signs and symptoms of kidney injury like trouble passing urine or change in the amount of urine  signs and symptoms of liver injury like dark yellow or brown urine; general ill feeling or flu-like  symptoms; light-colored stools; loss of appetite; nausea; right upper belly pain; unusually weak or tired; yellowing of the eyes or skin  signs and symptoms of low blood pressure like dizziness; feeling faint or lightheaded, falls; unusually weak or tired  stomach pain  swelling of the ankles, feet, hands  unusual bleeding or bruising  vomiting Side effects that usually do not require medical attention (report to your doctor or health care professional if they continue or are bothersome):  headache  joint pain  muscle cramps or muscle pain  nausea  tiredness This list may not describe all possible side effects. Call your doctor for medical advice about side effects. You may report side effects to FDA at 1-800-FDA-1088. Where should I keep my medicine? This drug is given in a hospital or clinic and will not be stored at home. NOTE: This sheet is a summary. It may not cover all possible information. If you have questions about this medicine, talk to your doctor, pharmacist, or health care provider.  2020 Elsevier/Gold Standard (2018-04-24 22:01:36)  Bendamustine Injection What is this medicine? BENDAMUSTINE (BEN da MUS teen) is a chemotherapy drug. It is used to treat chronic lymphocytic leukemia and non-Hodgkin lymphoma. This medicine may be used for other purposes; ask your health care provider or pharmacist if you have questions. COMMON BRAND NAME(S): BELRAPZO, BENDEKA, Treanda What should I  tell my health care provider before I take this medicine? They need to know if you have any of these conditions:  infection (especially a virus infection such as chickenpox, cold sores, or herpes)  kidney disease  liver disease  an unusual or allergic reaction to bendamustine, mannitol, other medicines, foods, dyes, or preservatives  pregnant or trying to get pregnant  breast-feeding How should I use this medicine? This medicine is for infusion into a vein. It is given by a  health care professional in a hospital or clinic setting. Talk to your pediatrician regarding the use of this medicine in children. Special care may be needed. Overdosage: If you think you have taken too much of this medicine contact a poison control center or emergency room at once. NOTE: This medicine is only for you. Do not share this medicine with others. What if I miss a dose? It is important not to miss your dose. Call your doctor or health care professional if you are unable to keep an appointment. What may interact with this medicine? Do not take this medicine with any of the following medications:  clozapine This medicine may also interact with the following medications:  atazanavir  cimetidine  ciprofloxacin  enoxacin  fluvoxamine  medicines for seizures like carbamazepine and phenobarbital  mexiletine  rifampin  tacrine  thiabendazole  zileuton This list may not describe all possible interactions. Give your health care provider a list of all the medicines, herbs, non-prescription drugs, or dietary supplements you use. Also tell them if you smoke, drink alcohol, or use illegal drugs. Some items may interact with your medicine. What should I watch for while using this medicine? This drug may make you feel generally unwell. This is not uncommon, as chemotherapy can affect healthy cells as well as cancer cells. Report any side effects. Continue your course of treatment even though you feel ill unless your doctor tells you to stop. You may need blood work done while you are taking this medicine. Call your doctor or healthcare provider for advice if you get a fever, chills or sore throat, or other symptoms of a cold or flu. Do not treat yourself. This drug decreases your body's ability to fight infections. Try to avoid being around people who are sick. This medicine may cause serious skin reactions. They can happen weeks to months after starting the medicine. Contact your  healthcare provider right away if you notice fevers or flu-like symptoms with a rash. The rash may be red or purple and then turn into blisters or peeling of the skin. Or, you might notice a red rash with swelling of the face, lips or lymph nodes in your neck or under your arms. This medicine may increase your risk to bruise or bleed. Call your doctor or healthcare provider if you notice any unusual bleeding. Talk to your doctor about your risk of cancer. You may be more at risk for certain types of cancers if you take this medicine. Do not become pregnant while taking this medicine or for at least 6 months after stopping it. Women should inform their doctor if they wish to become pregnant or think they might be pregnant. Men should not father a child while taking this medicine and for at least 3 months after stopping it. There is a potential for serious side effects to an unborn child. Talk to your healthcare provider or pharmacist for more information. Do not breast-feed an infant while taking this medicine or for at least 1 week  after stopping it. This medicine may make it more difficult to father a child. You should talk with your doctor or healthcare provider if you are concerned about your fertility. What side effects may I notice from receiving this medicine? Side effects that you should report to your doctor or health care professional as soon as possible:  allergic reactions like skin rash, itching or hives, swelling of the face, lips, or tongue  low blood counts - this medicine may decrease the number of white blood cells, red blood cells and platelets. You may be at increased risk for infections and bleeding.  rash, fever, and swollen lymph nodes  redness, blistering, peeling, or loosening of the skin, including inside the mouth  signs of infection like fever or chills, cough, sore throat, pain or difficulty passing urine  signs of decreased platelets or bleeding like bruising, pinpoint  red spots on the skin, black, tarry stools, blood in the urine  signs of decreased red blood cells like being unusually weak or tired, fainting spells, lightheadedness  signs and symptoms of kidney injury like trouble passing urine or change in the amount of urine  signs and symptoms of liver injury like dark yellow or brown urine; general ill feeling or flu-like symptoms; light-colored stools; loss of appetite; nausea; right upper belly pain; unusually weak or tired; yellowing of the eyes or skin Side effects that usually do not require medical attention (report to your doctor or health care professional if they continue or are bothersome):  constipation  decreased appetite  diarrhea  headache  mouth sores  nausea, vomiting  tiredness This list may not describe all possible side effects. Call your doctor for medical advice about side effects. You may report side effects to FDA at 1-800-FDA-1088. Where should I keep my medicine? This drug is given in a hospital or clinic and will not be stored at home. NOTE: This sheet is a summary. It may not cover all possible information. If you have questions about this medicine, talk to your doctor, pharmacist, or health care provider.  2020 Elsevier/Gold Standard (2018-06-04 10:26:46)

## 2020-03-26 ENCOUNTER — Inpatient Hospital Stay: Payer: Medicare Other

## 2020-03-26 ENCOUNTER — Other Ambulatory Visit: Payer: Self-pay

## 2020-03-26 VITALS — BP 140/54 | HR 65 | Temp 97.0°F | Resp 18

## 2020-03-26 DIAGNOSIS — Z79899 Other long term (current) drug therapy: Secondary | ICD-10-CM | POA: Diagnosis not present

## 2020-03-26 DIAGNOSIS — Z7189 Other specified counseling: Secondary | ICD-10-CM

## 2020-03-26 DIAGNOSIS — C8318 Mantle cell lymphoma, lymph nodes of multiple sites: Secondary | ICD-10-CM

## 2020-03-26 DIAGNOSIS — Z5111 Encounter for antineoplastic chemotherapy: Secondary | ICD-10-CM | POA: Diagnosis not present

## 2020-03-26 DIAGNOSIS — Z8546 Personal history of malignant neoplasm of prostate: Secondary | ICD-10-CM | POA: Diagnosis not present

## 2020-03-26 DIAGNOSIS — C8330 Diffuse large B-cell lymphoma, unspecified site: Secondary | ICD-10-CM | POA: Diagnosis not present

## 2020-03-26 DIAGNOSIS — Z5112 Encounter for antineoplastic immunotherapy: Secondary | ICD-10-CM | POA: Diagnosis not present

## 2020-03-26 DIAGNOSIS — Z87891 Personal history of nicotine dependence: Secondary | ICD-10-CM | POA: Diagnosis not present

## 2020-03-26 MED ORDER — SODIUM CHLORIDE 0.9 % IV SOLN
Freq: Once | INTRAVENOUS | Status: AC
  Filled 2020-03-26: qty 250

## 2020-03-26 MED ORDER — HEPARIN SOD (PORK) LOCK FLUSH 100 UNIT/ML IV SOLN
250.0000 [IU] | Freq: Once | INTRAVENOUS | Status: AC | PRN
  Administered 2020-03-26: 250 [IU]
  Filled 2020-03-26: qty 5

## 2020-03-26 MED ORDER — SODIUM CHLORIDE 0.9 % IV SOLN
10.0000 mg | Freq: Once | INTRAVENOUS | Status: AC
  Administered 2020-03-26: 10 mg via INTRAVENOUS
  Filled 2020-03-26: qty 10

## 2020-03-26 MED ORDER — SODIUM CHLORIDE 0.9 % IV SOLN
70.0000 mg/m2 | Freq: Once | INTRAVENOUS | Status: AC
  Administered 2020-03-26: 125 mg via INTRAVENOUS
  Filled 2020-03-26: qty 5

## 2020-03-26 MED ORDER — SODIUM CHLORIDE 0.9% FLUSH
10.0000 mL | INTRAVENOUS | Status: DC | PRN
  Administered 2020-03-26 (×2): 10 mL
  Filled 2020-03-26: qty 10

## 2020-03-27 ENCOUNTER — Inpatient Hospital Stay: Payer: Medicare Other | Attending: Hematology

## 2020-03-27 VITALS — BP 144/69 | HR 88 | Temp 98.5°F | Resp 18

## 2020-03-27 DIAGNOSIS — I10 Essential (primary) hypertension: Secondary | ICD-10-CM | POA: Diagnosis not present

## 2020-03-27 DIAGNOSIS — Z5112 Encounter for antineoplastic immunotherapy: Secondary | ICD-10-CM | POA: Diagnosis not present

## 2020-03-27 DIAGNOSIS — C8318 Mantle cell lymphoma, lymph nodes of multiple sites: Secondary | ICD-10-CM

## 2020-03-27 DIAGNOSIS — Z79899 Other long term (current) drug therapy: Secondary | ICD-10-CM | POA: Insufficient documentation

## 2020-03-27 DIAGNOSIS — Z5189 Encounter for other specified aftercare: Secondary | ICD-10-CM | POA: Insufficient documentation

## 2020-03-27 DIAGNOSIS — Z7189 Other specified counseling: Secondary | ICD-10-CM

## 2020-03-27 DIAGNOSIS — Z87891 Personal history of nicotine dependence: Secondary | ICD-10-CM | POA: Diagnosis not present

## 2020-03-27 DIAGNOSIS — C8330 Diffuse large B-cell lymphoma, unspecified site: Secondary | ICD-10-CM | POA: Diagnosis not present

## 2020-03-27 DIAGNOSIS — Z8546 Personal history of malignant neoplasm of prostate: Secondary | ICD-10-CM | POA: Diagnosis not present

## 2020-03-27 MED ORDER — PEGFILGRASTIM-CBQV 6 MG/0.6ML ~~LOC~~ SOSY
6.0000 mg | PREFILLED_SYRINGE | Freq: Once | SUBCUTANEOUS | Status: AC
  Administered 2020-03-27: 6 mg via SUBCUTANEOUS

## 2020-03-27 NOTE — Patient Instructions (Signed)

## 2020-03-29 ENCOUNTER — Telehealth: Payer: Self-pay | Admitting: *Deleted

## 2020-04-07 ENCOUNTER — Inpatient Hospital Stay (HOSPITAL_BASED_OUTPATIENT_CLINIC_OR_DEPARTMENT_OTHER): Payer: Medicare Other | Admitting: Hematology

## 2020-04-07 ENCOUNTER — Other Ambulatory Visit: Payer: Self-pay

## 2020-04-07 ENCOUNTER — Inpatient Hospital Stay: Payer: Medicare Other

## 2020-04-07 VITALS — BP 118/64 | HR 68 | Temp 98.0°F | Resp 15 | Ht 69.0 in | Wt 167.4 lb

## 2020-04-07 DIAGNOSIS — I10 Essential (primary) hypertension: Secondary | ICD-10-CM | POA: Diagnosis not present

## 2020-04-07 DIAGNOSIS — C8330 Diffuse large B-cell lymphoma, unspecified site: Secondary | ICD-10-CM | POA: Diagnosis not present

## 2020-04-07 DIAGNOSIS — C8318 Mantle cell lymphoma, lymph nodes of multiple sites: Secondary | ICD-10-CM

## 2020-04-07 DIAGNOSIS — Z5112 Encounter for antineoplastic immunotherapy: Secondary | ICD-10-CM | POA: Diagnosis not present

## 2020-04-07 DIAGNOSIS — Z5189 Encounter for other specified aftercare: Secondary | ICD-10-CM | POA: Diagnosis not present

## 2020-04-07 DIAGNOSIS — Z79899 Other long term (current) drug therapy: Secondary | ICD-10-CM | POA: Diagnosis not present

## 2020-04-07 DIAGNOSIS — Z95828 Presence of other vascular implants and grafts: Secondary | ICD-10-CM

## 2020-04-07 DIAGNOSIS — Z7189 Other specified counseling: Secondary | ICD-10-CM

## 2020-04-07 DIAGNOSIS — Z87891 Personal history of nicotine dependence: Secondary | ICD-10-CM | POA: Diagnosis not present

## 2020-04-07 LAB — CBC WITH DIFFERENTIAL (CANCER CENTER ONLY)
Abs Immature Granulocytes: 0.13 10*3/uL — ABNORMAL HIGH (ref 0.00–0.07)
Basophils Absolute: 0 10*3/uL (ref 0.0–0.1)
Basophils Relative: 0 %
Eosinophils Absolute: 0.1 10*3/uL (ref 0.0–0.5)
Eosinophils Relative: 0 %
HCT: 35.9 % — ABNORMAL LOW (ref 39.0–52.0)
Hemoglobin: 11.7 g/dL — ABNORMAL LOW (ref 13.0–17.0)
Immature Granulocytes: 1 %
Lymphocytes Relative: 10 %
Lymphs Abs: 1.3 10*3/uL (ref 0.7–4.0)
MCH: 29.6 pg (ref 26.0–34.0)
MCHC: 32.6 g/dL (ref 30.0–36.0)
MCV: 90.9 fL (ref 80.0–100.0)
Monocytes Absolute: 1.4 10*3/uL — ABNORMAL HIGH (ref 0.1–1.0)
Monocytes Relative: 11 %
Neutro Abs: 9.6 10*3/uL — ABNORMAL HIGH (ref 1.7–7.7)
Neutrophils Relative %: 78 %
Platelet Count: 92 10*3/uL — ABNORMAL LOW (ref 150–400)
RBC: 3.95 MIL/uL — ABNORMAL LOW (ref 4.22–5.81)
RDW: 13.2 % (ref 11.5–15.5)
WBC Count: 12.5 10*3/uL — ABNORMAL HIGH (ref 4.0–10.5)
nRBC: 0 % (ref 0.0–0.2)

## 2020-04-07 LAB — CMP (CANCER CENTER ONLY)
ALT: 16 U/L (ref 0–44)
AST: 17 U/L (ref 15–41)
Albumin: 3.6 g/dL (ref 3.5–5.0)
Alkaline Phosphatase: 120 U/L (ref 38–126)
Anion gap: 10 (ref 5–15)
BUN: 20 mg/dL (ref 8–23)
CO2: 30 mmol/L (ref 22–32)
Calcium: 8.6 mg/dL — ABNORMAL LOW (ref 8.9–10.3)
Chloride: 101 mmol/L (ref 98–111)
Creatinine: 1.1 mg/dL (ref 0.61–1.24)
GFR, Estimated: >60 mL/min (ref >60)
Glucose, Bld: 95 mg/dL (ref 70–99)
Potassium: 3.6 mmol/L (ref 3.5–5.1)
Sodium: 141 mmol/L (ref 135–145)
Total Bilirubin: 0.7 mg/dL (ref 0.3–1.2)
Total Protein: 6.7 g/dL (ref 6.5–8.1)

## 2020-04-07 MED ORDER — HEPARIN SOD (PORK) LOCK FLUSH 100 UNIT/ML IV SOLN
500.0000 [IU] | Freq: Once | INTRAVENOUS | Status: AC | PRN
  Administered 2020-04-07: 500 [IU]
  Filled 2020-04-07: qty 5

## 2020-04-07 MED ORDER — SODIUM CHLORIDE 0.9% FLUSH
10.0000 mL | INTRAVENOUS | Status: DC | PRN
  Administered 2020-04-07: 10 mL
  Filled 2020-04-07: qty 10

## 2020-04-07 NOTE — Patient Instructions (Signed)

## 2020-04-07 NOTE — Progress Notes (Signed)
HEMATOLOGY/ONCOLOGY CLINIC NOTE  Date of Service: 04/07/2020  Patient Care Team: Denita Lung, MD as PCP - General (Family Medicine)  CHIEF COMPLAINTS/PURPOSE OF CONSULTATION:  Diffuse Large B-Cell Lymphoma  HISTORY OF PRESENTING ILLNESS:   Mario Garcia is a wonderful 87 y.o. male who has been referred to [place] by Dr. Jill Alexanders for evaluation and management of Diffuse Large B-Cell Lymphoma. The pt reports that he is doing well overall.   The pt reports that he feels that he has been a little more tired recently. He first noticed some "nodules" on his neck appear "4-6 weeks ago." He notes that these nodules haven't been painful. He appears to have presented to care with his PCP on 05/28/18, Dr. Redmond School. He notes that he has noticed that he has been sweating more in the last year. He denies noticing any other new lumps or bumps. He denies any fevers, chills, drenching night sweats, or unexpected weight loss. He notes that he has had some changes in swallowing over the last 6 months, which he characterizes as "getting strangled on his saliva every now and then." He denies abdominal pains, acid reflux, changes in bowel habits, leg swelling, skin rashes. He endorses a deep pain "every once in a while," in his left groin. He denies headaches. He endorses glaucoma and a history of cataracts. He sees Dr. Hetty Blend in ophthalmology. He denies any other concern in the last 6 months.  The pt notes that he lives independently with his wife and still is able to do all he wants to do. He walks on trails with his wife and notes that he can generally walk as far as he likes to.  The pt notes that he had prostate cancer in 2008 or 2009, s/p prostatectomy. He did not require RT nor systemic treatments. He denies lung, heart or kidney problems.  Of note prior to the patient's visit today, pt has had a CT Neck completed on 06/05/18 with results revealing Pathologic RIGHT-sided level II, III, IV, and V  lymphadenopathy, most consistent with metastatic carcinoma. An obvious primary source is not identified. Tissue sampling is warranted.  Most recent lab results (07/08/18) of CBC and CMP is as follows: all values are WNL except for PLT at 129k.  On review of systems, pt reports slightly more tired, neck nodules, some changes in swallowing, staying active, and denies fevers, chills, drenching night sweats, unexpected weight loss, abdominal pains, changes in bowel habits, skin rashes, leg swelling, CP, SOB, difficulty breathing, headaches, other lumps or bumps, skin lesions, mouth sores, pain along the spine, back pain, leg swelling, and any other symptoms.   On PMHx the pt reports localized Prostate cancer in 2008 s/p prostatectomy.  On Social Hx the pt reports that he quit smoking over 50 years ago. He notes that he consumes about 1 glass of wine every day, without concerns for excessive drinking. 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 He denies concern for chemical or radiation exposure. On Family Hx the pt reports wife with Stage IV Non Hodgkin's Lymphoma, paternal grandmother with leukemia in her 66s, father with unspecified abdominal cancer.   Interval History:   Mario Garcia returns today for management and evaluation of his mantle cell lymphoma and a toxicity check after C1 of BR. The patient's last visit with [place] was on 03/11/2020. The pt reports that he is doing well overall.  The pt reports some grade 1 fatigue.  No nausea/vomting. No rashes  Lab results today (04/07/20) of CBC w/diff and CMP -reviewed with patient.  MEDICAL HISTORY:  Past Medical History:  Diagnosis Date  . Cancer ([place])    PROSTATE  . History of colonic polyps   . History of kidney stones   . Hypertension   . Inguinal hernia 03/2002  . Pneumonia    "years ago"    SURGICAL HISTORY: Past Surgical History:  Procedure Laterality Date  . COLONOSCOPY    . DIRECT LARYNGOSCOPY N/A  07/08/2018   Procedure: DIRECT LARYNGOSCOPY;  Surgeon: Leta Baptist, MD;  Location: [facility];  Service: ENT;  Laterality: N/A;  . ESOPHAGOSCOPY N/A 07/08/2018   Procedure: ESOPHAGOSCOPY;  Surgeon: Leta Baptist, MD;  Location: [facility];  Service: ENT;  Laterality: N/A;  . FLEXIBLE BRONCHOSCOPY N/A 07/08/2018   Procedure: FLEXIBLE BRONCHOSCOPY;  Surgeon: Leta Baptist, MD;  Location: [facility];  Service: ENT;  Laterality: N/A;  . HERNIA REPAIR Left    inguinal hernia  . IR IMAGING GUIDED PORT INSERTION  07/30/2018  . MASS BIOPSY Right 07/08/2018   Procedure: RIGHT NECK MASS EXCISIONAL BIOPSY;  Surgeon: Leta Baptist, MD;  Location: [facility];  Service: ENT;  Laterality: Right;  . PATELLA FRACTURE SURGERY Left   . PROSTATE SURGERY  10/2005   RADIAL PROSTATECTOMY  . ROTATOR CUFF REPAIR Bilateral     SOCIAL HISTORY: Social History   Socioeconomic History  . Marital status: Married    Spouse name: Not on file  . Number of children: Not on file  . Years of education: Not on file  . Highest education level: Not on file  Occupational History  . Not on file  Tobacco Use  . Smoking status: Former Research scientist (life sciences)  . Smokeless tobacco: Never Used  . Tobacco comment: stopped 60 years ago  Vaping Use  . Vaping Use: Never used  Substance and Sexual Activity  . Alcohol use: Yes    Alcohol/week: 6.0 standard drinks    Types: 6 Standard drinks or equivalent per week  . Drug use: No  . Sexual activity: Not Currently  Other Topics Concern  . Not on file  Social History Narrative  . Not on file   Social Determinants of Health   Financial Resource Strain: Not on file  Food Insecurity: Not on file  Transportation Needs: Not on file  Physical Activity: Not on file  Stress: Not on file  Social Connections: Not on file  Intimate Partner Violence: Not on file    FAMILY HISTORY: Family History  Problem Relation Age of Onset  . Cancer Father   . Cancer Brother     ALLERGIES:  is allergic to lisinopril and  penicillins.  MEDICATIONS:  Current Outpatient Medications  Medication Sig Dispense Refill  . Carboxymethylcellulose Sodium (ARTIFICIAL TEARS OP) Place 1 drop into both eyes daily as needed (dry eyes).    Marland Kitchen dexamethasone (DECADRON) 4 MG tablet Take 2 tablets (8 mg total) by mouth daily. Start the day after bendamustine chemotherapy for 2 days. Take with food. 30 tablet 1  . latanoprost (XALATAN) 0.005 % ophthalmic solution Place 1 drop into both eyes at bedtime.    . lidocaine-prilocaine (EMLA) cream Apply to affected area once 30 g 3  . LORazepam (ATIVAN) 0.5 MG tablet Take 1 tablet (0.5 mg total) by mouth every 6 (six) hours as needed (Nausea or vomiting). 30 tablet 0  . losartan-hydrochlorothiazide (HYZAAR) 50-12.5 MG tablet TAKE 1 TABLET BY MOUTH EVERY DAY 90 tablet 0  .  ondansetron (ZOFRAN) 8 MG tablet Take 1 tablet (8 mg total) by mouth 2 (two) times daily as needed for refractory nausea / vomiting. Start on day 2 after bendamustine chemo. 30 tablet 1  . pravastatin (PRAVACHOL) 40 MG tablet Take 1 tablet by mouth once daily 90 tablet 0  . prochlorperazine (COMPAZINE) 10 MG tablet Take 1 tablet (10 mg total) by mouth every 6 (six) hours as needed (Nausea or vomiting). 30 tablet 1   No current facility-administered medications for this visit.    REVIEW OF SYSTEMS:   A 10+ POINT REVIEW OF SYSTEMS WAS OBTAINED including neurology, dermatology, psychiatry, cardiac, respiratory, lymph, extremities, GI, GU, Musculoskeletal, constitutional, breasts, reproductive, HEENT.  All pertinent positives are noted in the HPI.  All others are negative.   PHYSICAL EXAMINATION: ECOG FS:1 - Symptomatic but completely ambulatory  There were no vitals filed for this visit. Wt Readings from Last 3 Encounters:  03/16/20 168 lb (76.2 kg)  03/11/20 167 lb 3.2 oz (75.8 kg)  02/12/20 172 lb (78 kg)   There is no height or weight on file to calculate BMI.    NAD GENERAL:alert, in no acute distress and  comfortable SKIN: no acute rashes, no significant lesions EYES: conjunctiva are pink and non-injected, sclera anicteric OROPHARYNX: MMM, no exudates, no oropharyngeal erythema or ulceration NECK: supple, no JVD LYMPH:  Rt cervical LN reduced in size. LUNGS: clear to auscultation b/l with normal respiratory effort HEART: regular rate & rhythm ABDOMEN:  normoactive bowel sounds , non tender, not distended. No palpable hepatosplenomegaly.  Extremity: no pedal edema PSYCH: alert & oriented x 3 with fluent speech NEURO: no focal motor/sensory deficits  LABORATORY DATA:  I have reviewed the data as listed  . CBC Latest Ref Rng & Units 03/25/2020 03/23/2020 03/11/2020  WBC 4.0 - 10.5 K/uL 7.1 7.9 9.3  Hemoglobin 13.0 - 17.0 g/dL 12.7(L) 13.1 13.4  Hematocrit 39.0 - 52.0 % 39.0 40.5 41.3  Platelets 150 - 400 K/uL 107(L) 113(L) 133(L)    . CMP Latest Ref Rng & Units 03/25/2020 03/23/2020 03/11/2020  Glucose 70 - 99 mg/dL 155(H) 120(H) 65(L)  BUN 8 - 23 mg/dL _0 Creatinine 0.61 - 1.24 mg/dL 0.82 0.93 0.91  Sodium 135 - 145 mmol/L 139 140 141  Potassium 3.5 - 5.1 mmol/L 3.6 3.9 3.8  Chloride 98 - 111 mmol/L 103 102 102  CO2 22 - 32 mmol/L _1 Calcium 8.9 - 10.3 mg/dL 9.0 9.2 9.2  Total Protein 6.5 - 8.1 g/dL 6.9 7.1 7.7  Total Bilirubin 0.3 - 1.2 mg/dL 0.5 0.4 0.5  Alkaline Phos 38 - 126 U/L 80 79 91  AST 15 - 41 U/L _2 ALT 0 - 44 U/L _3 . Lab Results  Component Value Date   LDH 151 03/23/2020   02/23/2020 Right Cervical Lymph Node Surgical Pathology [phone]):    02/23/2020 FISH Analysis:   02/23/2020 FISH Mutation Analysis:    03/03/2019 NM PET Image Restag (PS) Skull Base To Thigh (Accession [id number]):   07/08/18 Right Cervical Tissue Biopsy:    RADIOGRAPHIC STUDIES: I have personally reviewed the radiological images as listed and agreed with the findings in the report. No results found.  ASSESSMENT & PLAN:   87 y.o. male  with  1. Diffuse Large B-Cell Lymphoma, Stage IV Presenting without constitutional symptoms. Palpable right cervical and supraclavicular lymphadenopathy.   07/08/18 Right cervical soft tissue biopsy revealed Diffuse Large B-Cell  Lymphoma, germinal center type   06/05/18 CT Neck revealed Pathologic RIGHT-sided level II, III, IV, and V lymphadenopathy, most consistent with metastatic carcinoma. An obvious primary source is not identified. Tissue sampling is warranted.  07/16/18 Hep B and Hep C negative  07/17/18 ECHO revealed LV EF of 60-65%  07/22/18 PET/CT revealed "Hypermetabolic adenopathy especially concentrated in the right neck but also in the left neck, chest, abdomen, and pelvis. The adenopathy is primarily Deauville 5 although some few scattered smaller lesions are Deauville 4. 2. Diffuse abnormal splenic activity, Deauville 5, without overt splenomegaly. 3. There is also hypermetabolic Deauville 5 activity in the mildly thickened distal appendix, and raising suspicion for involvement of the appendix. 4. Other imaging findings of potential clinical significance: Aortic Atherosclerosis. Coronary atherosclerosis."  10/03/2018 PET scan revealed "Interval response to therapy with stable to decreased size of lymph nodes on CT and generalized decrease in hypermetabolism of the abnormal nodes. Hypermetabolism in the lymph nodes today is compatible with a combination of Deauville 3 and Deauville 4 disease. Stable tiny bilateral pulmonary nodules. Increase radiotracer accumulation in the marrow space on today's study, presumably representing marrow stimulatory effects of therapy."  2. Non-Hodgkin's B-cell Lymphoma - most likely Mantle Cell 02/23/2020 Right Cervical Lymph Node Surgical Pathology [phone]) which revealed "Non-Hodgkin B-cell lymphoma. Ki-67 generally shows low expression (<5-15%)." 02/23/2020 FISH Mutation Analysis which revealed "t(11;14): DETECTED".   PLAN: -labs - reviewed  with patient - stable. -The pt has no prohibitive toxicities from continuing Benamustine/Rituxan at this time. -will continue to need G-CSF support due to extensive previous chemotherapy and age --with limited bone marrow reserve.   FOLLOW UP: Plz schedule C2 of BR treatment (D1 and D2 and D3) as ordered Portflush, labs and MD visit with C2D1   The total time spent in the appt was 20 minutes and more than 50% was on counseling and direct patient cares.  All of the patient's questions were answered with apparent satisfaction. The patient knows to call the clinic with any problems, questions or concerns.   Sullivan Lone MD [place] AAHIVMS [facility] Hematology/Oncology Physician [facility]  (Office):       [phone] (Work cell):  [phone] (Fax):           [phone]  04/07/2020 3:09 AM  I, Yevette Edwards, am acting as a scribe for Dr. Sullivan Lone.   .I have reviewed the above documentation for accuracy and completeness, and I agree with the above. Brunetta Genera MD

## 2020-04-21 ENCOUNTER — Other Ambulatory Visit: Payer: Self-pay | Admitting: Family Medicine

## 2020-04-21 DIAGNOSIS — I10 Essential (primary) hypertension: Secondary | ICD-10-CM

## 2020-04-21 NOTE — Progress Notes (Signed)
HEMATOLOGY/ONCOLOGY CLINIC NOTE  Date of Service: 04/21/2020  Patient Care Team: Denita Lung, MD as PCP - General (Family Medicine)  CHIEF COMPLAINTS/PURPOSE OF CONSULTATION:  Diffuse Large B-Cell Lymphoma  HISTORY OF PRESENTING ILLNESS:   Mario Garcia is a wonderful 87 y.o. male who has been referred to [place] by Dr. Jill Alexanders for evaluation and management of Diffuse Large B-Cell Lymphoma. The pt reports that he is doing well overall.   The pt reports that he feels that he has been a little more tired recently. He first noticed some "nodules" on his neck appear "4-6 weeks ago." He notes that these nodules haven't been painful. He appears to have presented to care with his PCP on 05/28/18, Dr. Redmond School. He notes that he has noticed that he has been sweating more in the last year. He denies noticing any other new lumps or bumps. He denies any fevers, chills, drenching night sweats, or unexpected weight loss. He notes that he has had some changes in swallowing over the last 6 months, which he characterizes as "getting strangled on his saliva every now and then." He denies abdominal pains, acid reflux, changes in bowel habits, leg swelling, skin rashes. He endorses a deep pain "every once in a while," in his left groin. He denies headaches. He endorses glaucoma and a history of cataracts. He sees Dr. Hetty Blend in ophthalmology. He denies any other concern in the last 6 months.  The pt notes that he lives independently with his wife and still is able to do all he wants to do. He walks on trails with his wife and notes that he can generally walk as far as he likes to.  The pt notes that he had prostate cancer in 2008 or 2009, s/p prostatectomy. He did not require RT nor systemic treatments. He denies lung, heart or kidney problems.  Of note prior to the patient's visit today, pt has had a CT Neck completed on 06/05/18 with results revealing Pathologic RIGHT-sided level II, III, IV, and V  lymphadenopathy, most consistent with metastatic carcinoma. An obvious primary source is not identified. Tissue sampling is warranted.  Most recent lab results (07/08/18) of CBC and CMP is as follows: all values are WNL except for PLT at 129k.  On review of systems, pt reports slightly more tired, neck nodules, some changes in swallowing, staying active, and denies fevers, chills, drenching night sweats, unexpected weight loss, abdominal pains, changes in bowel habits, skin rashes, leg swelling, CP, SOB, difficulty breathing, headaches, other lumps or bumps, skin lesions, mouth sores, pain along the spine, back pain, leg swelling, and any other symptoms.   On PMHx the pt reports localized Prostate cancer in 2008 s/p prostatectomy.  On Social Hx the pt reports that he quit smoking over 50 years ago. He notes that he consumes about 1 glass of wine every day, without concerns for excessive drinking. 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 He denies concern for chemical or radiation exposure. On Family Hx the pt reports wife with Stage IV Non Hodgkin's Lymphoma, paternal grandmother with leukemia in her 60s, father with unspecified abdominal cancer.   Interval History:   [name] returns today for management and evaluation of his mantle cell lymphoma and a toxicity check with C2D1 of BR. The patient's last visit with [place] was on 04/07/2020 The pt reports that he is doing well overall.  The pt reports that he has no  new concerns and has been improving since the first two weeks of treatment. His diet and appetite has been improving. He notes some tenderness in the right neck area, but no rash or bump. This is sore to the touch, with no tingling.  The pt notes he has received his Shingles vaccine.  Lab results today 04/22/2020 of CBC w/diff and CMP is as follows: all values are WNL except for RBC of 4.15, Hgb of 12.2, Hct of 37.4, Plt of 102K, Glucose of 100.  On  review of systems, pt denies fevers, chills, night sweats, skin rashes, changes in bowel habits, problems passing urine, mouth sores and any other symptoms.  MEDICAL HISTORY:  Past Medical History:  Diagnosis Date  . Cancer ([place])    PROSTATE  . History of colonic polyps   . History of kidney stones   . Hypertension   . Inguinal hernia 03/2002  . Pneumonia    "years ago"    SURGICAL HISTORY: Past Surgical History:  Procedure Laterality Date  . COLONOSCOPY    . DIRECT LARYNGOSCOPY N/A 07/08/2018   Procedure: DIRECT LARYNGOSCOPY;  Surgeon: Leta Baptist, MD;  Location: [facility];  Service: ENT;  Laterality: N/A;  . ESOPHAGOSCOPY N/A 07/08/2018   Procedure: ESOPHAGOSCOPY;  Surgeon: Leta Baptist, MD;  Location: [facility];  Service: ENT;  Laterality: N/A;  . FLEXIBLE BRONCHOSCOPY N/A 07/08/2018   Procedure: FLEXIBLE BRONCHOSCOPY;  Surgeon: Leta Baptist, MD;  Location: [facility];  Service: ENT;  Laterality: N/A;  . HERNIA REPAIR Left    inguinal hernia  . IR IMAGING GUIDED PORT INSERTION  07/30/2018  . MASS BIOPSY Right 07/08/2018   Procedure: RIGHT NECK MASS EXCISIONAL BIOPSY;  Surgeon: Leta Baptist, MD;  Location: [facility];  Service: ENT;  Laterality: Right;  . PATELLA FRACTURE SURGERY Left   . PROSTATE SURGERY  10/2005   RADIAL PROSTATECTOMY  . ROTATOR CUFF REPAIR Bilateral     SOCIAL HISTORY: Social History   Socioeconomic History  . Marital status: Married    Spouse name: Not on file  . Number of children: Not on file  . Years of education: Not on file  . Highest education level: Not on file  Occupational History  . Not on file  Tobacco Use  . Smoking status: Former Research scientist (life sciences)  . Smokeless tobacco: Never Used  . Tobacco comment: stopped 60 years ago  Vaping Use  . Vaping Use: Never used  Substance and Sexual Activity  . Alcohol use: Yes    Alcohol/week: 6.0 standard drinks    Types: 6 Standard drinks or equivalent per week  . Drug use: No  . Sexual activity: Not Currently  Other Topics Concern  . Not  on file  Social History Narrative  . Not on file   Social Determinants of Health   Financial Resource Strain: Not on file  Food Insecurity: Not on file  Transportation Needs: Not on file  Physical Activity: Not on file  Stress: Not on file  Social Connections: Not on file  Intimate Partner Violence: Not on file    FAMILY HISTORY: Family History  Problem Relation Age of Onset  . Cancer Father   . Cancer Brother     ALLERGIES:  is allergic to lisinopril and penicillins.  MEDICATIONS:  Current Outpatient Medications  Medication Sig Dispense Refill  . Carboxymethylcellulose Sodium (ARTIFICIAL TEARS OP) Place 1 drop into both eyes daily as needed (dry eyes).    Marland Kitchen dexamethasone (DECADRON) 4 MG tablet Take 2 tablets (8  mg total) by mouth daily. Start the day after bendamustine chemotherapy for 2 days. Take with food. 30 tablet 1  . latanoprost (XALATAN) 0.005 % ophthalmic solution Place 1 drop into both eyes at bedtime.    . lidocaine-prilocaine (EMLA) cream Apply to affected area once 30 g 3  . LORazepam (ATIVAN) 0.5 MG tablet Take 1 tablet (0.5 mg total) by mouth every 6 (six) hours as needed (Nausea or vomiting). 30 tablet 0  . losartan-hydrochlorothiazide (HYZAAR) 50-12.5 MG tablet TAKE 1 TABLET BY MOUTH EVERY DAY 90 tablet 0  . ondansetron (ZOFRAN) 8 MG tablet Take 1 tablet (8 mg total) by mouth 2 (two) times daily as needed for refractory nausea / vomiting. Start on day 2 after bendamustine chemo. 30 tablet 1  . pravastatin (PRAVACHOL) 40 MG tablet Take 1 tablet by mouth once daily 90 tablet 0  . prochlorperazine (COMPAZINE) 10 MG tablet Take 1 tablet (10 mg total) by mouth every 6 (six) hours as needed (Nausea or vomiting). 30 tablet 1   No current facility-administered medications for this visit.    REVIEW OF SYSTEMS:   10 Point review of Systems was done is negative except as noted above.  PHYSICAL EXAMINATION: ECOG FS:1 - Symptomatic but completely ambulatory  There  were no vitals filed for this visit. Wt Readings from Last 3 Encounters:  04/07/20 167 lb 6.4 oz (75.9 kg)  03/16/20 168 lb (76.2 kg)  03/11/20 167 lb 3.2 oz (75.8 kg)   Body mass index is 24.4 kg/m.     GENERAL:alert, in no acute distress and comfortable SKIN: no acute rashes, no significant lesions EYES: conjunctiva are pink and non-injected, sclera anicteric OROPHARYNX: MMM, no exudates, no oropharyngeal erythema or ulceration NECK: supple, no JVD LYMPH:  no palpable lymphadenopathy in the cervical, axillary or inguinal regions LUNGS: clear to auscultation b/l with normal respiratory effort HEART: regular rate & rhythm ABDOMEN:  normoactive bowel sounds , non tender, not distended. Extremity: no pedal edema PSYCH: alert & oriented x 3 with fluent speech NEURO: no focal motor/sensory deficits    LABORATORY DATA:  I have reviewed the data as listed  . CBC Latest Ref Rng & Units 04/22/2020 04/07/2020 03/25/2020  WBC 4.0 - 10.5 K/uL 6.7 12.5(H) 7.1  Hemoglobin 13.0 - 17.0 g/dL 12.2(L) 11.7(L) 12.7(L)  Hematocrit 39.0 - 52.0 % 37.4(L) 35.9(L) 39.0  Platelets 150 - 400 K/uL 102(L) 92(L) 107(L)    . CMP Latest Ref Rng & Units 04/22/2020 04/07/2020 03/25/2020  Glucose 70 - 99 mg/dL 100(H) 95 155(H)  BUN 8 - 23 mg/dL _0 Creatinine 0.61 - 1.24 mg/dL 0.85 1.10 0.82  Sodium 135 - 145 mmol/L 141 141 139  Potassium 3.5 - 5.1 mmol/L 3.6 3.6 3.6  Chloride 98 - 111 mmol/L 104 101 103  CO2 22 - 32 mmol/L _1 Calcium 8.9 - 10.3 mg/dL 9.0 8.6(L) 9.0  Total Protein 6.5 - 8.1 g/dL 7.1 6.7 6.9  Total Bilirubin 0.3 - 1.2 mg/dL 0.5 0.7 0.5  Alkaline Phos 38 - 126 U/L 90 120 80  AST 15 - 41 U/L _2 ALT 0 - 44 U/L _3 . Lab Results  Component Value Date   LDH 151 03/23/2020   02/23/2020 Right Cervical Lymph Node Surgical Pathology [phone]):    02/23/2020 [place] Analysis:   02/23/2020 FISH Mutation Analysis:    03/03/2019 NM PET Image Restag (PS)  Skull Base To Thigh (Accession [id number]):  07/08/18 Right Cervical Tissue Biopsy:    RADIOGRAPHIC STUDIES: I have personally reviewed the radiological images as listed and agreed with the findings in the report. No results found.  ASSESSMENT & PLAN:   87 y.o. male with  1. H/o Diffuse Large B-Cell Lymphoma, Stage IV- now in remission Presenting without constitutional symptoms. Palpable right cervical and supraclavicular lymphadenopathy.   07/08/18 Right cervical soft tissue biopsy revealed Diffuse Large B-Cell Lymphoma, germinal center type   06/05/18 CT Neck revealed Pathologic RIGHT-sided level II, III, IV, and V lymphadenopathy, most consistent with metastatic carcinoma. An obvious primary source is not identified. Tissue sampling is warranted.  07/16/18 Hep B and Hep C negative  07/17/18 ECHO revealed LV EF of 60-65%  07/22/18 PET/CT revealed "Hypermetabolic adenopathy especially concentrated in the right neck but also in the left neck, chest, abdomen, and pelvis. The adenopathy is primarily Deauville 5 although some few scattered smaller lesions are Deauville 4. 2. Diffuse abnormal splenic activity, Deauville 5, without overt splenomegaly. 3. There is also hypermetabolic Deauville 5 activity in the mildly thickened distal appendix, and raising suspicion for involvement of the appendix. 4. Other imaging findings of potential clinical significance: Aortic Atherosclerosis. Coronary atherosclerosis."  10/03/2018 PET scan revealed "Interval response to therapy with stable to decreased size of lymph nodes on CT and generalized decrease in hypermetabolism of the abnormal nodes. Hypermetabolism in the lymph nodes today is compatible with a combination of Deauville 3 and Deauville 4 disease. Stable tiny bilateral pulmonary nodules. Increase radiotracer accumulation in the marrow space on today's study, presumably representing marrow stimulatory effects of therapy."  2. Currently with new  Non-Hodgkin's B-cell Lymphoma - consistent with Mantle Cell 02/23/2020 Right Cervical Lymph Node Surgical Pathology [id number]) which revealed "Non-Hodgkin B-cell lymphoma. Ki-67 generally shows low expression (<5-15%)." 02/23/2020 FISH Mutation Analysis which revealed "t(11;14): DETECTED". PLAN: -Discussed pt labwork today, 04/22/2020; counts stable and chemistries normal. Plt are bouncing back and are adequate for treatment today. -The pt has no prohibitive toxicities from continuing Benamustine/Rituxan at this time. -Continue the same dosage of 59m/m^2 Benamustine. Continue Udenyca growth factor support given patients age and previous chemotherapy. -Advised pt we need to go on preventative antiviral to prevent Shingles resurgence. -Will get PET scan after third cycle. If good, will only need 1 more cycle. -Rx Acyclovir. -Will see back with C3D1.   FOLLOW UP: Please schedule cycle 3 of bendamustine Rituxan as per orders [day 1, day 2 and day 3] Port flush and labs on cycle 3-day 1 MD visit on cycle 3-day 1    The total time spent in the appointment was 25 minutes and more than 50% was on counseling and direct patient cares, ordering and management of chemo-Immunotherapy  All of the patient's questions were answered with apparent satisfaction. The patient knows to call the clinic with any problems, questions or concerns.   GSullivan LoneMD MWintergreenAAHIVMS SSaint Thomas Hickman HospitalCSelect Specialty Hospital - LincolnHematology/Oncology Physician CMetrowest [facility] (Office):       [phone](Work cell):  [phone](Fax):           [phone] 04/21/2020 11:39 AM  I, RReinaldo Raddle am acting as scribe for Dr. GSullivan Lone MD.   .I have reviewed the above documentation for accuracy and completeness, and I agree with the above. .Brunetta GeneraMD

## 2020-04-22 ENCOUNTER — Inpatient Hospital Stay (HOSPITAL_BASED_OUTPATIENT_CLINIC_OR_DEPARTMENT_OTHER): Payer: Medicare Other | Admitting: Hematology

## 2020-04-22 ENCOUNTER — Inpatient Hospital Stay: Payer: Medicare Other

## 2020-04-22 ENCOUNTER — Other Ambulatory Visit: Payer: Self-pay

## 2020-04-22 VITALS — BP 105/54 | HR 60 | Temp 98.2°F | Resp 18

## 2020-04-22 VITALS — BP 131/66 | HR 77 | Temp 97.8°F | Resp 18 | Ht 69.0 in | Wt 165.2 lb

## 2020-04-22 DIAGNOSIS — Z79899 Other long term (current) drug therapy: Secondary | ICD-10-CM | POA: Diagnosis not present

## 2020-04-22 DIAGNOSIS — Z87891 Personal history of nicotine dependence: Secondary | ICD-10-CM | POA: Diagnosis not present

## 2020-04-22 DIAGNOSIS — C8318 Mantle cell lymphoma, lymph nodes of multiple sites: Secondary | ICD-10-CM

## 2020-04-22 DIAGNOSIS — Z95828 Presence of other vascular implants and grafts: Secondary | ICD-10-CM

## 2020-04-22 DIAGNOSIS — I10 Essential (primary) hypertension: Secondary | ICD-10-CM | POA: Diagnosis not present

## 2020-04-22 DIAGNOSIS — Z7189 Other specified counseling: Secondary | ICD-10-CM

## 2020-04-22 DIAGNOSIS — Z5111 Encounter for antineoplastic chemotherapy: Secondary | ICD-10-CM | POA: Diagnosis not present

## 2020-04-22 DIAGNOSIS — Z5189 Encounter for other specified aftercare: Secondary | ICD-10-CM | POA: Diagnosis not present

## 2020-04-22 DIAGNOSIS — Z5112 Encounter for antineoplastic immunotherapy: Secondary | ICD-10-CM | POA: Diagnosis not present

## 2020-04-22 DIAGNOSIS — C8330 Diffuse large B-cell lymphoma, unspecified site: Secondary | ICD-10-CM | POA: Diagnosis not present

## 2020-04-22 LAB — CMP (CANCER CENTER ONLY)
ALT: 11 U/L (ref 0–44)
AST: 17 U/L (ref 15–41)
Albumin: 3.8 g/dL (ref 3.5–5.0)
Alkaline Phosphatase: 90 U/L (ref 38–126)
Anion gap: 11 (ref 5–15)
BUN: 17 mg/dL (ref 8–23)
CO2: 26 mmol/L (ref 22–32)
Calcium: 9 mg/dL (ref 8.9–10.3)
Chloride: 104 mmol/L (ref 98–111)
Creatinine: 0.85 mg/dL (ref 0.61–1.24)
GFR, Estimated: >60 mL/min (ref >60)
Glucose, Bld: 100 mg/dL — ABNORMAL HIGH (ref 70–99)
Potassium: 3.6 mmol/L (ref 3.5–5.1)
Sodium: 141 mmol/L (ref 135–145)
Total Bilirubin: 0.5 mg/dL (ref 0.3–1.2)
Total Protein: 7.1 g/dL (ref 6.5–8.1)

## 2020-04-22 LAB — CBC WITH DIFFERENTIAL/PLATELET
Abs Immature Granulocytes: 0.03 10*3/uL (ref 0.00–0.07)
Basophils Absolute: 0 10*3/uL (ref 0.0–0.1)
Basophils Relative: 0 %
Eosinophils Absolute: 0.1 10*3/uL (ref 0.0–0.5)
Eosinophils Relative: 2 %
HCT: 37.4 % — ABNORMAL LOW (ref 39.0–52.0)
Hemoglobin: 12.2 g/dL — ABNORMAL LOW (ref 13.0–17.0)
Immature Granulocytes: 0 %
Lymphocytes Relative: 22 %
Lymphs Abs: 1.5 10*3/uL (ref 0.7–4.0)
MCH: 29.4 pg (ref 26.0–34.0)
MCHC: 32.6 g/dL (ref 30.0–36.0)
MCV: 90.1 fL (ref 80.0–100.0)
Monocytes Absolute: 1 10*3/uL (ref 0.1–1.0)
Monocytes Relative: 14 %
Neutro Abs: 4.1 10*3/uL (ref 1.7–7.7)
Neutrophils Relative %: 62 %
Platelets: 102 10*3/uL — ABNORMAL LOW (ref 150–400)
RBC: 4.15 MIL/uL — ABNORMAL LOW (ref 4.22–5.81)
RDW: 13.3 % (ref 11.5–15.5)
WBC: 6.7 10*3/uL (ref 4.0–10.5)
nRBC: 0 % (ref 0.0–0.2)

## 2020-04-22 MED ORDER — HEPARIN SOD (PORK) LOCK FLUSH 100 UNIT/ML IV SOLN
500.0000 [IU] | Freq: Once | INTRAVENOUS | Status: AC | PRN
  Administered 2020-04-22: 500 [IU]
  Filled 2020-04-22: qty 5

## 2020-04-22 MED ORDER — SODIUM CHLORIDE 0.9 % IV SOLN
10.0000 mg | Freq: Once | INTRAVENOUS | Status: AC
  Administered 2020-04-22: 10 mg via INTRAVENOUS
  Filled 2020-04-22: qty 10

## 2020-04-22 MED ORDER — ACYCLOVIR 400 MG PO TABS: 400.0000 mg | ORAL_TABLET | Freq: Two times a day (BID) | ORAL | 3 refills | Status: DC

## 2020-04-22 MED ORDER — SODIUM CHLORIDE 0.9% FLUSH
10.0000 mL | INTRAVENOUS | Status: DC | PRN
  Administered 2020-04-22: 10 mL
  Filled 2020-04-22: qty 10

## 2020-04-22 MED ORDER — PALONOSETRON HCL INJECTION 0.25 MG/5ML
0.2500 mg | Freq: Once | INTRAVENOUS | Status: AC
  Administered 2020-04-22: 0.25 mg via INTRAVENOUS

## 2020-04-22 MED ORDER — SODIUM CHLORIDE 0.9 % IV SOLN: 375.0000 mg/m2 | Freq: Once | INTRAVENOUS | Status: DC

## 2020-04-22 MED ORDER — ACETAMINOPHEN 325 MG PO TABS
650.0000 mg | ORAL_TABLET | Freq: Once | ORAL | Status: AC
  Administered 2020-04-22: 650 mg via ORAL

## 2020-04-22 MED ORDER — SODIUM CHLORIDE 0.9 % IV SOLN
Freq: Once | INTRAVENOUS | Status: AC
  Filled 2020-04-22: qty 250

## 2020-04-22 MED ORDER — BENDAMUSTINE HCL CHEMO INJECTION 100 MG/4ML
70.0000 mg/m2 | Freq: Once | INTRAVENOUS | Status: AC
  Administered 2020-04-22: 125 mg via INTRAVENOUS
  Filled 2020-04-22: qty 5

## 2020-04-22 MED ORDER — DIPHENHYDRAMINE HCL 25 MG PO CAPS
50.0000 mg | ORAL_CAPSULE | Freq: Once | ORAL | Status: AC
  Administered 2020-04-22: 50 mg via ORAL

## 2020-04-22 MED ORDER — DIPHENHYDRAMINE HCL 25 MG PO CAPS
ORAL_CAPSULE | ORAL | Status: AC
  Filled 2020-04-22: qty 2

## 2020-04-22 MED ORDER — SODIUM CHLORIDE 0.9 % IV SOLN
375.0000 mg/m2 | Freq: Once | INTRAVENOUS | Status: AC
  Administered 2020-04-22: 700 mg via INTRAVENOUS
  Filled 2020-04-22: qty 50

## 2020-04-22 MED ORDER — ACETAMINOPHEN 325 MG PO TABS
ORAL_TABLET | ORAL | Status: AC
  Filled 2020-04-22: qty 2

## 2020-04-22 MED ORDER — PALONOSETRON HCL INJECTION 0.25 MG/5ML
INTRAVENOUS | Status: AC
  Filled 2020-04-22: qty 5

## 2020-04-22 NOTE — Progress Notes (Signed)
Rapid Infusion Rituximab Pharmacist Evaluation  Mario Garcia is a 87 y.o. male being treated with rituximab for NHL. This patient may not be considered for RIR.   A pharmacist has verified the patient tolerated rituximab infusions per the [facility] standard infusion protocol without grade 3-4 infusion reactions. The treatment plan will be updated to reflect RIR if the patient qualifies per the checklist below:   Age > 42 years old Yes   Clinically significant cardiovascular disease No   Circulating lymphocyte count < 5000/uL prior to cycle two Yes  Lab Results  Component Value Date   LYMPHSABS 1.5 04/22/2020    Prior documented grade 3-4 infusion reaction to rituximab No   Prior documented grade 1-2 infusion reaction to rituximab (If YES, Pharmacist will confirm with Physician if patient is still a candidate for RIR) No   Previous rituximab infusion within the past 6 months Yes   Treatment Plan updated orders to reflect RIR Yes    Mario Garcia does meet the criteria for Rapid Infusion Rituximab. This patient is going to be switched to rapid infusion rituximab.   Elsie Lincoln, PharmD 04/22/20 10:56 AM

## 2020-04-22 NOTE — Patient Instructions (Signed)
Cancer Center Discharge Instructions for Patients Receiving Chemotherapy  Today you received the following chemotherapy agents: Rituximab, Bendeka  To help prevent nausea and vomiting after your treatment, we encourage you to take your nausea medication as directed   If you develop nausea and vomiting that is not controlled by your nausea medication, call the clinic.   BELOW ARE SYMPTOMS THAT SHOULD BE REPORTED IMMEDIATELY:  *FEVER GREATER THAN 100.5 F  *CHILLS WITH OR WITHOUT FEVER  NAUSEA AND VOMITING THAT IS NOT CONTROLLED WITH YOUR NAUSEA MEDICATION  *UNUSUAL SHORTNESS OF BREATH  *UNUSUAL BRUISING OR BLEEDING  TENDERNESS IN MOUTH AND THROAT WITH OR WITHOUT PRESENCE OF ULCERS  *URINARY PROBLEMS  *BOWEL PROBLEMS  UNUSUAL RASH Items with * indicate a potential emergency and should be followed up as soon as possible.  Feel free to call the clinic should you have any questions or concerns. The clinic phone number is [phone].  Please show the CHEMO ALERT CARD at check-in to the Emergency Department and triage nurse.   

## 2020-04-23 ENCOUNTER — Inpatient Hospital Stay: Payer: Medicare Other

## 2020-04-23 ENCOUNTER — Other Ambulatory Visit: Payer: Medicare Other

## 2020-04-23 ENCOUNTER — Other Ambulatory Visit: Payer: Self-pay

## 2020-04-23 VITALS — BP 122/55 | HR 68 | Temp 98.2°F | Resp 18 | Wt 168.0 lb

## 2020-04-23 DIAGNOSIS — C8330 Diffuse large B-cell lymphoma, unspecified site: Secondary | ICD-10-CM | POA: Diagnosis not present

## 2020-04-23 DIAGNOSIS — C8318 Mantle cell lymphoma, lymph nodes of multiple sites: Secondary | ICD-10-CM

## 2020-04-23 DIAGNOSIS — Z5189 Encounter for other specified aftercare: Secondary | ICD-10-CM | POA: Diagnosis not present

## 2020-04-23 DIAGNOSIS — Z87891 Personal history of nicotine dependence: Secondary | ICD-10-CM | POA: Diagnosis not present

## 2020-04-23 DIAGNOSIS — Z7189 Other specified counseling: Secondary | ICD-10-CM

## 2020-04-23 DIAGNOSIS — Z5112 Encounter for antineoplastic immunotherapy: Secondary | ICD-10-CM | POA: Diagnosis not present

## 2020-04-23 DIAGNOSIS — I10 Essential (primary) hypertension: Secondary | ICD-10-CM | POA: Diagnosis not present

## 2020-04-23 DIAGNOSIS — Z79899 Other long term (current) drug therapy: Secondary | ICD-10-CM | POA: Diagnosis not present

## 2020-04-23 MED ORDER — SODIUM CHLORIDE 0.9 % IV SOLN
Freq: Once | INTRAVENOUS | Status: AC
  Filled 2020-04-23: qty 250

## 2020-04-23 MED ORDER — SODIUM CHLORIDE 0.9 % IV SOLN
10.0000 mg | Freq: Once | INTRAVENOUS | Status: AC
  Administered 2020-04-23: 10 mg via INTRAVENOUS
  Filled 2020-04-23: qty 10

## 2020-04-23 MED ORDER — HEPARIN SOD (PORK) LOCK FLUSH 100 UNIT/ML IV SOLN
500.0000 [IU] | Freq: Once | INTRAVENOUS | Status: AC | PRN
  Administered 2020-04-23: 500 [IU]
  Filled 2020-04-23: qty 5

## 2020-04-23 MED ORDER — SODIUM CHLORIDE 0.9% FLUSH
10.0000 mL | INTRAVENOUS | Status: DC | PRN
  Administered 2020-04-23: 10 mL
  Filled 2020-04-23: qty 10

## 2020-04-23 MED ORDER — SODIUM CHLORIDE 0.9 % IV SOLN
70.0000 mg/m2 | Freq: Once | INTRAVENOUS | Status: AC
  Administered 2020-04-23: 125 mg via INTRAVENOUS
  Filled 2020-04-23: qty 5

## 2020-04-23 NOTE — Patient Instructions (Signed)
[facility] Discharge Instructions for Patients Receiving Chemotherapy  Today you received the following chemotherapy agents Bendamustine(Bendeka)  To help prevent nausea and vomiting after your treatment, we encourage you to take your nausea medication as directed.   If you develop nausea and vomiting that is not controlled by your nausea medication, call the clinic.   BELOW ARE SYMPTOMS THAT SHOULD BE REPORTED IMMEDIATELY:  *FEVER GREATER THAN 100.5 F  *CHILLS WITH OR WITHOUT FEVER  NAUSEA AND VOMITING THAT IS NOT CONTROLLED WITH YOUR NAUSEA MEDICATION  *UNUSUAL SHORTNESS OF BREATH  *UNUSUAL BRUISING OR BLEEDING  TENDERNESS IN MOUTH AND THROAT WITH OR WITHOUT PRESENCE OF ULCERS  *URINARY PROBLEMS  *BOWEL PROBLEMS  UNUSUAL RASH Items with * indicate a potential emergency and should be followed up as soon as possible.  Feel free to call the clinic should you have any questions or concerns. The clinic phone number is [phone].  Please show the CHEMO ALERT CARD at check-in to the Emergency Department and triage nurse.   

## 2020-04-24 ENCOUNTER — Inpatient Hospital Stay: Payer: Medicare Other

## 2020-04-24 ENCOUNTER — Other Ambulatory Visit: Payer: Self-pay

## 2020-04-24 VITALS — BP 152/55 | HR 75 | Temp 98.6°F | Resp 17

## 2020-04-24 DIAGNOSIS — Z5112 Encounter for antineoplastic immunotherapy: Secondary | ICD-10-CM | POA: Diagnosis not present

## 2020-04-24 DIAGNOSIS — C8330 Diffuse large B-cell lymphoma, unspecified site: Secondary | ICD-10-CM | POA: Diagnosis not present

## 2020-04-24 DIAGNOSIS — Z79899 Other long term (current) drug therapy: Secondary | ICD-10-CM | POA: Diagnosis not present

## 2020-04-24 DIAGNOSIS — C8318 Mantle cell lymphoma, lymph nodes of multiple sites: Secondary | ICD-10-CM

## 2020-04-24 DIAGNOSIS — Z87891 Personal history of nicotine dependence: Secondary | ICD-10-CM | POA: Diagnosis not present

## 2020-04-24 DIAGNOSIS — Z5189 Encounter for other specified aftercare: Secondary | ICD-10-CM | POA: Diagnosis not present

## 2020-04-24 DIAGNOSIS — Z7189 Other specified counseling: Secondary | ICD-10-CM

## 2020-04-24 DIAGNOSIS — I10 Essential (primary) hypertension: Secondary | ICD-10-CM | POA: Diagnosis not present

## 2020-04-24 MED ORDER — PEGFILGRASTIM-CBQV 6 MG/0.6ML ~~LOC~~ SOSY
6.0000 mg | PREFILLED_SYRINGE | Freq: Once | SUBCUTANEOUS | Status: AC
  Administered 2020-04-24: 6 mg via SUBCUTANEOUS

## 2020-05-03 ENCOUNTER — Ambulatory Visit (INDEPENDENT_AMBULATORY_CARE_PROVIDER_SITE_OTHER): Payer: Medicare Other | Admitting: Family Medicine

## 2020-05-03 ENCOUNTER — Other Ambulatory Visit: Payer: Self-pay

## 2020-05-03 ENCOUNTER — Encounter: Payer: Self-pay | Admitting: Family Medicine

## 2020-05-03 VITALS — BP 150/70 | HR 64 | Temp 98.1°F | Ht 70.0 in | Wt 165.8 lb

## 2020-05-03 DIAGNOSIS — B029 Zoster without complications: Secondary | ICD-10-CM | POA: Diagnosis not present

## 2020-05-03 NOTE — Patient Instructions (Signed)
  Your ear appears normal, as does your throat. I do suspect this is likely shingles.  Increase your acyclovir to 2 tablets (800mg ) 5 times daily for the next 5-7 days. For pain--you can continue to use tylenol as needed for pain; you may use ibuprofen sparingly (will cause more bruising, platelets are slightly low). You can try topical medications (lidocaine, since you have it, or icy hot, Zostrix--this is capsaicin cream, be sure to only use this on intact skin, not over any sores or open areas or it will sting/hurt), you can also try ice or heat, whichever works best.  If pain persists/worsens, we should consider using a medication such as gabapentin to help.

## 2020-05-03 NOTE — Progress Notes (Signed)
Chief Complaint  Patient presents with  . Ear Pain    Behind right ear. Is in chemo therapy and Dr.Kale said it might be zoster. No blisters-has some pain when he swallows on the right side.    Patient presents, accompanied by his wife, with complaint of soreness/pain behind his R ear, which started on 1/22.  He mentioned this at his last visit with Dr.Kale, and was prescribed acyclovir. He started this on 1/30 or 1/31, following directions to take it BID (400mg  dose). He was surprised that he received a 90 day supply of the medication.  He reports that the pain is behind the R ear, extends down into the neck. He has a discomfort when he swallows on the right side, not a sore throat. Wife noted 2 small patches in his scalp --a little raised, red.  Never blistered/scabbed. Not there today. He has some skin lesions on his face--one has been there for >1 month and has appt with derm to evaluate.  There is a lesion on his R chin/lower jaw that is new, as well as a scabbed area on his R ear.  Tylenol doesn't help with the discomfort. Ibuprofen does help with pain. He also tried EMLA (that he has for his port) didn't help   PMH, PSH, SH reviewed. Pt with lymphoma Vaccinated for shingles (zostavax and shingrix). Recent counts reviewed: Lab Results  Component Value Date   WBC 6.7 04/22/2020   HGB 12.2 (L) 04/22/2020   HCT 37.4 (L) 04/22/2020   MCV 90.1 04/22/2020   PLT 102 (L) 04/22/2020    Immunization History  Administered Date(s) Administered  . Fluad Quad(high Dose 65+) 12/05/2018, 12/26/2019  . Influenza Split 12/21/2010, 11/29/2011  . Influenza, High Dose Seasonal PF 12/22/2013, 12/03/2014, 12/21/2015, 12/18/2016, 12/13/2017  . Influenza,inj,Quad PF,6+ Mos 12/23/2012  . PFIZER(Purple Top)SARS-COV-2 Vaccination 05/11/2019, 06/03/2019, 12/11/2019  . Pneumococcal Conjugate-13 12/03/2014  . Pneumococcal Polysaccharide-23 08/21/2002  . Td 03/27/1998  . Tdap 09/30/2007  . Zoster  09/30/2007  . Zoster Recombinat (Shingrix) 07/21/2017, 09/20/2017   ROS: no fever, chills, URI symptoms, decreased hearing, URI symptoms, cough, shortness of breath, chest pain. Denies bleeding, bruising, rash, abdominal pain.  PHYSICAL EXAM:  BP (!) 150/70   Pulse 64   Temp 98.1 F (36.7 C) (Tympanic)   Ht 5\' 10"  (1.778 m)   Wt 165 lb 12.8 oz (75.2 kg)   BMI 23.79 kg/m   Well-appearing, pleasant male, in no distress HEENT: conjunctiva and sclera clear, EOMI.  TM and EACs normal bilaterally Skin on scalp appears normal, (one area slightly pink, flat) Sensitive to the touch behind the right ear, into the scalp and down the neck. Actinic changes to the skin on the face (hyperkeratotic/crusted lesion at R temple x 6-8 wks)--small scabbed area on the pinna of the R ear and papule vs pustule at the R lower jaw OP is clear Neck: no lymphadenopathy  Heart: regular rate and rhythm Lungs: clear bilaterally   ASSESSMENT/PLAN:  Herpes zoster without complication - scalp pain with lesion on chin; on inadequate zovirax dose (preventative, not treatment). Increase to 800mg  5x/d. Supportive measures reviewed  Reassured no evidence of any infection or other etiology noted on today's exam. Increase Zovirax to 800mg  5x/d   Your ear appears normal, as does your throat. I do suspect this is likely shingles.  Increase your acyclovir to 2 tablets (800mg ) 5 times daily for the next 5-7 days. For pain--you can continue to use tylenol as needed for pain; you  may use ibuprofen sparingly (will cause more bruising, platelets are slightly low). You can try topical medications (lidocaine, since you have it, icy hot, Zostrix--this is capsaicin cream, be sure to only use this on intact skin, not over any sores or open areas or it will sting/hurt), you can also try ice or heat, whichever works best.  If pain persists/worsens, we should consider using a medication such as gabapentin to help.

## 2020-05-19 NOTE — Progress Notes (Signed)
HEMATOLOGY/ONCOLOGY CLINIC NOTE  Date of Service: 05/19/2020  Patient Care Team: Denita Lung, MD as PCP - General (Family Medicine)  CHIEF COMPLAINTS/PURPOSE OF CONSULTATION:  History of diffuse Large B-Cell Lymphoma Follow-up for continued management of mantle cell lymphoma  HISTORY OF PRESENTING ILLNESS:   Mario Garcia is a wonderful 87 y.o. male who has been referred to [place] by Dr. Jill Alexanders for evaluation and management of Diffuse Large B-Cell Lymphoma. The pt reports that he is doing well overall.   The pt reports that he feels that he has been a little more tired recently. He first noticed some "nodules" on his neck appear "4-6 weeks ago." He notes that these nodules haven't been painful. He appears to have presented to care with his PCP on 05/28/18, Dr. Redmond School. He notes that he has noticed that he has been sweating more in the last year. He denies noticing any other new lumps or bumps. He denies any fevers, chills, drenching night sweats, or unexpected weight loss. He notes that he has had some changes in swallowing over the last 6 months, which he characterizes as "getting strangled on his saliva every now and then." He denies abdominal pains, acid reflux, changes in bowel habits, leg swelling, skin rashes. He endorses a deep pain "every once in a while," in his left groin. He denies headaches. He endorses glaucoma and a history of cataracts. He sees Dr. Hetty Blend in ophthalmology. He denies any other concern in the last 6 months.  The pt notes that he lives independently with his wife and still is able to do all he wants to do. He walks on trails with his wife and notes that he can generally walk as far as he likes to.  The pt notes that he had prostate cancer in 2008 or 2009, s/p prostatectomy. He did not require RT nor systemic treatments. He denies lung, heart or kidney problems.  Of note prior to the patient's visit today, pt has had a CT Neck completed on 06/05/18  with results revealing Pathologic RIGHT-sided level II, III, IV, and V lymphadenopathy, most consistent with metastatic carcinoma. An obvious primary source is not identified. Tissue sampling is warranted.  Most recent lab results (07/08/18) of CBC and CMP is as follows: all values are WNL except for PLT at 129k.  On review of systems, pt reports slightly more tired, neck nodules, some changes in swallowing, staying active, and denies fevers, chills, drenching night sweats, unexpected weight loss, abdominal pains, changes in bowel habits, skin rashes, leg swelling, CP, SOB, difficulty breathing, headaches, other lumps or bumps, skin lesions, mouth sores, pain along the spine, back pain, leg swelling, and any other symptoms.   On PMHx the pt reports localized Prostate cancer in 2008 s/p prostatectomy.  On Social Hx the pt reports that he quit smoking over 50 years ago. He notes that he consumes about 1 glass of wine every day, without concerns for excessive drinking. 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 He denies concern for chemical or radiation exposure. On Family Hx the pt reports wife with Stage IV Non Hodgkin's Lymphoma, paternal grandmother with leukemia in her 25s, father with unspecified abdominal cancer.   Interval History:   Mario Garcia returns today for management and evaluation of his mantle cell lymphoma and a toxicity check with C3D1 of BR. The patient's last visit with [place] was on 04/22/2020.The pt reports that he is doing well  overall. We are joined today by his wife.   The pt reports that he experienced Shingles that made him think he had an ear infection since the last visit. He visited his ENT who confirmed it was Shingles and gave him a higher dosage of the Acyclovir. The pt has had no other infection issues outside of this. This was in the area behind his right ear. The pt notes he still has a scab on his scalp that is bothersome. The pt's wife  notes that it used to be bigger. He also has a scaly lesion on his scalp that is only tender to the touch. The pt was on Valtrex but is currently back on Acyclovir preventative dosage.  The pt notes that his hernia has been giving him more problems lately, with difficulty pushing it back a few weeks ago. He has not seen a surgeon for this yet at this time due to prioritization of treatment. The pt notes that he has been eating and drinking well.  Lab results today 05/20/2020 of CBC w/diff and CMP is as follows: all values are WNL except for  RBC of 3.95, Hgb of 11.7, HCT of 35.4, Plt of 130K.   On review of systems, pt reports improving Shingles infection behind right ear and denies back pain, new lumps/bumps, infection issues, pain along Port site, fevers, chills, abdominal pain, leg swelling, decreased appetite, and any other symptoms.  MEDICAL HISTORY:  Past Medical History:  Diagnosis Date  . Cancer ([place])    PROSTATE  . History of colonic polyps   . History of kidney stones   . Hypertension   . Inguinal hernia 03/2002  . Pneumonia    "years ago"    SURGICAL HISTORY: Past Surgical History:  Procedure Laterality Date  . COLONOSCOPY    . DIRECT LARYNGOSCOPY N/A 07/08/2018   Procedure: DIRECT LARYNGOSCOPY;  Surgeon: Leta Baptist, MD;  Location: [facility];  Service: ENT;  Laterality: N/A;  . ESOPHAGOSCOPY N/A 07/08/2018   Procedure: ESOPHAGOSCOPY;  Surgeon: Leta Baptist, MD;  Location: [facility];  Service: ENT;  Laterality: N/A;  . FLEXIBLE BRONCHOSCOPY N/A 07/08/2018   Procedure: FLEXIBLE BRONCHOSCOPY;  Surgeon: Leta Baptist, MD;  Location: [facility];  Service: ENT;  Laterality: N/A;  . HERNIA REPAIR Left    inguinal hernia  . IR IMAGING GUIDED PORT INSERTION  07/30/2018  . MASS BIOPSY Right 07/08/2018   Procedure: RIGHT NECK MASS EXCISIONAL BIOPSY;  Surgeon: Leta Baptist, MD;  Location: [facility];  Service: ENT;  Laterality: Right;  . PATELLA FRACTURE SURGERY Left   . PROSTATE SURGERY  10/2005   RADIAL  PROSTATECTOMY  . ROTATOR CUFF REPAIR Bilateral     SOCIAL HISTORY: Social History   Socioeconomic History  . Marital status: Married    Spouse name: Not on file  . Number of children: Not on file  . Years of education: Not on file  . Highest education level: Not on file  Occupational History  . Not on file  Tobacco Use  . Smoking status: Former Research scientist (life sciences)  . Smokeless tobacco: Never Used  . Tobacco comment: stopped 60 years ago  Vaping Use  . Vaping Use: Never used  Substance and Sexual Activity  . Alcohol use: Yes    Alcohol/week: 6.0 standard drinks    Types: 6 Standard drinks or equivalent per week  . Drug use: No  . Sexual activity: Not Currently  Other Topics Concern  . Not on file  Social History Narrative  .  Not on file   Social Determinants of Health   Financial Resource Strain: Not on file  Food Insecurity: Not on file  Transportation Needs: Not on file  Physical Activity: Not on file  Stress: Not on file  Social Connections: Not on file  Intimate Partner Violence: Not on file    FAMILY HISTORY: Family History  Problem Relation Age of Onset  . Cancer Father   . Cancer Brother     ALLERGIES:  is allergic to lisinopril and penicillins.  MEDICATIONS:  Current Outpatient Medications  Medication Sig Dispense Refill  . acyclovir (ZOVIRAX) 400 MG tablet Take 1 tablet (400 mg total) by mouth 2 (two) times daily. 60 tablet 3  . Carboxymethylcellulose Sodium (ARTIFICIAL TEARS OP) Place 1 drop into both eyes daily as needed (dry eyes).    Marland Kitchen dexamethasone (DECADRON) 4 MG tablet Take 2 tablets (8 mg total) by mouth daily. Start the day after bendamustine chemotherapy for 2 days. Take with food. 30 tablet 1  . latanoprost (XALATAN) 0.005 % ophthalmic solution Place 1 drop into both eyes at bedtime.    . lidocaine-prilocaine (EMLA) cream Apply to affected area once 30 g 3  . LORazepam (ATIVAN) 0.5 MG tablet Take 1 tablet (0.5 mg total) by mouth every 6 (six) hours as  needed (Nausea or vomiting). 30 tablet 0  . losartan-hydrochlorothiazide (HYZAAR) 50-12.5 MG tablet TAKE 1 TABLET BY MOUTH EVERY DAY 90 tablet 0  . ondansetron (ZOFRAN) 8 MG tablet Take 1 tablet (8 mg total) by mouth 2 (two) times daily as needed for refractory nausea / vomiting. Start on day 2 after bendamustine chemo. (Patient not taking: Reported on 05/03/2020) 30 tablet 1  . pravastatin (PRAVACHOL) 40 MG tablet Take 1 tablet by mouth once daily 90 tablet 0  . prochlorperazine (COMPAZINE) 10 MG tablet Take 1 tablet (10 mg total) by mouth every 6 (six) hours as needed (Nausea or vomiting). (Patient not taking: Reported on 05/03/2020) 30 tablet 1   No current facility-administered medications for this visit.    REVIEW OF SYSTEMS:   10 Point review of Systems was done is negative except as noted above.  PHYSICAL EXAMINATION: ECOG FS:1 - Symptomatic but completely ambulatory  There were no vitals filed for this visit. Wt Readings from Last 3 Encounters:  05/03/20 165 lb 12.8 oz (75.2 kg)  04/23/20 168 lb (76.2 kg)  04/22/20 165 lb 3.2 oz (74.9 kg)   Body mass index is 23.17 kg/m.     GENERAL:alert, in no acute distress and comfortable SKIN: no acute rashes, no significant lesions EYES: conjunctiva are pink and non-injected, sclera anicteric OROPHARYNX: MMM, no exudates, no oropharyngeal erythema or ulceration NECK: supple, no JVD LYMPH:  no palpable lymphadenopathy in the cervical, axillary or inguinal regions LUNGS: clear to auscultation b/l with normal respiratory effort HEART: regular rate & rhythm ABDOMEN:  normoactive bowel sounds , non tender, not distended. Extremity: no pedal edema PSYCH: alert & oriented x 3 with fluent speech NEURO: no focal motor/sensory deficits  LABORATORY DATA:  I have reviewed the data as listed  . CBC Latest Ref Rng & Units 05/20/2020 04/22/2020 04/07/2020  WBC 4.0 - 10.5 K/uL 5.0 6.7 12.5(H)  Hemoglobin 13.0 - 17.0 g/dL 11.7(L) 12.2(L) 11.7(L)   Hematocrit 39.0 - 52.0 % 35.4(L) 37.4(L) 35.9(L)  Platelets 150 - 400 K/uL 130(L) 102(L) 92(L)    . CMP Latest Ref Rng & Units 05/20/2020 04/22/2020 04/07/2020  Glucose 70 - 99 mg/dL 109(H) 100(H) 95  BUN  8 - 23 mg/dL _0 Creatinine 0.61 - 1.24 mg/dL 0.86 0.85 1.10  Sodium 135 - 145 mmol/L 138 141 141  Potassium 3.5 - 5.1 mmol/L 3.7 3.6 3.6  Chloride 98 - 111 mmol/L 104 104 101  CO2 22 - 32 mmol/L _1 Calcium 8.9 - 10.3 mg/dL 8.8(L) 9.0 8.6(L)  Total Protein 6.5 - 8.1 g/dL 7.0 7.1 6.7  Total Bilirubin 0.3 - 1.2 mg/dL 0.5 0.5 0.7  Alkaline Phos 38 - 126 U/L 67 90 120  AST 15 - 41 U/L _2 ALT 0 - 44 U/L _3 . Lab Results  Component Value Date   LDH 151 03/23/2020   02/23/2020 Right Cervical Lymph Node Surgical Pathology [phone]):    02/23/2020 FISH Analysis:   02/23/2020 FISH Mutation Analysis:    03/03/2019 NM PET Image Restag (PS) Skull Base To Thigh (Accession [id number]):   07/08/18 Right Cervical Tissue Biopsy:    RADIOGRAPHIC STUDIES: I have personally reviewed the radiological images as listed and agreed with the findings in the report. No results found.  ASSESSMENT & PLAN:   87 y.o. male with  1. H/o Diffuse Large B-Cell Lymphoma, Stage IV- now in remission Presenting without constitutional symptoms. Palpable right cervical and supraclavicular lymphadenopathy.   07/08/18 Right cervical soft tissue biopsy revealed Diffuse Large B-Cell Lymphoma, germinal center type   06/05/18 CT Neck revealed Pathologic RIGHT-sided level II, III, IV, and V lymphadenopathy, most consistent with metastatic carcinoma. An obvious primary source is not identified. Tissue sampling is warranted.  07/16/18 Hep B and Hep C negative  07/17/18 ECHO revealed LV EF of 60-65%  07/22/18 PET/CT revealed "Hypermetabolic adenopathy especially concentrated in the right neck but also in the left neck, chest, abdomen, and pelvis. The adenopathy is primarily  Deauville 5 although some few scattered smaller lesions are Deauville 4. 2. Diffuse abnormal splenic activity, Deauville 5, without overt splenomegaly. 3. There is also hypermetabolic Deauville 5 activity in the mildly thickened distal appendix, and raising suspicion for involvement of the appendix. 4. Other imaging findings of potential clinical significance: Aortic Atherosclerosis. Coronary atherosclerosis."  10/03/2018 PET scan revealed "Interval response to therapy with stable to decreased size of lymph nodes on CT and generalized decrease in hypermetabolism of the abnormal nodes. Hypermetabolism in the lymph nodes today is compatible with a combination of Deauville 3 and Deauville 4 disease. Stable tiny bilateral pulmonary nodules. Increase radiotracer accumulation in the marrow space on today's study, presumably representing marrow stimulatory effects of therapy."  2. Currently with new Non-Hodgkin's B-cell Lymphoma - consistent with Mantle Cell 02/23/2020 Right Cervical Lymph Node Surgical Pathology [phone]) which revealed "Non-Hodgkin B-cell lymphoma. Ki-67 generally shows low expression (<5-15%)." 02/23/2020 FISH Mutation Analysis which revealed "t(11;14): DETECTED".  PLAN: -Discussed pt labwork today, 05/20/2020; blood counts stable. Chemistries pending. -Will increase preventative dosage of Acyclovir to 800 mg BID. Pt is aware to continue to monitor spots that look like Shingles. -Recommended pt avoid heavy lifting and overworking himself to not stress the hernia.  -Advised pt that as long as there is no constipation or difficulty pushing hernia back in we will prioritize his mantle cell lymphoma treatment. -The pt has no prohibitive toxicities from continuing C3D1 Benamustine/Rituxan at this time. -Continue the same dosage of 39m/m^2 Benamustine. Continue Udenyca growth factor support given patients age and previous chemotherapy. -Will continue with this cycle as planned. Will get  PET CT` following completion of C3. -Will see back with  C4D1.   FOLLOW UP: Please schedule cycle 4 of bendamustine Rituxan as per orders [day 1, day 2 and day 3] Port flush and labs on cycle 4-day 1 MD visit on cycle 4-day 1 PET/CT in 3 weeks    The total time spent in the appointment was 30 minutes and more than 50% was on counseling and direct patient cares, ordering and management of chemotherapy.  All of the patient's questions were answered with apparent satisfaction. The patient knows to call the clinic with any problems, questions or concerns.   Sullivan Lone MD [place] AAHIVMS [facility] Hematology/Oncology Physician [facility]  (Office):       [phone] (Work cell):  [phone] (Fax):           [phone]   I, Reinaldo Raddle, am acting as scribe for Dr. Sullivan Lone, MD.   .I have reviewed the above documentation for accuracy and completeness, and I agree with the above. Brunetta Genera MD

## 2020-05-20 ENCOUNTER — Other Ambulatory Visit: Payer: Self-pay

## 2020-05-20 ENCOUNTER — Inpatient Hospital Stay: Payer: Medicare Other

## 2020-05-20 ENCOUNTER — Inpatient Hospital Stay: Payer: Medicare Other | Attending: Hematology

## 2020-05-20 ENCOUNTER — Inpatient Hospital Stay (HOSPITAL_BASED_OUTPATIENT_CLINIC_OR_DEPARTMENT_OTHER): Payer: Medicare Other | Admitting: Hematology

## 2020-05-20 VITALS — BP 112/62 | HR 60 | Temp 97.9°F | Resp 16

## 2020-05-20 VITALS — BP 134/60 | HR 63 | Temp 97.1°F | Resp 16 | Ht 70.0 in | Wt 161.5 lb

## 2020-05-20 DIAGNOSIS — Z79899 Other long term (current) drug therapy: Secondary | ICD-10-CM | POA: Insufficient documentation

## 2020-05-20 DIAGNOSIS — Z95828 Presence of other vascular implants and grafts: Secondary | ICD-10-CM

## 2020-05-20 DIAGNOSIS — C8318 Mantle cell lymphoma, lymph nodes of multiple sites: Secondary | ICD-10-CM

## 2020-05-20 DIAGNOSIS — Z5111 Encounter for antineoplastic chemotherapy: Secondary | ICD-10-CM

## 2020-05-20 DIAGNOSIS — C8310 Mantle cell lymphoma, unspecified site: Secondary | ICD-10-CM | POA: Diagnosis not present

## 2020-05-20 DIAGNOSIS — B029 Zoster without complications: Secondary | ICD-10-CM | POA: Diagnosis not present

## 2020-05-20 DIAGNOSIS — Z7189 Other specified counseling: Secondary | ICD-10-CM

## 2020-05-20 DIAGNOSIS — Z5189 Encounter for other specified aftercare: Secondary | ICD-10-CM | POA: Diagnosis not present

## 2020-05-20 DIAGNOSIS — Z5112 Encounter for antineoplastic immunotherapy: Secondary | ICD-10-CM | POA: Insufficient documentation

## 2020-05-20 LAB — CBC WITH DIFFERENTIAL/PLATELET
Abs Immature Granulocytes: 0.01 10*3/uL (ref 0.00–0.07)
Basophils Absolute: 0 10*3/uL (ref 0.0–0.1)
Basophils Relative: 0 %
Eosinophils Absolute: 0 10*3/uL (ref 0.0–0.5)
Eosinophils Relative: 1 %
HCT: 35.4 % — ABNORMAL LOW (ref 39.0–52.0)
Hemoglobin: 11.7 g/dL — ABNORMAL LOW (ref 13.0–17.0)
Immature Granulocytes: 0 %
Lymphocytes Relative: 45 %
Lymphs Abs: 2.2 10*3/uL (ref 0.7–4.0)
MCH: 29.6 pg (ref 26.0–34.0)
MCHC: 33.1 g/dL (ref 30.0–36.0)
MCV: 89.6 fL (ref 80.0–100.0)
Monocytes Absolute: 0.7 10*3/uL (ref 0.1–1.0)
Monocytes Relative: 14 %
Neutro Abs: 2 10*3/uL (ref 1.7–7.7)
Neutrophils Relative %: 40 %
Platelets: 130 10*3/uL — ABNORMAL LOW (ref 150–400)
RBC: 3.95 MIL/uL — ABNORMAL LOW (ref 4.22–5.81)
RDW: 14.4 % (ref 11.5–15.5)
WBC: 5 10*3/uL (ref 4.0–10.5)
nRBC: 0 % (ref 0.0–0.2)

## 2020-05-20 LAB — CMP (CANCER CENTER ONLY)
ALT: 9 U/L (ref 0–44)
AST: 15 U/L (ref 15–41)
Albumin: 4 g/dL (ref 3.5–5.0)
Alkaline Phosphatase: 67 U/L (ref 38–126)
Anion gap: 9 (ref 5–15)
BUN: 19 mg/dL (ref 8–23)
CO2: 25 mmol/L (ref 22–32)
Calcium: 8.8 mg/dL — ABNORMAL LOW (ref 8.9–10.3)
Chloride: 104 mmol/L (ref 98–111)
Creatinine: 0.86 mg/dL (ref 0.61–1.24)
GFR, Estimated: >60 mL/min (ref >60)
Glucose, Bld: 109 mg/dL — ABNORMAL HIGH (ref 70–99)
Potassium: 3.7 mmol/L (ref 3.5–5.1)
Sodium: 138 mmol/L (ref 135–145)
Total Bilirubin: 0.5 mg/dL (ref 0.3–1.2)
Total Protein: 7 g/dL (ref 6.5–8.1)

## 2020-05-20 MED ORDER — ACYCLOVIR 400 MG PO TABS: 800.0000 mg | ORAL_TABLET | Freq: Two times a day (BID) | ORAL | 3 refills | Status: AC

## 2020-05-20 MED ORDER — PALONOSETRON HCL INJECTION 0.25 MG/5ML
0.2500 mg | Freq: Once | INTRAVENOUS | Status: AC
  Administered 2020-05-20: 0.25 mg via INTRAVENOUS

## 2020-05-20 MED ORDER — DIPHENHYDRAMINE HCL 25 MG PO CAPS
ORAL_CAPSULE | ORAL | Status: AC
  Filled 2020-05-20: qty 2

## 2020-05-20 MED ORDER — HEPARIN SOD (PORK) LOCK FLUSH 100 UNIT/ML IV SOLN
500.0000 [IU] | Freq: Once | INTRAVENOUS | Status: AC | PRN
  Administered 2020-05-20: 500 [IU]
  Filled 2020-05-20: qty 5

## 2020-05-20 MED ORDER — SODIUM CHLORIDE 0.9% FLUSH
10.0000 mL | INTRAVENOUS | Status: DC | PRN
  Administered 2020-05-20: 10 mL
  Filled 2020-05-20: qty 10

## 2020-05-20 MED ORDER — SODIUM CHLORIDE 0.9 % IV SOLN
10.0000 mg | Freq: Once | INTRAVENOUS | Status: AC
  Administered 2020-05-20: 10 mg via INTRAVENOUS
  Filled 2020-05-20: qty 10

## 2020-05-20 MED ORDER — PALONOSETRON HCL INJECTION 0.25 MG/5ML
INTRAVENOUS | Status: AC
  Filled 2020-05-20: qty 5

## 2020-05-20 MED ORDER — SODIUM CHLORIDE 0.9 % IV SOLN
70.0000 mg/m2 | Freq: Once | INTRAVENOUS | Status: AC
  Administered 2020-05-20: 125 mg via INTRAVENOUS
  Filled 2020-05-20: qty 5

## 2020-05-20 MED ORDER — SODIUM CHLORIDE 0.9 % IV SOLN
375.0000 mg/m2 | Freq: Once | INTRAVENOUS | Status: AC
  Administered 2020-05-20: 700 mg via INTRAVENOUS
  Filled 2020-05-20: qty 50

## 2020-05-20 MED ORDER — DIPHENHYDRAMINE HCL 25 MG PO CAPS
50.0000 mg | ORAL_CAPSULE | Freq: Once | ORAL | Status: AC
  Administered 2020-05-20: 50 mg via ORAL

## 2020-05-20 MED ORDER — ACETAMINOPHEN 325 MG PO TABS
650.0000 mg | ORAL_TABLET | Freq: Once | ORAL | Status: AC
  Administered 2020-05-20: 650 mg via ORAL

## 2020-05-20 MED ORDER — SODIUM CHLORIDE 0.9 % IV SOLN
Freq: Once | INTRAVENOUS | Status: AC
  Filled 2020-05-20: qty 250

## 2020-05-20 MED ORDER — ACETAMINOPHEN 325 MG PO TABS
ORAL_TABLET | ORAL | Status: AC
  Filled 2020-05-20: qty 2

## 2020-05-20 NOTE — Patient Instructions (Signed)
[facility] Discharge Instructions for Patients Receiving Chemotherapy  Today you received the following chemotherapy agents: Rituximab and Bendamustine Verl Dicker)  To help prevent nausea and vomiting after your treatment, we encourage you to take your nausea medication as directed by your MD.   If you develop nausea and vomiting that is not controlled by your nausea medication, call the clinic.   BELOW ARE SYMPTOMS THAT SHOULD BE REPORTED IMMEDIATELY:  *FEVER GREATER THAN 100.5 F  *CHILLS WITH OR WITHOUT FEVER  NAUSEA AND VOMITING THAT IS NOT CONTROLLED WITH YOUR NAUSEA MEDICATION  *UNUSUAL SHORTNESS OF BREATH  *UNUSUAL BRUISING OR BLEEDING  TENDERNESS IN MOUTH AND THROAT WITH OR WITHOUT PRESENCE OF ULCERS  *URINARY PROBLEMS  *BOWEL PROBLEMS  UNUSUAL RASH Items with * indicate a potential emergency and should be followed up as soon as possible.  Feel free to call the clinic should you have any questions or concerns. The clinic phone number is [phone].  Please show the [place] at check-in to the Emergency Department and triage nurse.

## 2020-05-21 ENCOUNTER — Telehealth: Payer: Self-pay | Admitting: Hematology

## 2020-05-21 ENCOUNTER — Inpatient Hospital Stay: Payer: Medicare Other

## 2020-05-21 VITALS — BP 127/52 | HR 66 | Temp 98.3°F | Resp 16

## 2020-05-21 DIAGNOSIS — Z7189 Other specified counseling: Secondary | ICD-10-CM

## 2020-05-21 DIAGNOSIS — Z5111 Encounter for antineoplastic chemotherapy: Secondary | ICD-10-CM | POA: Diagnosis not present

## 2020-05-21 DIAGNOSIS — C8310 Mantle cell lymphoma, unspecified site: Secondary | ICD-10-CM | POA: Diagnosis not present

## 2020-05-21 DIAGNOSIS — Z5189 Encounter for other specified aftercare: Secondary | ICD-10-CM | POA: Diagnosis not present

## 2020-05-21 DIAGNOSIS — Z79899 Other long term (current) drug therapy: Secondary | ICD-10-CM | POA: Diagnosis not present

## 2020-05-21 DIAGNOSIS — C8318 Mantle cell lymphoma, lymph nodes of multiple sites: Secondary | ICD-10-CM

## 2020-05-21 DIAGNOSIS — Z5112 Encounter for antineoplastic immunotherapy: Secondary | ICD-10-CM | POA: Diagnosis not present

## 2020-05-21 MED ORDER — DEXAMETHASONE SODIUM PHOSPHATE 100 MG/10ML IJ SOLN
10.0000 mg | Freq: Once | INTRAMUSCULAR | Status: AC
  Administered 2020-05-21: 10 mg via INTRAVENOUS
  Filled 2020-05-21: qty 10

## 2020-05-21 MED ORDER — SODIUM CHLORIDE 0.9% FLUSH
10.0000 mL | INTRAVENOUS | Status: DC | PRN
  Administered 2020-05-21: 10 mL
  Filled 2020-05-21: qty 10

## 2020-05-21 MED ORDER — HEPARIN SOD (PORK) LOCK FLUSH 100 UNIT/ML IV SOLN
500.0000 [IU] | Freq: Once | INTRAVENOUS | Status: AC | PRN
  Administered 2020-05-21: 500 [IU]
  Filled 2020-05-21: qty 5

## 2020-05-21 MED ORDER — SODIUM CHLORIDE 0.9 % IV SOLN
70.0000 mg/m2 | Freq: Once | INTRAVENOUS | Status: AC
  Administered 2020-05-21: 125 mg via INTRAVENOUS
  Filled 2020-05-21: qty 5

## 2020-05-21 MED ORDER — SODIUM CHLORIDE 0.9 % IV SOLN
Freq: Once | INTRAVENOUS | Status: AC
  Filled 2020-05-21: qty 250

## 2020-05-21 NOTE — Telephone Encounter (Signed)
Scheduled per 02/24 los, patient has been called and voicemail was left. 

## 2020-05-21 NOTE — Patient Instructions (Signed)
[facility] Discharge Instructions for Patients Receiving Chemotherapy  Today you received the following chemotherapy agent: Bendamustine Verl Dicker)  To help prevent nausea and vomiting after your treatment, we encourage you to take your nausea medication as directed by your MD.   If you develop nausea and vomiting that is not controlled by your nausea medication, call the clinic.   BELOW ARE SYMPTOMS THAT SHOULD BE REPORTED IMMEDIATELY:  *FEVER GREATER THAN 100.5 F  *CHILLS WITH OR WITHOUT FEVER  NAUSEA AND VOMITING THAT IS NOT CONTROLLED WITH YOUR NAUSEA MEDICATION  *UNUSUAL SHORTNESS OF BREATH  *UNUSUAL BRUISING OR BLEEDING  TENDERNESS IN MOUTH AND THROAT WITH OR WITHOUT PRESENCE OF ULCERS  *URINARY PROBLEMS  *BOWEL PROBLEMS  UNUSUAL RASH Items with * indicate a potential emergency and should be followed up as soon as possible.  Feel free to call the clinic should you have any questions or concerns. The clinic phone number is [phone].  Please show the [place] at check-in to the Emergency Department and triage nurse.

## 2020-05-22 ENCOUNTER — Other Ambulatory Visit: Payer: Self-pay

## 2020-05-22 ENCOUNTER — Inpatient Hospital Stay: Payer: Medicare Other

## 2020-05-22 VITALS — BP 169/56 | HR 71 | Temp 97.5°F | Resp 18

## 2020-05-22 DIAGNOSIS — C8318 Mantle cell lymphoma, lymph nodes of multiple sites: Secondary | ICD-10-CM

## 2020-05-22 DIAGNOSIS — Z79899 Other long term (current) drug therapy: Secondary | ICD-10-CM | POA: Diagnosis not present

## 2020-05-22 DIAGNOSIS — Z5112 Encounter for antineoplastic immunotherapy: Secondary | ICD-10-CM | POA: Diagnosis not present

## 2020-05-22 DIAGNOSIS — Z7189 Other specified counseling: Secondary | ICD-10-CM

## 2020-05-22 DIAGNOSIS — Z5189 Encounter for other specified aftercare: Secondary | ICD-10-CM | POA: Diagnosis not present

## 2020-05-22 DIAGNOSIS — Z5111 Encounter for antineoplastic chemotherapy: Secondary | ICD-10-CM | POA: Diagnosis not present

## 2020-05-22 DIAGNOSIS — C8310 Mantle cell lymphoma, unspecified site: Secondary | ICD-10-CM | POA: Diagnosis not present

## 2020-05-22 MED ORDER — PEGFILGRASTIM-CBQV 6 MG/0.6ML ~~LOC~~ SOSY
6.0000 mg | PREFILLED_SYRINGE | Freq: Once | SUBCUTANEOUS | Status: AC
  Administered 2020-05-22: 6 mg via SUBCUTANEOUS

## 2020-05-22 MED ORDER — PEGFILGRASTIM-CBQV 6 MG/0.6ML ~~LOC~~ SOSY
PREFILLED_SYRINGE | SUBCUTANEOUS | Status: AC
  Filled 2020-05-22: qty 0.6

## 2020-05-31 IMAGING — XA IR LEFT FLUORO GUIDE CV LINE
1 series · 1 of 1 positions shown · non-contrast
Comparison: none

INDICATION: 86-year-old male with a new diagnosis of diffuse large B-cell
lymphoma. He needs urgent chemotherapy and presents for port
catheter placement. Due to bulky right supraclavicular and cervical
chain adenopathy, his port catheter will be placed via a left
internal jugular venous approach.

[Series 300: ir imaging guided port insertion · 1 of 1 slices shown]
[im 1/1]
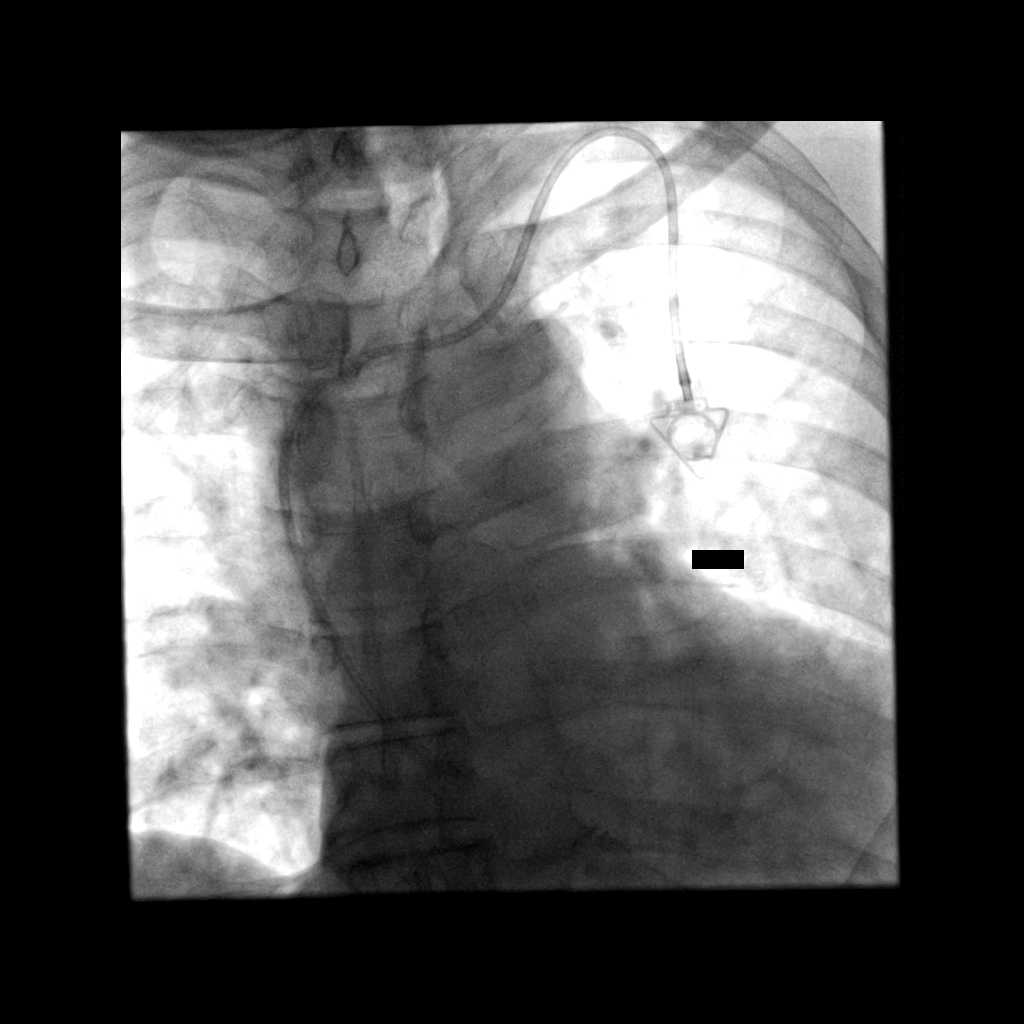

[1 of 1 positions shown; findings below may reference images not displayed]

EXAM:
IMPLANTED PORT A CATH PLACEMENT WITH ULTRASOUND AND FLUOROSCOPIC
GUIDANCE

MEDICATIONS:
900 mg Cleocin; The antibiotic was administered within an
appropriate time interval prior to skin puncture.

ANESTHESIA/SEDATION:
Versed 2 mg IV; Fentanyl 100 mcg IV;

Moderate Sedation Time:  28 minutes

The patient was continuously monitored during the procedure by the
interventional radiology nurse under my direct supervision.

FLUOROSCOPY TIME:  0 minutes, 48 seconds (5 mGy)

COMPLICATIONS:
None immediate.

PROCEDURE:
The left neck and chest was prepped with chlorhexidine, and draped
in the usual sterile fashion using maximum barrier technique (cap
and mask, sterile gown, sterile gloves, large sterile sheet, hand
hygiene and cutaneous antiseptic). Local anesthesia was attained by
infiltration with 1% lidocaine with epinephrine.

Ultrasound demonstrated patency of the left internal jugular vein,
and this was documented with an image. Under real-time ultrasound
guidance, this vein was accessed with a 21 gauge micropuncture
needle and image documentation was performed. A small dermatotomy
was made at the access site with an 11 scalpel. A 0.018" wire was
advanced into the SVC and the access needle exchanged for a 4F
micropuncture vascular sheath. The 0.018" wire was then removed and
a 0.035" wire advanced into the IVC.



The venous access site was then serially dilated and a peel away
vascular sheath placed over the wire. The wire was removed and the
port catheter advanced into position under fluoroscopic guidance.
The catheter tip is positioned in the superior cavoatrial junction.
This was documented with a spot image. The portacatheter was then
tested and found to flush and aspirate well. The port was flushed
with saline followed by 100 units/mL heparinized saline.

The pocket was then closed in two layers using first subdermal
inverted interrupted absorbable sutures followed by a running
subcuticular suture. The epidermis was then sealed with Dermabond.
The dermatotomy at the venous access site was also closed with
Dermabond.
IMPRESSION: Successful placement of a left IJ approach Power Port with
ultrasound and fluoroscopic guidance. The catheter is ready for use.

## 2020-06-07 DIAGNOSIS — L570 Actinic keratosis: Secondary | ICD-10-CM | POA: Diagnosis not present

## 2020-06-07 DIAGNOSIS — Z85828 Personal history of other malignant neoplasm of skin: Secondary | ICD-10-CM | POA: Diagnosis not present

## 2020-06-07 DIAGNOSIS — L814 Other melanin hyperpigmentation: Secondary | ICD-10-CM | POA: Diagnosis not present

## 2020-06-07 DIAGNOSIS — C44329 Squamous cell carcinoma of skin of other parts of face: Secondary | ICD-10-CM | POA: Diagnosis not present

## 2020-06-07 DIAGNOSIS — D485 Neoplasm of uncertain behavior of skin: Secondary | ICD-10-CM | POA: Diagnosis not present

## 2020-06-10 ENCOUNTER — Ambulatory Visit (HOSPITAL_COMMUNITY): Admission: RE | Admit: 2020-06-10 | Payer: Medicare Other | Source: Ambulatory Visit

## 2020-06-14 ENCOUNTER — Ambulatory Visit (HOSPITAL_COMMUNITY): Payer: Medicare Other

## 2020-06-15 ENCOUNTER — Other Ambulatory Visit: Payer: Self-pay

## 2020-06-15 ENCOUNTER — Ambulatory Visit (INDEPENDENT_AMBULATORY_CARE_PROVIDER_SITE_OTHER): Payer: Medicare Other | Admitting: Family Medicine

## 2020-06-15 ENCOUNTER — Encounter: Payer: Self-pay | Admitting: Family Medicine

## 2020-06-15 ENCOUNTER — Other Ambulatory Visit: Payer: Self-pay | Admitting: Family Medicine

## 2020-06-15 VITALS — BP 128/72 | HR 63 | Temp 98.1°F | Ht 66.5 in | Wt 159.8 lb

## 2020-06-15 DIAGNOSIS — C8318 Mantle cell lymphoma, lymph nodes of multiple sites: Secondary | ICD-10-CM | POA: Diagnosis not present

## 2020-06-15 DIAGNOSIS — E785 Hyperlipidemia, unspecified: Secondary | ICD-10-CM | POA: Diagnosis not present

## 2020-06-15 DIAGNOSIS — I700 Atherosclerosis of aorta: Secondary | ICD-10-CM | POA: Diagnosis not present

## 2020-06-15 NOTE — Progress Notes (Signed)
   Subjective:    Patient ID: Mario Garcia, male    DOB: 1932-01-16, 88 y.o.   MRN: [id number]  HPI He is here for an interval evaluation.  He is in the process of being treated for mantle cell lymphoma.  This was apparently found on the biopsy of one of his lesions.  Prior to that he has been treated for B-cell lymphoma.  He is scheduled for a PET scan in the next day or so.  He also recently saw dermatology for some lesions on his face and did have them biopsied.  Review of the record indicates that he is continuing on Pravachol for his underlying hyperlipidemia.  There is also history of atherosclerosis.   Review of Systems     Objective:   Physical Exam Alert and in no distress otherwise not examined       Assessment & Plan:  Hyperlipidemia with target LDL less than 100  Atherosclerosis of aorta (HCC)  Mantle cell lymphoma of lymph nodes of multiple regions ([place]) Lipid panel will be ordered. He is also in need of a Tdap.  He will follow up at the drugstore to get that taken care of.

## 2020-06-16 ENCOUNTER — Encounter: Payer: Self-pay | Admitting: Family Medicine

## 2020-06-16 ENCOUNTER — Ambulatory Visit
Admission: RE | Admit: 2020-06-16 | Discharge: 2020-06-16 | Disposition: A | Discharge status: Home / Self Care | Payer: Medicare Other | Source: Ambulatory Visit | Attending: Hematology | Admitting: Hematology

## 2020-06-16 DIAGNOSIS — R918 Other nonspecific abnormal finding of lung field: Secondary | ICD-10-CM | POA: Diagnosis not present

## 2020-06-16 DIAGNOSIS — C8318 Mantle cell lymphoma, lymph nodes of multiple sites: Secondary | ICD-10-CM | POA: Diagnosis not present

## 2020-06-16 DIAGNOSIS — Z5111 Encounter for antineoplastic chemotherapy: Secondary | ICD-10-CM

## 2020-06-16 DIAGNOSIS — I700 Atherosclerosis of aorta: Secondary | ICD-10-CM | POA: Diagnosis not present

## 2020-06-16 DIAGNOSIS — K4090 Unilateral inguinal hernia, without obstruction or gangrene, not specified as recurrent: Secondary | ICD-10-CM | POA: Insufficient documentation

## 2020-06-16 DIAGNOSIS — C8310 Mantle cell lymphoma, unspecified site: Secondary | ICD-10-CM | POA: Diagnosis not present

## 2020-06-16 LAB — LIPID PANEL
Chol/HDL Ratio: 4.1 ratio (ref 0.0–5.0)
Cholesterol, Total: 186 mg/dL (ref 100–199)
HDL: 45 mg/dL (ref >39)
LDL Chol Calc (NIH): 105 mg/dL — ABNORMAL HIGH (ref 0–99)
Triglycerides: 207 mg/dL — ABNORMAL HIGH (ref 0–149)
VLDL Cholesterol Cal: 36 mg/dL (ref 5–40)

## 2020-06-16 LAB — GLUCOSE, CAPILLARY: Glucose-Capillary: 100 mg/dL — ABNORMAL HIGH (ref 70–99)

## 2020-06-16 MED ORDER — FLUDEOXYGLUCOSE F - 18 (FDG) INJECTION
8.2000 | Freq: Once | INTRAVENOUS | Status: AC | PRN
  Administered 2020-06-16: 7.88 via INTRAVENOUS

## 2020-06-16 NOTE — Progress Notes (Signed)
HEMATOLOGY/ONCOLOGY CLINIC NOTE  Date of Service: 06/17/2020  Patient Care Team: Denita Lung, MD as PCP - General (Family Medicine)  CHIEF COMPLAINTS/PURPOSE OF CONSULTATION:  History of diffuse Large B-Cell Lymphoma Follow-up for continued management of mantle cell lymphoma  HISTORY OF PRESENTING ILLNESS:   Mario Garcia is a wonderful 87 y.o. male who has been referred to [place] by Dr. Jill Alexanders for evaluation and management of Diffuse Large B-Cell Lymphoma. The pt reports that he is doing well overall.   The pt reports that he feels that he has been a little more tired recently. He first noticed some "nodules" on his neck appear "4-6 weeks ago." He notes that these nodules haven't been painful. He appears to have presented to care with his PCP on 05/28/18, Dr. Redmond School. He notes that he has noticed that he has been sweating more in the last year. He denies noticing any other new lumps or bumps. He denies any fevers, chills, drenching night sweats, or unexpected weight loss. He notes that he has had some changes in swallowing over the last 6 months, which he characterizes as "getting strangled on his saliva every now and then." He denies abdominal pains, acid reflux, changes in bowel habits, leg swelling, skin rashes. He endorses a deep pain "every once in a while," in his left groin. He denies headaches. He endorses glaucoma and a history of cataracts. He sees Dr. Hetty Blend in ophthalmology. He denies any other concern in the last 6 months.  The pt notes that he lives independently with his wife and still is able to do all he wants to do. He walks on trails with his wife and notes that he can generally walk as far as he likes to.  The pt notes that he had prostate cancer in 2008 or 2009, s/p prostatectomy. He did not require RT nor systemic treatments. He denies lung, heart or kidney problems.  Of note prior to the patient's visit today, pt has had a CT Neck completed on 06/05/18  with results revealing Pathologic RIGHT-sided level II, III, IV, and V lymphadenopathy, most consistent with metastatic carcinoma. An obvious primary source is not identified. Tissue sampling is warranted.  Most recent lab results (07/08/18) of CBC and CMP is as follows: all values are WNL except for PLT at 129k.  On review of systems, pt reports slightly more tired, neck nodules, some changes in swallowing, staying active, and denies fevers, chills, drenching night sweats, unexpected weight loss, abdominal pains, changes in bowel habits, skin rashes, leg swelling, CP, SOB, difficulty breathing, headaches, other lumps or bumps, skin lesions, mouth sores, pain along the spine, back pain, leg swelling, and any other symptoms.   On PMHx the pt reports localized Prostate cancer in 2008 s/p prostatectomy.  On Social Hx the pt reports that he quit smoking over 50 years ago. He notes that he consumes about 1 glass of wine every day, without concerns for excessive drinking. 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 He denies concern for chemical or radiation exposure. On Family Hx the pt reports wife with Stage IV Non Hodgkin's Lymphoma, paternal grandmother with leukemia in her 57s, father with unspecified abdominal cancer.   INTERVAL HISTORY:   Mario Garcia returns today for management and evaluation of his mantle cell lymphoma and a toxicity check with C4D1 of BR. The patient's last visit with [place] was on 05/20/2020.The pt reports that he is doing well  overall. We are joined today by his wife.   The pt reports that he has continued to fatigue that keeps him from doing certain activities he would like to do. The pt went to the dermatologist and they noted the spot on his face is cancerous. He has a f/u on April 6th for Mohs Treatment of this Squamous Cell carcinoma spot. The pt notes he saw Dr. Jamse Belfast at [facility].  The pt has had PET Skull Base to Thigh on  06/16/2020, which revealed "1. Overall generalized mild decrease in size of the numerous hypermetabolic lymph nodes in the neck, chest, abdomen, and pelvis. Hypermetabolism in these lymph nodes also shows a generalized decreasing trend with index nodes today ranging from Deauville 2 to Deauville 4. 2. Focus of hypermetabolism adjacent to the gallbladder fossa in the anterior liver is similar to prior with no underlying abnormality evident on CT imaging. Finding remains indeterminate and continued attention on follow-up suggested. 3. Scattered tiny bilateral pulmonary nodules without hypermetabolic activity although these nodules are below the excepted size threshold for reliable resolution on PET imaging. 4. Inguinal hernia contains small bowel without complicating features. 5.  Aortic Atherosclerois (ICD10-170.0)"  Lab results today 06/17/2020 of CBC w/diff and CMP is as follows: all values are WNL except for RBC of 3.96, Hgb of 12.0, HCT of 36.2, Plt of 147K. CMP pending. 06/17/2020 LDH . Lab Results  Component Value Date   LDH 151 06/17/2020     On review of systems, pt reports fatigue, anxiety and denies decreased appetite, sudden weight loss,  and any other symptoms.  MEDICAL HISTORY:  Past Medical History:  Diagnosis Date  . Cancer ([place])    PROSTATE  . History of colonic polyps   . History of kidney stones   . Hypertension   . Inguinal hernia 03/2002  . Pneumonia    "years ago"    SURGICAL HISTORY: Past Surgical History:  Procedure Laterality Date  . COLONOSCOPY    . DIRECT LARYNGOSCOPY N/A 07/08/2018   Procedure: DIRECT LARYNGOSCOPY;  Surgeon: Leta Baptist, MD;  Location: [place];  Service: ENT;  Laterality: N/A;  . ESOPHAGOSCOPY N/A 07/08/2018   Procedure: ESOPHAGOSCOPY;  Surgeon: Leta Baptist, MD;  Location: [place];  Service: ENT;  Laterality: N/A;  . FLEXIBLE BRONCHOSCOPY N/A 07/08/2018   Procedure: FLEXIBLE BRONCHOSCOPY;  Surgeon: Leta Baptist, MD;  Location: [place];  Service: ENT;   Laterality: N/A;  . HERNIA REPAIR Left    inguinal hernia  . IR IMAGING GUIDED PORT INSERTION  07/30/2018  . MASS BIOPSY Right 07/08/2018   Procedure: RIGHT NECK MASS EXCISIONAL BIOPSY;  Surgeon: Leta Baptist, MD;  Location: [place];  Service: ENT;  Laterality: Right;  . PATELLA FRACTURE SURGERY Left   . PROSTATE SURGERY  10/2005   RADIAL PROSTATECTOMY  . ROTATOR CUFF REPAIR Bilateral     SOCIAL HISTORY: Social History   Socioeconomic History  . Marital status: Married    Spouse name: Not on file  . Number of children: Not on file  . Years of education: Not on file  . Highest education level: Not on file  Occupational History  . Not on file  Tobacco Use  . Smoking status: Former Research scientist (life sciences)  . Smokeless tobacco: Never Used  . Tobacco comment: stopped at the age of  18  Vaping Use  . Vaping Use: Never used  Substance and Sexual Activity  . Alcohol use: Yes    Alcohol/week: 6.0 standard drinks  Types: 6 Standard drinks or equivalent per week  . Drug use: No  . Sexual activity: Not Currently  Other Topics Concern  . Not on file  Social History Narrative  . Not on file   Social Determinants of Health   Financial Resource Strain: Not on file  Food Insecurity: Not on file  Transportation Needs: Not on file  Physical Activity: Not on file  Stress: Not on file  Social Connections: Not on file  Intimate Partner Violence: Not on file    FAMILY HISTORY: Family History  Problem Relation Age of Onset  . Cancer Father   . Cancer Brother     ALLERGIES:  is allergic to lisinopril and penicillins.  MEDICATIONS:  Current Outpatient Medications  Medication Sig Dispense Refill  . acyclovir (ZOVIRAX) 400 MG tablet Take 2 tablets (800 mg total) by mouth 2 (two) times daily. 120 tablet 3  . Carboxymethylcellulose Sodium (ARTIFICIAL TEARS OP) Place 1 drop into both eyes daily as needed (dry eyes).    Marland Kitchen dexamethasone (DECADRON) 4 MG tablet Take 2 tablets (8 mg total) by mouth daily. Start  the day after bendamustine chemotherapy for 2 days. Take with food. 30 tablet 1  . latanoprost (XALATAN) 0.005 % ophthalmic solution Place 1 drop into both eyes at bedtime.    . lidocaine-prilocaine (EMLA) cream Apply to affected area once 30 g 3  . LORazepam (ATIVAN) 0.5 MG tablet Take 1 tablet (0.5 mg total) by mouth every 6 (six) hours as needed (Nausea or vomiting). (Patient not taking: Reported on 06/15/2020) 30 tablet 0  . losartan-hydrochlorothiazide (HYZAAR) 50-12.5 MG tablet TAKE 1 TABLET BY MOUTH EVERY DAY 90 tablet 0  . ondansetron (ZOFRAN) 8 MG tablet Take 1 tablet (8 mg total) by mouth 2 (two) times daily as needed for refractory nausea / vomiting. Start on day 2 after bendamustine chemo. (Patient not taking: No sig reported) 30 tablet 1  . pravastatin (PRAVACHOL) 40 MG tablet Take 1 tablet by mouth once daily 90 tablet 0  . prochlorperazine (COMPAZINE) 10 MG tablet Take 1 tablet (10 mg total) by mouth every 6 (six) hours as needed (Nausea or vomiting). (Patient not taking: No sig reported) 30 tablet 1   No current facility-administered medications for this visit.    REVIEW OF SYSTEMS:   10 Point review of Systems was done is negative except as noted above.  PHYSICAL EXAMINATION: ECOG FS:1 - Symptomatic but completely ambulatory  Vitals:   06/17/20 1016  BP: 117/65  Pulse: 66  Resp: 18  Temp: (!) 97.3 F (36.3 C)  SpO2: 100%   Wt Readings from Last 3 Encounters:  06/17/20 162 lb 1.6 oz (73.5 kg)  06/15/20 159 lb 12.8 oz (72.5 kg)  05/20/20 161 lb 8 oz (73.3 kg)   Body mass index is 25.77 kg/m.    GENERAL:alert, in no acute distress and comfortable SKIN: no acute rashes, no significant lesions EYES: conjunctiva are pink and non-injected, sclera anicteric OROPHARYNX: MMM, no exudates, no oropharyngeal erythema or ulceration NECK: supple, no JVD LYMPH:  no palpable lymphadenopathy in the cervical, axillary or inguinal regions LUNGS: clear to auscultation b/l with  normal respiratory effort HEART: regular rate & rhythm ABDOMEN:  normoactive bowel sounds , non tender, not distended. Extremity: no pedal edema PSYCH: alert & oriented x 3 with fluent speech NEURO: no focal motor/sensory deficits   LABORATORY DATA:  I have reviewed the data as listed  . CBC Latest Ref Rng & Units 06/17/2020 05/20/2020 04/22/2020  WBC 4.0 - 10.5 K/uL 5.5 5.0 6.7  Hemoglobin 13.0 - 17.0 g/dL 12.0(L) 11.7(L) 12.2(L)  Hematocrit 39.0 - 52.0 % 36.2(L) 35.4(L) 37.4(L)  Platelets 150 - 400 K/uL 147(L) 130(L) 102(L)    . CMP Latest Ref Rng & Units 06/17/2020 05/20/2020 04/22/2020  Glucose 70 - 99 mg/dL 108(H) 109(H) 100(H)  BUN 8 - 23 mg/dL _0 Creatinine 0.61 - 1.24 mg/dL 0.82 0.86 0.85  Sodium 135 - 145 mmol/L 141 138 141  Potassium 3.5 - 5.1 mmol/L 3.5 3.7 3.6  Chloride 98 - 111 mmol/L 103 104 104  CO2 22 - 32 mmol/L _1 Calcium 8.9 - 10.3 mg/dL 8.5(L) 8.8(L) 9.0  Total Protein 6.5 - 8.1 g/dL 6.7 7.0 7.1  Total Bilirubin 0.3 - 1.2 mg/dL 0.4 0.5 0.5  Alkaline Phos 38 - 126 U/L 61 67 90  AST 15 - 41 U/L _2 ALT 0 - 44 U/L _3 . Lab Results  Component Value Date   LDH 151 06/17/2020   02/23/2020 Right Cervical Lymph Node Surgical Pathology [phone]):    02/23/2020 FISH Analysis:   02/23/2020 FISH Mutation Analysis:    03/03/2019 NM PET Image Restag (PS) Skull Base To Thigh (Accession [id number]):   07/08/18 Right Cervical Tissue Biopsy:    RADIOGRAPHIC STUDIES: I have personally reviewed the radiological images as listed and agreed with the findings in the report. NM PET Image Restag (PS) Skull Base To Thigh  Result Date: 06/16/2020 CLINICAL DATA:  Subsequent treatment strategy for mantle cell lymphoma. EXAM: NUCLEAR MEDICINE PET SKULL BASE TO THIGH TECHNIQUE: 7.9 mCi F-18 FDG was injected intravenously. Full-ring PET imaging was performed from the skull base to thigh after the radiotracer. CT data was obtained and used for  attenuation correction and anatomic localization. Fasting blood glucose: 100 mg/dl COMPARISON:  02/09/2020 FINDINGS: Mediastinal blood pool activity: SUV max 2.3 Liver activity: SUV max 3.2 NECK: Index right hypermetabolic level II node measured previously at 8 mm short axis is now 4 mm (40/3) SUV max = 3.0, decreased from 6.3. Similar decrease in other hypermetabolic cervical lymphadenopathy seen previously. Incidental CT findings: none CHEST: Interval decrease in hypermetabolism associated with axillary lymph nodes seen previously. Decreased hypermetabolism noted in the mediastinum, supraclavicular, and left internal mammary nodes. Index high left axillary node measured previously at 7 mm short axis is 6 mm short axis today with SUV max = 3.6, decreased from 7.3 previously. Index left supraclavicular node measured previously at 5 mm is 4 mm short axis today (58/3). SUV max = 2.6, decreased from 4.8. The pre-vascular lymph node identified is index lesion previously at 7 mm short axis measures 5 mm short axis today on image 90/series 3. SUV max = 1.7, decreased from 3.0. Incidental CT findings: Left Port-A-Cath tip is positioned at the SVC/RA junction. 5 mm right upper lobe nodule on 106/3 is stable. Tiny nodule in the right lower lobe on 113/3 is unchanged. Subpleural right lower lobe on 120/3 is stable. Similar tiny left lung nodules are stable. No substantial pleural effusion. ABDOMEN/PELVIS: No abnormal hypermetabolic activity within the pancreas, adrenal glands, or spleen. Spleen was diffusely hypermetabolic on the previous study with SUV max = 9.5, decreased in the interval to blood pool activity at SUV max = 2.4. Similar appearance of focal hypermetabolism in the inferior liver adjacent to the gallbladder fossa. No underlying liver lesion by CT imaging. Periportal nodal hypermetabolism as decreased from SUV max = 6.9  previously to 3.2 today. 6 mm short axis aortocaval index node measured previously is 7 mm  today (170/3) hypermetabolism in this lymph node is decreased with SUV max = 3.1 today compared to 7.4 previously. The index left external iliac node measured previously at 9 mm short axis is 7 mm short axis today with SUV max = 4.1, decreased from 8.4 previously. Previously identified hypermetabolic ileocolic mesentery nodes show similar interval decrease in FDG accumulation. Incidental CT findings: Gallbladder wall thickening at the fundus is likely secondary to adenomyomatosis. Similar appearance of haziness in the inferior small bowel mesentery. Right groin hernia contains small bowel loops without complicating features. There is abdominal aortic atherosclerosis without aneurysm. SKELETON: No focal hypermetabolic activity to suggest skeletal metastasis. Incidental CT findings: No worrisome lytic or sclerotic osseous abnormality. IMPRESSION: 1. Overall generalized mild decrease in size of the numerous hypermetabolic lymph nodes in the neck, chest, abdomen, and pelvis. Hypermetabolism in these lymph nodes also shows a generalized decreasing trend with index nodes today ranging from Deauville 2 to Deauville 4. 2. Focus of hypermetabolism adjacent to the gallbladder fossa in the anterior liver is similar to prior with no underlying abnormality evident on CT imaging. Finding remains indeterminate and continued attention on follow-up suggested. 3. Scattered tiny bilateral pulmonary nodules without hypermetabolic activity although these nodules are below the excepted size threshold for reliable resolution on PET imaging. 4. Inguinal hernia contains small bowel without complicating features. 5.  Aortic Atherosclerois (ICD10-170.0) Electronically Signed   By: Misty Stanley M.D.   On: 06/16/2020 16:25    ASSESSMENT & PLAN:   87 y.o. male with  1. H/o Diffuse Large B-Cell Lymphoma, Stage IV- now in remission Presenting without constitutional symptoms. Palpable right cervical and supraclavicular  lymphadenopathy.   07/08/18 Right cervical soft tissue biopsy revealed Diffuse Large B-Cell Lymphoma, germinal center type   06/05/18 CT Neck revealed Pathologic RIGHT-sided level II, III, IV, and V lymphadenopathy, most consistent with metastatic carcinoma. An obvious primary source is not identified. Tissue sampling is warranted.  07/16/18 Hep B and Hep C negative  07/17/18 ECHO revealed LV EF of 60-65%  07/22/18 PET/CT revealed "Hypermetabolic adenopathy especially concentrated in the right neck but also in the left neck, chest, abdomen, and pelvis. The adenopathy is primarily Deauville 5 although some few scattered smaller lesions are Deauville 4. 2. Diffuse abnormal splenic activity, Deauville 5, without overt splenomegaly. 3. There is also hypermetabolic Deauville 5 activity in the mildly thickened distal appendix, and raising suspicion for involvement of the appendix. 4. Other imaging findings of potential clinical significance: Aortic Atherosclerosis. Coronary atherosclerosis."  10/03/2018 PET scan revealed "Interval response to therapy with stable to decreased size of lymph nodes on CT and generalized decrease in hypermetabolism of the abnormal nodes. Hypermetabolism in the lymph nodes today is compatible with a combination of Deauville 3 and Deauville 4 disease. Stable tiny bilateral pulmonary nodules. Increase radiotracer accumulation in the marrow space on today's study, presumably representing marrow stimulatory effects of therapy."  2. Currently with new Non-Hodgkin's B-cell Lymphoma - consistent with Mantle Cell 02/23/2020 Right Cervical Lymph Node Surgical Pathology [phone]) which revealed "Non-Hodgkin B-cell lymphoma. Ki-67 generally shows low expression (<5-15%)." 02/23/2020 FISH Mutation Analysis which revealed "t(11;14): DETECTED".  PLAN: -Discussed pt labwork today, 06/17/2020; blood counts all improving, other labs pending. -Discussed PET 06/16/2020; all lymph nodes  shrunk and most activity decreased. Spleen decreased in size. Borderline activity. -Advised pt the current goal is to get as deep response as possible. Maximum of  six cycles.  -Advised pt that due to borderline activity, if he tolerates C4 and C5 well, we will continue with C6. -Advised pt that [place] [employer] [place] may go into remission, but has tendency to return and progress at some point. Will switch to maintenance treatment of just Rituxan q92month for 2 years after a repeat scan. -Advised pt that there may be some delays in healing of cancerous spot on face due to continuing chemotherapy. -Continue dosage of Acyclovir at 800 mg BID. Pt is aware to continue to monitor spots that look like Shingles. -Recommended pt avoid heavy lifting and overworking himself to not stress the hernia.  -Discussed Evusheld and pt's eligibility. Will send referral. -The pt has no prohibitive toxicities from continuing C4D1 Benamustine/Rituxan at this time. -Continue the same dosage of 740mm^2 Benamustine. Continue Udenyca growth factor support given patients age and previous chemotherapy. -Will see back in 4 weeks with labs.   FOLLOW UP: -Please schedule cycle 5 of bendamustine Rituxan chemotherapy with port flush labs and MD visit as per orders C5D1 will be 4/25 -Ambulatory referral to for consideration of Evusheld   The total time spent in the appointment was 30 minutes and more than 50% was on counseling and direct patient cares, ordering and management of chemotherapy.   All of the patient's questions were answered with apparent satisfaction. The patient knows to call the clinic with any problems, questions or concerns.   GaSullivan LoneD MSGrangerAHIVMS SCNorthern Light Inland HospitalTSt. Francis Hospitalematology/Oncology Physician CoSurg[facility](Office):       [phone] cell):  [phone]):           [phone] I, RoReinaldo Raddleam acting as scribe for Dr. GaSullivan LoneMD.    .I have reviewed the above  documentation for accuracy and completeness, and I agree with the above. .GBrunetta GeneraD

## 2020-06-17 ENCOUNTER — Other Ambulatory Visit: Payer: Self-pay

## 2020-06-17 ENCOUNTER — Inpatient Hospital Stay: Payer: Medicare Other

## 2020-06-17 ENCOUNTER — Inpatient Hospital Stay: Payer: Medicare Other | Attending: Hematology

## 2020-06-17 ENCOUNTER — Inpatient Hospital Stay (HOSPITAL_BASED_OUTPATIENT_CLINIC_OR_DEPARTMENT_OTHER): Payer: Medicare Other | Admitting: Hematology

## 2020-06-17 VITALS — BP 113/45 | HR 55 | Temp 98.0°F | Resp 16

## 2020-06-17 VITALS — BP 117/65 | HR 66 | Temp 97.3°F | Resp 18 | Ht 66.5 in | Wt 162.1 lb

## 2020-06-17 DIAGNOSIS — Z5111 Encounter for antineoplastic chemotherapy: Secondary | ICD-10-CM | POA: Diagnosis not present

## 2020-06-17 DIAGNOSIS — C8318 Mantle cell lymphoma, lymph nodes of multiple sites: Secondary | ICD-10-CM | POA: Insufficient documentation

## 2020-06-17 DIAGNOSIS — Z7189 Other specified counseling: Secondary | ICD-10-CM

## 2020-06-17 DIAGNOSIS — Z9221 Personal history of antineoplastic chemotherapy: Secondary | ICD-10-CM | POA: Insufficient documentation

## 2020-06-17 DIAGNOSIS — Z5189 Encounter for other specified aftercare: Secondary | ICD-10-CM | POA: Diagnosis not present

## 2020-06-17 DIAGNOSIS — Z5112 Encounter for antineoplastic immunotherapy: Secondary | ICD-10-CM | POA: Insufficient documentation

## 2020-06-17 DIAGNOSIS — Z298 Encounter for other specified prophylactic measures: Secondary | ICD-10-CM | POA: Diagnosis not present

## 2020-06-17 DIAGNOSIS — Z79899 Other long term (current) drug therapy: Secondary | ICD-10-CM | POA: Insufficient documentation

## 2020-06-17 DIAGNOSIS — Z95828 Presence of other vascular implants and grafts: Secondary | ICD-10-CM

## 2020-06-17 LAB — CBC WITH DIFFERENTIAL/PLATELET
Abs Immature Granulocytes: 0.02 10*3/uL (ref 0.00–0.07)
Basophils Absolute: 0 10*3/uL (ref 0.0–0.1)
Basophils Relative: 0 %
Eosinophils Absolute: 0.1 10*3/uL (ref 0.0–0.5)
Eosinophils Relative: 1 %
HCT: 36.2 % — ABNORMAL LOW (ref 39.0–52.0)
Hemoglobin: 12 g/dL — ABNORMAL LOW (ref 13.0–17.0)
Immature Granulocytes: 0 %
Lymphocytes Relative: 27 %
Lymphs Abs: 1.5 10*3/uL (ref 0.7–4.0)
MCH: 30.3 pg (ref 26.0–34.0)
MCHC: 33.1 g/dL (ref 30.0–36.0)
MCV: 91.4 fL (ref 80.0–100.0)
Monocytes Absolute: 0.7 10*3/uL (ref 0.1–1.0)
Monocytes Relative: 12 %
Neutro Abs: 3.3 10*3/uL (ref 1.7–7.7)
Neutrophils Relative %: 60 %
Platelets: 147 10*3/uL — ABNORMAL LOW (ref 150–400)
RBC: 3.96 MIL/uL — ABNORMAL LOW (ref 4.22–5.81)
RDW: 14.6 % (ref 11.5–15.5)
WBC: 5.5 10*3/uL (ref 4.0–10.5)
nRBC: 0 % (ref 0.0–0.2)

## 2020-06-17 LAB — CMP (CANCER CENTER ONLY)
ALT: 14 U/L (ref 0–44)
AST: 16 U/L (ref 15–41)
Albumin: 3.9 g/dL (ref 3.5–5.0)
Alkaline Phosphatase: 61 U/L (ref 38–126)
Anion gap: 10 (ref 5–15)
BUN: 13 mg/dL (ref 8–23)
CO2: 28 mmol/L (ref 22–32)
Calcium: 8.5 mg/dL — ABNORMAL LOW (ref 8.9–10.3)
Chloride: 103 mmol/L (ref 98–111)
Creatinine: 0.82 mg/dL (ref 0.61–1.24)
GFR, Estimated: >60 mL/min (ref >60)
Glucose, Bld: 108 mg/dL — ABNORMAL HIGH (ref 70–99)
Potassium: 3.5 mmol/L (ref 3.5–5.1)
Sodium: 141 mmol/L (ref 135–145)
Total Bilirubin: 0.4 mg/dL (ref 0.3–1.2)
Total Protein: 6.7 g/dL (ref 6.5–8.1)

## 2020-06-17 LAB — LACTATE DEHYDROGENASE: LDH: 151 U/L (ref 98–192)

## 2020-06-17 MED ORDER — DIPHENHYDRAMINE HCL 25 MG PO CAPS
50.0000 mg | ORAL_CAPSULE | Freq: Once | ORAL | Status: AC
  Administered 2020-06-17: 50 mg via ORAL

## 2020-06-17 MED ORDER — SODIUM CHLORIDE 0.9 % IV SOLN
375.0000 mg/m2 | Freq: Once | INTRAVENOUS | Status: AC
  Administered 2020-06-17: 700 mg via INTRAVENOUS
  Filled 2020-06-17: qty 50

## 2020-06-17 MED ORDER — SODIUM CHLORIDE 0.9 % IV SOLN
10.0000 mg | Freq: Once | INTRAVENOUS | Status: AC
  Administered 2020-06-17: 10 mg via INTRAVENOUS
  Filled 2020-06-17: qty 10

## 2020-06-17 MED ORDER — ACETAMINOPHEN 325 MG PO TABS
650.0000 mg | ORAL_TABLET | Freq: Once | ORAL | Status: AC
  Administered 2020-06-17: 650 mg via ORAL

## 2020-06-17 MED ORDER — SODIUM CHLORIDE 0.9 % IV SOLN
Freq: Once | INTRAVENOUS | Status: AC
  Filled 2020-06-17: qty 250

## 2020-06-17 MED ORDER — ACETAMINOPHEN 325 MG PO TABS
ORAL_TABLET | ORAL | Status: AC
  Filled 2020-06-17: qty 2

## 2020-06-17 MED ORDER — DIPHENHYDRAMINE HCL 25 MG PO CAPS
ORAL_CAPSULE | ORAL | Status: AC
  Filled 2020-06-17: qty 2

## 2020-06-17 MED ORDER — PALONOSETRON HCL INJECTION 0.25 MG/5ML
0.2500 mg | Freq: Once | INTRAVENOUS | Status: AC
  Administered 2020-06-17: 0.25 mg via INTRAVENOUS

## 2020-06-17 MED ORDER — SODIUM CHLORIDE 0.9% FLUSH
10.0000 mL | INTRAVENOUS | Status: DC | PRN
  Administered 2020-06-17: 10 mL
  Filled 2020-06-17: qty 10

## 2020-06-17 MED ORDER — HEPARIN SOD (PORK) LOCK FLUSH 100 UNIT/ML IV SOLN
500.0000 [IU] | Freq: Once | INTRAVENOUS | Status: AC | PRN
  Administered 2020-06-17: 500 [IU]
  Filled 2020-06-17: qty 5

## 2020-06-17 MED ORDER — PALONOSETRON HCL INJECTION 0.25 MG/5ML
INTRAVENOUS | Status: AC
  Filled 2020-06-17: qty 5

## 2020-06-17 MED ORDER — SODIUM CHLORIDE 0.9 % IV SOLN
70.0000 mg/m2 | Freq: Once | INTRAVENOUS | Status: AC
  Administered 2020-06-17: 125 mg via INTRAVENOUS
  Filled 2020-06-17: qty 5

## 2020-06-17 NOTE — Patient Instructions (Signed)
[place] Discharge Instructions for Patients Receiving Chemotherapy  Today you received the following chemotherapy agents: rituximab and bendamustine.  To help prevent nausea and vomiting after your treatment, we encourage you to take your nausea medication as directed.   If you develop nausea and vomiting that is not controlled by your nausea medication, call the clinic.   BELOW ARE SYMPTOMS THAT SHOULD BE REPORTED IMMEDIATELY:  *FEVER GREATER THAN 100.5 F  *CHILLS WITH OR WITHOUT FEVER  NAUSEA AND VOMITING THAT IS NOT CONTROLLED WITH YOUR NAUSEA MEDICATION  *UNUSUAL SHORTNESS OF BREATH  *UNUSUAL BRUISING OR BLEEDING  TENDERNESS IN MOUTH AND THROAT WITH OR WITHOUT PRESENCE OF ULCERS  *URINARY PROBLEMS  *BOWEL PROBLEMS  UNUSUAL RASH Items with * indicate a potential emergency and should be followed up as soon as possible.  Feel free to call the clinic should you have any questions or concerns. The clinic phone number is [phone].  Please show the [place] at check-in to the Emergency Department and triage nurse.

## 2020-06-18 ENCOUNTER — Other Ambulatory Visit: Payer: Self-pay | Admitting: Physician Assistant

## 2020-06-18 ENCOUNTER — Inpatient Hospital Stay: Payer: Medicare Other

## 2020-06-18 VITALS — BP 140/52 | HR 62 | Temp 98.1°F | Resp 17 | Wt 162.1 lb

## 2020-06-18 DIAGNOSIS — Z5111 Encounter for antineoplastic chemotherapy: Secondary | ICD-10-CM | POA: Diagnosis not present

## 2020-06-18 DIAGNOSIS — Z298 Encounter for other specified prophylactic measures: Secondary | ICD-10-CM | POA: Diagnosis not present

## 2020-06-18 DIAGNOSIS — Z5112 Encounter for antineoplastic immunotherapy: Secondary | ICD-10-CM | POA: Diagnosis not present

## 2020-06-18 DIAGNOSIS — Z7189 Other specified counseling: Secondary | ICD-10-CM

## 2020-06-18 DIAGNOSIS — C8318 Mantle cell lymphoma, lymph nodes of multiple sites: Secondary | ICD-10-CM | POA: Diagnosis not present

## 2020-06-18 DIAGNOSIS — Z5189 Encounter for other specified aftercare: Secondary | ICD-10-CM | POA: Diagnosis not present

## 2020-06-18 DIAGNOSIS — C8331 Diffuse large B-cell lymphoma, lymph nodes of head, face, and neck: Secondary | ICD-10-CM

## 2020-06-18 DIAGNOSIS — Z9221 Personal history of antineoplastic chemotherapy: Secondary | ICD-10-CM | POA: Diagnosis not present

## 2020-06-18 MED ORDER — SODIUM CHLORIDE 0.9 % IV SOLN
70.0000 mg/m2 | Freq: Once | INTRAVENOUS | Status: AC
  Administered 2020-06-18: 125 mg via INTRAVENOUS
  Filled 2020-06-18: qty 5

## 2020-06-18 MED ORDER — SODIUM CHLORIDE 0.9 % IV SOLN
10.0000 mg | Freq: Once | INTRAVENOUS | Status: AC
  Administered 2020-06-18: 10 mg via INTRAVENOUS
  Filled 2020-06-18: qty 10

## 2020-06-18 MED ORDER — SODIUM CHLORIDE 0.9 % IV SOLN
Freq: Once | INTRAVENOUS | Status: AC
  Filled 2020-06-18: qty 250

## 2020-06-18 NOTE — Progress Notes (Signed)
I connected by phone with Mario Garcia on 06/18/2020, 3:01 PM to discuss the potential use of a new treatment, tixagevimab/cilgavimab, for pre-exposure prophylaxis for prevention of coronavirus disease 2019 (COVID-19) caused by the SARS-CoV-2 virus.  This patient is a 87 y.o. male that meets the FDA criteria for Emergency Use Authorization of tixagevimab/cilgavimab for pre-exposure prophylaxis of COVID-19 disease. Pt meets following criteria:  Age >12 yr and weight > 40kg  Not currently infected with SARS-CoV-2 and has no known recent exposure to an individual infected with SARS-CoV-2 AND o Who has moderate to severe immune compromise due to a medical condition or receipt of immunosuppressive medications or treatments and may not mount an adequate immune response to COVID-19 vaccination or  o Vaccination with any available COVID-19 vaccine, according to the approved or authorized schedule, is not recommended due to a history of severe adverse reaction (e.g., severe allergic reaction) to a COVID-19 vaccine(s) and/or COVID-19 vaccine component(s).  o Patient meets the following definition of mod-severe immune compromised status: 6. Other actively treated hematologic malignancies or severe congenital immunodeficiency syndromes  I have spoken and communicated the following to the patient or parent/caregiver regarding COVID monoclonal antibody treatment:  1. FDA has authorized the emergency use of tixagevimab/cilgavimab for the pre-exposure prophylaxis of COVID-19 in patients with moderate-severe immunocompromised status, who meet above EUA criteria.  2. The significant known and potential risks and benefits of COVID monoclonal antibody, and the extent to which such potential risks and benefits are unknown.  3. Information on available alternative treatments and the risks and benefits of those alternatives, including clinical trials.  4. The patient or parent/caregiver has the option to accept or  refuse COVID monoclonal antibody treatment.  After reviewing this information with the patient, agree to receive tixagevimab/cilgavimab   He is coming in next Monday 3/28 for an injection. I have sent a note to the cancer center to schedule him for injections at 7:30am.  Angelena Form, PA-C, 06/18/2020, 3:01 PM

## 2020-06-18 NOTE — Patient Instructions (Signed)
[facility] Discharge Instructions for Patients Receiving Chemotherapy  Today you received the following chemotherapy agents: bendamustine.  To help prevent nausea and vomiting after your treatment, we encourage you to take your nausea medication as directed.   If you develop nausea and vomiting that is not controlled by your nausea medication, call the clinic.   BELOW ARE SYMPTOMS THAT SHOULD BE REPORTED IMMEDIATELY:  *FEVER GREATER THAN 100.5 F  *CHILLS WITH OR WITHOUT FEVER  NAUSEA AND VOMITING THAT IS NOT CONTROLLED WITH YOUR NAUSEA MEDICATION  *UNUSUAL SHORTNESS OF BREATH  *UNUSUAL BRUISING OR BLEEDING  TENDERNESS IN MOUTH AND THROAT WITH OR WITHOUT PRESENCE OF ULCERS  *URINARY PROBLEMS  *BOWEL PROBLEMS  UNUSUAL RASH Items with * indicate a potential emergency and should be followed up as soon as possible.  Feel free to call the clinic should you have any questions or concerns. The clinic phone number is [phone].  Please show the [place] at check-in to the Emergency Department and triage nurse.

## 2020-06-19 ENCOUNTER — Ambulatory Visit: Payer: Medicare Other

## 2020-06-21 ENCOUNTER — Other Ambulatory Visit: Payer: Self-pay

## 2020-06-21 ENCOUNTER — Inpatient Hospital Stay: Payer: Medicare Other

## 2020-06-21 ENCOUNTER — Telehealth: Payer: Self-pay | Admitting: Hematology

## 2020-06-21 VITALS — BP 132/57 | HR 58 | Temp 98.0°F | Resp 16

## 2020-06-21 DIAGNOSIS — Z5112 Encounter for antineoplastic immunotherapy: Secondary | ICD-10-CM | POA: Diagnosis not present

## 2020-06-21 DIAGNOSIS — C8318 Mantle cell lymphoma, lymph nodes of multiple sites: Secondary | ICD-10-CM

## 2020-06-21 DIAGNOSIS — Z5111 Encounter for antineoplastic chemotherapy: Secondary | ICD-10-CM | POA: Diagnosis not present

## 2020-06-21 DIAGNOSIS — Z9221 Personal history of antineoplastic chemotherapy: Secondary | ICD-10-CM | POA: Diagnosis not present

## 2020-06-21 DIAGNOSIS — C8331 Diffuse large B-cell lymphoma, lymph nodes of head, face, and neck: Secondary | ICD-10-CM

## 2020-06-21 DIAGNOSIS — Z5189 Encounter for other specified aftercare: Secondary | ICD-10-CM | POA: Diagnosis not present

## 2020-06-21 DIAGNOSIS — Z79899 Other long term (current) drug therapy: Secondary | ICD-10-CM | POA: Diagnosis not present

## 2020-06-21 DIAGNOSIS — Z298 Encounter for other specified prophylactic measures: Secondary | ICD-10-CM | POA: Diagnosis not present

## 2020-06-21 DIAGNOSIS — Z7189 Other specified counseling: Secondary | ICD-10-CM

## 2020-06-21 MED ORDER — TIXAGEVIMAB (PART OF EVUSHELD) INJECTION
300.0000 mg | Freq: Once | INTRAMUSCULAR | Status: AC
  Administered 2020-06-21: 300 mg via INTRAMUSCULAR
  Filled 2020-06-21: qty 3

## 2020-06-21 MED ORDER — PEGFILGRASTIM-CBQV 6 MG/0.6ML ~~LOC~~ SOSY
6.0000 mg | PREFILLED_SYRINGE | Freq: Once | SUBCUTANEOUS | Status: AC
  Administered 2020-06-21: 6 mg via SUBCUTANEOUS

## 2020-06-21 MED ORDER — CILGAVIMAB (PART OF EVUSHELD) INJECTION
300.0000 mg | Freq: Once | INTRAMUSCULAR | Status: AC
  Administered 2020-06-21: 300 mg via INTRAMUSCULAR
  Filled 2020-06-21: qty 3

## 2020-06-21 MED ORDER — PEGFILGRASTIM-CBQV 6 MG/0.6ML ~~LOC~~ SOSY
PREFILLED_SYRINGE | SUBCUTANEOUS | Status: AC
  Filled 2020-06-21: qty 0.6

## 2020-06-21 NOTE — Progress Notes (Signed)
1 hour post-Evusheld observation completed with no adverse effects.

## 2020-06-21 NOTE — Patient Instructions (Signed)
Pegfilgrastim injection What is this medicine? PEGFILGRASTIM (PEG fil gra stim) is a long-acting granulocyte colony-stimulating factor that stimulates the growth of neutrophils, a type of white blood cell important in the body's fight against infection. It is used to reduce the incidence of fever and infection in patients with certain types of cancer who are receiving chemotherapy that affects the bone marrow, and to increase survival after being exposed to high doses of radiation. This medicine may be used for other purposes; ask your health care provider or pharmacist if you have questions. COMMON BRAND NAME(S): Fulphila, Neulasta, Nyvepria, UDENYCA, Ziextenzo What should I tell my health care provider before I take this medicine? They need to know if you have any of these conditions:  kidney disease  latex allergy  ongoing radiation therapy  sickle cell disease  skin reactions to acrylic adhesives (On-Body Injector only)  an unusual or allergic reaction to pegfilgrastim, filgrastim, other medicines, foods, dyes, or preservatives  pregnant or trying to get pregnant  breast-feeding How should I use this medicine? This medicine is for injection under the skin. If you get this medicine at home, you will be taught how to prepare and give the pre-filled syringe or how to use the On-body Injector. Refer to the patient Instructions for Use for detailed instructions. Use exactly as directed. Tell your healthcare provider immediately if you suspect that the On-body Injector may not have performed as intended or if you suspect the use of the On-body Injector resulted in a missed or partial dose. It is important that you put your used needles and syringes in a special sharps container. Do not put them in a trash can. If you do not have a sharps container, call your pharmacist or healthcare provider to get one. Talk to your pediatrician regarding the use of this medicine in children. While this drug  may be prescribed for selected conditions, precautions do apply. Overdosage: If you think you have taken too much of this medicine contact a poison control center or emergency room at once. NOTE: This medicine is only for you. Do not share this medicine with others. What if I miss a dose? It is important not to miss your dose. Call your doctor or health care professional if you miss your dose. If you miss a dose due to an On-body Injector failure or leakage, a new dose should be administered as soon as possible using a single prefilled syringe for manual use. What may interact with this medicine? Interactions have not been studied. This list may not describe all possible interactions. Give your health care provider a list of all the medicines, herbs, non-prescription drugs, or dietary supplements you use. Also tell them if you smoke, drink alcohol, or use illegal drugs. Some items may interact with your medicine. What should I watch for while using this medicine? Your condition will be monitored carefully while you are receiving this medicine. You may need blood work done while you are taking this medicine. Talk to your health care provider about your risk of cancer. You may be more at risk for certain types of cancer if you take this medicine. If you are going to need a MRI, CT scan, or other procedure, tell your doctor that you are using this medicine (On-Body Injector only). What side effects may I notice from receiving this medicine? Side effects that you should report to your doctor or health care professional as soon as possible:  allergic reactions (skin rash, itching or hives, swelling of   the face, lips, or tongue)  back pain  dizziness  fever  pain, redness, or irritation at site where injected  pinpoint red spots on the skin  red or dark-brown urine  shortness of breath or breathing problems  stomach or side pain, or pain at the shoulder  swelling  tiredness  trouble  passing urine or change in the amount of urine  unusual bruising or bleeding Side effects that usually do not require medical attention (report to your doctor or health care professional if they continue or are bothersome):  bone pain  muscle pain This list may not describe all possible side effects. Call your doctor for medical advice about side effects. You may report side effects to FDA at 1-800-FDA-1088. Where should I keep my medicine? Keep out of the reach of children. If you are using this medicine at home, you will be instructed on how to store it. Throw away any unused medicine after the expiration date on the label. NOTE: This sheet is a summary. It may not cover all possible information. If you have questions about this medicine, talk to your doctor, pharmacist, or health care provider.  2021 Elsevier/Gold Standard (2019-04-04 13:20:51)  

## 2020-06-21 NOTE — Telephone Encounter (Signed)
Scheduled follow-up appointments per 3/24 los. Patient is aware.

## 2020-06-30 DIAGNOSIS — Z85828 Personal history of other malignant neoplasm of skin: Secondary | ICD-10-CM | POA: Diagnosis not present

## 2020-06-30 DIAGNOSIS — C44329 Squamous cell carcinoma of skin of other parts of face: Secondary | ICD-10-CM | POA: Diagnosis not present

## 2020-07-09 ENCOUNTER — Telehealth: Payer: Self-pay

## 2020-07-09 ENCOUNTER — Emergency Department (HOSPITAL_COMMUNITY)
Admission: EM | Admit: 2020-07-09 | Discharge: 2020-07-09 | Disposition: A | Discharge status: Home / Self Care | Payer: Medicare Other | Attending: Emergency Medicine | Admitting: Emergency Medicine

## 2020-07-09 ENCOUNTER — Other Ambulatory Visit: Payer: Self-pay

## 2020-07-09 DIAGNOSIS — K4090 Unilateral inguinal hernia, without obstruction or gangrene, not specified as recurrent: Secondary | ICD-10-CM | POA: Diagnosis not present

## 2020-07-09 DIAGNOSIS — Z87891 Personal history of nicotine dependence: Secondary | ICD-10-CM | POA: Insufficient documentation

## 2020-07-09 DIAGNOSIS — Z79899 Other long term (current) drug therapy: Secondary | ICD-10-CM | POA: Diagnosis not present

## 2020-07-09 DIAGNOSIS — Z8546 Personal history of malignant neoplasm of prostate: Secondary | ICD-10-CM | POA: Diagnosis not present

## 2020-07-09 DIAGNOSIS — Z7982 Long term (current) use of aspirin: Secondary | ICD-10-CM | POA: Diagnosis not present

## 2020-07-09 DIAGNOSIS — I10 Essential (primary) hypertension: Secondary | ICD-10-CM | POA: Insufficient documentation

## 2020-07-09 DIAGNOSIS — R531 Weakness: Secondary | ICD-10-CM | POA: Diagnosis not present

## 2020-07-09 LAB — COMPREHENSIVE METABOLIC PANEL
ALT: 14 U/L (ref 0–44)
AST: 18 U/L (ref 15–41)
Albumin: 4 g/dL (ref 3.5–5.0)
Alkaline Phosphatase: 71 U/L (ref 38–126)
Anion gap: 10 (ref 5–15)
BUN: 11 mg/dL (ref 8–23)
CO2: 28 mmol/L (ref 22–32)
Calcium: 8.9 mg/dL (ref 8.9–10.3)
Chloride: 102 mmol/L (ref 98–111)
Creatinine, Ser: 0.78 mg/dL (ref 0.61–1.24)
GFR, Estimated: >60 mL/min (ref >60)
Glucose, Bld: 103 mg/dL — ABNORMAL HIGH (ref 70–99)
Potassium: 3.7 mmol/L (ref 3.5–5.1)
Sodium: 140 mmol/L (ref 135–145)
Total Bilirubin: 1.1 mg/dL (ref 0.3–1.2)
Total Protein: 6.6 g/dL (ref 6.5–8.1)

## 2020-07-09 LAB — CBC WITH DIFFERENTIAL/PLATELET
Abs Immature Granulocytes: 0.06 10*3/uL (ref 0.00–0.07)
Basophils Absolute: 0 10*3/uL (ref 0.0–0.1)
Basophils Relative: 0 %
Eosinophils Absolute: 0 10*3/uL (ref 0.0–0.5)
Eosinophils Relative: 0 %
HCT: 37.8 % — ABNORMAL LOW (ref 39.0–52.0)
Hemoglobin: 12.7 g/dL — ABNORMAL LOW (ref 13.0–17.0)
Immature Granulocytes: 0 %
Lymphocytes Relative: 4 %
Lymphs Abs: 0.5 10*3/uL — ABNORMAL LOW (ref 0.7–4.0)
MCH: 31.1 pg (ref 26.0–34.0)
MCHC: 33.6 g/dL (ref 30.0–36.0)
MCV: 92.6 fL (ref 80.0–100.0)
Monocytes Absolute: 1.1 10*3/uL — ABNORMAL HIGH (ref 0.1–1.0)
Monocytes Relative: 7 %
Neutro Abs: 12.8 10*3/uL — ABNORMAL HIGH (ref 1.7–7.7)
Neutrophils Relative %: 89 %
Platelets: 167 10*3/uL (ref 150–400)
RBC: 4.08 MIL/uL — ABNORMAL LOW (ref 4.22–5.81)
RDW: 14.1 % (ref 11.5–15.5)
WBC: 14.4 10*3/uL — ABNORMAL HIGH (ref 4.0–10.5)
nRBC: 0 % (ref 0.0–0.2)

## 2020-07-09 MED ORDER — ONDANSETRON HCL 4 MG/2ML IJ SOLN
4.0000 mg | Freq: Once | INTRAMUSCULAR | Status: AC
  Administered 2020-07-09: 4 mg via INTRAVENOUS
  Filled 2020-07-09: qty 2

## 2020-07-09 MED ORDER — MORPHINE SULFATE (PF) 4 MG/ML IV SOLN
4.0000 mg | Freq: Once | INTRAVENOUS | Status: AC
  Administered 2020-07-09: 4 mg via INTRAVENOUS
  Filled 2020-07-09: qty 1

## 2020-07-09 MED ORDER — HEPARIN SOD (PORK) LOCK FLUSH 100 UNIT/ML IV SOLN
500.0000 [IU] | Freq: Once | INTRAVENOUS | Status: AC
  Administered 2020-07-09: 500 [IU]
  Filled 2020-07-09: qty 5

## 2020-07-09 NOTE — ED Notes (Signed)
Pt also states he tripped and hit left side of his face under his eye about a week ago. Denies LOC or changes in vision or blood thinners. Pt was not seen by physician for this fall

## 2020-07-09 NOTE — ED Provider Notes (Signed)
[facility] DEPT Provider Note   CSN: [id number] Arrival date & time: 07/09/20  1241     History Chief Complaint  Patient presents with  . Inguinal Hernia    Mario Garcia is a 87 y.o. male.  The history is provided by the patient and medical records.   Mario Garcia is a 87 y.o. male who presents to the Emergency Department complaining of hernia. He presents the emergency department complaining of right inguinal hernia pain that started this morning. He has a history of hernia that is normally soft, nontender and easy to reduce. Today after waking up the hernia became firm and filled his scrotum and started to feel uncomfortable. He has been attempting to reduce at home with no success. He has associated nausea. Denies any fevers, vomiting. He has mild associated abdominal discomfort as well. He is currently undergoing treatment for mantle cell lymphoma. He does not take any blood thinners.    Past Medical History:  Diagnosis Date  . Cancer ([place])    PROSTATE  . History of colonic polyps   . History of kidney stones   . Hypertension   . Inguinal hernia 03/2002  . Pneumonia    "years ago"    Patient Active Problem List   Diagnosis Date Noted  . Atherosclerosis of aorta ([place]) 03/16/2020  . Mantle cell lymphoma ([place]) 03/16/2020  . Counseling regarding advance care planning and goals of care 03/16/2020  . Port-A-Cath in place 09/16/2018  . Diffuse large B-cell lymphoma of lymph nodes of neck ([place]) 07/26/2018  . History of renal stone 08/24/2016  . Hypertension 12/21/2010  . Hyperlipidemia with target LDL less than 100 12/21/2010  . History of prostate cancer 12/21/2010    Past Surgical History:  Procedure Laterality Date  . COLONOSCOPY    . DIRECT LARYNGOSCOPY N/A 07/08/2018   Procedure: DIRECT LARYNGOSCOPY;  Surgeon: Leta Baptist, MD;  Location: [facility];  Service: ENT;  Laterality: N/A;  . ESOPHAGOSCOPY N/A 07/08/2018   Procedure: ESOPHAGOSCOPY;   Surgeon: Leta Baptist, MD;  Location: [facility];  Service: ENT;  Laterality: N/A;  . FLEXIBLE BRONCHOSCOPY N/A 07/08/2018   Procedure: FLEXIBLE BRONCHOSCOPY;  Surgeon: Leta Baptist, MD;  Location: [facility];  Service: ENT;  Laterality: N/A;  . HERNIA REPAIR Left    inguinal hernia  . IR IMAGING GUIDED PORT INSERTION  07/30/2018  . MASS BIOPSY Right 07/08/2018   Procedure: RIGHT NECK MASS EXCISIONAL BIOPSY;  Surgeon: Leta Baptist, MD;  Location: [facility];  Service: ENT;  Laterality: Right;  . PATELLA FRACTURE SURGERY Left   . PROSTATE SURGERY  10/2005   RADIAL PROSTATECTOMY  . ROTATOR CUFF REPAIR Bilateral        Family History  Problem Relation Age of Onset  . Cancer Father   . Cancer Brother     Social History   Tobacco Use  . Smoking status: Former Research scientist (life sciences)  . Smokeless tobacco: Never Used  . Tobacco comment: stopped at the age of  20  Vaping Use  . Vaping Use: Never used  Substance Use Topics  . Alcohol use: Yes    Alcohol/week: 6.0 standard drinks    Types: 6 Standard drinks or equivalent per week  . Drug use: No    Home Medications Prior to Admission medications   Medication Sig Start Date End Date Taking? Authorizing Provider  acyclovir (ZOVIRAX) 400 MG tablet Take 2 tablets (800 mg total) by mouth 2 (two) times daily. 05/20/20 07/19/20 Yes Ripon,  Cloria Spring, MD  aspirin-sod bicarb-citric acid (ALKA-SELTZER) 325 MG TBEF tablet Take 650 mg by mouth every 6 (six) hours as needed (congestion).   Yes [provider]  dexamethasone (DECADRON) 4 MG tablet Take 2 tablets (8 mg total) by mouth daily. Start the day after bendamustine chemotherapy for 2 days. Take with food. 03/16/20  Yes Brunetta Genera, MD  latanoprost (XALATAN) 0.005 % ophthalmic solution Place 1 drop into both eyes at bedtime.   Yes [provider]  lidocaine-prilocaine (EMLA) cream Apply to affected area once Patient taking differently: Apply 1 application topically as needed (port access). Apply to affected  area once 03/16/20  Yes Kale, Cloria Spring, MD  LORazepam (ATIVAN) 0.5 MG tablet Take 1 tablet (0.5 mg total) by mouth every 6 (six) hours as needed (Nausea or vomiting). 03/16/20  Yes Brunetta Genera, MD  losartan-hydrochlorothiazide (HYZAAR) 50-12.5 MG tablet TAKE 1 TABLET BY MOUTH EVERY DAY Patient taking differently: Take 1 tablet by mouth daily. 04/21/20  Yes Denita Lung, MD  Multiple Vitamins-Minerals (ICAPS AREDS 2 PO) Take 1 tablet by mouth daily.   Yes [provider]  ondansetron (ZOFRAN) 8 MG tablet Take 1 tablet (8 mg total) by mouth 2 (two) times daily as needed for refractory nausea / vomiting. Start on day 2 after bendamustine chemo. 03/16/20  Yes Brunetta Genera, MD  pravastatin (PRAVACHOL) 40 MG tablet Take 1 tablet by mouth once daily Patient taking differently: Take 40 mg by mouth daily. 06/15/20  Yes Denita Lung, MD  prochlorperazine (COMPAZINE) 10 MG tablet Take 1 tablet (10 mg total) by mouth every 6 (six) hours as needed (Nausea or vomiting). 03/16/20  Yes Brunetta Genera, MD  doxycycline (VIBRAMYCIN) 100 MG capsule Take 100 mg by mouth 2 (two) times daily. 06/30/20   [provider]    Allergies    Lisinopril and Penicillins  Review of Systems   Review of Systems  All other systems reviewed and are negative.   Physical Exam Updated Vital Signs BP 130/74   Pulse (!) 57   Temp 98.3 F (36.8 C) (Oral)   Resp 12   Ht _0  (1.702 m)   Wt 76.2 kg   SpO2 98%   BMI 26.31 kg/m   Physical Exam Vitals and nursing note reviewed.  Constitutional:      Appearance: He is well-developed.  HENT:     Head: Normocephalic and atraumatic.  Cardiovascular:     Rate and Rhythm: Normal rate and regular rhythm.     Heart sounds: No murmur heard.   Pulmonary:     Effort: Pulmonary effort is normal. No respiratory distress.     Breath sounds: Normal breath sounds.  Abdominal:     Palpations: Abdomen is soft.     Tenderness: There  is no abdominal tenderness. There is no guarding or rebound.  Genitourinary:    Comments: Firm and tender right inguinal hernia. No overlying erythema. Musculoskeletal:        General: No tenderness.  Skin:    General: Skin is warm and dry.  Neurological:     Mental Status: He is alert and oriented to person, place, and time.  Psychiatric:        Behavior: Behavior normal.     ED Results / Procedures / Treatments   Labs (all labs ordered are listed, but only abnormal results are displayed) Labs Reviewed  COMPREHENSIVE METABOLIC PANEL - Abnormal; Notable for the following components:  Result Value   Glucose, Bld 103 (*)    All other components within normal limits  CBC WITH DIFFERENTIAL/PLATELET - Abnormal; Notable for the following components:   WBC 14.4 (*)    RBC 4.08 (*)    Hemoglobin 12.7 (*)    HCT 37.8 (*)    Neutro Abs 12.8 (*)    Lymphs Abs 0.5 (*)    Monocytes Absolute 1.1 (*)    All other components within normal limits    EKG None  Radiology No results found.  Procedures Hernia reduction  Date/Time: 07/09/2020 2:03 PM Performed by: Quintella Reichert, MD Authorized by: Quintella Reichert, MD  Consent: Verbal consent obtained. Consent given by: patient Patient identity confirmed: verbally with patient Local anesthesia used: no  Anesthesia: Local anesthesia used: no  Sedation: Patient sedated: no  Patient tolerance: patient tolerated the procedure well with no immediate complications      Medications Ordered in ED Medications  morphine 4 MG/ML injection 4 mg (4 mg Intravenous Given 07/09/20 1404)  ondansetron (ZOFRAN) injection 4 mg (4 mg Intravenous Given 07/09/20 1359)    ED Course  I have reviewed the triage vital signs and the nursing notes.  Pertinent labs & imaging results that were available during my care of the patient were reviewed by me and considered in my medical decision making (see chart for details).    MDM  Rules/Calculators/A&P                         patient with history of mantle cell lymphoma, currently undergoing treatment here for evaluation of right inguinal hernia pain. Hernia reduced in the department. CBC with mild leukocytosis. On reassessment in the department patient is pain free. No evidence of acute infectious process at this time. Discussed with patient home care for inguinal hernia with close return precautions if he has recurrent symptoms.   Final Clinical Impression(s) / ED Diagnoses Final diagnoses:  Right inguinal hernia    Rx / DC Orders ED Discharge Orders    None       Quintella Reichert, MD 07/09/20 1528

## 2020-07-09 NOTE — ED Triage Notes (Signed)
Pt came from home via EMS. C/c right inguinal hernia protruding into scrotum, not reducible by gentle pressure. Dx with right hernia couple months ago that is normally reducible with gentle pressure, but unable to reduce today. Left inguinal hernia repair 2 years ago. Prostate removed for prostate cancer in 2020. Undergoing tx for lymphoma now, PCP told him to wait for hernia surgery until finished with lymphoma tx. Pt states he cannot wait that long now. 145/84 58 bpm 98 % on RA

## 2020-07-09 NOTE — ED Notes (Signed)
Port a cath in left chest accessed. Blood return noted upon access.

## 2020-07-09 NOTE — Telephone Encounter (Signed)
Returned call to pt about his having a problem with his hernia. Pt was at the [facility] ER being treated. Told pt I would make Dr Irene Limbo aware. Pt will update this office as to his outcome.

## 2020-07-09 NOTE — ED Notes (Signed)
Implanted port in left chest heparin flushed, locked, and deaccessed.

## 2020-07-18 NOTE — Progress Notes (Signed)
HEMATOLOGY/ONCOLOGY CLINIC NOTE  Date of Service: 07/19/2020  Patient Care Team: Denita Lung, MD as PCP - General (Family Medicine)  CHIEF COMPLAINTS/PURPOSE OF CONSULTATION:  History of diffuse Large B-Cell Lymphoma Follow-up for continued management of mantle cell lymphoma  HISTORY OF PRESENTING ILLNESS:   Mario Garcia is a wonderful 87 y.o. male who has been referred to [place] by Dr. Jill Alexanders for evaluation and management of Diffuse Large B-Cell Lymphoma. The pt reports that he is doing well overall.   The pt reports that he feels that he has been a little more tired recently. He first noticed some "nodules" on his neck appear "4-6 weeks ago." He notes that these nodules haven't been painful. He appears to have presented to care with his PCP on 05/28/18, Dr. Redmond School. He notes that he has noticed that he has been sweating more in the last year. He denies noticing any other new lumps or bumps. He denies any fevers, chills, drenching night sweats, or unexpected weight loss. He notes that he has had some changes in swallowing over the last 6 months, which he characterizes as "getting strangled on his saliva every now and then." He denies abdominal pains, acid reflux, changes in bowel habits, leg swelling, skin rashes. He endorses a deep pain "every once in a while," in his left groin. He denies headaches. He endorses glaucoma and a history of cataracts. He sees Dr. Hetty Blend in ophthalmology. He denies any other concern in the last 6 months.  The pt notes that he lives independently with his wife and still is able to do all he wants to do. He walks on trails with his wife and notes that he can generally walk as far as he likes to.  The pt notes that he had prostate cancer in 2008 or 2009, s/p prostatectomy. He did not require RT nor systemic treatments. He denies lung, heart or kidney problems.  Of note prior to the patient's visit today, pt has had a CT Neck completed on 06/05/18  with results revealing Pathologic RIGHT-sided level II, III, IV, and V lymphadenopathy, most consistent with metastatic carcinoma. An obvious primary source is not identified. Tissue sampling is warranted.  Most recent lab results (07/08/18) of CBC and CMP is as follows: all values are WNL except for PLT at 129k.  On review of systems, pt reports slightly more tired, neck nodules, some changes in swallowing, staying active, and denies fevers, chills, drenching night sweats, unexpected weight loss, abdominal pains, changes in bowel habits, skin rashes, leg swelling, CP, SOB, difficulty breathing, headaches, other lumps or bumps, skin lesions, mouth sores, pain along the spine, back pain, leg swelling, and any other symptoms.   On PMHx the pt reports localized Prostate cancer in 2008 s/p prostatectomy.  On Social Hx the pt reports that he quit smoking over 50 years ago. He notes that he consumes about 1 glass of wine every day, without concerns for excessive drinking. 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 He denies concern for chemical or radiation exposure. On Family Hx the pt reports wife with Stage IV Non Hodgkin's Lymphoma, paternal grandmother with leukemia in her 60s, father with unspecified abdominal cancer.   INTERVAL HISTORY:   Mario Garcia returns today for management and evaluation of his mantle cell lymphoma and a toxicity check with C5D1 of BR. The patient's last visit with [place] was on 06/17/2020.The pt reports that he is doing well  overall. We are joined today by his wife.   The pt reports that he recently went to the ED due to his hernia not being able to e pushed back in by himself. The pt notes that this occurred recently just after waking up, denying any motion or activity that would have caused it to worsen. The pt notes that they were able to reduce it in the ED after some effort and told him to call the [place] surgery. The pt notes that this  was the first instance where he had some pain as related to the hernia.  Lab results today 07/19/2020 of CBC w/diff and CMP is as follows: all values are WNL except for  RBC of 3.98, Hgb of 12.6, HCT of 36.7, Plt of 146K.  07/19/2020 LDH pending.  On review of systems, pt reports intermittent hernial pain and denies fevers, chills, night sweats, worsening fatigue, new lumps/bumps, back pain, straining while having a bowel movement, cough, and any other symptoms.  MEDICAL HISTORY:  Past Medical History:  Diagnosis Date  . Cancer ([place])    PROSTATE  . History of colonic polyps   . History of kidney stones   . Hypertension   . Inguinal hernia 03/2002  . Pneumonia    "years ago"    SURGICAL HISTORY: Past Surgical History:  Procedure Laterality Date  . COLONOSCOPY    . DIRECT LARYNGOSCOPY N/A 07/08/2018   Procedure: DIRECT LARYNGOSCOPY;  Surgeon: Leta Baptist, MD;  Location: [facility];  Service: ENT;  Laterality: N/A;  . ESOPHAGOSCOPY N/A 07/08/2018   Procedure: ESOPHAGOSCOPY;  Surgeon: Leta Baptist, MD;  Location: [facility];  Service: ENT;  Laterality: N/A;  . FLEXIBLE BRONCHOSCOPY N/A 07/08/2018   Procedure: FLEXIBLE BRONCHOSCOPY;  Surgeon: Leta Baptist, MD;  Location: [facility];  Service: ENT;  Laterality: N/A;  . HERNIA REPAIR Left    inguinal hernia  . IR IMAGING GUIDED PORT INSERTION  07/30/2018  . MASS BIOPSY Right 07/08/2018   Procedure: RIGHT NECK MASS EXCISIONAL BIOPSY;  Surgeon: Leta Baptist, MD;  Location: [facility];  Service: ENT;  Laterality: Right;  . PATELLA FRACTURE SURGERY Left   . PROSTATE SURGERY  10/2005   RADIAL PROSTATECTOMY  . ROTATOR CUFF REPAIR Bilateral     SOCIAL HISTORY: Social History   Socioeconomic History  . Marital status: Married    Spouse name: Not on file  . Number of children: Not on file  . Years of education: Not on file  . Highest education level: Not on file  Occupational History  . Not on file  Tobacco Use  . Smoking status: Former Research scientist (life sciences)  . Smokeless tobacco:  Never Used  . Tobacco comment: stopped at the age of  52  Vaping Use  . Vaping Use: Never used  Substance and Sexual Activity  . Alcohol use: Yes    Alcohol/week: 6.0 standard drinks    Types: 6 Standard drinks or equivalent per week  . Drug use: No  . Sexual activity: Not Currently  Other Topics Concern  . Not on file  Social History Narrative  . Not on file   Social Determinants of Health   Financial Resource Strain: Not on file  Food Insecurity: Not on file  Transportation Needs: Not on file  Physical Activity: Not on file  Stress: Not on file  Social Connections: Not on file  Intimate Partner Violence: Not on file    FAMILY HISTORY: Family History  Problem Relation Age of Onset  . Cancer  Father   . Cancer Brother     ALLERGIES:  is allergic to lisinopril and penicillins.  MEDICATIONS:  Current Outpatient Medications  Medication Sig Dispense Refill  . acyclovir (ZOVIRAX) 400 MG tablet Take 2 tablets (800 mg total) by mouth 2 (two) times daily. 120 tablet 3  . aspirin-sod bicarb-citric acid (ALKA-SELTZER) 325 MG TBEF tablet Take 650 mg by mouth every 6 (six) hours as needed (congestion).    Marland Kitchen dexamethasone (DECADRON) 4 MG tablet Take 2 tablets (8 mg total) by mouth daily. Start the day after bendamustine chemotherapy for 2 days. Take with food. 30 tablet 1  . doxycycline (VIBRAMYCIN) 100 MG capsule Take 100 mg by mouth 2 (two) times daily.    Marland Kitchen latanoprost (XALATAN) 0.005 % ophthalmic solution Place 1 drop into both eyes at bedtime.    . lidocaine-prilocaine (EMLA) cream Apply to affected area once (Patient taking differently: Apply 1 application topically as needed (port access). Apply to affected area once) 30 g 3  . LORazepam (ATIVAN) 0.5 MG tablet Take 1 tablet (0.5 mg total) by mouth every 6 (six) hours as needed (Nausea or vomiting). 30 tablet 0  . losartan-hydrochlorothiazide (HYZAAR) 50-12.5 MG tablet TAKE 1 TABLET BY MOUTH EVERY DAY (Patient taking differently:  Take 1 tablet by mouth daily.) 90 tablet 0  . Multiple Vitamins-Minerals (ICAPS AREDS 2 PO) Take 1 tablet by mouth daily.    . ondansetron (ZOFRAN) 8 MG tablet Take 1 tablet (8 mg total) by mouth 2 (two) times daily as needed for refractory nausea / vomiting. Start on day 2 after bendamustine chemo. 30 tablet 1  . pravastatin (PRAVACHOL) 40 MG tablet Take 1 tablet by mouth once daily (Patient taking differently: Take 40 mg by mouth daily.) 90 tablet 0  . prochlorperazine (COMPAZINE) 10 MG tablet Take 1 tablet (10 mg total) by mouth every 6 (six) hours as needed (Nausea or vomiting). 30 tablet 1   No current facility-administered medications for this visit.    REVIEW OF SYSTEMS:   10 Point review of Systems was done is negative except as noted above.  PHYSICAL EXAMINATION: ECOG FS:1 - Symptomatic but completely ambulatory  Vitals:   07/19/20 0849  BP: (!) 132/59  Pulse: 64  Resp: 17  Temp: 97.8 F (36.6 C)  SpO2: 97%   Wt Readings from Last 3 Encounters:  07/19/20 160 lb 6.4 oz (72.8 kg)  07/09/20 168 lb (76.2 kg)  06/18/20 162 lb 1 oz (73.5 kg)   Body mass index is 25.12 kg/m.     GENERAL:alert, in no acute distress and comfortable SKIN: no acute rashes, no significant lesions EYES: conjunctiva are pink and non-injected, sclera anicteric OROPHARYNX: MMM, no exudates, no oropharyngeal erythema or ulceration NECK: supple, no JVD LYMPH:  no palpable lymphadenopathy in the cervical, axillary or inguinal regions LUNGS: clear to auscultation b/l with normal respiratory effort HEART: regular rate & rhythm ABDOMEN:  normoactive bowel sounds , non tender, not distended. Extremity: no pedal edema PSYCH: alert & oriented x 3 with fluent speech NEURO: no focal motor/sensory deficits   LABORATORY DATA:  I have reviewed the data as listed  . CBC Latest Ref Rng & Units 07/19/2020 07/09/2020 06/17/2020  WBC 4.0 - 10.5 K/uL 4.7 14.4(H) 5.5  Hemoglobin 13.0 - 17.0 g/dL 12.6(L) 12.7(L)  12.0(L)  Hematocrit 39.0 - 52.0 % 36.7(L) 37.8(L) 36.2(L)  Platelets 150 - 400 K/uL 146(L) 167 147(L)    . CMP Latest Ref Rng & Units 07/19/2020 07/09/2020 06/17/2020  Glucose 70 - 99 mg/dL 97 103(H) 108(H)  BUN 8 - 23 mg/dL _0 Creatinine 0.61 - 1.24 mg/dL 0.80 0.78 0.82  Sodium 135 - 145 mmol/L 140 140 141  Potassium 3.5 - 5.1 mmol/L 4.0 3.7 3.5  Chloride 98 - 111 mmol/L 103 102 103  CO2 22 - 32 mmol/L _1 Calcium 8.9 - 10.3 mg/dL 8.9 8.9 8.5(L)  Total Protein 6.5 - 8.1 g/dL 6.9 6.6 6.7  Total Bilirubin 0.3 - 1.2 mg/dL 0.5 1.1 0.4  Alkaline Phos 38 - 126 U/L 63 71 61  AST 15 - 41 U/L _2 ALT 0 - 44 U/L _3 . Lab Results  Component Value Date   LDH 151 06/17/2020   02/23/2020 Right Cervical Lymph Node Surgical Pathology [phone]):    02/23/2020 FISH Analysis:   02/23/2020 FISH Mutation Analysis:    03/03/2019 NM PET Image Restag (PS) Skull Base To Thigh (Accession [id number]):   07/08/18 Right Cervical Tissue Biopsy:    RADIOGRAPHIC STUDIES: I have personally reviewed the radiological images as listed and agreed with the findings in the report. No results found.  ASSESSMENT & PLAN:   87 y.o. male with  1. H/o Diffuse Large B-Cell Lymphoma, Stage IV- now in remission Presenting without constitutional symptoms. Palpable right cervical and supraclavicular lymphadenopathy.   07/08/18 Right cervical soft tissue biopsy revealed Diffuse Large B-Cell Lymphoma, germinal center type   06/05/18 CT Neck revealed Pathologic RIGHT-sided level II, III, IV, and V lymphadenopathy, most consistent with metastatic carcinoma. An obvious primary source is not identified. Tissue sampling is warranted.  07/16/18 Hep B and Hep C negative  07/17/18 ECHO revealed LV EF of 60-65%  07/22/18 PET/CT revealed "Hypermetabolic adenopathy especially concentrated in the right neck but also in the left neck, chest, abdomen, and pelvis. The adenopathy is primarily  Deauville 5 although some few scattered smaller lesions are Deauville 4. 2. Diffuse abnormal splenic activity, Deauville 5, without overt splenomegaly. 3. There is also hypermetabolic Deauville 5 activity in the mildly thickened distal appendix, and raising suspicion for involvement of the appendix. 4. Other imaging findings of potential clinical significance: Aortic Atherosclerosis. Coronary atherosclerosis."  10/03/2018 PET scan revealed "Interval response to therapy with stable to decreased size of lymph nodes on CT and generalized decrease in hypermetabolism of the abnormal nodes. Hypermetabolism in the lymph nodes today is compatible with a combination of Deauville 3 and Deauville 4 disease. Stable tiny bilateral pulmonary nodules. Increase radiotracer accumulation in the marrow space on today's study, presumably representing marrow stimulatory effects of therapy."  2. Currently with new Non-Hodgkin's B-cell Lymphoma - consistent with Mantle Cell 02/23/2020 Right Cervical Lymph Node Surgical Pathology [phone]) which revealed "Non-Hodgkin B-cell lymphoma. Ki-67 generally shows low expression (<5-15%)." 02/23/2020 FISH Mutation Analysis which revealed "t(11;14): DETECTED".  PLAN: -Discussed pt labwork today, 07/19/2020; blood counts stable, chemistries all normal. LDH pending. -Advised pt we will continue with C5 as planned today. Discussed addressing hernia instead of C6 or continuing with C6 prior to hernia surgery. The pt wishes to be referred now and see if the timeline entails needing to cut C6 or not. -Will plan to hold C6 at this time due to priority of hernia surgery. Will wait for repeat PET scan a few months post-op. -Discussed maintenance treatment versus just monitoring. The pt wishes to continue to with maintenance treatment. -Recommended pt avoid activities that cause straining of hernia-- heavy lifting, excessive coughing, etc. -Discussed maintenance  treatment. Advised pt that  it is not curable but treatable. The maintenance antibody is continued for up to 2-3 years pending toleration and progression. -Continue dosage of Acyclovir at 800 mg BID. Pt is aware to continue to monitor spots that look like Shingles. -The pt has no prohibitive toxicities from continuing C5D1 Benamustine/Rituxan at this time. -Continue the same dosage of 62m/m^2 Benamustine. Continue Udenyca growth factor support given patients age and previous chemotherapy. -Advised pt that he is okay for surgery four weeks after chemotherapy from oncologic perspective. -[facility] -Will get repeat PET scans in 2.5 months. -Will see back in 3 months with labs.    FOLLOW UP: -[facility] -PET CT scan in 11 weeks -Return to clinic with Dr. KIrene Limbowith labs in 12 weeks -please schedule appointment for maintenance Rituxan every 2 months x 3 starting 1 week after his appointment in 12 weeks.    The total time spent in the appointment was 30  minutes and more than 50% was on counseling and direct patient cares.    All of the patient's questions were answered with apparent satisfaction. The patient knows to call the clinic with any problems, questions or concerns.   GSullivan LoneMD MLookout MountainAAHIVMS SH [facility] InstCSouthwest Ms Regional Medical CenterHematology/Oncology Physician [facility] (Office):       [phone](Work cell):  [phone](Fax):           [phone]  I, RReinaldo Raddle am acting as scribe for Dr. GSullivan Lone MD.   .I have reviewed the above documentation for accuracy and completeness, and I agree with the above. .Brunetta GeneraMD

## 2020-07-19 ENCOUNTER — Inpatient Hospital Stay: Payer: Medicare Other | Attending: Hematology | Admitting: Hematology

## 2020-07-19 ENCOUNTER — Inpatient Hospital Stay: Payer: Medicare Other

## 2020-07-19 ENCOUNTER — Other Ambulatory Visit: Payer: Self-pay

## 2020-07-19 VITALS — BP 115/55 | HR 51 | Temp 98.0°F | Resp 18

## 2020-07-19 VITALS — BP 132/59 | HR 64 | Temp 97.8°F | Resp 17 | Ht 67.0 in | Wt 160.4 lb

## 2020-07-19 DIAGNOSIS — Z79899 Other long term (current) drug therapy: Secondary | ICD-10-CM | POA: Insufficient documentation

## 2020-07-19 DIAGNOSIS — C8318 Mantle cell lymphoma, lymph nodes of multiple sites: Secondary | ICD-10-CM

## 2020-07-19 DIAGNOSIS — Z5112 Encounter for antineoplastic immunotherapy: Secondary | ICD-10-CM | POA: Insufficient documentation

## 2020-07-19 DIAGNOSIS — Z5111 Encounter for antineoplastic chemotherapy: Secondary | ICD-10-CM | POA: Diagnosis not present

## 2020-07-19 DIAGNOSIS — Z5189 Encounter for other specified aftercare: Secondary | ICD-10-CM | POA: Diagnosis not present

## 2020-07-19 DIAGNOSIS — Z7189 Other specified counseling: Secondary | ICD-10-CM

## 2020-07-19 DIAGNOSIS — K4091 Unilateral inguinal hernia, without obstruction or gangrene, recurrent: Secondary | ICD-10-CM

## 2020-07-19 DIAGNOSIS — Z95828 Presence of other vascular implants and grafts: Secondary | ICD-10-CM

## 2020-07-19 LAB — CBC WITH DIFFERENTIAL/PLATELET
Abs Immature Granulocytes: 0.02 10*3/uL (ref 0.00–0.07)
Basophils Absolute: 0 10*3/uL (ref 0.0–0.1)
Basophils Relative: 0 %
Eosinophils Absolute: 0.1 10*3/uL (ref 0.0–0.5)
Eosinophils Relative: 1 %
HCT: 36.7 % — ABNORMAL LOW (ref 39.0–52.0)
Hemoglobin: 12.6 g/dL — ABNORMAL LOW (ref 13.0–17.0)
Immature Granulocytes: 0 %
Lymphocytes Relative: 26 %
Lymphs Abs: 1.2 10*3/uL (ref 0.7–4.0)
MCH: 31.7 pg (ref 26.0–34.0)
MCHC: 34.3 g/dL (ref 30.0–36.0)
MCV: 92.2 fL (ref 80.0–100.0)
Monocytes Absolute: 0.8 10*3/uL (ref 0.1–1.0)
Monocytes Relative: 17 %
Neutro Abs: 2.6 10*3/uL (ref 1.7–7.7)
Neutrophils Relative %: 56 %
Platelets: 146 10*3/uL — ABNORMAL LOW (ref 150–400)
RBC: 3.98 MIL/uL — ABNORMAL LOW (ref 4.22–5.81)
RDW: 13.7 % (ref 11.5–15.5)
WBC: 4.7 10*3/uL (ref 4.0–10.5)
nRBC: 0 % (ref 0.0–0.2)

## 2020-07-19 LAB — CMP (CANCER CENTER ONLY)
ALT: 13 U/L (ref 0–44)
AST: 18 U/L (ref 15–41)
Albumin: 4 g/dL (ref 3.5–5.0)
Alkaline Phosphatase: 63 U/L (ref 38–126)
Anion gap: 9 (ref 5–15)
BUN: 13 mg/dL (ref 8–23)
CO2: 28 mmol/L (ref 22–32)
Calcium: 8.9 mg/dL (ref 8.9–10.3)
Chloride: 103 mmol/L (ref 98–111)
Creatinine: 0.8 mg/dL (ref 0.61–1.24)
GFR, Estimated: >60 mL/min (ref >60)
Glucose, Bld: 97 mg/dL (ref 70–99)
Potassium: 4 mmol/L (ref 3.5–5.1)
Sodium: 140 mmol/L (ref 135–145)
Total Bilirubin: 0.5 mg/dL (ref 0.3–1.2)
Total Protein: 6.9 g/dL (ref 6.5–8.1)

## 2020-07-19 LAB — LACTATE DEHYDROGENASE: LDH: 164 U/L (ref 98–192)

## 2020-07-19 MED ORDER — SODIUM CHLORIDE 0.9% FLUSH
10.0000 mL | INTRAVENOUS | Status: DC | PRN
  Administered 2020-07-19: 10 mL
  Filled 2020-07-19: qty 10

## 2020-07-19 MED ORDER — PALONOSETRON HCL INJECTION 0.25 MG/5ML
0.2500 mg | Freq: Once | INTRAVENOUS | Status: AC
  Administered 2020-07-19: 0.25 mg via INTRAVENOUS

## 2020-07-19 MED ORDER — PALONOSETRON HCL INJECTION 0.25 MG/5ML
INTRAVENOUS | Status: AC
  Filled 2020-07-19: qty 5

## 2020-07-19 MED ORDER — SODIUM CHLORIDE 0.9 % IV SOLN
Freq: Once | INTRAVENOUS | Status: AC
  Filled 2020-07-19: qty 250

## 2020-07-19 MED ORDER — SODIUM CHLORIDE 0.9 % IV SOLN
375.0000 mg/m2 | Freq: Once | INTRAVENOUS | Status: AC
  Administered 2020-07-19: 700 mg via INTRAVENOUS
  Filled 2020-07-19: qty 50

## 2020-07-19 MED ORDER — ACETAMINOPHEN 325 MG PO TABS
650.0000 mg | ORAL_TABLET | Freq: Once | ORAL | Status: AC
  Administered 2020-07-19: 650 mg via ORAL

## 2020-07-19 MED ORDER — DIPHENHYDRAMINE HCL 25 MG PO CAPS
50.0000 mg | ORAL_CAPSULE | Freq: Once | ORAL | Status: AC
  Administered 2020-07-19: 50 mg via ORAL

## 2020-07-19 MED ORDER — HEPARIN SOD (PORK) LOCK FLUSH 100 UNIT/ML IV SOLN
500.0000 [IU] | Freq: Once | INTRAVENOUS | Status: AC | PRN
  Administered 2020-07-19: 500 [IU]
  Filled 2020-07-19: qty 5

## 2020-07-19 MED ORDER — SODIUM CHLORIDE 0.9 % IV SOLN
70.0000 mg/m2 | Freq: Once | INTRAVENOUS | Status: AC
  Administered 2020-07-19: 125 mg via INTRAVENOUS
  Filled 2020-07-19: qty 5

## 2020-07-19 MED ORDER — ACETAMINOPHEN 325 MG PO TABS
ORAL_TABLET | ORAL | Status: AC
  Filled 2020-07-19: qty 2

## 2020-07-19 MED ORDER — DIPHENHYDRAMINE HCL 25 MG PO CAPS
ORAL_CAPSULE | ORAL | Status: AC
  Filled 2020-07-19: qty 2

## 2020-07-19 MED ORDER — SODIUM CHLORIDE 0.9 % IV SOLN
10.0000 mg | Freq: Once | INTRAVENOUS | Status: AC
  Administered 2020-07-19: 10 mg via INTRAVENOUS
  Filled 2020-07-19: qty 10

## 2020-07-19 NOTE — Patient Instructions (Signed)
[facility] Discharge Instructions for Patients Receiving Chemotherapy  Today you received the following chemotherapy agents: rituximab and bendamustine.  To help prevent nausea and vomiting after your treatment, we encourage you to take your nausea medication as directed.   If you develop nausea and vomiting that is not controlled by your nausea medication, call the clinic.   BELOW ARE SYMPTOMS THAT SHOULD BE REPORTED IMMEDIATELY:  *FEVER GREATER THAN 100.5 F  *CHILLS WITH OR WITHOUT FEVER  NAUSEA AND VOMITING THAT IS NOT CONTROLLED WITH YOUR NAUSEA MEDICATION  *UNUSUAL SHORTNESS OF BREATH  *UNUSUAL BRUISING OR BLEEDING  TENDERNESS IN MOUTH AND THROAT WITH OR WITHOUT PRESENCE OF ULCERS  *URINARY PROBLEMS  *BOWEL PROBLEMS  UNUSUAL RASH Items with * indicate a potential emergency and should be followed up as soon as possible.  Feel free to call the clinic should you have any questions or concerns. The clinic phone number is [phone].  Please show the CHEMO ALERT CARD at check-in to the Emergency Department and triage nurse.   

## 2020-07-20 ENCOUNTER — Inpatient Hospital Stay: Payer: Medicare Other

## 2020-07-20 ENCOUNTER — Telehealth: Payer: Self-pay | Admitting: Hematology

## 2020-07-20 VITALS — BP 124/63 | HR 68 | Temp 98.2°F | Resp 18

## 2020-07-20 DIAGNOSIS — Z7189 Other specified counseling: Secondary | ICD-10-CM

## 2020-07-20 DIAGNOSIS — C8318 Mantle cell lymphoma, lymph nodes of multiple sites: Secondary | ICD-10-CM

## 2020-07-20 DIAGNOSIS — Z79899 Other long term (current) drug therapy: Secondary | ICD-10-CM | POA: Diagnosis not present

## 2020-07-20 DIAGNOSIS — Z5112 Encounter for antineoplastic immunotherapy: Secondary | ICD-10-CM | POA: Diagnosis not present

## 2020-07-20 DIAGNOSIS — Z5111 Encounter for antineoplastic chemotherapy: Secondary | ICD-10-CM | POA: Diagnosis not present

## 2020-07-20 DIAGNOSIS — Z5189 Encounter for other specified aftercare: Secondary | ICD-10-CM | POA: Diagnosis not present

## 2020-07-20 MED ORDER — SODIUM CHLORIDE 0.9 % IV SOLN
Freq: Once | INTRAVENOUS | Status: AC
Start: 2020-07-20 — End: 2020-07-20
  Filled 2020-07-20: qty 250

## 2020-07-20 MED ORDER — HEPARIN SOD (PORK) LOCK FLUSH 100 UNIT/ML IV SOLN
500.0000 [IU] | Freq: Once | INTRAVENOUS | Status: AC | PRN
Start: 2020-07-20 — End: 2020-07-20
  Administered 2020-07-20: 500 [IU]
  Filled 2020-07-20: qty 5

## 2020-07-20 MED ORDER — DEXAMETHASONE SODIUM PHOSPHATE 100 MG/10ML IJ SOLN
10.0000 mg | Freq: Once | INTRAMUSCULAR | Status: AC
  Administered 2020-07-20: 10 mg via INTRAVENOUS
  Filled 2020-07-20: qty 10

## 2020-07-20 MED ORDER — SODIUM CHLORIDE 0.9 % IV SOLN
70.0000 mg/m2 | Freq: Once | INTRAVENOUS | Status: AC
  Administered 2020-07-20: 125 mg via INTRAVENOUS
  Filled 2020-07-20: qty 5

## 2020-07-20 MED ORDER — SODIUM CHLORIDE 0.9% FLUSH
10.0000 mL | INTRAVENOUS | Status: DC | PRN
  Administered 2020-07-20: 10 mL
  Filled 2020-07-20: qty 10

## 2020-07-20 NOTE — Telephone Encounter (Signed)
Scheduled follow-up appointments per 4/25 los. Patient is aware. 

## 2020-07-20 NOTE — Patient Instructions (Signed)
[facility] MEDICAL ONCOLOGY  Discharge Instructions: Thank you for choosing [facility] to provide your oncology and hematology care.   If you have a lab appointment with the Cancer Center, please go directly to the Cancer Center and check in at the registration area.   Wear comfortable clothing and clothing appropriate for easy access to any Portacath or PICC line.   We strive to give you quality time with your provider. You may need to reschedule your appointment if you arrive late (15 or more minutes).  Arriving late affects you and other patients whose appointments are after yours.  Also, if you miss three or more appointments without notifying the office, you may be dismissed from the clinic at the provider's discretion.      For prescription refill requests, have your pharmacy contact our office and allow 72 hours for refills to be completed.    Today you received the following chemotherapy and/or immunotherapy agents: Bendeka   To help prevent nausea and vomiting after your treatment, we encourage you to take your nausea medication as directed.  BELOW ARE SYMPTOMS THAT SHOULD BE REPORTED IMMEDIATELY: *FEVER GREATER THAN 100.4 F (38 C) OR HIGHER *CHILLS OR SWEATING *NAUSEA AND VOMITING THAT IS NOT CONTROLLED WITH YOUR NAUSEA MEDICATION *UNUSUAL SHORTNESS OF BREATH *UNUSUAL BRUISING OR BLEEDING *URINARY PROBLEMS (pain or burning when urinating, or frequent urination) *BOWEL PROBLEMS (unusual diarrhea, constipation, pain near the anus) TENDERNESS IN MOUTH AND THROAT WITH OR WITHOUT PRESENCE OF ULCERS (sore throat, sores in mouth, or a toothache) UNUSUAL RASH, SWELLING OR PAIN  UNUSUAL VAGINAL DISCHARGE OR ITCHING   Items with * indicate a potential emergency and should be followed up as soon as possible or go to the Emergency Department if any problems should occur.  Please show the CHEMOTHERAPY ALERT CARD or IMMUNOTHERAPY ALERT CARD at check-in to the  Emergency Department and triage nurse.  Should you have questions after your visit or need to cancel or reschedule your appointment, please contact [facility] MEDICAL ONCOLOG[facility]  Dept: [phone]  and follow the prompts.  Office hours are 8:00 a.m. to 4:30 p.m. Monday - Friday. Please note that voicemails left after 4:00 p.m. may not be returned until the following business day.  We are closed weekends and major holidays. You have access to a nurse at all times for urgent questions. Please call the main number to the clinic Dept: [phone] and follow the prompts.   For any non-urgent questions, you may also contact your provider using MyChart. We now offer e-Visits for anyone 18 and older to request care online for non-urgent symptoms. For details visit [email or web address].   Also download the MyChart app! Go to the app store, search "MyChart", open the app, select [facility], and log in with your MyChart username and password.  Due to Covid, a mask is required upon entering the hospital/clinic. If you do not have a mask, one will be given to you upon arrival. For doctor visits, patients may have 1 support person aged 18 or older with them. For treatment visits, patients cannot have anyone with them due to current Covid guidelines and our immunocompromised population.   

## 2020-07-21 ENCOUNTER — Other Ambulatory Visit: Payer: Self-pay | Admitting: Family Medicine

## 2020-07-21 ENCOUNTER — Other Ambulatory Visit: Payer: Self-pay

## 2020-07-21 ENCOUNTER — Inpatient Hospital Stay: Payer: Medicare Other

## 2020-07-21 VITALS — BP 128/68 | HR 62 | Temp 98.2°F | Resp 18

## 2020-07-21 DIAGNOSIS — Z5112 Encounter for antineoplastic immunotherapy: Secondary | ICD-10-CM | POA: Diagnosis not present

## 2020-07-21 DIAGNOSIS — C8318 Mantle cell lymphoma, lymph nodes of multiple sites: Secondary | ICD-10-CM

## 2020-07-21 DIAGNOSIS — Z7189 Other specified counseling: Secondary | ICD-10-CM

## 2020-07-21 DIAGNOSIS — Z5189 Encounter for other specified aftercare: Secondary | ICD-10-CM | POA: Diagnosis not present

## 2020-07-21 DIAGNOSIS — Z79899 Other long term (current) drug therapy: Secondary | ICD-10-CM | POA: Diagnosis not present

## 2020-07-21 DIAGNOSIS — I10 Essential (primary) hypertension: Secondary | ICD-10-CM

## 2020-07-21 DIAGNOSIS — Z5111 Encounter for antineoplastic chemotherapy: Secondary | ICD-10-CM | POA: Diagnosis not present

## 2020-07-21 MED ORDER — PEGFILGRASTIM-CBQV 6 MG/0.6ML ~~LOC~~ SOSY
6.0000 mg | PREFILLED_SYRINGE | Freq: Once | SUBCUTANEOUS | Status: AC
  Administered 2020-07-21: 6 mg via SUBCUTANEOUS

## 2020-07-21 NOTE — Patient Instructions (Signed)
Pegfilgrastim injection What is this medicine? PEGFILGRASTIM (PEG fil gra stim) is a long-acting granulocyte colony-stimulating factor that stimulates the growth of neutrophils, a type of white blood cell important in the body's fight against infection. It is used to reduce the incidence of fever and infection in patients with certain types of cancer who are receiving chemotherapy that affects the bone marrow, and to increase survival after being exposed to high doses of radiation. This medicine may be used for other purposes; ask your health care provider or pharmacist if you have questions. COMMON BRAND NAME(S): Fulphila, Neulasta, Nyvepria, UDENYCA, Ziextenzo What should I tell my health care provider before I take this medicine? They need to know if you have any of these conditions:  kidney disease  latex allergy  ongoing radiation therapy  sickle cell disease  skin reactions to acrylic adhesives (On-Body Injector only)  an unusual or allergic reaction to pegfilgrastim, filgrastim, other medicines, foods, dyes, or preservatives  pregnant or trying to get pregnant  breast-feeding How should I use this medicine? This medicine is for injection under the skin. If you get this medicine at home, you will be taught how to prepare and give the pre-filled syringe or how to use the On-body Injector. Refer to the patient Instructions for Use for detailed instructions. Use exactly as directed. Tell your healthcare provider immediately if you suspect that the On-body Injector may not have performed as intended or if you suspect the use of the On-body Injector resulted in a missed or partial dose. It is important that you put your used needles and syringes in a special sharps container. Do not put them in a trash can. If you do not have a sharps container, call your pharmacist or healthcare provider to get one. Talk to your pediatrician regarding the use of this medicine in children. While this drug  may be prescribed for selected conditions, precautions do apply. Overdosage: If you think you have taken too much of this medicine contact a poison control center or emergency room at once. NOTE: This medicine is only for you. Do not share this medicine with others. What if I miss a dose? It is important not to miss your dose. Call your doctor or health care professional if you miss your dose. If you miss a dose due to an On-body Injector failure or leakage, a new dose should be administered as soon as possible using a single prefilled syringe for manual use. What may interact with this medicine? Interactions have not been studied. This list may not describe all possible interactions. Give your health care provider a list of all the medicines, herbs, non-prescription drugs, or dietary supplements you use. Also tell them if you smoke, drink alcohol, or use illegal drugs. Some items may interact with your medicine. What should I watch for while using this medicine? Your condition will be monitored carefully while you are receiving this medicine. You may need blood work done while you are taking this medicine. Talk to your health care provider about your risk of cancer. You may be more at risk for certain types of cancer if you take this medicine. If you are going to need a MRI, CT scan, or other procedure, tell your doctor that you are using this medicine (On-Body Injector only). What side effects may I notice from receiving this medicine? Side effects that you should report to your doctor or health care professional as soon as possible:  allergic reactions (skin rash, itching or hives, swelling of   the face, lips, or tongue)  back pain  dizziness  fever  pain, redness, or irritation at site where injected  pinpoint red spots on the skin  red or dark-brown urine  shortness of breath or breathing problems  stomach or side pain, or pain at the shoulder  swelling  tiredness  trouble  passing urine or change in the amount of urine  unusual bruising or bleeding Side effects that usually do not require medical attention (report to your doctor or health care professional if they continue or are bothersome):  bone pain  muscle pain This list may not describe all possible side effects. Call your doctor for medical advice about side effects. You may report side effects to FDA at 1-800-FDA-1088. Where should I keep my medicine? Keep out of the reach of children. If you are using this medicine at home, you will be instructed on how to store it. Throw away any unused medicine after the expiration date on the label. NOTE: This sheet is a summary. It may not cover all possible information. If you have questions about this medicine, talk to your doctor, pharmacist, or health care provider.  2021 Elsevier/Gold Standard (2019-04-04 13:20:51)  

## 2020-07-23 ENCOUNTER — Telehealth: Payer: Self-pay

## 2020-07-23 ENCOUNTER — Other Ambulatory Visit: Payer: Self-pay | Admitting: Hematology

## 2020-07-23 DIAGNOSIS — K4090 Unilateral inguinal hernia, without obstruction or gangrene, not specified as recurrent: Secondary | ICD-10-CM

## 2020-07-23 NOTE — Telephone Encounter (Signed)
Returned call to pt. Pt asked about the referral he discussed to [facility] for inguinal hernia repair. Informed pt I would fax the referral over now. Pt to call back middle of next week if he has not heard anything. Pt verbalized understanding.

## 2020-08-25 ENCOUNTER — Ambulatory Visit: Payer: Self-pay | Admitting: Surgery

## 2020-08-25 ENCOUNTER — Other Ambulatory Visit: Payer: Self-pay | Admitting: Hematology

## 2020-08-25 DIAGNOSIS — K4090 Unilateral inguinal hernia, without obstruction or gangrene, not specified as recurrent: Secondary | ICD-10-CM | POA: Diagnosis not present

## 2020-08-25 NOTE — H&P (Signed)
Surgical Evaluation Requesting provider: Dr. Irene Limbo Chief Complaint: hernia  HPI: Very pleasant 87 year old man presents with a one-year history of a partially reducible right groin bulge.  Not particularly painful and denies any specific GI symptoms but he did have an episode of incarceration requiring a visit to the emergency room recently.  He is actively undergoing therapy for diffuse large B-cell lymphoma and reports that he is essentially in a maintenance phase with his chemotherapy.  He has previously had a left inguinal hernia repair by Dr. Ottis Stain many years ago but no previous repair on the right.  Allergies  Allergen Reactions  . Lisinopril Cough  . Penicillins Rash    Did it involve swelling of the face/tongue/throat, SOB, or low BP? No Did it involve sudden or severe rash/hives, skin peeling, or any reaction on the inside of your mouth or nose? Yes Did you need to seek medical attention at a hospital or doctor's office? Yes When did it last happen?50years If all above answers are "NO", may proceed with cephalosporin use.    Past Medical History:  Diagnosis Date  . Cancer ([place])    PROSTATE  . History of colonic polyps   . History of kidney stones   . Hypertension   . Inguinal hernia 03/2002  . Pneumonia    "years ago"    Past Surgical History:  Procedure Laterality Date  . COLONOSCOPY    . DIRECT LARYNGOSCOPY N/A 07/08/2018   Procedure: DIRECT LARYNGOSCOPY;  Surgeon: Leta Baptist, MD;  Location: [facility];  Service: ENT;  Laterality: N/A;  . ESOPHAGOSCOPY N/A 07/08/2018   Procedure: ESOPHAGOSCOPY;  Surgeon: Leta Baptist, MD;  Location: [facility];  Service: ENT;  Laterality: N/A;  . FLEXIBLE BRONCHOSCOPY N/A 07/08/2018   Procedure: FLEXIBLE BRONCHOSCOPY;  Surgeon: Leta Baptist, MD;  Location: [facility];  Service: ENT;  Laterality: N/A;  . HERNIA REPAIR Left    inguinal hernia  . IR IMAGING GUIDED PORT INSERTION  07/30/2018  . MASS BIOPSY Right 07/08/2018   Procedure: RIGHT NECK MASS  EXCISIONAL BIOPSY;  Surgeon: Leta Baptist, MD;  Location: [facility];  Service: ENT;  Laterality: Right;  . PATELLA FRACTURE SURGERY Left   . PROSTATE SURGERY  10/2005   RADIAL PROSTATECTOMY  . ROTATOR CUFF REPAIR Bilateral     Family History  Problem Relation Age of Onset  . Cancer Father   . Cancer Brother     Social History   Socioeconomic History  . Marital status: Married    Spouse name: Not on file  . Number of children: Not on file  . Years of education: Not on file  . Highest education level: Not on file  Occupational History  . Not on file  Tobacco Use  . Smoking status: Former Research scientist (life sciences)  . Smokeless tobacco: Never Used  . Tobacco comment: stopped at the age of  54  Vaping Use  . Vaping Use: Never used  Substance and Sexual Activity  . Alcohol use: Yes    Alcohol/week: 6.0 standard drinks    Types: 6 Standard drinks or equivalent per week  . Drug use: No  . Sexual activity: Not Currently  Other Topics Concern  . Not on file  Social History Narrative  . Not on file   Social Determinants of Health   Financial Resource Strain: Not on file  Food Insecurity: Not on file  Transportation Needs: Not on file  Physical Activity: Not on file  Stress: Not on file  Social Connections: Not on  file    Current Outpatient Medications on File Prior to Visit  Medication Sig Dispense Refill  . acyclovir (ZOVIRAX) 400 MG tablet TAKE 1 TABLET BY MOUTH TWICE A DAY 180 tablet 1  . aspirin-sod bicarb-citric acid (ALKA-SELTZER) 325 MG TBEF tablet Take 650 mg by mouth every 6 (six) hours as needed (congestion).    Marland Kitchen dexamethasone (DECADRON) 4 MG tablet Take 2 tablets (8 mg total) by mouth daily. Start the day after bendamustine chemotherapy for 2 days. Take with food. 30 tablet 1  . doxycycline (VIBRAMYCIN) 100 MG capsule Take 100 mg by mouth 2 (two) times daily.    Marland Kitchen latanoprost (XALATAN) 0.005 % ophthalmic solution Place 1 drop into both eyes at bedtime.    . lidocaine-prilocaine (EMLA)  cream Apply to affected area once (Patient taking differently: Apply 1 application topically as needed (port access). Apply to affected area once) 30 g 3  . LORazepam (ATIVAN) 0.5 MG tablet Take 1 tablet (0.5 mg total) by mouth every 6 (six) hours as needed (Nausea or vomiting). 30 tablet 0  . losartan-hydrochlorothiazide (HYZAAR) 50-12.5 MG tablet TAKE 1 TABLET BY MOUTH EVERY DAY 90 tablet 0  . Multiple Vitamins-Minerals (ICAPS AREDS 2 PO) Take 1 tablet by mouth daily.    . ondansetron (ZOFRAN) 8 MG tablet Take 1 tablet (8 mg total) by mouth 2 (two) times daily as needed for refractory nausea / vomiting. Start on day 2 after bendamustine chemo. 30 tablet 1  . pravastatin (PRAVACHOL) 40 MG tablet Take 1 tablet by mouth once daily (Patient taking differently: Take 40 mg by mouth daily.) 90 tablet 0  . prochlorperazine (COMPAZINE) 10 MG tablet Take 1 tablet (10 mg total) by mouth every 6 (six) hours as needed (Nausea or vomiting). 30 tablet 1   No current facility-administered medications on file prior to visit.    Review of Systems: a complete, 10pt review of systems was completed with pertinent positives and negatives as documented in the HPI  Physical Exam: Afebrile, heart rate 63, blood pressure 100/60, BMI 24  Alert, well-appearing, appears younger than stated age Family respirations Abdomen soft and nontender There is a partially reducible large right inguinal hernia.  Review of most recent PET CT scan confirms that there is bowel within the hernia sac ascending in the scrotum.   CBC Latest Ref Rng & Units 07/19/2020 07/09/2020 06/17/2020  WBC 4.0 - 10.5 K/uL 4.7 14.4(H) 5.5  Hemoglobin 13.0 - 17.0 g/dL 12.6(L) 12.7(L) 12.0(L)  Hematocrit 39.0 - 52.0 % 36.7(L) 37.8(L) 36.2(L)  Platelets 150 - 400 K/uL 146(L) 167 147(L)    CMP Latest Ref Rng & Units 07/19/2020 07/09/2020 06/17/2020  Glucose 70 - 99 mg/dL 97 103(H) 108(H)  BUN 8 - 23 mg/dL _0 Creatinine 0.61 - 1.24 mg/dL 0.80 0.78  0.82  Sodium 135 - 145 mmol/L 140 140 141  Potassium 3.5 - 5.1 mmol/L 4.0 3.7 3.5  Chloride 98 - 111 mmol/L 103 102 103  CO2 22 - 32 mmol/L _1 Calcium 8.9 - 10.3 mg/dL 8.9 8.9 8.5(L)  Total Protein 6.5 - 8.1 g/dL 6.9 6.6 6.7  Total Bilirubin 0.3 - 1.2 mg/dL 0.5 1.1 0.4  Alkaline Phos 38 - 126 U/L 63 71 61  AST 15 - 41 U/L _2 ALT 0 - 44 U/L _3 Lab Results  Component Value Date   INR 1.0 02/23/2020   INR 1.0 07/30/2018    Imaging: No  results found.   A/P: RIGHT INGUINAL HERNIA (K40.90) Story: Large hernia containing bowel with recent history of incarceration episode. I recommend proceeding with an open repair with mesh. We discussed the relevant anatomy and we discussed the technique of the procedure. Discussed risks of bleeding, infection, pain, scarring, injury to structures in the area including nerves, blood vessels, bowel, bladder, risk of chronic pain, hernia recurrence, risk of seroma or hematoma, urinary retention, and risks of general anesthesia including cardiovascular, pulmonary, and thromboembolic complications. Questions were answered. I have sent a message to Dr. Irene Limbo to confirm this is an ok window regarding his chemotherapy, and hopefully can schedule within the next 1-2 weeks.    Patient Active Problem List   Diagnosis Date Noted  . Atherosclerosis of aorta ([place]) 03/16/2020  . Mantle cell lymphoma ([place]) 03/16/2020  . Counseling regarding advance care planning and goals of care 03/16/2020  . Port-A-Cath in place 09/16/2018  . Diffuse large B-cell lymphoma of lymph nodes of neck ([place]) 07/26/2018  . History of renal stone 08/24/2016  . Hypertension 12/21/2010  . Hyperlipidemia with target LDL less than 100 12/21/2010  . History of prostate cancer 12/21/2010       Romana Juniper, MD [facility]  See AMION to contact appropriate on-call provider

## 2020-08-25 NOTE — H&P (View-Only) (Signed)
Surgical Evaluation Requesting provider: Dr. Irene Limbo Chief Complaint: hernia  HPI: Very pleasant 87 year old man presents with a one-year history of a partially reducible right groin bulge.  Not particularly painful and denies any specific GI symptoms but he did have an episode of incarceration requiring a visit to the emergency room recently.  He is actively undergoing therapy for diffuse large B-cell lymphoma and reports that he is essentially in a maintenance phase with his chemotherapy.  He has previously had a left inguinal hernia repair by Dr. Ottis Stain many years ago but no previous repair on the right.  Allergies  Allergen Reactions  . Lisinopril Cough  . Penicillins Rash    Did it involve swelling of the face/tongue/throat, SOB, or low BP? No Did it involve sudden or severe rash/hives, skin peeling, or any reaction on the inside of your mouth or nose? Yes Did you need to seek medical attention at a hospital or doctor's office? Yes When did it last happen?50years If all above answers are "NO", may proceed with cephalosporin use.    Past Medical History:  Diagnosis Date  . Cancer ([place])    PROSTATE  . History of colonic polyps   . History of kidney stones   . Hypertension   . Inguinal hernia 03/2002  . Pneumonia    "years ago"    Past Surgical History:  Procedure Laterality Date  . COLONOSCOPY    . DIRECT LARYNGOSCOPY N/A 07/08/2018   Procedure: DIRECT LARYNGOSCOPY;  Surgeon: Leta Baptist, MD;  Location: [facility];  Service: ENT;  Laterality: N/A;  . ESOPHAGOSCOPY N/A 07/08/2018   Procedure: ESOPHAGOSCOPY;  Surgeon: Leta Baptist, MD;  Location: [facility];  Service: ENT;  Laterality: N/A;  . FLEXIBLE BRONCHOSCOPY N/A 07/08/2018   Procedure: FLEXIBLE BRONCHOSCOPY;  Surgeon: Leta Baptist, MD;  Location: [facility];  Service: ENT;  Laterality: N/A;  . HERNIA REPAIR Left    inguinal hernia  . IR IMAGING GUIDED PORT INSERTION  07/30/2018  . MASS BIOPSY Right 07/08/2018   Procedure: RIGHT NECK MASS  EXCISIONAL BIOPSY;  Surgeon: Leta Baptist, MD;  Location: [facility];  Service: ENT;  Laterality: Right;  . PATELLA FRACTURE SURGERY Left   . PROSTATE SURGERY  10/2005   RADIAL PROSTATECTOMY  . ROTATOR CUFF REPAIR Bilateral     Family History  Problem Relation Age of Onset  . Cancer Father   . Cancer Brother     Social History   Socioeconomic History  . Marital status: Married    Spouse name: Not on file  . Number of children: Not on file  . Years of education: Not on file  . Highest education level: Not on file  Occupational History  . Not on file  Tobacco Use  . Smoking status: Former Research scientist (life sciences)  . Smokeless tobacco: Never Used  . Tobacco comment: stopped at the age of  42  Vaping Use  . Vaping Use: Never used  Substance and Sexual Activity  . Alcohol use: Yes    Alcohol/week: 6.0 standard drinks    Types: 6 Standard drinks or equivalent per week  . Drug use: No  . Sexual activity: Not Currently  Other Topics Concern  . Not on file  Social History Narrative  . Not on file   Social Determinants of Health   Financial Resource Strain: Not on file  Food Insecurity: Not on file  Transportation Needs: Not on file  Physical Activity: Not on file  Stress: Not on file  Social Connections: Not on  file    Current Outpatient Medications on File Prior to Visit  Medication Sig Dispense Refill  . acyclovir (ZOVIRAX) 400 MG tablet TAKE 1 TABLET BY MOUTH TWICE A DAY 180 tablet 1  . aspirin-sod bicarb-citric acid (ALKA-SELTZER) 325 MG TBEF tablet Take 650 mg by mouth every 6 (six) hours as needed (congestion).    Marland Kitchen dexamethasone (DECADRON) 4 MG tablet Take 2 tablets (8 mg total) by mouth daily. Start the day after bendamustine chemotherapy for 2 days. Take with food. 30 tablet 1  . doxycycline (VIBRAMYCIN) 100 MG capsule Take 100 mg by mouth 2 (two) times daily.    Marland Kitchen latanoprost (XALATAN) 0.005 % ophthalmic solution Place 1 drop into both eyes at bedtime.    . lidocaine-prilocaine (EMLA)  cream Apply to affected area once (Patient taking differently: Apply 1 application topically as needed (port access). Apply to affected area once) 30 g 3  . LORazepam (ATIVAN) 0.5 MG tablet Take 1 tablet (0.5 mg total) by mouth every 6 (six) hours as needed (Nausea or vomiting). 30 tablet 0  . losartan-hydrochlorothiazide (HYZAAR) 50-12.5 MG tablet TAKE 1 TABLET BY MOUTH EVERY DAY 90 tablet 0  . Multiple Vitamins-Minerals (ICAPS AREDS 2 PO) Take 1 tablet by mouth daily.    . ondansetron (ZOFRAN) 8 MG tablet Take 1 tablet (8 mg total) by mouth 2 (two) times daily as needed for refractory nausea / vomiting. Start on day 2 after bendamustine chemo. 30 tablet 1  . pravastatin (PRAVACHOL) 40 MG tablet Take 1 tablet by mouth once daily (Patient taking differently: Take 40 mg by mouth daily.) 90 tablet 0  . prochlorperazine (COMPAZINE) 10 MG tablet Take 1 tablet (10 mg total) by mouth every 6 (six) hours as needed (Nausea or vomiting). 30 tablet 1   No current facility-administered medications on file prior to visit.    Review of Systems: a complete, 10pt review of systems was completed with pertinent positives and negatives as documented in the HPI  Physical Exam: Afebrile, heart rate 63, blood pressure 100/60, BMI 24  Alert, well-appearing, appears younger than stated age Family respirations Abdomen soft and nontender There is a partially reducible large right inguinal hernia.  Review of most recent PET CT scan confirms that there is bowel within the hernia sac ascending in the scrotum.   CBC Latest Ref Rng & Units 07/19/2020 07/09/2020 06/17/2020  WBC 4.0 - 10.5 K/uL 4.7 14.4(H) 5.5  Hemoglobin 13.0 - 17.0 g/dL 12.6(L) 12.7(L) 12.0(L)  Hematocrit 39.0 - 52.0 % 36.7(L) 37.8(L) 36.2(L)  Platelets 150 - 400 K/uL 146(L) 167 147(L)    CMP Latest Ref Rng & Units 07/19/2020 07/09/2020 06/17/2020  Glucose 70 - 99 mg/dL 97 103(H) 108(H)  BUN 8 - 23 mg/dL _0 Creatinine 0.61 - 1.24 mg/dL 0.80 0.78  0.82  Sodium 135 - 145 mmol/L 140 140 141  Potassium 3.5 - 5.1 mmol/L 4.0 3.7 3.5  Chloride 98 - 111 mmol/L 103 102 103  CO2 22 - 32 mmol/L _1 Calcium 8.9 - 10.3 mg/dL 8.9 8.9 8.5(L)  Total Protein 6.5 - 8.1 g/dL 6.9 6.6 6.7  Total Bilirubin 0.3 - 1.2 mg/dL 0.5 1.1 0.4  Alkaline Phos 38 - 126 U/L 63 71 61  AST 15 - 41 U/L _2 ALT 0 - 44 U/L _3 Lab Results  Component Value Date   INR 1.0 02/23/2020   INR 1.0 07/30/2018    Imaging: No  results found.   A/P: RIGHT INGUINAL HERNIA (K40.90) Story: Large hernia containing bowel with recent history of incarceration episode. I recommend proceeding with an open repair with mesh. We discussed the relevant anatomy and we discussed the technique of the procedure. Discussed risks of bleeding, infection, pain, scarring, injury to structures in the area including nerves, blood vessels, bowel, bladder, risk of chronic pain, hernia recurrence, risk of seroma or hematoma, urinary retention, and risks of general anesthesia including cardiovascular, pulmonary, and thromboembolic complications. Questions were answered. I have sent a message to Dr. Irene Limbo to confirm this is an ok window regarding his chemotherapy, and hopefully can schedule within the next 1-2 weeks.    Patient Active Problem List   Diagnosis Date Noted  . Atherosclerosis of aorta ([place]) 03/16/2020  . Mantle cell lymphoma ([place]) 03/16/2020  . Counseling regarding advance care planning and goals of care 03/16/2020  . Port-A-Cath in place 09/16/2018  . Diffuse large B-cell lymphoma of lymph nodes of neck ([place]) 07/26/2018  . History of renal stone 08/24/2016  . Hypertension 12/21/2010  . Hyperlipidemia with target LDL less than 100 12/21/2010  . History of prostate cancer 12/21/2010       Romana Juniper, MD [facility]  See AMION to contact appropriate on-call provider

## 2020-08-27 NOTE — Patient Instructions (Signed)
DUE TO COVID-19 ONLY ONE VISITOR IS ALLOWED TO COME WITH YOU AND STAY IN THE WAITING ROOM ONLY DURING PRE OP AND PROCEDURE DAY OF SURGERY. THE 1 VISITOR  MAY VISIT WITH YOU AFTER SURGERY IN YOUR PRIVATE ROOM DURING VISITING HOURS ONLY!                  GIAN YBARRA   Your procedure is scheduled on: 09/03/20   Report to [facility] Main  Entrance   Report to Short stay at 5:15 AM     Call this number if you have problems the morning of surgery [phone]    Remember: Do not eat food or drink liquids :After Midnight.   BRUSH YOUR TEETH MORNING OF SURGERY AND RINSE YOUR MOUTH OUT, NO CHEWING GUM CANDY OR MINTS.     Take these medicines the morning of surgery with A SIP OF WATER: Acyclovir                                You may not have any metal on your body including              piercings  Do not wear jewelry,  lotions, powders or deodorant              Men may shave face and neck.   Do not bring valuables to the hospital. [place].  Contacts, dentures or bridgework may not be worn into surgery.      Patients discharged the day of surgery will not be allowed to drive home.  IF YOU ARE HAVING SURGERY AND GOING HOME THE SAME DAY, YOU MUST HAVE AN ADULT TO DRIVE YOU HOME AND BE WITH YOU FOR 24 HOURS. YOU MAY GO HOME BY TAXI OR UBER OR ORTHERWISE, BUT AN ADULT MUST ACCOMPANY YOU HOME AND STAY WITH YOU FOR 24 HOURS.  Name and phone number of your driver:  Special Instructions: N/A              Please read over the following fact sheets you were given: _____________________________________________________________________             [facility] - Preparing for Surgery Before surgery, you can play an important role.  Because skin is not sterile, your skin needs to be as free of germs as possible.  You can reduce the number of germs on your skin by washing with CHG (chlorahexidine gluconate) soap before surgery.  CHG  is an antiseptic cleaner which kills germs and bonds with the skin to continue killing germs even after washing. Please DO NOT use if you have an allergy to CHG or antibacterial soaps.  If your skin becomes reddened/irritated stop using the CHG and inform your nurse when you arrive at Short Stay.   You may shave your face/neck.  Please follow these instructions carefully:  1.  Shower with CHG Soap the night before surgery and the  morning of Surgery.  2.  If you choose to wash your hair, wash your hair first as usual with your  normal  shampoo.  3.  After you shampoo, rinse your hair and body thoroughly to remove the  shampoo.  4.  Use CHG as you would any other liquid soap.  You can apply chg directly  to the skin and wash                       Gently with a scrungie or clean washcloth.  5.  Apply the CHG Soap to your body ONLY FROM THE NECK DOWN.   Do not use on face/ open                           Wound or open sores. Avoid contact with eyes, ears mouth and genitals (private parts).                       Wash face,  Genitals (private parts) with your normal soap.             6.  Wash thoroughly, paying special attention to the area where your surgery  will be performed.  7.  Thoroughly rinse your body with warm water from the neck down.  8.  DO NOT shower/wash with your normal soap after using and rinsing off  the CHG Soap.             9.  Pat yourself dry with a clean towel.            10.  Wear clean pajamas.            11.  Place clean sheets on your bed the night of your first shower and do not  sleep with pets. Day of Surgery : Do not apply any lotions/deodorants the morning of surgery.  Please wear clean clothes to the hospital/surgery center.  FAILURE TO FOLLOW THESE INSTRUCTIONS MAY RESULT IN THE CANCELLATION OF YOUR SURGERY PATIENT SIGNATURE_________________________________  NURSE  SIGNATURE__________________________________  ________________________________________________________________________

## 2020-08-30 ENCOUNTER — Encounter (HOSPITAL_COMMUNITY)
Admission: RE | Admit: 2020-08-30 | Discharge: 2020-08-30 | Disposition: A | Discharge status: Home / Self Care | Payer: Medicare Other | Source: Ambulatory Visit | Attending: Surgery | Admitting: Surgery

## 2020-08-30 ENCOUNTER — Other Ambulatory Visit: Payer: Self-pay

## 2020-08-30 ENCOUNTER — Encounter (HOSPITAL_COMMUNITY): Payer: Self-pay

## 2020-08-30 DIAGNOSIS — Z01818 Encounter for other preprocedural examination: Secondary | ICD-10-CM | POA: Diagnosis not present

## 2020-08-30 HISTORY — DX: Unspecified osteoarthritis, unspecified site: M19.90

## 2020-08-30 LAB — CBC
HCT: 38.5 % — ABNORMAL LOW (ref 39.0–52.0)
Hemoglobin: 13.1 g/dL (ref 13.0–17.0)
MCH: 31.5 pg (ref 26.0–34.0)
MCHC: 34 g/dL (ref 30.0–36.0)
MCV: 92.5 fL (ref 80.0–100.0)
Platelets: 128 10*3/uL — ABNORMAL LOW (ref 150–400)
RBC: 4.16 MIL/uL — ABNORMAL LOW (ref 4.22–5.81)
RDW: 13.1 % (ref 11.5–15.5)
WBC: 5 10*3/uL (ref 4.0–10.5)
nRBC: 0 % (ref 0.0–0.2)

## 2020-08-30 LAB — BASIC METABOLIC PANEL
Anion gap: 7 (ref 5–15)
BUN: 15 mg/dL (ref 8–23)
CO2: 28 mmol/L (ref 22–32)
Calcium: 9.2 mg/dL (ref 8.9–10.3)
Chloride: 103 mmol/L (ref 98–111)
Creatinine, Ser: 0.96 mg/dL (ref 0.61–1.24)
GFR, Estimated: >60 mL/min (ref >60)
Glucose, Bld: 105 mg/dL — ABNORMAL HIGH (ref 70–99)
Potassium: 3.9 mmol/L (ref 3.5–5.1)
Sodium: 138 mmol/L (ref 135–145)

## 2020-08-30 NOTE — Progress Notes (Signed)
COVID Vaccine Completed:Yes Date COVID Vaccine completed:06/03/19-boosters 12/11/19,01/2020 COVID vaccine manufacturer: Pfizer     PCP - Dr. Glo Herring Cardiologist - none  Chest x-ray - 05/28/18-epic EKG - 08/30/20-chart,epic Stress Test - no ECHO - 07/17/18-epic Cardiac Cath - no Pacemaker/ICD device last checked:NA  Sleep Study -  CPAP -   Fasting Blood Sugar -  Checks Blood Sugar _____ times a day  Blood Thinner Instructions:none Aspirin Instructions: Last Dose:  Anesthesia review:   Patient denies shortness of breath, fever, cough and chest pain at PAT appointment Yes. Pt gets SOB after chemo treatments. He has mantle cell lymphoma. Next treatment will be in July.  Patient verbalized understanding of instructions that were given to them at the PAT appointment. Patient was also instructed that they will need to review over the PAT instructions again at home before surgery.yes

## 2020-09-01 ENCOUNTER — Encounter (HOSPITAL_COMMUNITY): Payer: Self-pay | Admitting: Emergency Medicine

## 2020-09-01 ENCOUNTER — Other Ambulatory Visit: Payer: Self-pay

## 2020-09-01 ENCOUNTER — Emergency Department (HOSPITAL_COMMUNITY)
Admission: EM | Admit: 2020-09-01 | Discharge: 2020-09-02 | Disposition: A | Discharge status: Home / Self Care | Payer: Medicare Other | Attending: Emergency Medicine | Admitting: Emergency Medicine

## 2020-09-01 DIAGNOSIS — Z8546 Personal history of malignant neoplasm of prostate: Secondary | ICD-10-CM | POA: Insufficient documentation

## 2020-09-01 DIAGNOSIS — Z79899 Other long term (current) drug therapy: Secondary | ICD-10-CM | POA: Insufficient documentation

## 2020-09-01 DIAGNOSIS — R531 Weakness: Secondary | ICD-10-CM | POA: Diagnosis not present

## 2020-09-01 DIAGNOSIS — I10 Essential (primary) hypertension: Secondary | ICD-10-CM | POA: Insufficient documentation

## 2020-09-01 DIAGNOSIS — K4030 Unilateral inguinal hernia, with obstruction, without gangrene, not specified as recurrent: Secondary | ICD-10-CM

## 2020-09-01 DIAGNOSIS — R1030 Lower abdominal pain, unspecified: Secondary | ICD-10-CM | POA: Diagnosis present

## 2020-09-01 DIAGNOSIS — Z87891 Personal history of nicotine dependence: Secondary | ICD-10-CM | POA: Diagnosis not present

## 2020-09-01 DIAGNOSIS — K4090 Unilateral inguinal hernia, without obstruction or gangrene, not specified as recurrent: Secondary | ICD-10-CM | POA: Diagnosis not present

## 2020-09-01 NOTE — ED Triage Notes (Signed)
Pt BIB [facility] c/o right lower groin hernia. Pt states hernia large in size and painful. Has been able to reduce at home x3 today. Schedule surgery Fri 6/10.

## 2020-09-02 DIAGNOSIS — K4090 Unilateral inguinal hernia, without obstruction or gangrene, not specified as recurrent: Secondary | ICD-10-CM | POA: Diagnosis not present

## 2020-09-02 MED ORDER — ONDANSETRON HCL 4 MG/2ML IJ SOLN
4.0000 mg | Freq: Once | INTRAMUSCULAR | Status: AC
Start: 2020-09-02 — End: 2020-09-02
  Administered 2020-09-02: 4 mg via INTRAVENOUS
  Filled 2020-09-02: qty 2

## 2020-09-02 MED ORDER — MORPHINE SULFATE (PF) 4 MG/ML IV SOLN
4.0000 mg | Freq: Once | INTRAVENOUS | Status: AC
Start: 2020-09-02 — End: 2020-09-02
  Administered 2020-09-02: 4 mg via INTRAVENOUS
  Filled 2020-09-02: qty 1

## 2020-09-02 MED ORDER — BUPIVACAINE LIPOSOME 1.3 % IJ SUSP
20.0000 mL | Freq: Once | INTRAMUSCULAR | Status: DC
  Filled 2020-09-02: qty 20

## 2020-09-02 NOTE — Discharge Instructions (Addendum)
Follow-up with your surgeon as planned.  No heavy lifting or strenuous activity until then.  Return to the emergency department if symptoms significantly worsen or change.

## 2020-09-02 NOTE — ED Notes (Signed)
Discharge instructions discussed with pt. Pt was able to verbalize understanding with no questions at this time. Pt to wait at lobby for wife.

## 2020-09-02 NOTE — ED Notes (Signed)
Successful reduction of right groin hernia performed by MD Delo.

## 2020-09-02 NOTE — ED Provider Notes (Signed)
[facility] DEPT Provider Note   CSN: [id number] Arrival date & time: 09/01/20  2302     History Chief Complaint  Patient presents with   Hernia    Mario Garcia is a 87 y.o. male.  Patient is an 87 year old male with history of hypertension, mantle cell lymphoma, and right-sided inguinal hernia.  He presents today for evaluation of right groin pain and swelling.  Patient states that his hernia normally is reducible, however this evening came out and he cannot push it back in.  He denies any vomiting, but does feel nauseated.  Patient has seen Dr. Windle Guard and is scheduled to have his hernia repaired in 2 days.  The history is provided by the patient.      Past Medical History:  Diagnosis Date   Arthritis    Cancer ([place])    PROSTATE   History of colonic polyps    History of kidney stones    Hypertension    Inguinal hernia 03/2002   Pneumonia    "years ago"    Patient Active Problem List   Diagnosis Date Noted   Atherosclerosis of aorta ([place]) 03/16/2020   Mantle cell lymphoma ([place]) 03/16/2020   Counseling regarding advance care planning and goals of care 03/16/2020   Port-A-Cath in place 09/16/2018   Diffuse large B-cell lymphoma of lymph nodes of neck ([place]) 07/26/2018   History of renal stone 08/24/2016   Hypertension 12/21/2010   Hyperlipidemia with target LDL less than 100 12/21/2010   History of prostate cancer 12/21/2010    Past Surgical History:  Procedure Laterality Date   COLONOSCOPY     DIRECT LARYNGOSCOPY N/A 07/08/2018   Procedure: DIRECT LARYNGOSCOPY;  Surgeon: Leta Baptist, MD;  Location: [facility];  Service: ENT;  Laterality: N/A;   ESOPHAGOSCOPY N/A 07/08/2018   Procedure: ESOPHAGOSCOPY;  Surgeon: Leta Baptist, MD;  Location: [facility];  Service: ENT;  Laterality: N/A;   FLEXIBLE BRONCHOSCOPY N/A 07/08/2018   Procedure: FLEXIBLE BRONCHOSCOPY;  Surgeon: Leta Baptist, MD;  Location: [facility];  Service: ENT;  Laterality: N/A;   HERNIA REPAIR Left     inguinal hernia   IR IMAGING GUIDED PORT INSERTION  07/30/2018   MASS BIOPSY Right 07/08/2018   Procedure: RIGHT NECK MASS EXCISIONAL BIOPSY;  Surgeon: Leta Baptist, MD;  Location: [facility] OR;  Service: ENT;  Laterality: Right;   PATELLA FRACTURE SURGERY Left    PROSTATE SURGERY  10/2005   RADIAL PROSTATECTOMY   ROTATOR CUFF REPAIR Bilateral        Family History  Problem Relation Age of Onset   Cancer Father    Cancer Brother     Social History   Tobacco Use   Smoking status: Former    Pack years: 0.00   Smokeless tobacco: Never   Tobacco comments:    stopped at the age of  7  Vaping Use   Vaping Use: Never used  Substance Use Topics   Alcohol use: Not Currently    Alcohol/week: 6.0 standard drinks    Types: 6 Standard drinks or equivalent per week   Drug use: No    Home Medications Prior to Admission medications   Medication Sig Start Date End Date Taking? Authorizing Provider  acyclovir (ZOVIRAX) 400 MG tablet TAKE 1 TABLET BY MOUTH TWICE A DAY Patient taking differently: Take 400 mg by mouth 2 (two) times daily. 07/23/20   Brunetta Genera, MD  dexamethasone (DECADRON) 4 MG tablet Take 2 tablets (8 mg total)  by mouth daily. Start the day after bendamustine chemotherapy for 2 days. Take with food. Patient not taking: Reported on 08/26/2020 03/16/20   Brunetta Genera, MD  doxycycline (VIBRAMYCIN) 100 MG capsule Take 100 mg by mouth 2 (two) times daily. Patient not taking: Reported on 08/26/2020 06/30/20   [provider]  latanoprost (XALATAN) 0.005 % ophthalmic solution Place 1 drop into both eyes at bedtime.    [provider]  lidocaine-prilocaine (EMLA) cream Apply to affected area once Patient taking differently: Apply 1 application topically as needed (port access). Apply to affected area once 03/16/20   Brunetta Genera, MD  LORazepam (ATIVAN) 0.5 MG tablet Take 1 tablet (0.5 mg total) by mouth every 6 (six) hours as needed (Nausea or  vomiting). Patient not taking: Reported on 08/26/2020 03/16/20   Brunetta Genera, MD  losartan-hydrochlorothiazide (HYZAAR) 50-12.5 MG tablet TAKE 1 TABLET BY MOUTH EVERY DAY Patient taking differently: Take 1 tablet by mouth daily. 07/21/20   Denita Lung, MD  Multiple Vitamins-Minerals (ICAPS AREDS 2 PO) Take 1 tablet by mouth daily.    [provider]  ondansetron (ZOFRAN) 8 MG tablet Take 1 tablet (8 mg total) by mouth 2 (two) times daily as needed for refractory nausea / vomiting. Start on day 2 after bendamustine chemo. Patient not taking: Reported on 08/26/2020 03/16/20   Brunetta Genera, MD  pravastatin (PRAVACHOL) 40 MG tablet Take 1 tablet by mouth once daily Patient taking differently: Take 40 mg by mouth daily. 06/15/20   Denita Lung, MD  prochlorperazine (COMPAZINE) 10 MG tablet Take 1 tablet (10 mg total) by mouth every 6 (six) hours as needed (Nausea or vomiting). Patient not taking: Reported on 08/26/2020 03/16/20   Brunetta Genera, MD    Allergies    Lisinopril and Penicillins  Review of Systems   Review of Systems  All other systems reviewed and are negative.  Physical Exam Updated Vital Signs BP 129/67   Pulse (!) 58   Temp 97.8 F (36.6 C) (Oral)   Resp 12   Ht _0  (1.753 m)   Wt 72.1 kg   SpO2 95%   BMI 23.48 kg/m   Physical Exam Vitals and nursing note reviewed.  Constitutional:      General: He is not in acute distress.    Appearance: He is well-developed. He is not diaphoretic.  HENT:     Head: Normocephalic and atraumatic.  Cardiovascular:     Rate and Rhythm: Normal rate and regular rhythm.     Heart sounds: No murmur heard.   No friction rub.  Pulmonary:     Effort: Pulmonary effort is normal. No respiratory distress.     Breath sounds: Normal breath sounds. No wheezing or rales.  Abdominal:     General: Bowel sounds are normal. There is no distension.     Palpations: Abdomen is soft.     Tenderness: There is no  abdominal tenderness.     Comments: There is a palpable right inguinal hernia noted.  Musculoskeletal:        General: Normal range of motion.     Cervical back: Normal range of motion and neck supple.  Skin:    General: Skin is warm and dry.  Neurological:     Mental Status: He is alert and oriented to person, place, and time.     Coordination: Coordination normal.    ED Results / Procedures / Treatments   Labs (all labs ordered  are listed, but only abnormal results are displayed) Labs Reviewed - No data to display  EKG None  Radiology No results found.  Procedures Hernia reduction  Date/Time: 09/02/2020 2:26 AM Performed by: Veryl Speak, MD Authorized by: Veryl Speak, MD  Consent: Verbal consent obtained. Written consent not obtained. Risks and benefits: risks, benefits and alternatives were discussed Consent given by: patient Patient understanding: patient states understanding of the procedure being performed Patient consent: the patient's understanding of the procedure matches consent given Procedure consent: procedure consent matches procedure scheduled Relevant documents: relevant documents present and verified Test results: test results available and properly labeled Site marked: the operative site was not marked Imaging studies: imaging studies not available Patient identity confirmed: verbally with patient and arm band Time out: Immediately prior to procedure a "time out" was called to verify the correct patient, procedure, equipment, support staff and site/side marked as required. Local anesthesia used: no  Anesthesia: Local anesthesia used: no  Sedation: Patient sedated: no  Patient tolerance: patient tolerated the procedure well with no immediate complications Comments: Patient given morphine and Zofran and hernia was reduced successfully.  Patient tolerated this well.     Medications Ordered in ED Medications  ondansetron (ZOFRAN) injection 4 mg  (4 mg Intravenous Given 09/02/20 0106)  morphine 4 MG/ML injection 4 mg (4 mg Intravenous Given 09/02/20 0107)    ED Course  I have reviewed the triage vital signs and the nursing notes.  Pertinent labs & imaging results that were available during my care of the patient were reviewed by me and considered in my medical decision making (see chart for details).    MDM Rules/Calculators/A&P  Patient presenting with pain and swelling in his right groin.  He has a known inguinal hernia which is to be repaired in 2 days.  Patient unable to reduce the hernia this evening and presents for evaluation of this.  After receiving morphine and Zofran, I was able to successfully reduce the hernia.  Patient tolerated this well.  He seems appropriate for discharge with follow-up for surgery as scheduled.  Final Clinical Impression(s) / ED Diagnoses Final diagnoses:  None    Rx / DC Orders ED Discharge Orders     None        Veryl Speak, MD 09/02/20 [phone]

## 2020-09-03 ENCOUNTER — Encounter (HOSPITAL_COMMUNITY)
Admission: RE | Disposition: A | Discharge status: Home / Self Care | Payer: Self-pay | Source: Ambulatory Visit | Attending: Surgery

## 2020-09-03 ENCOUNTER — Encounter (HOSPITAL_COMMUNITY): Payer: Self-pay | Admitting: Surgery

## 2020-09-03 ENCOUNTER — Ambulatory Visit (HOSPITAL_COMMUNITY)
Admission: RE | Admit: 2020-09-03 | Discharge: 2020-09-03 | Disposition: A | Discharge status: Home / Self Care | Payer: Medicare Other | Source: Ambulatory Visit | Attending: Surgery | Admitting: Surgery

## 2020-09-03 ENCOUNTER — Ambulatory Visit (HOSPITAL_COMMUNITY): Payer: Medicare Other | Admitting: Physician Assistant

## 2020-09-03 ENCOUNTER — Ambulatory Visit (HOSPITAL_COMMUNITY): Payer: Medicare Other | Admitting: Anesthesiology

## 2020-09-03 DIAGNOSIS — C8338 Diffuse large B-cell lymphoma, lymph nodes of multiple sites: Secondary | ICD-10-CM | POA: Insufficient documentation

## 2020-09-03 DIAGNOSIS — Z79899 Other long term (current) drug therapy: Secondary | ICD-10-CM | POA: Insufficient documentation

## 2020-09-03 DIAGNOSIS — Z86010 Personal history of colonic polyps: Secondary | ICD-10-CM | POA: Insufficient documentation

## 2020-09-03 DIAGNOSIS — Z888 Allergy status to other drugs, medicaments and biological substances status: Secondary | ICD-10-CM | POA: Diagnosis not present

## 2020-09-03 DIAGNOSIS — Z9079 Acquired absence of other genital organ(s): Secondary | ICD-10-CM | POA: Diagnosis not present

## 2020-09-03 DIAGNOSIS — Z880 Allergy status to penicillin: Secondary | ICD-10-CM | POA: Diagnosis not present

## 2020-09-03 DIAGNOSIS — Z87442 Personal history of urinary calculi: Secondary | ICD-10-CM | POA: Diagnosis not present

## 2020-09-03 DIAGNOSIS — M1990 Unspecified osteoarthritis, unspecified site: Secondary | ICD-10-CM | POA: Diagnosis not present

## 2020-09-03 DIAGNOSIS — I700 Atherosclerosis of aorta: Secondary | ICD-10-CM | POA: Insufficient documentation

## 2020-09-03 DIAGNOSIS — Z7189 Other specified counseling: Secondary | ICD-10-CM

## 2020-09-03 DIAGNOSIS — Z8719 Personal history of other diseases of the digestive system: Secondary | ICD-10-CM | POA: Diagnosis not present

## 2020-09-03 DIAGNOSIS — E785 Hyperlipidemia, unspecified: Secondary | ICD-10-CM | POA: Diagnosis not present

## 2020-09-03 DIAGNOSIS — Z809 Family history of malignant neoplasm, unspecified: Secondary | ICD-10-CM | POA: Diagnosis not present

## 2020-09-03 DIAGNOSIS — C8318 Mantle cell lymphoma, lymph nodes of multiple sites: Secondary | ICD-10-CM

## 2020-09-03 DIAGNOSIS — Z8546 Personal history of malignant neoplasm of prostate: Secondary | ICD-10-CM | POA: Insufficient documentation

## 2020-09-03 DIAGNOSIS — I10 Essential (primary) hypertension: Secondary | ICD-10-CM | POA: Diagnosis not present

## 2020-09-03 DIAGNOSIS — K4090 Unilateral inguinal hernia, without obstruction or gangrene, not specified as recurrent: Secondary | ICD-10-CM | POA: Insufficient documentation

## 2020-09-03 DIAGNOSIS — Z87891 Personal history of nicotine dependence: Secondary | ICD-10-CM | POA: Diagnosis not present

## 2020-09-03 HISTORY — PX: INGUINAL HERNIA REPAIR: SHX194

## 2020-09-03 SURGERY — REPAIR, HERNIA, INGUINAL, ADULT
Anesthesia: General | Laterality: Right

## 2020-09-03 MED ORDER — DOCUSATE SODIUM 100 MG PO CAPS: 100.0000 mg | ORAL_CAPSULE | Freq: Two times a day (BID) | ORAL | 0 refills | Status: AC

## 2020-09-03 MED ORDER — CHLORHEXIDINE GLUCONATE 4 % EX LIQD: 60.0000 mL | Freq: Once | CUTANEOUS | Status: DC

## 2020-09-03 MED ORDER — ACETAMINOPHEN 10 MG/ML IV SOLN: 1000.0000 mg | Freq: Once | INTRAVENOUS | Status: DC | PRN

## 2020-09-03 MED ORDER — ONDANSETRON HCL 4 MG/2ML IJ SOLN
INTRAMUSCULAR | Status: AC
  Filled 2020-09-03: qty 2

## 2020-09-03 MED ORDER — AMISULPRIDE (ANTIEMETIC) 5 MG/2ML IV SOLN: 10.0000 mg | Freq: Once | INTRAVENOUS | Status: DC | PRN

## 2020-09-03 MED ORDER — CHLORHEXIDINE GLUCONATE 0.12 % MT SOLN
15.0000 mL | Freq: Once | OROMUCOSAL | Status: AC
  Administered 2020-09-03: 15 mL via OROMUCOSAL

## 2020-09-03 MED ORDER — ACETAMINOPHEN 160 MG/5ML PO SOLN: 325.0000 mg | ORAL | Status: DC | PRN

## 2020-09-03 MED ORDER — BUPIVACAINE LIPOSOME 1.3 % IJ SUSP
INTRAMUSCULAR | Status: DC | PRN
  Administered 2020-09-03: 20 mL

## 2020-09-03 MED ORDER — CEFAZOLIN SODIUM-DEXTROSE 2-4 GM/100ML-% IV SOLN
2.0000 g | INTRAVENOUS | Status: AC
  Administered 2020-09-03: 2 g via INTRAVENOUS
  Filled 2020-09-03: qty 100

## 2020-09-03 MED ORDER — PROPOFOL 10 MG/ML IV BOLUS
INTRAVENOUS | Status: AC
  Filled 2020-09-03: qty 20

## 2020-09-03 MED ORDER — LIDOCAINE 2% (20 MG/ML) 5 ML SYRINGE
INTRAMUSCULAR | Status: AC
  Filled 2020-09-03: qty 5

## 2020-09-03 MED ORDER — FENTANYL CITRATE (PF) 100 MCG/2ML IJ SOLN
INTRAMUSCULAR | Status: AC
  Filled 2020-09-03: qty 2

## 2020-09-03 MED ORDER — OXYCODONE HCL 5 MG PO TABS: 5.0000 mg | ORAL_TABLET | Freq: Once | ORAL | Status: DC | PRN

## 2020-09-03 MED ORDER — DEXAMETHASONE SODIUM PHOSPHATE 10 MG/ML IJ SOLN
INTRAMUSCULAR | Status: AC
  Filled 2020-09-03: qty 1

## 2020-09-03 MED ORDER — FENTANYL CITRATE (PF) 100 MCG/2ML IJ SOLN
INTRAMUSCULAR | Status: DC | PRN
  Administered 2020-09-03: 100 ug via INTRAVENOUS

## 2020-09-03 MED ORDER — ROCURONIUM BROMIDE 10 MG/ML (PF) SYRINGE
PREFILLED_SYRINGE | INTRAVENOUS | Status: AC
  Filled 2020-09-03: qty 10

## 2020-09-03 MED ORDER — PHENYLEPHRINE 40 MCG/ML (10ML) SYRINGE FOR IV PUSH (FOR BLOOD PRESSURE SUPPORT)
PREFILLED_SYRINGE | INTRAVENOUS | Status: DC | PRN
  Administered 2020-09-03: 40 ug via INTRAVENOUS

## 2020-09-03 MED ORDER — EPHEDRINE SULFATE-NACL 50-0.9 MG/10ML-% IV SOSY
PREFILLED_SYRINGE | INTRAVENOUS | Status: DC | PRN
  Administered 2020-09-03 (×3): 10 mg via INTRAVENOUS

## 2020-09-03 MED ORDER — BUPIVACAINE-EPINEPHRINE 0.25% -1:200000 IJ SOLN
INTRAMUSCULAR | Status: DC | PRN
  Administered 2020-09-03: 30 mL

## 2020-09-03 MED ORDER — SUCCINYLCHOLINE CHLORIDE 200 MG/10ML IV SOSY
PREFILLED_SYRINGE | INTRAVENOUS | Status: AC
  Filled 2020-09-03: qty 10

## 2020-09-03 MED ORDER — PROPOFOL 10 MG/ML IV BOLUS
INTRAVENOUS | Status: DC | PRN
  Administered 2020-09-03: 130 mg via INTRAVENOUS

## 2020-09-03 MED ORDER — ACETAMINOPHEN 325 MG PO TABS: 325.0000 mg | ORAL_TABLET | ORAL | Status: DC | PRN

## 2020-09-03 MED ORDER — ACETAMINOPHEN 500 MG PO TABS
1000.0000 mg | ORAL_TABLET | ORAL | Status: AC
  Administered 2020-09-03: 1000 mg via ORAL
  Filled 2020-09-03: qty 2

## 2020-09-03 MED ORDER — FENTANYL CITRATE (PF) 100 MCG/2ML IJ SOLN: 25.0000 ug | INTRAMUSCULAR | Status: DC | PRN

## 2020-09-03 MED ORDER — PHENYLEPHRINE 40 MCG/ML (10ML) SYRINGE FOR IV PUSH (FOR BLOOD PRESSURE SUPPORT)
PREFILLED_SYRINGE | INTRAVENOUS | Status: AC
  Filled 2020-09-03: qty 10

## 2020-09-03 MED ORDER — ROCURONIUM BROMIDE 10 MG/ML (PF) SYRINGE
PREFILLED_SYRINGE | INTRAVENOUS | Status: DC | PRN
  Administered 2020-09-03: 10 mg via INTRAVENOUS
  Administered 2020-09-03: 50 mg via INTRAVENOUS
  Administered 2020-09-03: 10 mg via INTRAVENOUS

## 2020-09-03 MED ORDER — LIDOCAINE 2% (20 MG/ML) 5 ML SYRINGE
INTRAMUSCULAR | Status: DC | PRN
  Administered 2020-09-03: 40 mg via INTRAVENOUS

## 2020-09-03 MED ORDER — EPHEDRINE 5 MG/ML INJ
INTRAVENOUS | Status: AC
  Filled 2020-09-03: qty 10

## 2020-09-03 MED ORDER — LACTATED RINGERS IV SOLN: INTRAVENOUS | Status: DC

## 2020-09-03 MED ORDER — BUPIVACAINE-EPINEPHRINE 0.25% -1:200000 IJ SOLN
INTRAMUSCULAR | Status: AC
  Filled 2020-09-03: qty 1

## 2020-09-03 MED ORDER — DEXAMETHASONE SODIUM PHOSPHATE 10 MG/ML IJ SOLN
INTRAMUSCULAR | Status: DC | PRN
  Administered 2020-09-03: 4 mg via INTRAVENOUS

## 2020-09-03 MED ORDER — SUGAMMADEX SODIUM 200 MG/2ML IV SOLN
INTRAVENOUS | Status: DC | PRN
  Administered 2020-09-03: 200 mg via INTRAVENOUS

## 2020-09-03 MED ORDER — TRAMADOL HCL 50 MG PO TABS: 50.0000 mg | ORAL_TABLET | Freq: Four times a day (QID) | ORAL | 0 refills | Status: DC | PRN

## 2020-09-03 MED ORDER — OXYCODONE HCL 5 MG/5ML PO SOLN: 5.0000 mg | Freq: Once | ORAL | Status: DC | PRN

## 2020-09-03 MED ORDER — ONDANSETRON HCL 4 MG/2ML IJ SOLN
INTRAMUSCULAR | Status: DC | PRN
  Administered 2020-09-03: 4 mg via INTRAVENOUS

## 2020-09-03 MED ORDER — PROMETHAZINE HCL 25 MG/ML IJ SOLN: 6.2500 mg | INTRAMUSCULAR | Status: DC | PRN

## 2020-09-03 MED ORDER — ORAL CARE MOUTH RINSE: 15.0000 mL | Freq: Once | OROMUCOSAL | Status: AC

## 2020-09-03 SURGICAL SUPPLY — 41 items
APL PRP STRL LF DISP 70% ISPRP (MISCELLANEOUS) ×1
BENZOIN TINCTURE PRP APPL 2/3 (GAUZE/BANDAGES/DRESSINGS) ×3 IMPLANT
BLADE SURG 15 STRL LF DISP TIS (BLADE) ×1 IMPLANT
BLADE SURG 15 STRL SS (BLADE) ×3
CHLORAPREP W/TINT 26 (MISCELLANEOUS) ×3 IMPLANT
CLOSURE WOUND 1/2 X4 (GAUZE/BANDAGES/DRESSINGS) ×1
COVER SURGICAL LIGHT HANDLE (MISCELLANEOUS) ×3 IMPLANT
COVER WAND RF STERILE (DRAPES) IMPLANT
DECANTER SPIKE VIAL GLASS SM (MISCELLANEOUS) ×3 IMPLANT
DRAIN PENROSE 0.5X18 (DRAIN) ×3 IMPLANT
DRAPE LAPAROSCOPIC ABDOMINAL (DRAPES) ×3 IMPLANT
DRSG TEGADERM 4X4.75 (GAUZE/BANDAGES/DRESSINGS) ×3 IMPLANT
ELECT REM PT RETURN 15FT ADLT (MISCELLANEOUS) ×3 IMPLANT
GAUZE SPONGE 4X4 12PLY STRL (GAUZE/BANDAGES/DRESSINGS) ×3 IMPLANT
GLOVE SURG ENC MOIS LTX SZ6 (GLOVE) ×3 IMPLANT
GLOVE SURG MICRO LTX SZ6 (GLOVE) ×3 IMPLANT
GLOVE SURG UNDER LTX SZ6.5 (GLOVE) ×3 IMPLANT
GOWN STRL REUS W/TWL LRG LVL3 (GOWN DISPOSABLE) ×3 IMPLANT
GOWN STRL REUS W/TWL XL LVL3 (GOWN DISPOSABLE) ×3 IMPLANT
KIT BASIN OR (CUSTOM PROCEDURE TRAY) ×3 IMPLANT
KIT TURNOVER KIT A (KITS) ×3 IMPLANT
MESH ULTRAPRO 3X6 7.6X15CM (Mesh General) ×3 IMPLANT
NEEDLE HYPO 22GX1.5 SAFETY (NEEDLE) ×3 IMPLANT
PACK BASIC VI WITH GOWN DISP (CUSTOM PROCEDURE TRAY) ×3 IMPLANT
PENCIL SMOKE EVACUATOR (MISCELLANEOUS) ×3 IMPLANT
SPONGE LAP 4X18 RFD (DISPOSABLE) ×3 IMPLANT
STRIP CLOSURE SKIN 1/2X4 (GAUZE/BANDAGES/DRESSINGS) ×2 IMPLANT
SUT ETHIBOND 0 MO6 C/R (SUTURE) ×3 IMPLANT
SUT MNCRL AB 4-0 PS2 18 (SUTURE) ×3 IMPLANT
SUT PDS AB 0 CT1 36 (SUTURE) ×6 IMPLANT
SUT SILK 3 0 (SUTURE) ×3
SUT SILK 3-0 18XBRD TIE 12 (SUTURE) ×1 IMPLANT
SUT VIC AB 3-0 SH 27 (SUTURE) ×6
SUT VIC AB 3-0 SH 27XBRD (SUTURE) ×2 IMPLANT
SUT VICRYL 0 UR6 27IN ABS (SUTURE) IMPLANT
SUT VICRYL 3 0 BR 18  UND (SUTURE) ×3
SUT VICRYL 3 0 BR 18 UND (SUTURE) ×1 IMPLANT
SYR CONTROL 10ML LL (SYRINGE) ×3 IMPLANT
TOWEL OR 17X26 10 PK STRL BLUE (TOWEL DISPOSABLE) ×3 IMPLANT
TOWEL OR NON WOVEN STRL DISP B (DISPOSABLE) ×3 IMPLANT
TRAY FOLEY MTR SLVR 16FR STAT (SET/KITS/TRAYS/PACK) ×3 IMPLANT

## 2020-09-03 NOTE — Anesthesia Postprocedure Evaluation (Signed)
Anesthesia Post Note  Patient: Mario Garcia  Procedure(s) Performed: OPEN RIGHT INGUINAL HERNIA REPAIR (Right)     Patient location during evaluation: PACU Anesthesia Type: General Level of consciousness: awake and alert Pain management: pain level controlled Vital Signs Assessment: post-procedure vital signs reviewed and stable Respiratory status: spontaneous breathing, nonlabored ventilation, respiratory function stable and patient connected to nasal cannula oxygen Cardiovascular status: blood pressure returned to baseline and stable Postop Assessment: no apparent nausea or vomiting Anesthetic complications: no   No notable events documented.  Last Vitals:  Vitals:   09/03/20 0930 09/03/20 0940  BP: (!) 121/52 (!) 125/59  Pulse: (!) 59 61  Resp: 14 14  Temp:  36.5 C  SpO2: 90% 94%    Last Pain:  Vitals:   09/03/20 0940  TempSrc:   PainSc: 0-No pain                 Effie Berkshire

## 2020-09-03 NOTE — Discharge Instructions (Signed)
HERNIA REPAIR: POST OP INSTRUCTIONS   EAT Gradually transition to a high fiber diet with a fiber supplement over the next few weeks after discharge.  Start with a pureed / full liquid diet (see below)  WALK Walk an hour a day (cumulative- not all at once).  Control your pain to do that.    CONTROL PAIN Control pain so that you can walk, sleep, tolerate sneezing/coughing, and go up/down stairs.  HAVE A BOWEL MOVEMENT DAILY Keep your bowels regular to avoid problems.  OK to try a laxative to override constipation.  OK to use an antidairrheal to slow down diarrhea.  Call if not better after 2 tries  CALL IF YOU HAVE PROBLEMS/CONCERNS Call if you are still struggling despite following these instructions. Call if you have concerns not answered by these instructions  ######################################################################    DIET: Follow a light bland diet & liquids the first 24 hours after arrival home, such as soup, liquids, starches, etc.  Be sure to drink plenty of fluids.  Quickly advance to a usual solid diet within a few days.  Avoid fast food or heavy meals as your are more likely to get nauseated or have irregular bowels.  A low-sugar, high-fiber diet for the rest of your life is ideal.   Take your usually prescribed home medications unless otherwise directed.  PAIN CONTROL: Pain is best controlled by a usual combination of three different methods TOGETHER: Ice/Heat Over the counter pain medication Prescription pain medication Most patients will experience some swelling and bruising around the hernia(s) such as the bellybutton, groins, or old incisions.  Ice packs or heating pads (30-60 minutes up to 6 times a day) will help. Use ice for the first few days to help decrease swelling and bruising, then switch to heat to help relax tight/sore spots and speed recovery.  Some people prefer to use ice alone, heat alone, alternating between ice & heat.  Experiment to what  works for you.  Swelling and bruising can take several weeks to resolve.   It is helpful to take an over-the-counter pain medication regularly for the first days: Naproxen (Aleve, etc)  Two 220mg  tabs twice a day OR Ibuprofen (Advil, etc) Three 200mg  tabs four times a day (every meal & bedtime) AND Acetaminophen (Tylenol, etc) 325-650mg  four times a day (every meal & bedtime) A  prescription for pain medication should be given to you upon discharge.  Take your pain medication as prescribed, IF NEEDED.  If you are having problems/concerns with the prescription medicine (does not control pain, nausea, vomiting, rash, itching, etc), please call us [phone] to see if we need to switch you to a different pain medicine that will work better for you and/or control your side effect better. If you need a refill on your pain medication, please contact your pharmacy.  They will contact our office to request authorization. Prescriptions will not be filled after 5 pm or on week-ends.  Avoid getting constipated.  Between the surgery and the pain medications, it is common to experience some constipation.  Increasing fluid intake and taking a fiber supplement (such as Metamucil, Citrucel, FiberCon, MiraLax, etc) 1-2 times a day regularly will usually help prevent this problem from occurring.  A mild laxative (prune juice, Milk of Magnesia, MiraLax, etc) should be taken according to package directions if there are no bowel movements after 48 hours.    Wash / shower every day, starting 2 days after surgery.  You may shower over the  steri strips which are waterproof.    Remove your outer bandage 2 days after surgery.  You may leave the incision open to air.  You may replace a dressing/Band-Aid to cover an incision for comfort if you wish.  Continue to shower over incision(s) after the dressing is off.  ACTIVITIES as tolerated:   You may resume regular (light) daily activities beginning the next day--such as daily  self-care, walking, climbing stairs--gradually increasing activities as tolerated.  Control your pain so that you can walk an hour a day.  If you can walk 30 minutes without difficulty, it is safe to try more intense activity such as jogging, treadmill, bicycling, low-impact aerobics, swimming, etc. Refrain from the most intensive and strenuous activity such as sit-ups, heavy lifting, contact sports, etc  Refrain from any heavy lifting or straining until 6 weeks after surgery.   DO NOT PUSH THROUGH PAIN.  Let pain be your guide: If it hurts to do something, don't do it.  Pain is your body warning you to avoid that activity for another week until the pain goes down. You may drive when you are no longer taking prescription pain medication, you can comfortably wear a seatbelt, and you can safely maneuver your car and apply brakes. You may have sexual intercourse when it is comfortable.   FOLLOW UP in our office Please call [facility] at [phone] to set up an appointment to see your surgeon in the office for a follow-up appointment approximately 2-3 weeks after your surgery. Make sure that you call for this appointment the day you arrive home to insure a convenient appointment time.  9.  If you have disability of FMLA / Family leave forms, please bring the forms to the office for processing.  (do not give to your surgeon).  WHEN TO CALL us [phone]: Poor pain control Reactions / problems with new medications (rash/itching, nausea, etc)  Fever over 101.5 F (38.5 C) Inability to urinate Nausea and/or vomiting Worsening swelling or bruising Continued bleeding from incision. Increased pain, redness, or drainage from the incision   The clinic staff is available to answer your questions during regular business hours (8:30am-5pm).  Please don't hesitate to call and ask to speak to one of our nurses for clinical concerns.   If you have a medical emergency, go to the nearest emergency room or call  911.  A surgeon from [facility] is always on call at the hospitals in [facility], [place], [place], [place], [place]  [address] ?  [address] [address], [place], [place]   [address] MAIN: [phone] ? TOLL FREE: [phone] ? FAX: [phone] [email or web address]

## 2020-09-03 NOTE — Anesthesia Preprocedure Evaluation (Addendum)
Anesthesia Evaluation  Patient identified by MRN, date of birth, ID band Patient awake    Reviewed: Allergy & Precautions, NPO status , Patient's Chart, lab work & pertinent test results  Airway Mallampati: II  TM Distance: >3 FB Neck ROM: Full    Dental  (+) Missing,    Pulmonary former smoker,    breath sounds clear to auscultation       Cardiovascular hypertension, Pt. on medications  Rhythm:Regular Rate:Normal     Neuro/Psych negative neurological ROS  negative psych ROS   GI/Hepatic negative GI ROS, Neg liver ROS,   Endo/Other  negative endocrine ROS  Renal/GU negative Renal ROS     Musculoskeletal  (+) Arthritis ,   Abdominal Normal abdominal exam  (+)   Peds  Hematology negative hematology ROS (+)   Anesthesia Other Findings   Reproductive/Obstetrics                            Anesthesia Physical Anesthesia Plan  ASA: 3  Anesthesia Plan: General   Post-op Pain Management:    Induction: Intravenous  PONV Risk Score and Plan: 3 and Ondansetron, Dexamethasone and Midazolam  Airway Management Planned: Oral ETT  Additional Equipment: None  Intra-op Plan:   Post-operative Plan: Extubation in OR  Informed Consent: I have reviewed the patients History and Physical, chart, labs and discussed the procedure including the risks, benefits and alternatives for the proposed anesthesia with the patient or authorized representative who has indicated his/her understanding and acceptance.       Plan Discussed with: CRNA  Anesthesia Plan Comments:       Anesthesia Quick Evaluation

## 2020-09-03 NOTE — Anesthesia Procedure Notes (Signed)
Procedure Name: Intubation Date/Time: 09/03/2020 7:35 AM Performed by: Niel Hummer, CRNA Pre-anesthesia Checklist: Patient identified, Emergency Drugs available, Patient being monitored and Suction available Patient Re-evaluated:Patient Re-evaluated prior to induction Oxygen Delivery Method: Circle system utilized Preoxygenation: Pre-oxygenation with 100% oxygen Induction Type: IV induction Ventilation: Mask ventilation without difficulty Laryngoscope Size: Mac and 3 Grade View: Grade II Tube type: Oral Tube size: 7.5 mm Number of attempts: 1 Airway Equipment and Method: Stylet Placement Confirmation: ETT inserted through vocal cords under direct vision, positive ETCO2 and breath sounds checked- equal and bilateral Secured at: 22 cm Tube secured with: Tape Dental Injury: Teeth and Oropharynx as per pre-operative assessment

## 2020-09-03 NOTE — Op Note (Signed)
Operative Note  THAMAS APPLEYARD  [id number]  [id number]  09/03/2020   Surgeon: Clovis Riley MD FACS   Procedure performed: Open inguinal hernia repair with UltraPro mesh: right indirect   Preop diagnosis:  right inguinal hernia with multiple episodes of incarceration requiring emergency room visits   Post-op diagnosis/intraop findings:  Large indirect right inguinal hernia, distal sac abandoned in the scrotum.   Specimens: none   EBL: 5cc   Complications: none   Description of procedure: After confirming informed consent the patient was taken to the operating room and placed supine on operating room table where general anesthesia was initiated, preoperative antibiotics were administered, SCDs applied, and a formal timeout was performed.  A Foley catheter was placed which is removed at the end of the case.  The groin was clipped, prepped and draped in the usual sterile fashion. An oblique incision was made in the just above the inguinal ligament after infiltrating the tissues with local anesthetic (Exparel mixed with quarter percent Marcaine with epinephrine). Soft tissues were dissected using electrocautery until the external oblique aponeurosis was encountered. This was divided sharply to expand the external ring. A plane was bluntly developed between the spermatic cord and the external oblique. The spermatic cord was then bluntly dissected away from the pubic tubercle and encircled with a Penrose. Inspection of the inguinal anatomy revealed a large indirect sac extending into the scrotum and intimately adherent to the cord structures.  The sac was very thin and did tear while trying to dissected away from the cord structures.  This opening was used to confirm the anatomy and that this is in fact the peritoneal sac and the lumen communicates with the peritoneal cavity.  The proximal sac was further dissected away from the cord structures down to the level of the internal ring. The sac was  clamped and the excess excised at the level of the internal ring, where the remaining sac was suture ligated with a 0 vicryl pursestring and reduced into the abdomen.  The distal sac which was quite adherent within the scrotum was left in situ but the majority of it was excised once able to be freed from the cord structures.  0 Vicryl's were then used to narrow the internal ring on either side of the spermatic cord.  A 3 x 6 piece of ultra Pro mesh was brought onto the field and trimmed to approximate the field. This was sutured to the pubic tubercle fascia using 0 ethibond. Interrupted 0 ethibonds were then used to suture the mesh to the inferior shelving edge and to the internal oblique superiorly. The tails of the mesh were wrapped around the spermatic cord, ensuring adequate room for the cord, and sutured to each other with 0 ethibond, and then directed laterally to lie flat beneath the external oblique aponeurosis.  An additional suture was used to secure the mesh to the inguinal floor medial to the cord. Hemostasis was ensured within the wound. The Penrose was removed. The external oblique aponeurosis was reapproximated with a running 3-0 Vicryl to re-create a narrowed external ring. More local was infiltrated around the pubic tubercle and in the plane just below the external oblique. The Scarpa's was reapproximated with interrupted 3-0 Vicryls. The skin was closed with a running subcuticular Monocryl. The remainder of the local was injected in the subcutaneous and subcuticular space. The field was then cleaned, benzoin and Steri-Strips and sterile bandage were applied. Both testicles were palpated in the scrotum at the end of  the case. The patient was then awakened extubated and taken to PACU in stable condition.    All counts were correct at the completion of the case

## 2020-09-03 NOTE — Transfer of Care (Signed)
Immediate Anesthesia Transfer of Care Note  Patient: Mario Garcia  Procedure(s) Performed: OPEN RIGHT INGUINAL HERNIA REPAIR (Right)  Patient Location: PACU  Anesthesia Type:General  Level of Consciousness: awake, alert , oriented and patient cooperative  Airway & Oxygen Therapy: Patient Spontanous Breathing and Patient connected to face mask oxygen  Post-op Assessment: Report given to RN, Post -op Vital signs reviewed and stable and Patient moving all extremities  Post vital signs: Reviewed and stable  Last Vitals:  Vitals Value Taken Time  BP    Temp    Pulse 64 09/03/20 0902  Resp 13 09/03/20 0902  SpO2 100 % 09/03/20 0902  Vitals shown include unvalidated device data.  Last Pain:  Vitals:   09/03/20 0600  TempSrc: Oral  PainSc:          Complications: No notable events documented.

## 2020-09-03 NOTE — Interval H&P Note (Signed)
History and Physical Interval Note:  09/03/2020 6:54 AM  Mario Garcia  has presented today for surgery, with the diagnosis of INGUINAL HERNIA.  The various methods of treatment have been discussed with the patient and family. After consideration of risks, benefits and other options for treatment, the patient has consented to  Procedure(s): OPEN RIGHT INGUINAL HERNIA REPAIR (Right) as a surgical intervention.  The patient's history has been reviewed, patient examined, no change in status, stable for surgery.  I have reviewed the patient's chart and labs.  Questions were answered to the patient's satisfaction.     [name] Rich Brave

## 2020-09-06 ENCOUNTER — Encounter (HOSPITAL_COMMUNITY): Payer: Self-pay | Admitting: Surgery

## 2020-09-20 ENCOUNTER — Other Ambulatory Visit: Payer: Self-pay | Admitting: Family Medicine

## 2020-09-20 DIAGNOSIS — E785 Hyperlipidemia, unspecified: Secondary | ICD-10-CM

## 2020-09-26 DIAGNOSIS — Z20822 Contact with and (suspected) exposure to covid-19: Secondary | ICD-10-CM | POA: Diagnosis not present

## 2020-10-01 ENCOUNTER — Ambulatory Visit (HOSPITAL_COMMUNITY)
Admission: RE | Admit: 2020-10-01 | Discharge: 2020-10-01 | Disposition: A | Discharge status: Home / Self Care | Payer: Medicare Other | Source: Ambulatory Visit | Attending: Hematology | Admitting: Hematology

## 2020-10-01 ENCOUNTER — Other Ambulatory Visit: Payer: Self-pay

## 2020-10-01 DIAGNOSIS — I700 Atherosclerosis of aorta: Secondary | ICD-10-CM | POA: Diagnosis not present

## 2020-10-01 DIAGNOSIS — C8310 Mantle cell lymphoma, unspecified site: Secondary | ICD-10-CM | POA: Diagnosis not present

## 2020-10-01 DIAGNOSIS — C8318 Mantle cell lymphoma, lymph nodes of multiple sites: Secondary | ICD-10-CM | POA: Insufficient documentation

## 2020-10-01 DIAGNOSIS — I2510 Atherosclerotic heart disease of native coronary artery without angina pectoris: Secondary | ICD-10-CM | POA: Insufficient documentation

## 2020-10-01 LAB — GLUCOSE, CAPILLARY: Glucose-Capillary: 95 mg/dL (ref 70–99)

## 2020-10-01 MED ORDER — FLUDEOXYGLUCOSE F - 18 (FDG) INJECTION
8.2000 | Freq: Once | INTRAVENOUS | Status: AC
  Administered 2020-10-01: 7.77 via INTRAVENOUS

## 2020-10-10 NOTE — Progress Notes (Signed)
HEMATOLOGY/ONCOLOGY CLINIC NOTE  Date of Service: 10/11/2020  Patient Care Team: Denita Lung, MD as PCP - General (Family Medicine)  CHIEF COMPLAINTS/PURPOSE OF CONSULTATION:  History of diffuse Large B-Cell Lymphoma Follow-up for continued management of mantle cell lymphoma  HISTORY OF PRESENTING ILLNESS:   Mario Garcia is a wonderful 87 y.o. male who has been referred to [place] by Dr. Jill Alexanders for evaluation and management of Diffuse Large B-Cell Lymphoma. The pt reports that he is doing well overall.   The pt reports that he feels that he has been a little more tired recently. He first noticed some "nodules" on his neck appear "4-6 weeks ago." He notes that these nodules haven't been painful. He appears to have presented to care with his PCP on 05/28/18, Dr. Redmond School. He notes that he has noticed that he has been sweating more in the last year. He denies noticing any other new lumps or bumps. He denies any fevers, chills, drenching night sweats, or unexpected weight loss. He notes that he has had some changes in swallowing over the last 6 months, which he characterizes as "getting strangled on his saliva every now and then." He denies abdominal pains, acid reflux, changes in bowel habits, leg swelling, skin rashes. He endorses a deep pain "every once in a while," in his left groin. He denies headaches. He endorses glaucoma and a history of cataracts. He sees Dr. Hetty Blend in ophthalmology. He denies any other concern in the last 6 months.  The pt notes that he lives independently with his wife and still is able to do all he wants to do. He walks on trails with his wife and notes that he can generally walk as far as he likes to.  The pt notes that he had prostate cancer in 2008 or 2009, s/p prostatectomy. He did not require RT nor systemic treatments. He denies lung, heart or kidney problems.  Of note prior to the patient's visit today, pt has had a CT Neck completed on 06/05/18  with results revealing Pathologic RIGHT-sided level II, III, IV, and V lymphadenopathy, most consistent with metastatic carcinoma. An obvious primary source is not identified. Tissue sampling is warranted.  Most recent lab results (07/08/18) of CBC and CMP is as follows: all values are WNL except for PLT at 129k.  On review of systems, pt reports slightly more tired, neck nodules, some changes in swallowing, staying active, and denies fevers, chills, drenching night sweats, unexpected weight loss, abdominal pains, changes in bowel habits, skin rashes, leg swelling, CP, SOB, difficulty breathing, headaches, other lumps or bumps, skin lesions, mouth sores, pain along the spine, back pain, leg swelling, and any other symptoms.   On PMHx the pt reports localized Prostate cancer in 2008 s/p prostatectomy.  On Social Hx the pt reports that he quit smoking over 50 years ago. He notes that he consumes about 1 glass of wine every day, without concerns for excessive drinking. 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 He denies concern for chemical or radiation exposure. On Family Hx the pt reports wife with Stage IV Non Hodgkin's Lymphoma, paternal grandmother with leukemia in her 29s, father with unspecified abdominal cancer.   INTERVAL HISTORY:   Mario Garcia returns today for management and evaluation of his mantle cell lymphoma.  The patient's last visit with [place] was on 07/19/2020. The pt reports that he is doing well overall. We are joined today by  his wife.   The pt had his open inguinal hernia repair on 09/03/2020 by Dr. Kae Heller.   The pt reports that he had his hernia surgery and is feeling much improved after. The patient has recovered well. He notes no new symptoms or concerns. He has developed some lightheadedness, starting before the surgery. He notes a lightheaded feeling, not spinning. He has been drinking plenty fluids. He notes no major medication changes.  The pt  has had PET Skull Base to Thigh ([id number]) on 10/01/2020, which revealed "Overall assessment as Deauville criteria 5 with generalized increase in metabolic activity associated with lymph nodes which show little change in size in the neck, chest, abdomen and pelvis. Lesion at the site of metabolic activity in the liver with increasing metabolic activity likely related to hepatic involvement. Marked increase in thickening of the gallbladder fundus adjacent to the above finding. This is not clearly hypermetabolic accounting for some respiratory variation but is suspicious given enlargement. Consider MRI with without contrast or contrasted CT for more complete assessment of the above findings. No signs of increased metabolism in the spleen.Post RIGHT inguinal herniorrhaphy."  Lab results today 10/11/2020 of CBC w/diff and CMP is as follows: all values are WNL except for RBC of 4.20, Hgb of 12.9, Hct of 38.2, Plt of 135K.  On review of systems, pt reports lightheadedness when looking up and moving, imbalance and denies changes in hearing, fevers, chills, night sweats, throat soreness, abdominal pain, and any other symptoms.   MEDICAL HISTORY:  Past Medical History:  Diagnosis Date   Arthritis    Cancer ([place])    PROSTATE   History of colonic polyps    History of kidney stones    Hypertension    Inguinal hernia 03/2002   Pneumonia    "years ago"    SURGICAL HISTORY: Past Surgical History:  Procedure Laterality Date   COLONOSCOPY     DIRECT LARYNGOSCOPY N/A 07/08/2018   Procedure: DIRECT LARYNGOSCOPY;  Surgeon: Leta Baptist, MD;  Location: [facility];  Service: ENT;  Laterality: N/A;   ESOPHAGOSCOPY N/A 07/08/2018   Procedure: ESOPHAGOSCOPY;  Surgeon: Leta Baptist, MD;  Location: [facility];  Service: ENT;  Laterality: N/A;   FLEXIBLE BRONCHOSCOPY N/A 07/08/2018   Procedure: FLEXIBLE BRONCHOSCOPY;  Surgeon: Leta Baptist, MD;  Location: [facility];  Service: ENT;  Laterality: N/A;   HERNIA REPAIR Left    inguinal hernia    INGUINAL HERNIA REPAIR Right 09/03/2020   Procedure: OPEN RIGHT INGUINAL HERNIA REPAIR;  Surgeon: Clovis Riley, MD;  Location: [facility];  Service: General;  Laterality: Right;   IR IMAGING GUIDED PORT INSERTION  07/30/2018   MASS BIOPSY Right 07/08/2018   Procedure: RIGHT NECK MASS EXCISIONAL BIOPSY;  Surgeon: Leta Baptist, MD;  Location: [facility];  Service: ENT;  Laterality: Right;   PATELLA FRACTURE SURGERY Left    PROSTATE SURGERY  10/2005   RADIAL PROSTATECTOMY   ROTATOR CUFF REPAIR Bilateral     SOCIAL HISTORY: Social History   Socioeconomic History   Marital status: Married    Spouse name: Not on file   Number of children: Not on file   Years of education: Not on file   Highest education level: Not on file  Occupational History   Not on file  Tobacco Use   Smoking status: Former   Smokeless tobacco: Never   Tobacco comments:    stopped at the age of  50  Vaping Use   Vaping Use: Never used  Substance  and Sexual Activity   Alcohol use: Not Currently    Alcohol/week: 6.0 standard drinks    Types: 6 Standard drinks or equivalent per week   Drug use: No   Sexual activity: Not Currently  Other Topics Concern   Not on file  Social History Narrative   Not on file   Social Determinants of Health   Financial Resource Strain: Not on file  Food Insecurity: Not on file  Transportation Needs: Not on file  Physical Activity: Not on file  Stress: Not on file  Social Connections: Not on file  Intimate Partner Violence: Not on file    FAMILY HISTORY: Family History  Problem Relation Age of Onset   Cancer Father    Cancer Brother     ALLERGIES:  is allergic to lisinopril and penicillins.  MEDICATIONS:  Current Outpatient Medications  Medication Sig Dispense Refill   acyclovir (ZOVIRAX) 400 MG tablet TAKE 1 TABLET BY MOUTH TWICE A DAY (Patient taking differently: Take 400 mg by mouth 2 (two) times daily.) 180 tablet 1   latanoprost (XALATAN) 0.005 % ophthalmic solution  Place 1 drop into both eyes at bedtime.     lidocaine-prilocaine (EMLA) cream Apply to affected area once (Patient taking differently: Apply 1 application topically as needed (port access). Apply to affected area once) 30 g 3   losartan-hydrochlorothiazide (HYZAAR) 50-12.5 MG tablet TAKE 1 TABLET BY MOUTH EVERY DAY (Patient taking differently: Take 1 tablet by mouth daily.) 90 tablet 0   Multiple Vitamins-Minerals (ICAPS AREDS 2 PO) Take 1 tablet by mouth daily.     pravastatin (PRAVACHOL) 40 MG tablet Take 1 tablet by mouth once daily 90 tablet 0   traMADol (ULTRAM) 50 MG tablet Take 1 tablet (50 mg total) by mouth every 6 (six) hours as needed. 15 tablet 0   No current facility-administered medications for this visit.    REVIEW OF SYSTEMS:   10 Point review of Systems was done is negative except as noted above.  PHYSICAL EXAMINATION: ECOG FS:1 - Symptomatic but completely ambulatory  Vitals:   10/11/20 1014  BP: (!) 147/76  Pulse: 68  Resp: 18  Temp: 98.6 F (37 C)  SpO2: 98%    Wt Readings from Last 3 Encounters:  10/11/20 160 lb 11.2 oz (72.9 kg)  09/03/20 158 lb 15.2 oz (72.1 kg)  09/01/20 159 lb (72.1 kg)   Body mass index is 23.73 kg/m.     GENERAL:alert, in no acute distress and comfortable SKIN: no acute rashes, no significant lesions EYES: conjunctiva are pink and non-injected, sclera anicteric OROPHARYNX: MMM, no exudates, no oropharyngeal erythema or ulceration NECK: supple, no JVD LYMPH:  no palpable lymphadenopathy in the cervical, axillary or inguinal regions LUNGS: clear to auscultation b/l with normal respiratory effort HEART: regular rate & rhythm ABDOMEN:  normoactive bowel sounds , non tender, not distended. Extremity: no pedal edema PSYCH: alert & oriented x 3 with fluent speech NEURO: no focal motor/sensory deficits   LABORATORY DATA:  I have reviewed the data as listed  . CBC Latest Ref Rng & Units 10/11/2020 08/30/2020 07/19/2020  WBC 4.0 -  10.5 K/uL 5.7 5.0 4.7  Hemoglobin 13.0 - 17.0 g/dL 12.9(L) 13.1 12.6(L)  Hematocrit 39.0 - 52.0 % 38.2(L) 38.5(L) 36.7(L)  Platelets 150 - 400 K/uL 135(L) 128(L) 146(L)    . CMP Latest Ref Rng & Units 10/11/2020 08/30/2020 07/19/2020  Glucose 70 - 99 mg/dL 88 105(H) 97  BUN 8 - 23 mg/dL 13 15 13  Creatinine 0.61 - 1.24 mg/dL 0.91 0.96 0.80  Sodium 135 - 145 mmol/L 143 138 140  Potassium 3.5 - 5.1 mmol/L 4.0 3.9 4.0  Chloride 98 - 111 mmol/L 104 103 103  CO2 22 - 32 mmol/L _0 Calcium 8.9 - 10.3 mg/dL 9.2 9.2 8.9  Total Protein 6.5 - 8.1 g/dL 7.2 - 6.9  Total Bilirubin 0.3 - 1.2 mg/dL 0.4 - 0.5  Alkaline Phos 38 - 126 U/L 69 - 63  AST 15 - 41 U/L 17 - 18  ALT 0 - 44 U/L 12 - 13   . Lab Results  Component Value Date   LDH 164 07/19/2020   02/23/2020 Right Cervical Lymph Node Surgical Pathology [phone]):    02/23/2020 FISH Analysis:   02/23/2020 FISH Mutation Analysis:    03/03/2019 NM PET Image Restag (PS) Skull Base To Thigh (Accession [id number]):   07/08/18 Right Cervical Tissue Biopsy:    RADIOGRAPHIC STUDIES: I have personally reviewed the radiological images as listed and agreed with the findings in the report. NM PET Image Restag (PS) Skull Base To Thigh  Result Date: 10/01/2020 CLINICAL DATA:  Subsequent treatment strategy for mantle cell lymphoma in an 87 year old male. EXAM: NUCLEAR MEDICINE PET SKULL BASE TO THIGH TECHNIQUE: 7.77 mCi F-18 FDG was injected intravenously. Full-ring PET imaging was performed from the skull base to thigh after the radiotracer. CT data was obtained and used for attenuation correction and anatomic localization. Fasting blood glucose: 95 mg/dl COMPARISON:  Multiple prior studies most recent of June 16, 2020. FINDINGS: Mediastinal blood pool activity: SUV max 2.49 Liver activity: SUV max 3.1 NECK: Increase in bilateral tonsillar activity may be physiologic but is seen in the context of worsening of nodal disease elsewhere. LEFT  lingual tonsil with maximum SUV of 7.0. Scattered lymph nodes throughout the neck, for example a RIGHT level II lymph node with a maximum SUV of 3.4 previously with a maximum SUV of 3.0 (image 32/4) 6 mm. This is the dominant node in the neck and is little changed compared to the prior study. At the thoracic inlet however there is increased metabolic activity within an index node at the LEFT thoracic inlet (image 45/4) this lymph node measures 4 mm and is unchanged with respect to size but shows a maximum SUV of 3.7 previously 2.6. Similar SUV in the RIGHT thoracic inlet lymph node on image 45 of series 4 which is of similar size not previously shown to be hypermetabolic. Incidental CT findings: none CHEST: Increasing metabolism in LEFT axillary lymph nodes (image 50/4) maximum SUV of 7.3, previously 3.6 of a LEFT axillary lymph node measuring approximately 6 mm short axis, similar size compared to the previous study. Similar metabolism in a lymph node just lateral to this which is stable in terms of radiographic, CT appearance measuring a cm short axis but showed no increased metabolism on the most recent comparison RIGHT axillary lymph node (image 56/4) seen with fatty hilum measuring 1 cm short axis and stable in terms of CT appearance but with a maximum SUV of 5.0 Similar increase in metabolic activity of a small RIGHT axillary lymph node on image 56 of series 4 that measures approximately 6 mm with a maximum SUV of 5.8 previously shown to have metabolic activity less than mediastinal blood pool. Generalized mild increased metabolic activity within mediastinal lymph nodes most pronounced in subcarinal lymph nodes on image 72 of series 4 maximum SUV 4.8 previously at or less than mediastinal blood pool  but without change in terms of size compared to the previous exam at 0.6 cm short axis. Incidental CT findings: LEFT-sided Port-A-Cath in situ terminates at the caval to atrial junction. Aortic atherosclerosis as  before. No aneurysmal dilation. Normal caliber of the central pulmonary vessels. Mild cardiac enlargement with similar appearance to the prior study. Basilar atelectasis. Small nodules in the RIGHT chest unchanged as described above. No effusion or consolidation in the chest. Patulous esophagus. ABDOMEN/PELVIS: Increased metabolic activity about periportal lymph nodes maximum SUV on image 110 of series 4 of 5.9 likely localizing to a periportal lymph node on image 115 of the CT data set measuring approximately 0.6 cm short axis. RIGHT common iliac lymph node measuring 8 mm with a maximum SUV of 6.6 previously less activity than blood pool. Generalized increase in activity of lymph nodes in the pelvis. Index node in the LEFT hemipelvis as an example measures 8 mm as compared to 7 mm (image 176/4) maximum SUV of 11 as compared to 4 on the prior study. Similar appearance of RIGHT pelvic lymph nodes. Retroperitoneal lymph nodes also with increased metabolic activity since the prior study following other lymph nodes described above. Gallbladder wall thickening measuring 2.2 x 1.4 cm previously 0.7 cm greatest thickness. Adjacent to this in the medial segment of the LEFT hepatic lobe, hepatic subsegment IV is a 1.2 cm lesion with a maximum SUV of 7.7 (image 115/4) Incidental CT findings: Smooth liver contour. No pericholecystic stranding. Pancreas without sign of acute process. Normal size spleen. Adrenal glands are normal. No hydronephrosis. Collapsed urinary bladder. No perinephric stranding beyond what was present previously. No nephrolithiasis. No acute gastrointestinal process. Interval herniorrhaphy and return of small-bowel loops that were present in a RIGHT inguinal hernia into the abdomen. Appendix is normal. Aortic atherosclerosis. No aneurysmal dilation. Smooth contour of the IVC. Adenopathy and hypermetabolic lymph nodes as discussed above. Post prostatectomy. Signs of RIGHT inguinal herniorrhaphy. Mild  stranding about the site of hernia repair. No fluid collection. SKELETON: No focal hypermetabolic activity to suggest skeletal metastasis. Incidental CT findings: Spinal degenerative changes. IMPRESSION: Overall assessment as Deauville criteria 5 with generalized increase in metabolic activity associated with lymph nodes which show little change in size in the neck, chest, abdomen and pelvis. Lesion at the site of metabolic activity in the liver with increasing metabolic activity likely related to hepatic involvement. Marked increase in thickening of the gallbladder fundus adjacent to the above finding. This is not clearly hypermetabolic accounting for some respiratory variation but is suspicious given enlargement. Consider MRI with without contrast or contrasted CT for more complete assessment of the above findings. No signs of increased metabolism in the spleen. Post RIGHT inguinal herniorrhaphy. Aortic atherosclerosis. Coronary artery calcification. Aortic Atherosclerosis (ICD10-I70.0). Electronically Signed   By: Zetta Bills M.D.   On: 10/01/2020 17:40    ASSESSMENT & PLAN:   87 y.o. male with  1. H/o Diffuse Large B-Cell Lymphoma, Stage IV- now in remission Presenting without constitutional symptoms. Palpable right cervical and supraclavicular lymphadenopathy.  07/08/18 Right cervical soft tissue biopsy revealed Diffuse Large B-Cell Lymphoma, germinal center type  06/05/18 CT Neck revealed Pathologic RIGHT-sided level II, III, IV, and V lymphadenopathy, most consistent with metastatic carcinoma. An obvious primary source is not identified. Tissue sampling is warranted. 07/16/18 Hep B and Hep C negative 07/17/18 ECHO revealed LV EF of 60-65% 07/22/18 PET/CT revealed "Hypermetabolic adenopathy especially concentrated in the right neck but also in the left neck, chest, abdomen, and pelvis. The adenopathy  is primarily Deauville 5 although some few scattered smaller lesions are Deauville 4. 2. Diffuse  abnormal splenic activity, Deauville 5, without overt splenomegaly. 3. There is also hypermetabolic Deauville 5 activity in the mildly thickened distal appendix, and raising suspicion for involvement of the appendix. 4. Other imaging findings of potential clinical significance: Aortic Atherosclerosis. Coronary atherosclerosis." 10/03/2018 PET scan revealed "Interval response to therapy with stable to decreased size of lymph nodes on CT and generalized decrease in hypermetabolism of the abnormal nodes. Hypermetabolism in the lymph nodes today is compatible with a combination of Deauville 3 and Deauville 4 disease. Stable tiny bilateral pulmonary nodules. Increase radiotracer accumulation in the marrow space on today's study, presumably representing marrow stimulatory effects of therapy."  2. Currently with new Non-Hodgkin's B-cell Lymphoma - consistent with Mantle Cell 02/23/2020 Right Cervical Lymph Node Surgical Pathology [phone]) which revealed "Non-Hodgkin B-cell lymphoma. Ki-67 generally shows low expression (<5-15%)." 02/23/2020 FISH Mutation Analysis which revealed "t(11;14): DETECTED".   PLAN: -Discussed pt labwork today, 10/11/2020; chemistries completely normal, blood counts stable and close to normal. -Discussed pt PET Skull Base to Thigh ([id number]) on 10/01/2020; lymph nodes not changed in size, but increased brightness. -Recommended pt receive the second COVID booster shot as recently approved. Advised pt to wait 4-6 months following first booster shot before getting this. Would wait until after staring treatment. -Recommended pt get up slower and move legs before getting up. Can start wearing compression socks to help with blood flow. -Advised pt that some of the results could be inflammation from the surgery, especially in the abdomen. -Recommended pt check his baseline BP first thing in the morning prior to any movement or coffee. If low, pt will need to contact PCP for  medication adjustment. -Advised pt we need to define further a small nodule in the liver and thickening of the gall bladder. Will need to look at in detail. -Recommended starting on maintenance Rituxan in the next week. This will be q2month. Pt is scheduled for 07/25. -Will get MRI Liver in 4-6 days. -Will discuss addition of Ibrutinib after all results back if pt is no longer in remission. -Will see back in 1 week with labs.   FOLLOW UP: MRI Liver in 3 days F/u with Rituxan and labs as scheduled Plz add MD visit with Rituxan infusion to discuss MRI results ---can see in infusion    The total time spent in the appointment was 30 minutes and more than 50% was on counseling and direct patient cares.    All of the patient's questions were answered with apparent satisfaction. The patient knows to call the clinic with any problems, questions or concerns.   GSullivan LoneMD MWind PointAAHIVMS SRiver Valley Medical CenterCEncompass Health Rehabilitation Of PrHematology/Oncology Physician CKaiser Fnd Hosp - [facility] (Office):       [phone](Work cell):  [phone](Fax):           [phone]  I, RReinaldo Raddle am acting as scribe for Dr. GSullivan Lone MD .I have reviewed the above documentation for accuracy and completeness, and I agree with the above. .Brunetta GeneraMD

## 2020-10-11 ENCOUNTER — Other Ambulatory Visit: Payer: Self-pay

## 2020-10-11 ENCOUNTER — Inpatient Hospital Stay: Payer: Medicare Other

## 2020-10-11 ENCOUNTER — Inpatient Hospital Stay: Payer: Medicare Other | Attending: Hematology | Admitting: Hematology

## 2020-10-11 VITALS — BP 147/76 | HR 68 | Temp 98.6°F | Resp 18 | Ht 69.0 in | Wt 160.7 lb

## 2020-10-11 DIAGNOSIS — K769 Liver disease, unspecified: Secondary | ICD-10-CM | POA: Diagnosis not present

## 2020-10-11 DIAGNOSIS — Z79899 Other long term (current) drug therapy: Secondary | ICD-10-CM | POA: Insufficient documentation

## 2020-10-11 DIAGNOSIS — C8318 Mantle cell lymphoma, lymph nodes of multiple sites: Secondary | ICD-10-CM

## 2020-10-11 DIAGNOSIS — C8330 Diffuse large B-cell lymphoma, unspecified site: Secondary | ICD-10-CM | POA: Insufficient documentation

## 2020-10-11 DIAGNOSIS — Z5112 Encounter for antineoplastic immunotherapy: Secondary | ICD-10-CM | POA: Insufficient documentation

## 2020-10-11 DIAGNOSIS — Z7189 Other specified counseling: Secondary | ICD-10-CM

## 2020-10-11 LAB — CBC WITH DIFFERENTIAL (CANCER CENTER ONLY)
Abs Immature Granulocytes: 0.01 10*3/uL (ref 0.00–0.07)
Basophils Absolute: 0 10*3/uL (ref 0.0–0.1)
Basophils Relative: 0 %
Eosinophils Absolute: 0.1 10*3/uL (ref 0.0–0.5)
Eosinophils Relative: 2 %
HCT: 38.2 % — ABNORMAL LOW (ref 39.0–52.0)
Hemoglobin: 12.9 g/dL — ABNORMAL LOW (ref 13.0–17.0)
Immature Granulocytes: 0 %
Lymphocytes Relative: 30 %
Lymphs Abs: 1.7 10*3/uL (ref 0.7–4.0)
MCH: 30.7 pg (ref 26.0–34.0)
MCHC: 33.8 g/dL (ref 30.0–36.0)
MCV: 91 fL (ref 80.0–100.0)
Monocytes Absolute: 0.7 10*3/uL (ref 0.1–1.0)
Monocytes Relative: 12 %
Neutro Abs: 3.2 10*3/uL (ref 1.7–7.7)
Neutrophils Relative %: 56 %
Platelet Count: 135 10*3/uL — ABNORMAL LOW (ref 150–400)
RBC: 4.2 MIL/uL — ABNORMAL LOW (ref 4.22–5.81)
RDW: 12.8 % (ref 11.5–15.5)
WBC Count: 5.7 10*3/uL (ref 4.0–10.5)
nRBC: 0 % (ref 0.0–0.2)

## 2020-10-11 LAB — CMP (CANCER CENTER ONLY)
ALT: 12 U/L (ref 0–44)
AST: 17 U/L (ref 15–41)
Albumin: 3.9 g/dL (ref 3.5–5.0)
Alkaline Phosphatase: 69 U/L (ref 38–126)
Anion gap: 9 (ref 5–15)
BUN: 13 mg/dL (ref 8–23)
CO2: 30 mmol/L (ref 22–32)
Calcium: 9.2 mg/dL (ref 8.9–10.3)
Chloride: 104 mmol/L (ref 98–111)
Creatinine: 0.91 mg/dL (ref 0.61–1.24)
GFR, Estimated: >60 mL/min (ref >60)
Glucose, Bld: 88 mg/dL (ref 70–99)
Potassium: 4 mmol/L (ref 3.5–5.1)
Sodium: 143 mmol/L (ref 135–145)
Total Bilirubin: 0.4 mg/dL (ref 0.3–1.2)
Total Protein: 7.2 g/dL (ref 6.5–8.1)

## 2020-10-13 ENCOUNTER — Telehealth: Payer: Self-pay | Admitting: Hematology

## 2020-10-13 NOTE — Telephone Encounter (Signed)
Scheduled follow-up appointment per 7/18 los. Patient is aware. 

## 2020-10-16 ENCOUNTER — Other Ambulatory Visit: Payer: Self-pay | Admitting: Hematology

## 2020-10-17 ENCOUNTER — Encounter: Payer: Self-pay | Admitting: Hematology

## 2020-10-18 ENCOUNTER — Ambulatory Visit: Payer: Medicare Other

## 2020-10-18 ENCOUNTER — Inpatient Hospital Stay: Payer: Medicare Other

## 2020-10-18 ENCOUNTER — Other Ambulatory Visit: Payer: Self-pay

## 2020-10-18 ENCOUNTER — Other Ambulatory Visit: Payer: Self-pay | Admitting: Hematology

## 2020-10-18 ENCOUNTER — Inpatient Hospital Stay: Payer: Medicare Other | Admitting: Hematology

## 2020-10-18 ENCOUNTER — Encounter: Payer: Self-pay | Admitting: Hematology

## 2020-10-18 VITALS — BP 117/84 | HR 55 | Temp 98.2°F | Ht 69.0 in | Wt 159.8 lb

## 2020-10-18 DIAGNOSIS — Z5112 Encounter for antineoplastic immunotherapy: Secondary | ICD-10-CM | POA: Diagnosis not present

## 2020-10-18 DIAGNOSIS — C8330 Diffuse large B-cell lymphoma, unspecified site: Secondary | ICD-10-CM | POA: Diagnosis not present

## 2020-10-18 DIAGNOSIS — C8318 Mantle cell lymphoma, lymph nodes of multiple sites: Secondary | ICD-10-CM

## 2020-10-18 DIAGNOSIS — Z79899 Other long term (current) drug therapy: Secondary | ICD-10-CM | POA: Diagnosis not present

## 2020-10-18 DIAGNOSIS — C8331 Diffuse large B-cell lymphoma, lymph nodes of head, face, and neck: Secondary | ICD-10-CM

## 2020-10-18 LAB — CBC WITH DIFFERENTIAL/PLATELET
Abs Immature Granulocytes: 0.01 10*3/uL (ref 0.00–0.07)
Basophils Absolute: 0 10*3/uL (ref 0.0–0.1)
Basophils Relative: 0 %
Eosinophils Absolute: 0.1 10*3/uL (ref 0.0–0.5)
Eosinophils Relative: 1 %
HCT: 38.6 % — ABNORMAL LOW (ref 39.0–52.0)
Hemoglobin: 13.2 g/dL (ref 13.0–17.0)
Immature Granulocytes: 0 %
Lymphocytes Relative: 25 %
Lymphs Abs: 1.5 10*3/uL (ref 0.7–4.0)
MCH: 30.5 pg (ref 26.0–34.0)
MCHC: 34.2 g/dL (ref 30.0–36.0)
MCV: 89.1 fL (ref 80.0–100.0)
Monocytes Absolute: 0.6 10*3/uL (ref 0.1–1.0)
Monocytes Relative: 9 %
Neutro Abs: 3.8 10*3/uL (ref 1.7–7.7)
Neutrophils Relative %: 65 %
Platelets: 142 10*3/uL — ABNORMAL LOW (ref 150–400)
RBC: 4.33 MIL/uL (ref 4.22–5.81)
RDW: 12.4 % (ref 11.5–15.5)
WBC: 6 10*3/uL (ref 4.0–10.5)
nRBC: 0 % (ref 0.0–0.2)

## 2020-10-18 LAB — CMP (CANCER CENTER ONLY)
ALT: 12 U/L (ref 0–44)
AST: 19 U/L (ref 15–41)
Albumin: 3.9 g/dL (ref 3.5–5.0)
Alkaline Phosphatase: 72 U/L (ref 38–126)
Anion gap: 11 (ref 5–15)
BUN: 12 mg/dL (ref 8–23)
CO2: 26 mmol/L (ref 22–32)
Calcium: 9.4 mg/dL (ref 8.9–10.3)
Chloride: 104 mmol/L (ref 98–111)
Creatinine: 0.91 mg/dL (ref 0.61–1.24)
GFR, Estimated: >60 mL/min (ref >60)
Glucose, Bld: 142 mg/dL — ABNORMAL HIGH (ref 70–99)
Potassium: 3.6 mmol/L (ref 3.5–5.1)
Sodium: 141 mmol/L (ref 135–145)
Total Bilirubin: 0.6 mg/dL (ref 0.3–1.2)
Total Protein: 7 g/dL (ref 6.5–8.1)

## 2020-10-18 LAB — LACTATE DEHYDROGENASE: LDH: 151 U/L (ref 98–192)

## 2020-10-18 MED ORDER — FAMOTIDINE 20 MG IN NS 100 ML IVPB
20.0000 mg | Freq: Once | INTRAVENOUS | Status: AC
  Administered 2020-10-18: 20 mg via INTRAVENOUS

## 2020-10-18 MED ORDER — METHYLPREDNISOLONE SODIUM SUCC 125 MG IJ SOLR
INTRAMUSCULAR | Status: AC
  Filled 2020-10-18: qty 2

## 2020-10-18 MED ORDER — ACETAMINOPHEN 325 MG PO TABS
ORAL_TABLET | ORAL | Status: AC
  Filled 2020-10-18: qty 2

## 2020-10-18 MED ORDER — HEPARIN SOD (PORK) LOCK FLUSH 100 UNIT/ML IV SOLN
500.0000 [IU] | Freq: Once | INTRAVENOUS | Status: AC | PRN
  Administered 2020-10-18: 500 [IU]
  Filled 2020-10-18: qty 5

## 2020-10-18 MED ORDER — METHYLPREDNISOLONE SODIUM SUCC 125 MG IJ SOLR
80.0000 mg | Freq: Once | INTRAMUSCULAR | Status: AC
  Administered 2020-10-18: 80 mg via INTRAVENOUS

## 2020-10-18 MED ORDER — DIPHENHYDRAMINE HCL 25 MG PO CAPS
50.0000 mg | ORAL_CAPSULE | Freq: Once | ORAL | Status: AC
  Administered 2020-10-18: 50 mg via ORAL

## 2020-10-18 MED ORDER — FAMOTIDINE 20 MG IN NS 100 ML IVPB
INTRAVENOUS | Status: AC
  Filled 2020-10-18: qty 100

## 2020-10-18 MED ORDER — SODIUM CHLORIDE 0.9 % IV SOLN
Freq: Once | INTRAVENOUS | Status: AC
  Filled 2020-10-18: qty 250

## 2020-10-18 MED ORDER — DIPHENHYDRAMINE HCL 25 MG PO CAPS
ORAL_CAPSULE | ORAL | Status: AC
  Filled 2020-10-18: qty 2

## 2020-10-18 MED ORDER — SODIUM CHLORIDE 0.9% FLUSH
10.0000 mL | INTRAVENOUS | Status: DC | PRN
  Administered 2020-10-18: 10 mL
  Filled 2020-10-18: qty 10

## 2020-10-18 MED ORDER — ACETAMINOPHEN 325 MG PO TABS
650.0000 mg | ORAL_TABLET | Freq: Once | ORAL | Status: AC
  Administered 2020-10-18: 650 mg via ORAL

## 2020-10-18 MED ORDER — SODIUM CHLORIDE 0.9 % IV SOLN
375.0000 mg/m2 | Freq: Once | INTRAVENOUS | Status: AC
  Administered 2020-10-18: 700 mg via INTRAVENOUS
  Filled 2020-10-18: qty 50

## 2020-10-18 NOTE — Patient Instructions (Signed)
[facility]  Discharge Instructions: Thank you for choosing [facility] to provide your oncology and hematology care.   If you have a lab appointment with the Cancer Center, please go directly to the Cancer Center and check in at the registration area.   Wear comfortable clothing and clothing appropriate for easy access to any Portacath or PICC line.   We strive to give you quality time with your provider. You may need to reschedule your appointment if you arrive late (15 or more minutes).  Arriving late affects you and other patients whose appointments are after yours.  Also, if you miss three or more appointments without notifying the office, you may be dismissed from the clinic at the provider's discretion.      For prescription refill requests, have your pharmacy contact our office and allow 72 hours for refills to be completed.    Today you received the following chemotherapy and/or immunotherapy agents Rituxan      To help prevent nausea and vomiting after your treatment, we encourage you to take your nausea medication as directed.  BELOW ARE SYMPTOMS THAT SHOULD BE REPORTED IMMEDIATELY: *FEVER GREATER THAN 100.4 F (38 C) OR HIGHER *CHILLS OR SWEATING *NAUSEA AND VOMITING THAT IS NOT CONTROLLED WITH YOUR NAUSEA MEDICATION *UNUSUAL SHORTNESS OF BREATH *UNUSUAL BRUISING OR BLEEDING *URINARY PROBLEMS (pain or burning when urinating, or frequent urination) *BOWEL PROBLEMS (unusual diarrhea, constipation, pain near the anus) TENDERNESS IN MOUTH AND THROAT WITH OR WITHOUT PRESENCE OF ULCERS (sore throat, sores in mouth, or a toothache) UNUSUAL RASH, SWELLING OR PAIN  UNUSUAL VAGINAL DISCHARGE OR ITCHING   Items with * indicate a potential emergency and should be followed up as soon as possible or go to the Emergency Department if any problems should occur.  Please show the CHEMOTHERAPY ALERT CARD or IMMUNOTHERAPY ALERT CARD at check-in to the  Emergency Department and triage nurse.  Should you have questions after your visit or need to cancel or reschedule your appointment, please contact [facility]  Dept: [phone]  and follow the prompts.  Office hours are 8:00 a.m. to 4:30 p.m. Monday - Friday. Please note that voicemails left after 4:00 p.m. may not be returned until the following business day.  We are closed weekends and major holidays. You have access to a nurse at all times for urgent questions. Please call the main number to the clinic Dept: [phone] and follow the prompts.   For any non-urgent questions, you may also contact your provider using MyChart. We now offer e-Visits for anyone 18 and older to request care online for non-urgent symptoms. For details visit [email or web address].   Also download the MyChart app! Go to the app store, search "MyChart", open the app, select [facility], and log in with your MyChart username and password.  Due to Covid, a mask is required upon entering the hospital/clinic. If you do not have a mask, one will be given to you upon arrival. For doctor visits, patients may have 1 support person aged 18 or older with them. For treatment visits, patients cannot have anyone with them due to current Covid guidelines and our immunocompromised population.   

## 2020-10-20 ENCOUNTER — Telehealth: Payer: Self-pay | Admitting: Hematology

## 2020-10-20 NOTE — Telephone Encounter (Signed)
Scheduled appt per 7/26 sch msg. Pt aware.

## 2020-10-21 ENCOUNTER — Other Ambulatory Visit: Payer: Self-pay | Admitting: Family Medicine

## 2020-10-21 DIAGNOSIS — I10 Essential (primary) hypertension: Secondary | ICD-10-CM

## 2020-10-26 ENCOUNTER — Other Ambulatory Visit: Payer: Self-pay

## 2020-10-26 ENCOUNTER — Ambulatory Visit (HOSPITAL_COMMUNITY)
Admission: RE | Admit: 2020-10-26 | Discharge: 2020-10-26 | Disposition: A | Discharge status: Home / Self Care | Payer: Medicare Other | Source: Ambulatory Visit | Attending: Hematology | Admitting: Hematology

## 2020-10-26 DIAGNOSIS — K769 Liver disease, unspecified: Secondary | ICD-10-CM | POA: Diagnosis not present

## 2020-10-26 DIAGNOSIS — I700 Atherosclerosis of aorta: Secondary | ICD-10-CM | POA: Diagnosis not present

## 2020-10-26 DIAGNOSIS — Z8572 Personal history of non-Hodgkin lymphomas: Secondary | ICD-10-CM | POA: Diagnosis not present

## 2020-10-26 DIAGNOSIS — K8020 Calculus of gallbladder without cholecystitis without obstruction: Secondary | ICD-10-CM | POA: Diagnosis not present

## 2020-10-26 DIAGNOSIS — K828 Other specified diseases of gallbladder: Secondary | ICD-10-CM | POA: Diagnosis not present

## 2020-10-26 MED ORDER — GADOBUTROL 1 MMOL/ML IV SOLN
7.0000 mL | Freq: Once | INTRAVENOUS | Status: AC | PRN
  Administered 2020-10-26: 7 mL via INTRAVENOUS

## 2020-10-26 NOTE — Progress Notes (Signed)
HEMATOLOGY/ONCOLOGY CLINIC NOTE  Date of Service:   Patient Care Team: Denita Lung, MD as PCP - General (Family Medicine)  CHIEF COMPLAINTS/PURPOSE OF CONSULTATION:  History of diffuse Large B-Cell Lymphoma Follow-up for continued management of mantle cell lymphoma  HISTORY OF PRESENTING ILLNESS:   Mario Garcia is a wonderful 87 y.o. male who has been referred to [place] by Dr. Jill Alexanders for evaluation and management of Diffuse Large B-Cell Lymphoma. The pt reports that he is doing well overall.   The pt reports that he feels that he has been a little more tired recently. He first noticed some "nodules" on his neck appear "4-6 weeks ago." He notes that these nodules haven't been painful. He appears to have presented to care with his PCP on 05/28/18, Dr. Redmond School. He notes that he has noticed that he has been sweating more in the last year. He denies noticing any other new lumps or bumps. He denies any fevers, chills, drenching night sweats, or unexpected weight loss. He notes that he has had some changes in swallowing over the last 6 months, which he characterizes as "getting strangled on his saliva every now and then." He denies abdominal pains, acid reflux, changes in bowel habits, leg swelling, skin rashes. He endorses a deep pain "every once in a while," in his left groin. He denies headaches. He endorses glaucoma and a history of cataracts. He sees Dr. Hetty Blend in ophthalmology. He denies any other concern in the last 6 months.  The pt notes that he lives independently with his wife and still is able to do all he wants to do. He walks on trails with his wife and notes that he can generally walk as far as he likes to.  The pt notes that he had prostate cancer in 2008 or 2009, s/p prostatectomy. He did not require RT nor systemic treatments. He denies lung, heart or kidney problems.  Of note prior to the patient's visit today, pt has had a CT Neck completed on 06/05/18 with  results revealing Pathologic RIGHT-sided level II, III, IV, and V lymphadenopathy, most consistent with metastatic carcinoma. An obvious primary source is not identified. Tissue sampling is warranted.  Most recent lab results (07/08/18) of CBC and CMP is as follows: all values are WNL except for PLT at 129k.  On review of systems, pt reports slightly more tired, neck nodules, some changes in swallowing, staying active, and denies fevers, chills, drenching night sweats, unexpected weight loss, abdominal pains, changes in bowel habits, skin rashes, leg swelling, CP, SOB, difficulty breathing, headaches, other lumps or bumps, skin lesions, mouth sores, pain along the spine, back pain, leg swelling, and any other symptoms.   On PMHx the pt reports localized Prostate cancer in 2008 s/p prostatectomy.  On Social Hx the pt reports that he quit smoking over 50 years ago. He notes that he consumes about 1 glass of wine every day, without concerns for excessive drinking. 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 He denies concern for chemical or radiation exposure. On Family Hx the pt reports wife with Stage IV Non Hodgkin's Lymphoma, paternal grandmother with leukemia in her 10s, father with unspecified abdominal cancer.   INTERVAL HISTORY:  I connected with Mario Garcia  on 10/27/2020 by telephone and verified that I am speaking with the correct person using two identifiers.   I discussed the limitations of evaluation and management by telemedicine. The patient expressed  understanding and agreed to proceed.   Other persons participating in the visit and their role in the encounter:                                                         - Reinaldo Raddle, Medical Scribe     Patient's location: Home Provider's location: [place] [facility] [employer] returns today for management and evaluation of his mantle cell lymphoma.  The patient's last visit with [place] was on 10/11/2020.  The pt reports that he is doing well overall.and has no acute new symptoms. We are joined today by his wife.  The pt was called to discuss his MRI abd results. Of note since the patient's last visit, pt has had MR Liver ([id number]) on 10/26/2020, which revealed "1. As noted on prior examination, there is marked, focal wall thickening at the gallbladder fundus, measuring approximately 1.7 cm with associated heterogeneous contrast enhancement. This finding is concerning for gallbladder malignancy given increased over time and previously associated FDG avidity. 2. No definite liver lesion to correspond to suspected FDG avidity within the anterior right lobe of the liver adjacent to the gallbladder fundus. Upon review of prior PET-CT examination, this appears to have actually reflected FDG avidity of the gallbladder fundus misregistered to the liver due to breath motion artifact. 3. Cholelithiasis.  No biliary ductal dilatation. 4. Prominent retroperitoneal lymph nodes, not significantly changed compared to prior and in keeping with known lymphoma."  Lab results 10/18/2020 of CBC w/diff and CMP is as follows: all values are WNL except for HCT of 38.6, Plt fo 142K, Glucose of 142. 10/18/2020 LDH of 151.  On review of systems, pt reports no abdominal pain no fevers/chills/night sweats, nausea or vomiting.   MEDICAL HISTORY:  Past Medical History:  Diagnosis Date   Arthritis    Cancer ([place])    PROSTATE   History of colonic polyps    History of kidney stones    Hypertension    Inguinal hernia 03/2002   Pneumonia    "years ago"    SURGICAL HISTORY: Past Surgical History:  Procedure Laterality Date   COLONOSCOPY     DIRECT LARYNGOSCOPY N/A 07/08/2018   Procedure: DIRECT LARYNGOSCOPY;  Surgeon: Leta Baptist, MD;  Location: [facility];  Service: ENT;  Laterality: N/A;   ESOPHAGOSCOPY N/A 07/08/2018   Procedure: ESOPHAGOSCOPY;  Surgeon: Leta Baptist, MD;  Location: [facility];  Service: ENT;  Laterality:  N/A;   FLEXIBLE BRONCHOSCOPY N/A 07/08/2018   Procedure: FLEXIBLE BRONCHOSCOPY;  Surgeon: Leta Baptist, MD;  Location: [facility];  Service: ENT;  Laterality: N/A;   HERNIA REPAIR Left    inguinal hernia   INGUINAL HERNIA REPAIR Right 09/03/2020   Procedure: OPEN RIGHT INGUINAL HERNIA REPAIR;  Surgeon: Clovis Riley, MD;  Location: [facility];  Service: General;  Laterality: Right;   IR IMAGING GUIDED PORT INSERTION  07/30/2018   MASS BIOPSY Right 07/08/2018   Procedure: RIGHT NECK MASS EXCISIONAL BIOPSY;  Surgeon: Leta Baptist, MD;  Location: [facility];  Service: ENT;  Laterality: Right;   PATELLA FRACTURE SURGERY Left    PROSTATE SURGERY  10/2005   RADIAL PROSTATECTOMY   ROTATOR CUFF REPAIR Bilateral     SOCIAL HISTORY: Social History   Socioeconomic History   Marital status: Married  Spouse name: Not on file   Number of children: Not on file   Years of education: Not on file   Highest education level: Not on file  Occupational History   Not on file  Tobacco Use   Smoking status: Former   Smokeless tobacco: Never   Tobacco comments:    stopped at the age of  89  Vaping Use   Vaping Use: Never used  Substance and Sexual Activity   Alcohol use: Not Currently    Alcohol/week: 6.0 standard drinks    Types: 6 Standard drinks or equivalent per week   Drug use: No   Sexual activity: Not Currently  Other Topics Concern   Not on file  Social History Narrative   Not on file   Social Determinants of Health   Financial Resource Strain: Not on file  Food Insecurity: Not on file  Transportation Needs: Not on file  Physical Activity: Not on file  Stress: Not on file  Social Connections: Not on file  Intimate Partner Violence: Not on file    FAMILY HISTORY: Family History  Problem Relation Age of Onset   Cancer Father    Cancer Brother     ALLERGIES:  is allergic to lisinopril and penicillins.  MEDICATIONS:  Current Outpatient Medications  Medication Sig Dispense Refill   acyclovir  (ZOVIRAX) 400 MG tablet TAKE 1 TABLET BY MOUTH TWICE A DAY (Patient taking differently: Take 400 mg by mouth 2 (two) times daily.) 180 tablet 1   latanoprost (XALATAN) 0.005 % ophthalmic solution Place 1 drop into both eyes at bedtime.     losartan-hydrochlorothiazide (HYZAAR) 50-12.5 MG tablet TAKE 1 TABLET BY MOUTH EVERY DAY 90 tablet 1   Multiple Vitamins-Minerals (ICAPS AREDS 2 PO) Take 1 tablet by mouth daily.     pravastatin (PRAVACHOL) 40 MG tablet Take 1 tablet by mouth once daily 90 tablet 0   traMADol (ULTRAM) 50 MG tablet Take 1 tablet (50 mg total) by mouth every 6 (six) hours as needed. 15 tablet 0   No current facility-administered medications for this visit.    REVIEW OF SYSTEMS:   10 Point review of Systems was done is negative except as noted above.  PHYSICAL EXAMINATION: ECOG FS:1 - Symptomatic but completely ambulatory  There were no vitals filed for this visit.   Wt Readings from Last 3 Encounters:  10/18/20 159 lb 12 oz (72.5 kg)  10/11/20 160 lb 11.2 oz (72.9 kg)  09/03/20 158 lb 15.2 oz (72.1 kg)   There is no height or weight on file to calculate BMI.    Telehealth Visit.   LABORATORY DATA:  I have reviewed the data as listed  . CBC Latest Ref Rng & Units 10/18/2020 10/11/2020 08/30/2020  WBC 4.0 - 10.5 K/uL 6.0 5.7 5.0  Hemoglobin 13.0 - 17.0 g/dL 13.2 12.9(L) 13.1  Hematocrit 39.0 - 52.0 % 38.6(L) 38.2(L) 38.5(L)  Platelets 150 - 400 K/uL 142(L) 135(L) 128(L)    . CMP Latest Ref Rng & Units 10/18/2020 10/11/2020 08/30/2020  Glucose 70 - 99 mg/dL 142(H) 88 105(H)  BUN 8 - 23 mg/dL _0 Creatinine 0.61 - 1.24 mg/dL 0.91 0.91 0.96  Sodium 135 - 145 mmol/L 141 143 138  Potassium 3.5 - 5.1 mmol/L 3.6 4.0 3.9  Chloride 98 - 111 mmol/L 104 104 103  CO2 22 - 32 mmol/L _1 Calcium 8.9 - 10.3 mg/dL 9.4 9.2 9.2  Total Protein 6.5 - 8.1 g/dL 7.0  7.2 -  Total Bilirubin 0.3 - 1.2 mg/dL 0.6 0.4 -  Alkaline Phos 38 - 126 U/L 72 69 -  AST 15 - 41 U/L  19 17 -  ALT 0 - 44 U/L 12 12 -   . Lab Results  Component Value Date   LDH 151 10/18/2020   02/23/2020 Right Cervical Lymph Node Surgical Pathology [phone]):    02/23/2020 FISH Analysis:   02/23/2020 FISH Mutation Analysis:    03/03/2019 NM PET Image Restag (PS) Skull Base To Thigh (Accession [id number]):   07/08/18 Right Cervical Tissue Biopsy:    RADIOGRAPHIC STUDIES: I have personally reviewed the radiological images as listed and agreed with the findings in the report. MR LIVER W WO CONTRAST  Result Date: 10/26/2020 CLINICAL DATA:  Evaluate liver lesion, evaluate gallbladder wall thickening, history of mantle cell lymphoma EXAM: MRI ABDOMEN WITHOUT AND WITH CONTRAST TECHNIQUE: Multiplanar multisequence MR imaging of the abdomen was performed both before and after the administration of intravenous contrast. CONTRAST:  36m GADAVIST GADOBUTROL 1 MMOL/ML IV SOLN COMPARISON:  PET-CT, 10/01/2020 FINDINGS: Lower chest: No acute findings. Hepatobiliary: No definite liver lesion to correspond to suspected FDG avidity within the anterior right lobe of the liver adjacent to the gallbladder fundus. Numerous gallstones in the gallbladder. As noted on prior examination, there is marked, focal wall thickening at the gallbladder fundus, measuring approximately 1.7 cm with associated heterogeneous contrast enhancement (series 5, image 20). No biliary ductal dilatation. Pancreas: No mass, inflammatory changes, or other parenchymal abnormality identified. Spleen:  Within normal limits in size and appearance. Adrenals/Urinary Tract: No masses identified. No evidence of hydronephrosis. Stomach/Bowel: Visualized portions within the abdomen are unremarkable. Vascular/Lymphatic: Prominent retroperitoneal lymph nodes, not significantly changed compared to prior. Aortic atherosclerosis. No abdominal aortic aneurysm demonstrated. Other:  None. Musculoskeletal: No suspicious bone lesions identified.  IMPRESSION: 1. As noted on prior examination, there is marked, focal wall thickening at the gallbladder fundus, measuring approximately 1.7 cm with associated heterogeneous contrast enhancement. This finding is concerning for gallbladder malignancy given increased over time and previously associated FDG avidity. 2. No definite liver lesion to correspond to suspected FDG avidity within the anterior right lobe of the liver adjacent to the gallbladder fundus. Upon review of prior PET-CT examination, this appears to have actually reflected FDG avidity of the gallbladder fundus misregistered to the liver due to breath motion artifact. 3. Cholelithiasis.  No biliary ductal dilatation. 4. Prominent retroperitoneal lymph nodes, not significantly changed compared to prior and in keeping with known lymphoma. Aortic Atherosclerosis (ICD10-I70.0). Electronically Signed   By: AEddie CandleM.D.   On: 10/26/2020 15:27   NM PET Image Restag (PS) Skull Base To Thigh  Result Date: 10/01/2020 CLINICAL DATA:  Subsequent treatment strategy for mantle cell lymphoma in an 87year old male. EXAM: NUCLEAR MEDICINE PET SKULL BASE TO THIGH TECHNIQUE: 7.77 mCi F-18 FDG was injected intravenously. Full-ring PET imaging was performed from the skull base to thigh after the radiotracer. CT data was obtained and used for attenuation correction and anatomic localization. Fasting blood glucose: 95 mg/dl COMPARISON:  Multiple prior studies most recent of June 16, 2020. FINDINGS: Mediastinal blood pool activity: SUV max 2.49 Liver activity: SUV max 3.1 NECK: Increase in bilateral tonsillar activity may be physiologic but is seen in the context of worsening of nodal disease elsewhere. LEFT lingual tonsil with maximum SUV of 7.0. Scattered lymph nodes throughout the neck, for example a RIGHT level II lymph node with a maximum SUV of 3.4 previously with  a maximum SUV of 3.0 (image 32/4) 6 mm. This is the dominant node in the neck and is little changed  compared to the prior study. At the thoracic inlet however there is increased metabolic activity within an index node at the LEFT thoracic inlet (image 45/4) this lymph node measures 4 mm and is unchanged with respect to size but shows a maximum SUV of 3.7 previously 2.6. Similar SUV in the RIGHT thoracic inlet lymph node on image 45 of series 4 which is of similar size not previously shown to be hypermetabolic. Incidental CT findings: none CHEST: Increasing metabolism in LEFT axillary lymph nodes (image 50/4) maximum SUV of 7.3, previously 3.6 of a LEFT axillary lymph node measuring approximately 6 mm short axis, similar size compared to the previous study. Similar metabolism in a lymph node just lateral to this which is stable in terms of radiographic, CT appearance measuring a cm short axis but showed no increased metabolism on the most recent comparison RIGHT axillary lymph node (image 56/4) seen with fatty hilum measuring 1 cm short axis and stable in terms of CT appearance but with a maximum SUV of 5.0 Similar increase in metabolic activity of a small RIGHT axillary lymph node on image 56 of series 4 that measures approximately 6 mm with a maximum SUV of 5.8 previously shown to have metabolic activity less than mediastinal blood pool. Generalized mild increased metabolic activity within mediastinal lymph nodes most pronounced in subcarinal lymph nodes on image 72 of series 4 maximum SUV 4.8 previously at or less than mediastinal blood pool but without change in terms of size compared to the previous exam at 0.6 cm short axis. Incidental CT findings: LEFT-sided Port-A-Cath in situ terminates at the caval to atrial junction. Aortic atherosclerosis as before. No aneurysmal dilation. Normal caliber of the central pulmonary vessels. Mild cardiac enlargement with similar appearance to the prior study. Basilar atelectasis. Small nodules in the RIGHT chest unchanged as described above. No effusion or consolidation in  the chest. Patulous esophagus. ABDOMEN/PELVIS: Increased metabolic activity about periportal lymph nodes maximum SUV on image 110 of series 4 of 5.9 likely localizing to a periportal lymph node on image 115 of the CT data set measuring approximately 0.6 cm short axis. RIGHT common iliac lymph node measuring 8 mm with a maximum SUV of 6.6 previously less activity than blood pool. Generalized increase in activity of lymph nodes in the pelvis. Index node in the LEFT hemipelvis as an example measures 8 mm as compared to 7 mm (image 176/4) maximum SUV of 11 as compared to 4 on the prior study. Similar appearance of RIGHT pelvic lymph nodes. Retroperitoneal lymph nodes also with increased metabolic activity since the prior study following other lymph nodes described above. Gallbladder wall thickening measuring 2.2 x 1.4 cm previously 0.7 cm greatest thickness. Adjacent to this in the medial segment of the LEFT hepatic lobe, hepatic subsegment IV is a 1.2 cm lesion with a maximum SUV of 7.7 (image 115/4) Incidental CT findings: Smooth liver contour. No pericholecystic stranding. Pancreas without sign of acute process. Normal size spleen. Adrenal glands are normal. No hydronephrosis. Collapsed urinary bladder. No perinephric stranding beyond what was present previously. No nephrolithiasis. No acute gastrointestinal process. Interval herniorrhaphy and return of small-bowel loops that were present in a RIGHT inguinal hernia into the abdomen. Appendix is normal. Aortic atherosclerosis. No aneurysmal dilation. Smooth contour of the IVC. Adenopathy and hypermetabolic lymph nodes as discussed above. Post prostatectomy. Signs of RIGHT inguinal herniorrhaphy.  Mild stranding about the site of hernia repair. No fluid collection. SKELETON: No focal hypermetabolic activity to suggest skeletal metastasis. Incidental CT findings: Spinal degenerative changes. IMPRESSION: Overall assessment as Deauville criteria 5 with generalized increase  in metabolic activity associated with lymph nodes which show little change in size in the neck, chest, abdomen and pelvis. Lesion at the site of metabolic activity in the liver with increasing metabolic activity likely related to hepatic involvement. Marked increase in thickening of the gallbladder fundus adjacent to the above finding. This is not clearly hypermetabolic accounting for some respiratory variation but is suspicious given enlargement. Consider MRI with without contrast or contrasted CT for more complete assessment of the above findings. No signs of increased metabolism in the spleen. Post RIGHT inguinal herniorrhaphy. Aortic atherosclerosis. Coronary artery calcification. Aortic Atherosclerosis (ICD10-I70.0). Electronically Signed   By: Zetta Bills M.D.   On: 10/01/2020 17:40    ASSESSMENT & PLAN:   87 y.o. male with  1. H/o Diffuse Large B-Cell Lymphoma, Stage IV- now in remission Presenting without constitutional symptoms. Palpable right cervical and supraclavicular lymphadenopathy.  07/08/18 Right cervical soft tissue biopsy revealed Diffuse Large B-Cell Lymphoma, germinal center type  06/05/18 CT Neck revealed Pathologic RIGHT-sided level II, III, IV, and V lymphadenopathy, most consistent with metastatic carcinoma. An obvious primary source is not identified. Tissue sampling is warranted. 07/16/18 Hep B and Hep C negative 07/17/18 ECHO revealed LV EF of 60-65% 07/22/18 PET/CT revealed "Hypermetabolic adenopathy especially concentrated in the right neck but also in the left neck, chest, abdomen, and pelvis. The adenopathy is primarily Deauville 5 although some few scattered smaller lesions are Deauville 4. 2. Diffuse abnormal splenic activity, Deauville 5, without overt splenomegaly. 3. There is also hypermetabolic Deauville 5 activity in the mildly thickened distal appendix, and raising suspicion for involvement of the appendix. 4. Other imaging findings of potential clinical  significance: Aortic Atherosclerosis. Coronary atherosclerosis." 10/03/2018 PET scan revealed "Interval response to therapy with stable to decreased size of lymph nodes on CT and generalized decrease in hypermetabolism of the abnormal nodes. Hypermetabolism in the lymph nodes today is compatible with a combination of Deauville 3 and Deauville 4 disease. Stable tiny bilateral pulmonary nodules. Increase radiotracer accumulation in the marrow space on today's study, presumably representing marrow stimulatory effects of therapy."  2. Currently with new Non-Hodgkin's B-cell Lymphoma - consistent with Mantle Cell 02/23/2020 Right Cervical Lymph Node Surgical Pathology [phone]) which revealed "Non-Hodgkin B-cell lymphoma. Ki-67 generally shows low expression (<5-15%)." 02/23/2020 FISH Mutation Analysis which revealed "t(11;14): DETECTED".  3. Newly noted gall bladder lesion concern for gallbladder cancer PLAN -previous lab workup 7/15 with normal LDH levels -Discussed pt MRI liver shows marked, focal wall thickening at the gallbladder fundus, measuring approximately 1.7 cm with associated heterogeneous contrast enhancement. This finding is concerning for gallbladder malignancy given increased over time and previously associated FDG avidity. Previous FDG avidity in liver on pet/ct not visible as liver lesion on MRI liver. -referred to [place] surgery for mx of possible gall bladder cancer. -continue Rituxan  -will hold off on addiing Ibrutinib for mantle cell lymphoma at this tme-pending possible cholecystectomy. -Recommended pt check his baseline BP first thing in the morning prior to any movement or coffee. If low, pt will need to contact PCP for medication adjustment.   FOLLOW UP: Referral to Dr. Barry Dienes for possible gallbladder cancer- urgent -Follow-up as scheduled for next cycle of Rituxan-MD visit in September. -Patient will likely need an earlier clinic visit with me  based on  surgical opinion and if he has a cholecystectomy.     The total time spent in the appointment was 30 minutes and more than 50% was on counseling and direct patient cares.  All of the patient's questions were answered with apparent satisfaction. The patient knows to call the clinic with any problems, questions or concerns.   Sullivan Lone MD [place] AAHIVMS [facility] Hematology/Oncology Physician [facility]  (Office):       [phone] (Work cell):  [phone] (Fax):           [phone]   I, Reinaldo Raddle, am acting as scribe for Dr. Sullivan Lone, MD  .I have reviewed the above documentation for accuracy and completeness, and I agree with the above. Brunetta Genera MD

## 2020-10-27 ENCOUNTER — Inpatient Hospital Stay: Payer: Medicare Other | Attending: Hematology | Admitting: Hematology

## 2020-10-27 DIAGNOSIS — C8310 Mantle cell lymphoma, unspecified site: Secondary | ICD-10-CM | POA: Insufficient documentation

## 2020-10-27 DIAGNOSIS — C23 Malignant neoplasm of gallbladder: Secondary | ICD-10-CM | POA: Diagnosis not present

## 2020-10-27 DIAGNOSIS — C8318 Mantle cell lymphoma, lymph nodes of multiple sites: Secondary | ICD-10-CM | POA: Diagnosis not present

## 2020-11-04 ENCOUNTER — Encounter: Payer: Self-pay | Admitting: Hematology

## 2020-11-08 ENCOUNTER — Other Ambulatory Visit: Payer: Self-pay | Admitting: General Surgery

## 2020-11-08 DIAGNOSIS — I700 Atherosclerosis of aorta: Secondary | ICD-10-CM | POA: Diagnosis not present

## 2020-11-08 DIAGNOSIS — K828 Other specified diseases of gallbladder: Secondary | ICD-10-CM | POA: Diagnosis not present

## 2020-11-09 ENCOUNTER — Encounter: Payer: Self-pay | Admitting: Hematology

## 2020-12-01 NOTE — Pre-Procedure Instructions (Signed)
Surgical Instructions   Your procedure is scheduled on Tuesday, September 20th. Report to [facility] Main Entrance "A" at 07:00 A.M., then check in with the Admitting office. Call this number if you have problems the morning of surgery: [phone]   If you have any questions prior to your surgery date call [phone]: Open Monday-Friday 8am-4pm   Remember: Do not eat after midnight the night before your surgery  You may drink clear liquids until 06:00 AM the morning of your surgery.   Clear liquids allowed are: Water, Non-Citrus Juices (without pulp), Carbonated Beverages, Clear Tea, Black Coffee ONLY (NO MILK, CREAM OR POWDERED CREAMER of any kind), and Gatorade  Patient Instructions  The night before surgery:  No food after midnight. ONLY clear liquids after midnight  The day of surgery (if you do NOT have diabetes):  Drink ONE (1) Pre-Surgery Clear Ensure by 06:00 AM the morning of surgery. Drink in one sitting. Do not sip.  This drink was given to you during your hospital  pre-op appointment visit.  Nothing else to drink after completing the  Pre-Surgery Clear Ensure.          If you have questions, please contact your surgeon's office.      Take these medicines the morning of surgery with A SIP OF WATER  pravastatin (PRAVACHOL) traMADol (ULTRAM) - if needed  As of today, STOP taking any Aspirin (unless otherwise instructed by your surgeon) Aleve, Naproxen, Ibuprofen, Motrin, Advil, Goody's, BC's, all herbal medications, fish oil, and all vitamins.                        Do NOT Smoke (Tobacco/Vaping)  24 hours prior to your procedure If you use a CPAP at night, you may bring all equipment for your overnight stay.   Contacts, glasses, dentures or bridgework may not be worn into surgery, please bring cases for these belongings   For patients admitted to the hospital, discharge time will be determined by your treatment team.   Patients discharged the day of  surgery will not be allowed to drive home, and someone needs to stay with them for 24 hours.  ONLY 1 SUPPORT PERSON MAY BE PRESENT WHILE YOU ARE IN SURGERY. IF YOU ARE TO BE ADMITTED ONCE YOU ARE IN YOUR ROOM YOU WILL BE ALLOWED TWO (2) VISITORS.  Minor children may have two parents present. Special consideration for safety and communication needs will be reviewed on a case by case basis.  Special instructions:    Oral Hygiene is also important to reduce your risk of infection.  Remember - BRUSH YOUR TEETH THE MORNING OF SURGERY WITH YOUR REGULAR TOOTHPASTE   [place]- Preparing For Surgery  Before surgery, you can play an important role. Because skin is not sterile, your skin needs to be as free of germs as possible. You can reduce the number of germs on your skin by washing with CHG (chlorahexidine gluconate) Soap before surgery.  CHG is an antiseptic cleaner which kills germs and bonds with the skin to continue killing germs even after washing.     Please do not use if you have an allergy to CHG or antibacterial soaps. If your skin becomes reddened/irritated stop using the CHG.  Do not shave (including legs and underarms) for at least 48 hours prior to first CHG shower. It is OK to shave your face.  Please follow these instructions carefully.     Shower the [employer] BEFORE SURGERY  and the MORNING OF SURGERY with CHG Soap.   If you chose to wash your hair, wash your hair first as usual with your normal shampoo. After you shampoo, rinse your hair and body thoroughly to remove the shampoo.  Then [employer] and genitals (private parts) with your normal soap and rinse thoroughly to remove soap.  After that Use CHG Soap as you would any other liquid soap. You can apply CHG directly to the skin and wash gently with a scrungie or a clean washcloth.   Apply the CHG Soap to your body ONLY FROM THE NECK DOWN.  Do not use on open wounds or open sores. Avoid contact with your eyes, ears, mouth and  genitals (private parts). Wash Face and genitals (private parts)  with your normal soap.   Wash thoroughly, paying special attention to the area where your surgery will be performed.  Thoroughly rinse your body with warm water from the neck down.  DO NOT shower/wash with your normal soap after using and rinsing off the CHG Soap.  Pat yourself dry with a CLEAN TOWEL.  Wear CLEAN PAJAMAS to bed the night before surgery  Place CLEAN SHEETS on your bed the night before your surgery  DO NOT SLEEP WITH PETS.   Day of Surgery:  Take a shower with CHG soap. Wear Clean/Comfortable clothing the morning of surgery Do not apply any deodorants/lotions.   Remember to brush your teeth WITH YOUR REGULAR TOOTHPASTE. Do not wear jewelry  Do not wear lotions, powders, colognes, or deodorant. Men may shave face and neck. Do not bring valuables to the hospital. [facility] is not responsible for any belongings or valuables.   Please read over the following fact sheets that you were given.

## 2020-12-02 ENCOUNTER — Other Ambulatory Visit: Payer: Self-pay

## 2020-12-02 ENCOUNTER — Encounter (HOSPITAL_COMMUNITY)
Admission: RE | Admit: 2020-12-02 | Discharge: 2020-12-02 | Disposition: A | Discharge status: Home / Self Care | Payer: Medicare Other | Source: Ambulatory Visit | Attending: General Surgery | Admitting: General Surgery

## 2020-12-02 ENCOUNTER — Encounter (HOSPITAL_COMMUNITY): Payer: Self-pay

## 2020-12-02 DIAGNOSIS — Z01812 Encounter for preprocedural laboratory examination: Secondary | ICD-10-CM | POA: Insufficient documentation

## 2020-12-02 LAB — CBC
HCT: 39.9 % (ref 39.0–52.0)
Hemoglobin: 13.3 g/dL (ref 13.0–17.0)
MCH: 30.2 pg (ref 26.0–34.0)
MCHC: 33.3 g/dL (ref 30.0–36.0)
MCV: 90.7 fL (ref 80.0–100.0)
Platelets: 141 10*3/uL — ABNORMAL LOW (ref 150–400)
RBC: 4.4 MIL/uL (ref 4.22–5.81)
RDW: 12.8 % (ref 11.5–15.5)
WBC: 5.1 10*3/uL (ref 4.0–10.5)
nRBC: 0 % (ref 0.0–0.2)

## 2020-12-02 LAB — BASIC METABOLIC PANEL
Anion gap: 9 (ref 5–15)
BUN: 13 mg/dL (ref 8–23)
CO2: 30 mmol/L (ref 22–32)
Calcium: 9 mg/dL (ref 8.9–10.3)
Chloride: 99 mmol/L (ref 98–111)
Creatinine, Ser: 0.89 mg/dL (ref 0.61–1.24)
GFR, Estimated: >60 mL/min (ref >60)
Glucose, Bld: 101 mg/dL — ABNORMAL HIGH (ref 70–99)
Potassium: 3.5 mmol/L (ref 3.5–5.1)
Sodium: 138 mmol/L (ref 135–145)

## 2020-12-02 NOTE — Progress Notes (Signed)
PCP - Jill Alexanders Cardiologist - denies Oncologist: Dr. Irene Limbo  PPM/ICD - denies   Chest x-ray - n/a EKG - 08/30/20 Stress Test - denies ECHO - 07/16/20 Cardiac Cath - denies  Sleep Study - denies   No diabetes  As of today, STOP taking any Aspirin (unless otherwise instructed by your surgeon) Aleve, Naproxen, Ibuprofen, Motrin, Advil, Goody's, BC's, all herbal medications, fish oil, and all vitamins.  ERAS Protcol -yes PRE-SURGERY Ensure or G2- no  COVID TEST- no covid test needed, ambulatory surgery   Anesthesia review: no  Patient denies shortness of breath, fever, cough and chest pain at PAT appointment   All instructions explained to the patient, with a verbal understanding of the material. Patient agrees to go over the instructions while at home for a better understanding. Patient also instructed to self quarantine after being tested for COVID-19. The opportunity to ask questions was provided.

## 2020-12-02 NOTE — Pre-Procedure Instructions (Signed)
Surgical Instructions   Your procedure is scheduled on Tuesday, September 20th. Report to [facility] Main Entrance "A" at 07:00 A.M., then check in with the Admitting office. Call this number if you have problems the morning of surgery: [phone]   If you have any questions prior to your surgery date call [phone]: Open Monday-Friday 8am-4pm   Remember: Do not eat after midnight the night before your surgery  You may drink clear liquids until 06:00 AM the morning of your surgery.   Clear liquids allowed are: Water, Non-Citrus Juices (without pulp), Carbonated Beverages, Clear Tea, Black Coffee ONLY (NO MILK, CREAM OR POWDERED CREAMER of any kind), and Gatorade      Take these medicines the morning of surgery with A SIP OF WATER  pravastatin (PRAVACHOL) traMADol (ULTRAM) - if needed  As of today, STOP taking any Aspirin (unless otherwise instructed by your surgeon) Aleve, Naproxen, Ibuprofen, Motrin, Advil, Goody's, BC's, all herbal medications, fish oil, and all vitamins.                        Do NOT Smoke (Tobacco/Vaping)  24 hours prior to your procedure If you use a CPAP at night, you may bring all equipment for your overnight stay.   Contacts, glasses, dentures or bridgework may not be worn into surgery, please bring cases for these belongings   For patients admitted to the hospital, discharge time will be determined by your treatment team.   Patients discharged the day of surgery will not be allowed to drive home, and someone needs to stay with them for 24 hours.  ONLY 1 SUPPORT PERSON MAY BE PRESENT WHILE YOU ARE IN SURGERY. IF YOU ARE TO BE ADMITTED ONCE YOU ARE IN YOUR ROOM YOU WILL BE ALLOWED TWO (2) VISITORS.  Minor children may have two parents present. Special consideration for safety and communication needs will be reviewed on a case by case basis.  Special instructions:    Oral Hygiene is also important to reduce your risk of infection.  Remember - BRUSH  YOUR TEETH THE MORNING OF SURGERY WITH YOUR REGULAR TOOTHPASTE   [place]- Preparing For Surgery  Before surgery, you can play an important role. Because skin is not sterile, your skin needs to be as free of germs as possible. You can reduce the number of germs on your skin by washing with CHG (chlorahexidine gluconate) Soap before surgery.  CHG is an antiseptic cleaner which kills germs and bonds with the skin to continue killing germs even after washing.     Please do not use if you have an allergy to CHG or antibacterial soaps. If your skin becomes reddened/irritated stop using the CHG.  Do not shave (including legs and underarms) for at least 48 hours prior to first CHG shower. It is OK to shave your face.  Please follow these instructions carefully.     Shower the NIGHT BEFORE SURGERY and the MORNING OF SURGERY with CHG Soap.   If you chose to wash your hair, wash your hair first as usual with your normal shampoo. After you shampoo, rinse your hair and body thoroughly to remove the shampoo.  Then [employer] and genitals (private parts) with your normal soap and rinse thoroughly to remove soap.  After that Use CHG Soap as you would any other liquid soap. You can apply CHG directly to the skin and wash gently with a scrungie or a clean washcloth.   Apply the CHG  Soap to your body ONLY FROM THE NECK DOWN.  Do not use on open wounds or open sores. Avoid contact with your eyes, ears, mouth and genitals (private parts). Wash Face and genitals (private parts)  with your normal soap.   Wash thoroughly, paying special attention to the area where your surgery will be performed.  Thoroughly rinse your body with warm water from the neck down.  DO NOT shower/wash with your normal soap after using and rinsing off the CHG Soap.  Pat yourself dry with a CLEAN TOWEL.  Wear CLEAN PAJAMAS to bed the night before surgery  Place CLEAN SHEETS on your bed the night before your surgery  DO NOT SLEEP  WITH PETS.   Day of Surgery: Take a shower with CHG soap. Wear Clean/Comfortable clothing the morning of surgery Do not apply any deodorants/lotions.   Remember to brush your teeth WITH YOUR REGULAR TOOTHPASTE. Do not wear jewelry  Do not wear lotions, powders, colognes, or deodorant. Men may shave face and neck. Do not bring valuables to the hospital. [facility] is not responsible for any belongings or valuables.   Please read over the following fact sheets that you were given.

## 2020-12-03 LAB — CANCER ANTIGEN 19-9: CA 19-9: 71 U/mL — ABNORMAL HIGH (ref 0–35)

## 2020-12-06 DIAGNOSIS — L570 Actinic keratosis: Secondary | ICD-10-CM | POA: Diagnosis not present

## 2020-12-06 DIAGNOSIS — Z85828 Personal history of other malignant neoplasm of skin: Secondary | ICD-10-CM | POA: Diagnosis not present

## 2020-12-06 DIAGNOSIS — L578 Other skin changes due to chronic exposure to nonionizing radiation: Secondary | ICD-10-CM | POA: Diagnosis not present

## 2020-12-06 DIAGNOSIS — D2271 Melanocytic nevi of right lower limb, including hip: Secondary | ICD-10-CM | POA: Diagnosis not present

## 2020-12-14 ENCOUNTER — Encounter (HOSPITAL_COMMUNITY)
Admission: RE | Disposition: A | Discharge status: Home / Self Care | Payer: Self-pay | Source: Home / Self Care | Attending: General Surgery

## 2020-12-14 ENCOUNTER — Ambulatory Visit (HOSPITAL_COMMUNITY): Payer: Medicare Other | Admitting: Anesthesiology

## 2020-12-14 ENCOUNTER — Other Ambulatory Visit: Payer: Self-pay

## 2020-12-14 ENCOUNTER — Ambulatory Visit (HOSPITAL_COMMUNITY)
Admission: RE | Admit: 2020-12-14 | Discharge: 2020-12-14 | Disposition: A | Discharge status: Home / Self Care | Payer: Medicare Other | Attending: General Surgery | Admitting: General Surgery

## 2020-12-14 ENCOUNTER — Encounter (HOSPITAL_COMMUNITY): Payer: Self-pay | Admitting: General Surgery

## 2020-12-14 DIAGNOSIS — Z79899 Other long term (current) drug therapy: Secondary | ICD-10-CM | POA: Insufficient documentation

## 2020-12-14 DIAGNOSIS — I700 Atherosclerosis of aorta: Secondary | ICD-10-CM | POA: Diagnosis not present

## 2020-12-14 DIAGNOSIS — Z8546 Personal history of malignant neoplasm of prostate: Secondary | ICD-10-CM | POA: Insufficient documentation

## 2020-12-14 DIAGNOSIS — Z9889 Other specified postprocedural states: Secondary | ICD-10-CM | POA: Insufficient documentation

## 2020-12-14 DIAGNOSIS — K822 Perforation of gallbladder: Secondary | ICD-10-CM | POA: Diagnosis not present

## 2020-12-14 DIAGNOSIS — K8020 Calculus of gallbladder without cholecystitis without obstruction: Secondary | ICD-10-CM | POA: Insufficient documentation

## 2020-12-14 DIAGNOSIS — Z888 Allergy status to other drugs, medicaments and biological substances status: Secondary | ICD-10-CM | POA: Insufficient documentation

## 2020-12-14 DIAGNOSIS — Z880 Allergy status to penicillin: Secondary | ICD-10-CM | POA: Diagnosis not present

## 2020-12-14 DIAGNOSIS — E785 Hyperlipidemia, unspecified: Secondary | ICD-10-CM | POA: Diagnosis not present

## 2020-12-14 DIAGNOSIS — Z87891 Personal history of nicotine dependence: Secondary | ICD-10-CM | POA: Insufficient documentation

## 2020-12-14 DIAGNOSIS — C8510 Unspecified B-cell lymphoma, unspecified site: Secondary | ICD-10-CM | POA: Insufficient documentation

## 2020-12-14 DIAGNOSIS — C8310 Mantle cell lymphoma, unspecified site: Secondary | ICD-10-CM | POA: Diagnosis not present

## 2020-12-14 DIAGNOSIS — C23 Malignant neoplasm of gallbladder: Secondary | ICD-10-CM | POA: Diagnosis not present

## 2020-12-14 DIAGNOSIS — K828 Other specified diseases of gallbladder: Secondary | ICD-10-CM | POA: Diagnosis not present

## 2020-12-14 DIAGNOSIS — K829 Disease of gallbladder, unspecified: Secondary | ICD-10-CM | POA: Diagnosis present

## 2020-12-14 HISTORY — PX: CHOLECYSTECTOMY: SHX55

## 2020-12-14 SURGERY — LAPAROSCOPIC CHOLECYSTECTOMY
Anesthesia: General | Site: Abdomen

## 2020-12-14 MED ORDER — OXYCODONE HCL 5 MG/5ML PO SOLN: 5.0000 mg | Freq: Once | ORAL | Status: DC | PRN

## 2020-12-14 MED ORDER — ORAL CARE MOUTH RINSE: 15.0000 mL | Freq: Once | OROMUCOSAL | Status: AC

## 2020-12-14 MED ORDER — FENTANYL CITRATE (PF) 250 MCG/5ML IJ SOLN
INTRAMUSCULAR | Status: AC
  Filled 2020-12-14: qty 5

## 2020-12-14 MED ORDER — LIDOCAINE 2% (20 MG/ML) 5 ML SYRINGE
INTRAMUSCULAR | Status: DC | PRN
  Administered 2020-12-14: 60 mg via INTRAVENOUS

## 2020-12-14 MED ORDER — ONDANSETRON HCL 4 MG/2ML IJ SOLN
INTRAMUSCULAR | Status: DC | PRN
  Administered 2020-12-14: 4 mg via INTRAVENOUS

## 2020-12-14 MED ORDER — OXYCODONE HCL 5 MG PO TABS: 5.0000 mg | ORAL_TABLET | Freq: Once | ORAL | Status: DC | PRN

## 2020-12-14 MED ORDER — OXYCODONE HCL 5 MG PO TABS: 2.5000 mg | ORAL_TABLET | Freq: Four times a day (QID) | ORAL | 0 refills | Status: DC | PRN

## 2020-12-14 MED ORDER — SUGAMMADEX SODIUM 200 MG/2ML IV SOLN
INTRAVENOUS | Status: DC | PRN
  Administered 2020-12-14: 180 mg via INTRAVENOUS

## 2020-12-14 MED ORDER — CIPROFLOXACIN IN D5W 400 MG/200ML IV SOLN
400.0000 mg | INTRAVENOUS | Status: DC
  Filled 2020-12-14: qty 200

## 2020-12-14 MED ORDER — STERILE WATER FOR IRRIGATION IR SOLN
Status: DC | PRN
  Administered 2020-12-14: 1000 mL

## 2020-12-14 MED ORDER — PROPOFOL 10 MG/ML IV BOLUS
INTRAVENOUS | Status: DC | PRN
  Administered 2020-12-14: 20 mg via INTRAVENOUS
  Administered 2020-12-14: 70 mg via INTRAVENOUS
  Administered 2020-12-14: 20 mg via INTRAVENOUS
  Administered 2020-12-14: 30 mg via INTRAVENOUS

## 2020-12-14 MED ORDER — LACTATED RINGERS IV SOLN: INTRAVENOUS | Status: DC

## 2020-12-14 MED ORDER — FENTANYL CITRATE (PF) 250 MCG/5ML IJ SOLN
INTRAMUSCULAR | Status: DC | PRN
  Administered 2020-12-14: 50 ug via INTRAVENOUS
  Administered 2020-12-14: 100 ug via INTRAVENOUS
  Administered 2020-12-14: 50 ug via INTRAVENOUS

## 2020-12-14 MED ORDER — BUPIVACAINE-EPINEPHRINE (PF) 0.25% -1:200000 IJ SOLN
INTRAMUSCULAR | Status: AC
  Filled 2020-12-14: qty 30

## 2020-12-14 MED ORDER — CIPROFLOXACIN IN D5W 400 MG/200ML IV SOLN
INTRAVENOUS | Status: DC | PRN
  Administered 2020-12-14: 400 mg via INTRAVENOUS

## 2020-12-14 MED ORDER — ROCURONIUM BROMIDE 10 MG/ML (PF) SYRINGE
PREFILLED_SYRINGE | INTRAVENOUS | Status: DC | PRN
  Administered 2020-12-14: 60 mg via INTRAVENOUS

## 2020-12-14 MED ORDER — FENTANYL CITRATE (PF) 100 MCG/2ML IJ SOLN
INTRAMUSCULAR | Status: AC
  Filled 2020-12-14: qty 2

## 2020-12-14 MED ORDER — ONDANSETRON HCL 4 MG/2ML IJ SOLN: 4.0000 mg | Freq: Once | INTRAMUSCULAR | Status: DC | PRN

## 2020-12-14 MED ORDER — FENTANYL CITRATE (PF) 100 MCG/2ML IJ SOLN
25.0000 ug | INTRAMUSCULAR | Status: DC | PRN
  Administered 2020-12-14 (×2): 25 ug via INTRAVENOUS

## 2020-12-14 MED ORDER — CHLORHEXIDINE GLUCONATE 0.12 % MT SOLN
15.0000 mL | Freq: Once | OROMUCOSAL | Status: AC
  Administered 2020-12-14: 15 mL via OROMUCOSAL

## 2020-12-14 MED ORDER — SODIUM CHLORIDE 0.9 % IR SOLN
Status: DC | PRN
  Administered 2020-12-14: 1000 mL

## 2020-12-14 MED ORDER — EPHEDRINE SULFATE-NACL 50-0.9 MG/10ML-% IV SOSY
PREFILLED_SYRINGE | INTRAVENOUS | Status: DC | PRN
  Administered 2020-12-14: 5 mg via INTRAVENOUS
  Administered 2020-12-14 (×2): 10 mg via INTRAVENOUS

## 2020-12-14 MED ORDER — ACETAMINOPHEN 500 MG PO TABS
1000.0000 mg | ORAL_TABLET | ORAL | Status: AC
  Administered 2020-12-14: 1000 mg via ORAL
  Filled 2020-12-14: qty 2

## 2020-12-14 MED ORDER — 0.9 % SODIUM CHLORIDE (POUR BTL) OPTIME
TOPICAL | Status: DC | PRN
  Administered 2020-12-14: 1000 mL

## 2020-12-14 MED ORDER — BUPIVACAINE-EPINEPHRINE (PF) 0.25% -1:200000 IJ SOLN
INTRAMUSCULAR | Status: DC | PRN
  Administered 2020-12-14: 9 mL

## 2020-12-14 MED ORDER — CHLORHEXIDINE GLUCONATE CLOTH 2 % EX PADS: 6.0000 | MEDICATED_PAD | Freq: Once | CUTANEOUS | Status: DC

## 2020-12-14 SURGICAL SUPPLY — 43 items
ADH SKN CLS APL DERMABOND .7 (GAUZE/BANDAGES/DRESSINGS) ×1
APL PRP STRL LF DISP 70% ISPRP (MISCELLANEOUS) ×1
APPLIER CLIP ROT 10 11.4 M/L (STAPLE) ×2
APR CLP MED LRG 11.4X10 (STAPLE) ×1
BAG SPEC RTRVL 10 TROC 200 (ENDOMECHANICALS) ×1
BLADE CLIPPER SURG (BLADE) ×2 IMPLANT
CANISTER SUCT 3000ML PPV (MISCELLANEOUS) ×2 IMPLANT
CHLORAPREP W/TINT 26 (MISCELLANEOUS) ×2 IMPLANT
CLIP APPLIE ROT 10 11.4 M/L (STAPLE) ×1 IMPLANT
COVER SURGICAL LIGHT HANDLE (MISCELLANEOUS) ×2 IMPLANT
DECANTER SPIKE VIAL GLASS SM (MISCELLANEOUS) ×2 IMPLANT
DERMABOND ADVANCED (GAUZE/BANDAGES/DRESSINGS) ×1
DERMABOND ADVANCED .7 DNX12 (GAUZE/BANDAGES/DRESSINGS) ×1 IMPLANT
ELECT REM PT RETURN 9FT ADLT (ELECTROSURGICAL) ×2
ELECTRODE REM PT RTRN 9FT ADLT (ELECTROSURGICAL) ×1 IMPLANT
GLOVE SURG ENC MOIS LTX SZ6 (GLOVE) ×2 IMPLANT
GLOVE SURG UNDER LTX SZ6.5 (GLOVE) ×2 IMPLANT
GOWN STRL REUS W/ TWL LRG LVL3 (GOWN DISPOSABLE) ×3 IMPLANT
GOWN STRL REUS W/TWL 2XL LVL3 (GOWN DISPOSABLE) ×2 IMPLANT
GOWN STRL REUS W/TWL LRG LVL3 (GOWN DISPOSABLE) ×6
KIT BASIN OR (CUSTOM PROCEDURE TRAY) ×2 IMPLANT
KIT TURNOVER KIT B (KITS) ×2 IMPLANT
L-HOOK LAP DISP 36CM (ELECTROSURGICAL) ×2
LHOOK LAP DISP 36CM (ELECTROSURGICAL) ×1 IMPLANT
NS IRRIG 1000ML POUR BTL (IV SOLUTION) ×2 IMPLANT
PAD ARMBOARD 7.5X6 YLW CONV (MISCELLANEOUS) ×2 IMPLANT
PENCIL BUTTON HOLSTER BLD 10FT (ELECTRODE) ×2 IMPLANT
POUCH RETRIEVAL ECOSAC 10 (ENDOMECHANICALS) ×1 IMPLANT
POUCH RETRIEVAL ECOSAC 10MM (ENDOMECHANICALS) ×2
SCISSORS LAP 5X35 DISP (ENDOMECHANICALS) ×2 IMPLANT
SET IRRIG TUBING LAPAROSCOPIC (IRRIGATION / IRRIGATOR) ×2 IMPLANT
SET TUBE SMOKE EVAC HIGH FLOW (TUBING) ×2 IMPLANT
SLEEVE ENDOPATH XCEL 5M (ENDOMECHANICALS) ×2 IMPLANT
SUT MNCRL AB 4-0 PS2 18 (SUTURE) ×2 IMPLANT
SUT VICRYL 0 AB UR-6 (SUTURE) ×2 IMPLANT
TOWEL GREEN STERILE (TOWEL DISPOSABLE) ×2 IMPLANT
TOWEL GREEN STERILE FF (TOWEL DISPOSABLE) ×2 IMPLANT
TRAY LAPAROSCOPIC MC (CUSTOM PROCEDURE TRAY) ×2 IMPLANT
TROCAR XCEL BLUNT TIP 100MML (ENDOMECHANICALS) ×2 IMPLANT
TROCAR XCEL NON-BLD 11X100MML (ENDOMECHANICALS) ×2 IMPLANT
TROCAR XCEL NON-BLD 5MMX100MML (ENDOMECHANICALS) ×2 IMPLANT
WARMER LAPAROSCOPE (MISCELLANEOUS) ×2 IMPLANT
WATER STERILE IRR 1000ML POUR (IV SOLUTION) ×2 IMPLANT

## 2020-12-14 NOTE — Transfer of Care (Signed)
Immediate Anesthesia Transfer of Care Note  Patient: DEWAUN KINZLER  Procedure(s) Performed: LAPAROSCOPIC CHOLECYSTECTOMY (Abdomen)  Patient Location: PACU  Anesthesia Type:General  Level of Consciousness: drowsy and patient cooperative  Airway & Oxygen Therapy: Patient Spontanous Breathing and Patient connected to face mask oxygen  Post-op Assessment: Report given to RN and Post -op Vital signs reviewed and stable  Post vital signs: Reviewed and stable  Last Vitals:  Vitals Value Taken Time  BP 151/67 12/14/20 1038  Temp    Pulse 59 12/14/20 1039  Resp 9 12/14/20 1039  SpO2 100 % 12/14/20 1039  Vitals shown include unvalidated device data.  Last Pain:  Vitals:   12/14/20 0712  TempSrc:   PainSc: 0-No pain         Complications: No notable events documented.

## 2020-12-14 NOTE — Op Note (Signed)
Laparoscopic Cholecystectomy  Indications: This patient presents with gallbladder mass and many gallstones and will undergo laparoscopic cholecystectomy. The patient has 2 types of lymphoma with one in remission.  He had a gallbladder mass that is hot on pet scan.  Given the indolent nature of the lymphoma and the rapidity of growth of the gallbladder mass, decision was made to do lap chole in order to decrease speed of growth of gallbladder tumor.  He is too frail for liver resection so that was not entertained.    Pre-operative Diagnosis: gallbladder mass and gallstones  Post-operative Diagnosis: Same  Surgeon: Stark Klein   Assistants: Ewell Poe, RNFA  Anesthesia: General endotracheal anesthesia and local  ASA Class: 3  Procedure Details  The patient was seen again in the Holding Room. The risks, benefits, complications, treatment options, and expected outcomes were discussed with the patient. The possibilities of  bleeding, recurrent infection, damage to nearby structures, the need for additional procedures, failure to diagnose a condition, the possible need to convert to an open procedure, and creating a complication requiring transfusion or operation were discussed with the patient. The likelihood of improving the patient's symptoms with return to their baseline status is good.    The patient and/or family concurred with the proposed plan, giving informed consent. The site of surgery properly noted. The patient was taken to Operating Room, and the procedure verified as Laparoscopic Cholecystectomy with Intraoperative Cholangiogram. A Time Out was held and the above information confirmed.  Prior to the induction of general anesthesia, antibiotic prophylaxis was administered. General endotracheal anesthesia was then administered and tolerated well. After the induction, the abdomen was prepped with Chloraprep and draped in the sterile fashion. The patient was positioned in the supine  position.  Local anesthetic agent was injected into the skin near the umbilicus and a curvilinear transverse incision was made with a #11 blade. I dissected down to the abdominal fascia with blunt dissection.  The fascia was incised vertically and we entered the peritoneal cavity bluntly.  A pursestring suture of 0-Vicryl was placed around the fascial opening.  The Hasson cannula was inserted and secured with the stay suture.  Pneumoperitoneum was then created with CO2 and tolerated well without any adverse changes in the patient's vital signs. An 11-mm port was placed in the subxiphoid position.  Two 5-mm ports were placed in the right upper quadrant. All skin incisions were infiltrated with a local anesthetic agent before making the incision and placing the trocars.   We positioned the patient in reverse Trendelenburg, tilted slightly to the patient's left.  The omentum was stuck to the fundus of the gallbladder.  This was taken down with the cautery, taking care to leave the adherent omentum to the gallbladder.  The gallbladder was identified, the fundus grasped below the mass and retracted cephalad. Adhesions were lysed bluntly and with the electrocautery where indicated, taking care not to injure any adjacent organs or viscus. The infundibulum was grasped and retracted laterally, exposing the peritoneum overlying the triangle of Calot. This was then divided and exposed in a blunt fashion. A critical view of the cystic duct and cystic artery was obtained with cautery and blunt dissection.  The cystic duct was clearly identified and bluntly dissected circumferentially.   The cystic duct was then ligated with clips and divided. The cystic artery was ligated with clips and divided as well.   The gallbladder was dissected from the liver bed in retrograde fashion with the electrocautery. The gallbladder was removed  and placed in an [place].  The gallbladder and bag were then removed through the umbilical port  site.  The liver bed was irrigated and inspected. Hemostasis was achieved with the electrocautery. Copious irrigation was utilized and was repeatedly aspirated until clear.    We again inspected the right upper quadrant for hemostasis.  Pneumoperitoneum was released as we removed the trocars.   The pursestring suture was used to close the umbilical fascia.  Additional 0-0 vicryl sutures were placed to ensure complete closure of the umbilical fascial defect.  4-0 Monocryl was used to close the skin.   The skin was cleaned and dry, and Dermabond was applied. The patient was then extubated and brought to the recovery room in stable condition. Instrument, sponge, and needle counts were correct at closure and at the conclusion of the case.   Findings: Mass in gallbladder fundus.  No gross invasion of the liver.  No evidence of carcinomatosis.  Numerous gallstones in gallbladder.  No spillage of stones.  Small amount of bile spillage.    Estimated Blood Loss: min         Drains: none          Specimens: Gallbladder to pathology       Complications: None; patient tolerated the procedure well.         Disposition: PACU - hemodynamically stable.         Condition: stable

## 2020-12-14 NOTE — Anesthesia Procedure Notes (Signed)
Procedure Name: Intubation Date/Time: 12/14/2020 9:11 AM Performed by: Bryson Corona, CRNA Pre-anesthesia Checklist: Patient identified, Emergency Drugs available, Suction available and Patient being monitored Patient Re-evaluated:Patient Re-evaluated prior to induction Oxygen Delivery Method: Circle System Utilized Preoxygenation: Pre-oxygenation with 100% oxygen Induction Type: IV induction Ventilation: Mask ventilation without difficulty Laryngoscope Size: Mac and 3 Grade View: Grade I Tube type: Oral Tube size: 7.5 mm Number of attempts: 1 Airway Equipment and Method: Stylet and Oral airway Placement Confirmation: ETT inserted through vocal cords under direct vision, positive ETCO2 and breath sounds checked- equal and bilateral Secured at: 23 cm Tube secured with: Tape Dental Injury: Teeth and Oropharynx as per pre-operative assessment

## 2020-12-14 NOTE — Discharge Instructions (Addendum)
[facility] ______[facility] LAPAROSCOPIC SURGERY: POST OP INSTRUCTIONS Always review your discharge instruction sheet given to you by the facility where your surgery was performed. IF YOU HAVE DISABILITY OR FAMILY LEAVE FORMS, YOU MUST BRING THEM TO THE OFFICE FOR PROCESSING.   DO NOT GIVE THEM TO YOUR DOCTOR.  A prescription for pain medication may be given to you upon discharge.  Take your pain medication as prescribed, if needed.  If narcotic pain medicine is not needed, then you may take acetaminophen (Tylenol) or ibuprofen (Advil) as needed. Take your usually prescribed medications unless otherwise directed. If you need a refill on your pain medication, please contact your pharmacy.  They will contact our office to request authorization. Prescriptions will not be filled after 5pm or on week-ends. You should follow a light diet the first few days after arrival home, such as soup and crackers, etc.  Be sure to include lots of fluids daily. Most patients will experience some swelling and bruising in the area of the incisions.  Ice packs will help.  Swelling and bruising can take several days to resolve.  It is common to experience some constipation if taking pain medication after surgery.  Increasing fluid intake and taking a stool softener (such as Colace) will usually help or prevent this problem from occurring.  A mild laxative (Milk of Magnesia or Miralax) should be taken according to package instructions if there are no bowel movements after 48 hours. If your surgeon used skin glue on the incision, you may shower in 48 hours.  The glue will flake off over the next 2-3 weeks.  Any sutures or staples will be removed at the office during your follow-up visit. ACTIVITIES:  You may resume regular (light) daily activities beginning the next day--such as daily self-care, walking, climbing stairs--gradually increasing activities as tolerated.  You may have sexual intercourse when it is  comfortable.  Refrain from any heavy lifting or straining until approved by your doctor. You may drive when you are no longer taking prescription pain medication, you can comfortably wear a seatbelt, and you can safely maneuver your car and apply brakes. RETURN TO WORK:  _____________n/a_____________________________________________ Dennis Bast should see your doctor in the office for a follow-up appointment approximately 2-3 weeks after your surgery.  Make sure that you call for this appointment within a day or two after you arrive home to insure a convenient appointment time. OTHER INSTRUCTIONS: __________________________________________________________________________________________________________________________ __________________________________________________________________________________________________________________________ WHEN TO CALL YOUR DOCTOR: Fever over 101.0 Inability to urinate Continued bleeding from incision. Increased pain, redness, or drainage from the incision. Increasing abdominal pain  The clinic staff is available to answer your questions during regular business hours.  Please don't hesitate to call and ask to speak to one of the nurses for clinical concerns.  If you have a medical emergency, go to the nearest emergency room or call 911.  A surgeon from [facility] is always on call at the hospital. [address] [place], [place], [place]  [address] ? [address], [place], [place]   [address] [phone] ? [phone] ? FAX [phone] Web site: [email or web address]

## 2020-12-14 NOTE — H&P (Signed)
Patient presents with   Advice Only   Referring MD: Irene Limbo  HISTORY: Patient is an 87 year old male who is referred by Dr. Irene Limbo for a potential gallbladder malignancy. The patient is known to our practice for having an incarcerated inguinal hernia that was repaired 2 months ago by Dr. Windle Guard. He is doing well from that standpoint. However, he had a follow-up PET scan to evaluate his mantle cell lymphoma. On follow-up, a concerning location at the fundus of the gallbladder was larger and little very brightly compared to being indeterminant before. MRI confirmed that there is a mass in this location that was 1.7 cm in greatest dimension. Of note, the patient has no upper abdominal symptoms. He denies right upper quadrant pain or discomfort. He does not have any epigastric symptoms or nausea after eating. He has a slight twinge of soreness at his right groin incision that has almost completely resolved.  He is on maintenance chemotherapy every 2 months for mantle cell lymphoma. His previous lymphoma is in remission.  PET 10/01/20 IMPRESSION: Overall assessment as Deauville criteria 5 with generalized increase in metabolic activity associated with lymph nodes which show little change in size in the neck, chest, abdomen and pelvis.   Lesion at the site of metabolic activity in the liver with increasing metabolic activity likely related to hepatic involvement.   Marked increase in thickening of the gallbladder fundus adjacent to the above finding. This is not clearly hypermetabolic accounting for some respiratory variation but is suspicious given enlargement. Consider MRI with without contrast or contrasted CT for more complete assessment of the above findings.   No signs of increased metabolism in the spleen.   Post RIGHT inguinal herniorrhaphy.   Aortic atherosclerosis.   Coronary artery calcification.   Aortic Atherosclerosis (ICD10-I70.0).  MRI abd 10/26/20 IMPRESSION: 1. As noted on  prior examination, there is marked, focal wall thickening at the gallbladder fundus, measuring approximately 1.7 cm with associated heterogeneous contrast enhancement. This finding is concerning for gallbladder malignancy given increased over time and previously associated FDG avidity. 2. No definite liver lesion to correspond to suspected FDG avidity within the anterior right lobe of the liver adjacent to the gallbladder fundus. Upon review of prior PET-CT examination, this appears to have actually reflected FDG avidity of the gallbladder fundus misregistered to the liver due to breath motion artifact. 3. Cholelithiasis. No biliary ductal dilatation. 4. Prominent retroperitoneal lymph nodes, not significantly changed compared to prior and in keeping with known lymphoma.   Aortic Atherosclerosis (ICD10-I70.0).  Past Medical History:  Diagnosis Date   Arthritis   Glaucoma (increased eye pressure)   History of cancer   Hypertension   Past Surgical History:  Procedure Laterality Date   HERNIA REPAIR   Current Outpatient Medications  Medication Sig Dispense Refill   pravastatin (PRAVACHOL) 40 MG tablet Take 1 tablet by mouth once daily   losartan-hydrochlorothiazide (HYZAAR) 50-12.5 mg tablet Take 1 tablet by mouth once daily   No current facility-administered medications for this visit.   Allergies  Allergen Reactions   Lisinopril Cough   Penicillins Rash  Did it involve swelling of the face/tongue/throat, SOB, or low BP? No Did it involve sudden or severe rash/hives, skin peeling, or any reaction on the inside of your mouth or nose? Yes Did you need to seek medical attention at a hospital or doctor's office? Yes When did it last happen?   50  years If all above answers are "NO", may proceed with cephalosporin use.  Family History  Problem Relation Age of Onset   Colon cancer Father   Social History   Socioeconomic History   Marital status: Married  Tobacco Use    Smoking status: Never Smoker   Smokeless tobacco: Never Used  Substance and Sexual Activity   Alcohol use: Yes  Comment: 4/5 per week   Drug use: Never   REVIEW OF SYSTEMS - PERTINENT POSITIVES ONLY: A complete review of systems negative other than HPI and PMH  EXAM: Vitals:  BP: 128/62  Pulse: 68  Temp: 36.5 C (97.7 F)     Gen: No acute distress. Well nourished and well groomed.  Neurological: Alert and oriented to person, place, and time. Coordination normal.  Head: Normocephalic and atraumatic.  Eyes: Conjunctivae are normal. Pupils are equal, round, and reactive to light. No scleral icterus.  Neck: Normal range of motion. Neck supple. No tracheal deviation or thyromegaly present.  Respiratory: Effort normal. No respiratory distress. No chest wall tenderness. GI: Soft. Bowel sounds are normal. The abdomen is soft and nontender. There is no rebound and no guarding. Small umbilical hernia at prior prostatectomy incision. Musculoskeletal: Normal range of motion. Extremities are nontender.  Lymphadenopathy: No cervical, preauricular, postauricular or axillary adenopathy is present Skin: Skin is warm and dry. No rash noted. No diaphoresis. No erythema. No pallor. No clubbing, cyanosis, or edema.  Psychiatric: Normal mood and affect. Behavior is normal. Judgment and thought content normal.   LABORATORY RESULTS: Available labs are reviewed   10/18/20 CBC nearly normal (HCT 38.6) CMET normal other than glucose 142  RADIOLOGY RESULTS: See E-Chart or I-Site for most recent results. Images and reports are reviewed.  See above. Images and reports reviewed (pet and MRI)  ASSESSMENT AND PLAN: Gallbladder mass This certainly is concerning for gallbladder cancer. His gallbladder is full of gallstones as well. I think the overall risk-benefit ratio is in favor of doing a cholecystectomy. I would not offer him a liver resection given his age and overall health, but think he could  tolerate a laparoscopic cholecystectomy.  I discussed that its possible that I could find evidence of carcinomatosis and that if that is the case I would abort surgery. However, if I only see thickening at the gallbladder fundus, I would plan to proceed with cholecystectomy.  I discussed the risks of surgery with the patient and his wife. I reviewed risks of bleeding, infection, bile leak, possible need for additional surgeries or procedures, possible hospitalization, possible delay of additional chemotherapy, heart or lung complications, blood clot, death.  He is leaning toward proceeding but does want to think about this for a day or 2. I will communicate with Dr. Irene Limbo appropriate timing of surgery from the standpoint of his maintenance chemotherapy.  Milus Height, MD FACS Surgical Oncology, General Surgery, Trauma and [facility], [facility]

## 2020-12-14 NOTE — Anesthesia Postprocedure Evaluation (Signed)
Anesthesia Post Note  Patient: Mario Garcia  Procedure(s) Performed: LAPAROSCOPIC CHOLECYSTECTOMY (Abdomen)     Patient location during evaluation: PACU Anesthesia Type: General Level of consciousness: awake and alert and oriented Pain management: pain level controlled Vital Signs Assessment: post-procedure vital signs reviewed and stable Respiratory status: spontaneous breathing, nonlabored ventilation and respiratory function stable Cardiovascular status: blood pressure returned to baseline and stable Postop Assessment: no apparent nausea or vomiting Anesthetic complications: no   No notable events documented.  Last Vitals:  Vitals:   12/14/20 1038 12/14/20 1053  BP: (!) 151/67 (!) 145/65  Pulse: (!) 57 (!) 57  Resp: 14 12  Temp: (!) 36.2 C   SpO2: 100% 97%    Last Pain:  Vitals:   12/14/20 0712  TempSrc:   PainSc: 0-No pain                 Avianna Moynahan A.

## 2020-12-14 NOTE — Interval H&P Note (Signed)
History and Physical Interval Note:  12/14/2020 8:50 AM  Mario Garcia  has presented today for surgery, with the diagnosis of GALLBLADDER MASS, GALLSTONES.  The various methods of treatment have been discussed with the patient and family. After consideration of risks, benefits and other options for treatment, the patient has consented to  Procedure(s): LAPAROSCOPIC CHOLECYSTECTOMY (N/A) as a surgical intervention.  The patient's history has been reviewed, patient examined, no change in status, stable for surgery.  I have reviewed the patient's chart and labs.  Questions were answered to the patient's satisfaction.     Stark Klein

## 2020-12-14 NOTE — Anesthesia Preprocedure Evaluation (Signed)
Anesthesia Evaluation  Patient identified by MRN, date of birth, ID band Patient awake    Reviewed: Allergy & Precautions, NPO status , Patient's Chart, lab work & pertinent test results, reviewed documented beta blocker date and time   Airway Mallampati: II  TM Distance: >3 FB Neck ROM: Full    Dental no notable dental hx. (+) Partial Lower, Caps, Dental Advisory Given   Pulmonary pneumonia, resolved, former smoker,    Pulmonary exam normal breath sounds clear to auscultation       Cardiovascular hypertension, Pt. on medications Normal cardiovascular exam Rhythm:Regular Rate:Normal     Neuro/Psych Glaucoma ou negative psych ROS   GI/Hepatic Neg liver ROS, Cholelithiasis Possible mass GB   Endo/Other  Hyperlipidemia  Renal/GU negative Renal ROS   Hx/o prostate Ca S/P prostatectomy negative genitourinary   Musculoskeletal  (+) Arthritis , Osteoarthritis,    Abdominal   Peds  Hematology Hx/o B Cell lymphoma currently with Mantle cell lymphoma undergoing chemoRx   Anesthesia Other Findings   Reproductive/Obstetrics                             Anesthesia Physical Anesthesia Plan  ASA: 3  Anesthesia Plan: General   Post-op Pain Management:    Induction:   PONV Risk Score and Plan: 4 or greater and Treatment may vary due to age or medical condition and Ondansetron  Airway Management Planned: Oral ETT  Additional Equipment:   Intra-op Plan:   Post-operative Plan: Extubation in OR  Informed Consent: I have reviewed the patients History and Physical, chart, labs and discussed the procedure including the risks, benefits and alternatives for the proposed anesthesia with the patient or authorized representative who has indicated his/her understanding and acceptance.     Dental advisory given  Plan Discussed with: CRNA and Anesthesiologist  Anesthesia Plan Comments:          Anesthesia Quick Evaluation

## 2020-12-15 ENCOUNTER — Encounter (HOSPITAL_COMMUNITY): Payer: Self-pay | Admitting: General Surgery

## 2020-12-20 ENCOUNTER — Inpatient Hospital Stay: Payer: Medicare Other

## 2020-12-20 ENCOUNTER — Telehealth: Payer: Self-pay | Admitting: Hematology

## 2020-12-20 NOTE — Telephone Encounter (Signed)
Scheduled per sch msg. Called, not able to leave msg. Mailed printout  °

## 2020-12-21 LAB — SURGICAL PATHOLOGY

## 2020-12-22 ENCOUNTER — Telehealth: Payer: Self-pay | Admitting: General Surgery

## 2020-12-22 DIAGNOSIS — C23 Malignant neoplasm of gallbladder: Secondary | ICD-10-CM | POA: Insufficient documentation

## 2020-12-22 NOTE — Telephone Encounter (Signed)
Discussed pathology with patient.  Reviewed that it was cancer but we expected that from imaging and operative findings.  Will communicate with Dr. Irene Limbo.  He is tolerating soreness at this point with tylenol and occasional ibuprofen.  He is also very tired and having to sleep a lot.  He has a follow up appt.  He is walking.

## 2020-12-27 ENCOUNTER — Inpatient Hospital Stay: Payer: Medicare Other | Attending: Hematology

## 2020-12-27 ENCOUNTER — Other Ambulatory Visit: Payer: Self-pay

## 2020-12-27 ENCOUNTER — Other Ambulatory Visit: Payer: Self-pay | Admitting: Hematology

## 2020-12-27 ENCOUNTER — Inpatient Hospital Stay (HOSPITAL_BASED_OUTPATIENT_CLINIC_OR_DEPARTMENT_OTHER): Payer: Medicare Other | Admitting: Hematology

## 2020-12-27 ENCOUNTER — Inpatient Hospital Stay: Payer: Medicare Other

## 2020-12-27 ENCOUNTER — Inpatient Hospital Stay: Payer: Medicare Other | Admitting: Hematology and Oncology

## 2020-12-27 VITALS — BP 110/77 | HR 60 | Temp 98.0°F | Resp 17 | Wt 150.8 lb

## 2020-12-27 DIAGNOSIS — Z79899 Other long term (current) drug therapy: Secondary | ICD-10-CM | POA: Diagnosis not present

## 2020-12-27 DIAGNOSIS — Z5112 Encounter for antineoplastic immunotherapy: Secondary | ICD-10-CM | POA: Insufficient documentation

## 2020-12-27 DIAGNOSIS — C8310 Mantle cell lymphoma, unspecified site: Secondary | ICD-10-CM | POA: Diagnosis not present

## 2020-12-27 DIAGNOSIS — C8331 Diffuse large B-cell lymphoma, lymph nodes of head, face, and neck: Secondary | ICD-10-CM

## 2020-12-27 DIAGNOSIS — R978 Other abnormal tumor markers: Secondary | ICD-10-CM | POA: Insufficient documentation

## 2020-12-27 DIAGNOSIS — C23 Malignant neoplasm of gallbladder: Secondary | ICD-10-CM

## 2020-12-27 LAB — CBC WITH DIFFERENTIAL/PLATELET
Abs Immature Granulocytes: 0.22 10*3/uL — ABNORMAL HIGH (ref 0.00–0.07)
Basophils Absolute: 0 10*3/uL (ref 0.0–0.1)
Basophils Relative: 0 %
Eosinophils Absolute: 0.1 10*3/uL (ref 0.0–0.5)
Eosinophils Relative: 2 %
HCT: 35.6 % — ABNORMAL LOW (ref 39.0–52.0)
Hemoglobin: 11.7 g/dL — ABNORMAL LOW (ref 13.0–17.0)
Immature Granulocytes: 3 %
Lymphocytes Relative: 9 %
Lymphs Abs: 0.7 10*3/uL (ref 0.7–4.0)
MCH: 29.3 pg (ref 26.0–34.0)
MCHC: 32.9 g/dL (ref 30.0–36.0)
MCV: 89.2 fL (ref 80.0–100.0)
Monocytes Absolute: 0.4 10*3/uL (ref 0.1–1.0)
Monocytes Relative: 5 %
Neutro Abs: 6.5 10*3/uL (ref 1.7–7.7)
Neutrophils Relative %: 81 %
Platelets: 304 10*3/uL (ref 150–400)
RBC: 3.99 MIL/uL — ABNORMAL LOW (ref 4.22–5.81)
RDW: 13 % (ref 11.5–15.5)
WBC: 7.9 10*3/uL (ref 4.0–10.5)
nRBC: 0 % (ref 0.0–0.2)

## 2020-12-27 LAB — CMP (CANCER CENTER ONLY)
ALT: 38 U/L (ref 0–44)
AST: 33 U/L (ref 15–41)
Albumin: 2.9 g/dL — ABNORMAL LOW (ref 3.5–5.0)
Alkaline Phosphatase: 285 U/L — ABNORMAL HIGH (ref 38–126)
Anion gap: 10 (ref 5–15)
BUN: 14 mg/dL (ref 8–23)
CO2: 26 mmol/L (ref 22–32)
Calcium: 8.9 mg/dL (ref 8.9–10.3)
Chloride: 103 mmol/L (ref 98–111)
Creatinine: 0.77 mg/dL (ref 0.61–1.24)
GFR, Estimated: >60 mL/min (ref >60)
Glucose, Bld: 118 mg/dL — ABNORMAL HIGH (ref 70–99)
Potassium: 3.9 mmol/L (ref 3.5–5.1)
Sodium: 139 mmol/L (ref 135–145)
Total Bilirubin: 0.5 mg/dL (ref 0.3–1.2)
Total Protein: 6.8 g/dL (ref 6.5–8.1)

## 2020-12-27 LAB — LACTATE DEHYDROGENASE: LDH: 251 U/L — ABNORMAL HIGH (ref 98–192)

## 2020-12-27 MED ORDER — DIPHENHYDRAMINE HCL 25 MG PO CAPS
50.0000 mg | ORAL_CAPSULE | Freq: Once | ORAL | Status: AC
  Administered 2020-12-27: 50 mg via ORAL
  Filled 2020-12-27: qty 2

## 2020-12-27 MED ORDER — METHYLPREDNISOLONE SODIUM SUCC 125 MG IJ SOLR
80.0000 mg | Freq: Once | INTRAMUSCULAR | Status: AC
  Administered 2020-12-27: 80 mg via INTRAVENOUS
  Filled 2020-12-27: qty 2

## 2020-12-27 MED ORDER — SODIUM CHLORIDE 0.9% FLUSH: 10.0000 mL | INTRAVENOUS | Status: DC | PRN

## 2020-12-27 MED ORDER — ACETAMINOPHEN 325 MG PO TABS
650.0000 mg | ORAL_TABLET | Freq: Once | ORAL | Status: AC
  Administered 2020-12-27: 650 mg via ORAL
  Filled 2020-12-27: qty 2

## 2020-12-27 MED ORDER — HEPARIN SOD (PORK) LOCK FLUSH 100 UNIT/ML IV SOLN: 500.0000 [IU] | Freq: Once | INTRAVENOUS | Status: DC | PRN

## 2020-12-27 MED ORDER — RITUXIMAB-PVVR CHEMO 500 MG/50ML IV SOLN
375.0000 mg/m2 | Freq: Once | INTRAVENOUS | Status: AC
  Administered 2020-12-27: 700 mg via INTRAVENOUS
  Filled 2020-12-27: qty 50

## 2020-12-27 MED ORDER — SODIUM CHLORIDE 0.9 % IV SOLN: Freq: Once | INTRAVENOUS | Status: AC

## 2020-12-27 MED ORDER — FAMOTIDINE 20 MG IN NS 100 ML IVPB
20.0000 mg | Freq: Once | INTRAVENOUS | Status: AC
  Administered 2020-12-27: 20 mg via INTRAVENOUS
  Filled 2020-12-27: qty 100

## 2020-12-28 ENCOUNTER — Telehealth: Payer: Self-pay | Admitting: Hematology

## 2020-12-28 LAB — CANCER ANTIGEN 19-9: CA 19-9: 82 U/mL — ABNORMAL HIGH (ref 0–35)

## 2020-12-28 NOTE — Telephone Encounter (Signed)
Scheduled follow-up appointments per 10/3 los. Patient is aware. 

## 2020-12-29 ENCOUNTER — Other Ambulatory Visit: Payer: Self-pay | Admitting: Family Medicine

## 2020-12-29 ENCOUNTER — Other Ambulatory Visit: Payer: Self-pay

## 2020-12-29 DIAGNOSIS — E785 Hyperlipidemia, unspecified: Secondary | ICD-10-CM

## 2020-12-29 NOTE — Progress Notes (Signed)
The proposed treatment discussed in conference is for discussion purpose only and is not a binding recommendation.  The patients have not been physically examined, or presented with their treatment options.  Therefore, final treatment plans cannot be decided.  

## 2020-12-30 ENCOUNTER — Other Ambulatory Visit: Payer: Medicare Other

## 2021-01-02 NOTE — Progress Notes (Signed)
HEMATOLOGY/ONCOLOGY CLINIC NOTE  Date of Service:   Patient Care Team: Denita Lung, MD as PCP - General (Family Medicine) Brunetta Genera, MD as Consulting Physician (Hematology)  CHIEF COMPLAINTS/PURPOSE OF CONSULTATION:  History of diffuse Large B-Cell Lymphoma Follow-up for continued management of mantle cell lymphoma  HISTORY OF PRESENTING ILLNESS:   Mario Garcia is a wonderful 87 y.o. male who has been referred to [place] by Dr. Jill Alexanders for evaluation and management of Diffuse Large B-Cell Lymphoma. The pt reports that he is doing well overall.   The pt reports that he feels that he has been a little more tired recently. He first noticed some "nodules" on his neck appear "4-6 weeks ago." He notes that these nodules haven't been painful. He appears to have presented to care with his PCP on 05/28/18, Dr. Redmond School. He notes that he has noticed that he has been sweating more in the last year. He denies noticing any other new lumps or bumps. He denies any fevers, chills, drenching night sweats, or unexpected weight loss. He notes that he has had some changes in swallowing over the last 6 months, which he characterizes as "getting strangled on his saliva every now and then." He denies abdominal pains, acid reflux, changes in bowel habits, leg swelling, skin rashes. He endorses a deep pain "every once in a while," in his left groin. He denies headaches. He endorses glaucoma and a history of cataracts. He sees Dr. Hetty Blend in ophthalmology. He denies any other concern in the last 6 months.  The pt notes that he lives independently with his wife and still is able to do all he wants to do. He walks on trails with his wife and notes that he can generally walk as far as he likes to.  The pt notes that he had prostate cancer in 2008 or 2009, s/p prostatectomy. He did not require RT nor systemic treatments. He denies lung, heart or kidney problems.  Of note prior to the patient's  visit today, pt has had a CT Neck completed on 06/05/18 with results revealing Pathologic RIGHT-sided level II, III, IV, and V lymphadenopathy, most consistent with metastatic carcinoma. An obvious primary source is not identified. Tissue sampling is warranted.  Most recent lab results (07/08/18) of CBC and CMP is as follows: all values are WNL except for PLT at 129k.  On review of systems, pt reports slightly more tired, neck nodules, some changes in swallowing, staying active, and denies fevers, chills, drenching night sweats, unexpected weight loss, abdominal pains, changes in bowel habits, skin rashes, leg swelling, CP, SOB, difficulty breathing, headaches, other lumps or bumps, skin lesions, mouth sores, pain along the spine, back pain, leg swelling, and any other symptoms.   On PMHx the pt reports localized Prostate cancer in 2008 s/p prostatectomy.  On Social Hx the pt reports that he quit smoking over 50 years ago. He notes that he consumes about 1 glass of wine every day, without concerns for excessive drinking. 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 He denies concern for chemical or radiation exposure. On Family Hx the pt reports wife with Stage IV Non Hodgkin's Lymphoma, paternal grandmother with leukemia in her 71s, father with unspecified abdominal cancer.   INTERVAL HISTORY:   Mario Garcia returns today for management and evaluation of his mantle cell lymphoma.  The patient's last visit with [place] was on 10/18/2020. T  Reports he is doing  well and healing well after his gallbladder surgery. Pathology showed presence of gallbladder adenocarcinoma.  Lymph node was negative for adenocarcinoma.  No fevers no chills no night sweats no new lumps or bumps noted. Good bowel movements.  No urinary symptoms..  Labs reviewed with the patient today. On review of systems, pt reports no abdominal pain no fevers/chills/night sweats, nausea or vomiting.   MEDICAL  HISTORY:  Past Medical History:  Diagnosis Date   Arthritis    Cancer ([place])    PROSTATE   History of colonic polyps    History of kidney stones    Hypertension    Inguinal hernia 03/2002   Pneumonia    "years ago"    SURGICAL HISTORY: Past Surgical History:  Procedure Laterality Date   CATARACT EXTRACTION Bilateral    CHOLECYSTECTOMY N/A 12/14/2020   Procedure: LAPAROSCOPIC CHOLECYSTECTOMY;  Surgeon: Stark Klein, MD;  Location: [facility];  Service: General;  Laterality: N/A;   COLONOSCOPY     DIRECT LARYNGOSCOPY N/A 07/08/2018   Procedure: DIRECT LARYNGOSCOPY;  Surgeon: Leta Baptist, MD;  Location: [facility];  Service: ENT;  Laterality: N/A;   ESOPHAGOSCOPY N/A 07/08/2018   Procedure: ESOPHAGOSCOPY;  Surgeon: Leta Baptist, MD;  Location: [facility];  Service: ENT;  Laterality: N/A;   EYE SURGERY     FLEXIBLE BRONCHOSCOPY N/A 07/08/2018   Procedure: FLEXIBLE BRONCHOSCOPY;  Surgeon: Leta Baptist, MD;  Location: [facility];  Service: ENT;  Laterality: N/A;   HERNIA REPAIR Left    inguinal hernia   INGUINAL HERNIA REPAIR Right 09/03/2020   Procedure: OPEN RIGHT INGUINAL HERNIA REPAIR;  Surgeon: Clovis Riley, MD;  Location: [facility];  Service: General;  Laterality: Right;   IR IMAGING GUIDED PORT INSERTION  07/30/2018   MASS BIOPSY Right 07/08/2018   Procedure: RIGHT NECK MASS EXCISIONAL BIOPSY;  Surgeon: Leta Baptist, MD;  Location: [facility];  Service: ENT;  Laterality: Right;   PATELLA FRACTURE SURGERY Left    PROSTATE SURGERY  10/2005   RADIAL PROSTATECTOMY   ROTATOR CUFF REPAIR Bilateral     SOCIAL HISTORY: Social History   Socioeconomic History   Marital status: Married    Spouse name: Not on file   Number of children: Not on file   Years of education: Not on file   Highest education level: Not on file  Occupational History   Not on file  Tobacco Use   Smoking status: Former   Smokeless tobacco: Never   Tobacco comments:    stopped at the age of  89  Vaping Use   Vaping Use: Never used   Substance and Sexual Activity   Alcohol use: Not Currently    Alcohol/week: 6.0 standard drinks    Types: 6 Standard drinks or equivalent per week   Drug use: No   Sexual activity: Not Currently  Other Topics Concern   Not on file  Social History Narrative   Not on file   Social Determinants of Health   Financial Resource Strain: Not on file  Food Insecurity: Not on file  Transportation Needs: Not on file  Physical Activity: Not on file  Stress: Not on file  Social Connections: Not on file  Intimate Partner Violence: Not on file    FAMILY HISTORY: Family History  Problem Relation Age of Onset   Cancer Father    Cancer Brother     ALLERGIES:  is allergic to lisinopril and penicillins.  MEDICATIONS:  Current Outpatient Medications  Medication Sig Dispense Refill  latanoprost (XALATAN) 0.005 % ophthalmic solution Place 1 drop into both eyes at bedtime.     losartan-hydrochlorothiazide (HYZAAR) 50-12.5 MG tablet TAKE 1 TABLET BY MOUTH EVERY DAY 90 tablet 1   Multiple Vitamins-Minerals (ICAPS AREDS 2 PO) Take 1 tablet by mouth daily.     oxyCODONE (OXY IR/ROXICODONE) 5 MG immediate release tablet Take 0.5-1 tablets (2.5-5 mg total) by mouth every 6 (six) hours as needed for severe pain. 8 tablet 0   pravastatin (PRAVACHOL) 40 MG tablet Take 1 tablet by mouth once daily 90 tablet 0   traMADol (ULTRAM) 50 MG tablet Take 1 tablet (50 mg total) by mouth every 6 (six) hours as needed. (Patient taking differently: Take 50 mg by mouth every 6 (six) hours as needed for moderate pain.) 15 tablet 0   No current facility-administered medications for this visit.    REVIEW OF SYSTEMS:   .10 Point review of Systems was done is negative except as noted above.   PHYSICAL EXAMINATION: ECOG FS:1 - Symptomatic but completely ambulatory  There were no vitals filed for this visit.   Wt Readings from Last 3 Encounters:  12/27/20 150 lb 12 oz (68.4 kg)  12/14/20 168 lb (76.2 kg)   12/02/20 157 lb 11.2 oz (71.5 kg)   There is no height or weight on file to calculate BMI.   Marland Kitchen GENERAL:alert, in no acute distress and comfortable SKIN: no acute rashes, no significant lesions EYES: conjunctiva are pink and non-injected, sclera anicteric OROPHARYNX: MMM, no exudates, no oropharyngeal erythema or ulceration NECK: supple, no JVD LYMPH:  no palpable lymphadenopathy in the cervical, axillary or inguinal regions LUNGS: clear to auscultation b/l with normal respiratory effort HEART: regular rate & rhythm ABDOMEN:  normoactive bowel sounds , non tender, not distended. Extremity: no pedal edema PSYCH: alert & oriented x 3 with fluent speech NEURO: no focal motor/sensory deficits   LABORATORY DATA:  I have reviewed the data as listed  . CBC Latest Ref Rng & Units 12/27/2020 12/02/2020 10/18/2020  WBC 4.0 - 10.5 K/uL 7.9 5.1 6.0  Hemoglobin 13.0 - 17.0 g/dL 11.7(L) 13.3 13.2  Hematocrit 39.0 - 52.0 % 35.6(L) 39.9 38.6(L)  Platelets 150 - 400 K/uL 304 141(L) 142(L)    . CMP Latest Ref Rng & Units 12/27/2020 12/02/2020 10/18/2020  Glucose 70 - 99 mg/dL 118(H) 101(H) 142(H)  BUN 8 - 23 mg/dL _0 Creatinine 0.61 - 1.24 mg/dL 0.77 0.89 0.91  Sodium 135 - 145 mmol/L 139 138 141  Potassium 3.5 - 5.1 mmol/L 3.9 3.5 3.6  Chloride 98 - 111 mmol/L 103 99 104  CO2 22 - 32 mmol/L _1 Calcium 8.9 - 10.3 mg/dL 8.9 9.0 9.4  Total Protein 6.5 - 8.1 g/dL 6.8 - 7.0  Total Bilirubin 0.3 - 1.2 mg/dL 0.5 - 0.6  Alkaline Phos 38 - 126 U/L 285(H) - 72  AST 15 - 41 U/L 33 - 19  ALT 0 - 44 U/L 38 - 12   . Lab Results  Component Value Date   LDH 251 (H) 12/27/2020   02/23/2020 Right Cervical Lymph Node Surgical Pathology [phone]):    02/23/2020 FISH Analysis:   02/23/2020 FISH Mutation Analysis:    03/03/2019 NM PET Image Restag (PS) Skull Base To Thigh (Accession [id number]):   07/08/18 Right Cervical Tissue Biopsy:    RADIOGRAPHIC STUDIES: I have  personally reviewed the radiological images as listed and agreed with the findings in the report. No results found.  ASSESSMENT & PLAN:   87 y.o. male with  1. H/o Diffuse Large B-Cell Lymphoma, Stage IV- now in remission Presenting without constitutional symptoms. Palpable right cervical and supraclavicular lymphadenopathy.  07/08/18 Right cervical soft tissue biopsy revealed Diffuse Large B-Cell Lymphoma, germinal center type  06/05/18 CT Neck revealed Pathologic RIGHT-sided level II, III, IV, and V lymphadenopathy, most consistent with metastatic carcinoma. An obvious primary source is not identified. Tissue sampling is warranted. 07/16/18 Hep B and Hep C negative 07/17/18 ECHO revealed LV EF of 60-65% 07/22/18 PET/CT revealed "Hypermetabolic adenopathy especially concentrated in the right neck but also in the left neck, chest, abdomen, and pelvis. The adenopathy is primarily Deauville 5 although some few scattered smaller lesions are Deauville 4. 2. Diffuse abnormal splenic activity, Deauville 5, without overt splenomegaly. 3. There is also hypermetabolic Deauville 5 activity in the mildly thickened distal appendix, and raising suspicion for involvement of the appendix. 4. Other imaging findings of potential clinical significance: Aortic Atherosclerosis. Coronary atherosclerosis." 10/03/2018 PET scan revealed "Interval response to therapy with stable to decreased size of lymph nodes on CT and generalized decrease in hypermetabolism of the abnormal nodes. Hypermetabolism in the lymph nodes today is compatible with a combination of Deauville 3 and Deauville 4 disease. Stable tiny bilateral pulmonary nodules. Increase radiotracer accumulation in the marrow space on today's study, presumably representing marrow stimulatory effects of therapy."  2. Currently with new Non-Hodgkin's B-cell Lymphoma - consistent with Mantle Cell 02/23/2020 Right Cervical Lymph Node Surgical Pathology [phone]) which  revealed "Non-Hodgkin B-cell lymphoma. Ki-67 generally shows low expression (<5-15%)." 02/23/2020 FISH Mutation Analysis which revealed "t(11;14): DETECTED".  3. Newly noted gall bladder lesion concern for gallbladder cancer PLAN -lab workup 12/27/2020 reviewed with the patient.  Stable -Pathology from his recent gallbladder surgery showed GALLBLADDER, CHOLECYSTECTOMY:  - Adenocarcinoma, 2.4 cm maximally, with perforation of serosa. Negative for carcinoma in one lymph node. Cholelithiasis.  -Patient is recovering well from surgery -continue Rituxan every 2 months with labs. -will hold off on addiing Ibrutinib for mantle cell lymphoma at this time.  We shall plan to start this in about 4 weeks if stable. -Recommended pt check his baseline BP first thing in the morning prior to any movement or coffee. If low, pt will need to contact PCP for medication adjustment.   FOLLOW UP: Please cancel lab and MD visit scheduled for 01/05/2021 Please schedule for port flush labs and MD visit in 4 weeks Please schedule next cycle of maintenance Rituxan in 2 months with port flush and labs and MD visit  . The total time spent in the appointment was 32 minutes and more than 50% was on counseling and direct patient cares.   All of the patient's questions were answered with apparent satisfaction. The patient knows to call the clinic with any problems, questions or concerns.   Sullivan Lone MD [place] AAHIVMS [facility] Hematology/Oncology Physician [facility]

## 2021-01-03 ENCOUNTER — Encounter: Payer: Self-pay | Admitting: Hematology

## 2021-01-04 ENCOUNTER — Other Ambulatory Visit (INDEPENDENT_AMBULATORY_CARE_PROVIDER_SITE_OTHER): Payer: Medicare Other

## 2021-01-04 ENCOUNTER — Other Ambulatory Visit: Payer: Self-pay

## 2021-01-04 DIAGNOSIS — Z23 Encounter for immunization: Secondary | ICD-10-CM | POA: Diagnosis not present

## 2021-01-05 ENCOUNTER — Ambulatory Visit: Payer: Medicare Other | Admitting: Hematology

## 2021-01-14 ENCOUNTER — Telehealth: Payer: Medicare Other | Admitting: Physician Assistant

## 2021-01-14 DIAGNOSIS — J028 Acute pharyngitis due to other specified organisms: Secondary | ICD-10-CM

## 2021-01-14 DIAGNOSIS — B9689 Other specified bacterial agents as the cause of diseases classified elsewhere: Secondary | ICD-10-CM | POA: Diagnosis not present

## 2021-01-14 MED ORDER — AZITHROMYCIN 250 MG PO TABS: ORAL_TABLET | ORAL | 0 refills | Status: DC

## 2021-01-14 NOTE — Progress Notes (Signed)
E-Visit for Sore Throat - Strep Symptoms  We are sorry that you are not feeling well.  Here is how we plan to help!  Based on what you have shared with me it is likely that you have strep pharyngitis.  Strep pharyngitis is inflammation and infection in the back of the throat.  This is an infection cause by bacteria and is treated with antibiotics.  I have prescribed Azithromycin 250 mg two tablets today and then one daily for 4 additional days. For throat pain, we recommend over the counter oral pain relief medications such as acetaminophen or aspirin, or anti-inflammatory medications such as ibuprofen or naproxen sodium. Topical treatments such as oral throat lozenges or sprays may be used as needed. Strep infections are not as easily transmitted as other respiratory infections, however we still recommend that you avoid close contact with loved ones, especially the very young and elderly.  Remember to wash your hands thoroughly throughout the day as this is the number one way to prevent the spread of infection and wipe down door knobs and counters with disinfectant.   Home Care: Only take medications as instructed by your medical team. Complete the entire course of an antibiotic. Do not take these medications with alcohol. A steam or ultrasonic humidifier can help congestion.  You can place a towel over your head and breathe in the steam from hot water coming from a faucet. Avoid close contacts especially the very young and the elderly. Cover your mouth when you cough or sneeze. Always remember to wash your hands.  Get Help Right Away If: You develop worsening fever or sinus pain. You develop a severe head ache or visual changes. Your symptoms persist after you have completed your treatment plan.  Make sure you Understand these instructions. Will watch your condition. Will get help right away if you are not doing well or get worse.   Thank you for choosing an e-visit.  Your e-visit  answers were reviewed by a board certified advanced clinical practitioner to complete your personal care plan. Depending upon the condition, your plan could have included both over the counter or prescription medications.  Please review your pharmacy choice. Make sure the pharmacy is open so you can pick up prescription now. If there is a problem, you may contact your provider through MyChart messaging and have the prescription routed to another pharmacy.  Your safety is important to us. If you have drug allergies check your prescription carefully.   For the next 24 hours you can use MyChart to ask questions about today's visit, request a non-urgent call back, or ask for a work or school excuse. You will get an email in the next two days asking about your experience. I hope that your e-visit has been valuable and will speed your recovery.  I provided 5 minutes of non face-to-face time during this encounter for chart review and documentation.   

## 2021-01-21 ENCOUNTER — Other Ambulatory Visit: Payer: Self-pay

## 2021-01-21 DIAGNOSIS — C8331 Diffuse large B-cell lymphoma, lymph nodes of head, face, and neck: Secondary | ICD-10-CM

## 2021-01-24 ENCOUNTER — Inpatient Hospital Stay: Payer: Medicare Other

## 2021-01-24 ENCOUNTER — Inpatient Hospital Stay (HOSPITAL_BASED_OUTPATIENT_CLINIC_OR_DEPARTMENT_OTHER): Payer: Medicare Other | Admitting: Hematology

## 2021-01-24 ENCOUNTER — Other Ambulatory Visit: Payer: Self-pay

## 2021-01-24 VITALS — BP 139/61 | HR 72 | Temp 97.3°F | Resp 18 | Wt 148.8 lb

## 2021-01-24 DIAGNOSIS — Z5112 Encounter for antineoplastic immunotherapy: Secondary | ICD-10-CM | POA: Diagnosis not present

## 2021-01-24 DIAGNOSIS — C8318 Mantle cell lymphoma, lymph nodes of multiple sites: Secondary | ICD-10-CM | POA: Diagnosis not present

## 2021-01-24 DIAGNOSIS — R978 Other abnormal tumor markers: Secondary | ICD-10-CM | POA: Diagnosis not present

## 2021-01-24 DIAGNOSIS — C23 Malignant neoplasm of gallbladder: Secondary | ICD-10-CM | POA: Diagnosis not present

## 2021-01-24 DIAGNOSIS — Z79899 Other long term (current) drug therapy: Secondary | ICD-10-CM | POA: Diagnosis not present

## 2021-01-24 DIAGNOSIS — Z95828 Presence of other vascular implants and grafts: Secondary | ICD-10-CM

## 2021-01-24 DIAGNOSIS — C8331 Diffuse large B-cell lymphoma, lymph nodes of head, face, and neck: Secondary | ICD-10-CM

## 2021-01-24 DIAGNOSIS — C8310 Mantle cell lymphoma, unspecified site: Secondary | ICD-10-CM | POA: Diagnosis not present

## 2021-01-24 LAB — CBC WITH DIFFERENTIAL (CANCER CENTER ONLY)
Abs Immature Granulocytes: 0.01 10*3/uL (ref 0.00–0.07)
Basophils Absolute: 0 10*3/uL (ref 0.0–0.1)
Basophils Relative: 0 %
Eosinophils Absolute: 0.1 10*3/uL (ref 0.0–0.5)
Eosinophils Relative: 2 %
HCT: 34.4 % — ABNORMAL LOW (ref 39.0–52.0)
Hemoglobin: 11.5 g/dL — ABNORMAL LOW (ref 13.0–17.0)
Immature Granulocytes: 0 %
Lymphocytes Relative: 45 %
Lymphs Abs: 1.5 10*3/uL (ref 0.7–4.0)
MCH: 29 pg (ref 26.0–34.0)
MCHC: 33.4 g/dL (ref 30.0–36.0)
MCV: 86.9 fL (ref 80.0–100.0)
Monocytes Absolute: 0.4 10*3/uL (ref 0.1–1.0)
Monocytes Relative: 12 %
Neutro Abs: 1.3 10*3/uL — ABNORMAL LOW (ref 1.7–7.7)
Neutrophils Relative %: 41 %
Platelet Count: 148 10*3/uL — ABNORMAL LOW (ref 150–400)
RBC: 3.96 MIL/uL — ABNORMAL LOW (ref 4.22–5.81)
RDW: 13.2 % (ref 11.5–15.5)
WBC Count: 3.3 10*3/uL — ABNORMAL LOW (ref 4.0–10.5)
nRBC: 0 % (ref 0.0–0.2)

## 2021-01-24 LAB — CMP (CANCER CENTER ONLY)
ALT: 17 U/L (ref 0–44)
AST: 22 U/L (ref 15–41)
Albumin: 3.6 g/dL (ref 3.5–5.0)
Alkaline Phosphatase: 115 U/L (ref 38–126)
Anion gap: 8 (ref 5–15)
BUN: 12 mg/dL (ref 8–23)
CO2: 29 mmol/L (ref 22–32)
Calcium: 8.7 mg/dL — ABNORMAL LOW (ref 8.9–10.3)
Chloride: 103 mmol/L (ref 98–111)
Creatinine: 0.76 mg/dL (ref 0.61–1.24)
GFR, Estimated: >60 mL/min (ref >60)
Glucose, Bld: 108 mg/dL — ABNORMAL HIGH (ref 70–99)
Potassium: 3.4 mmol/L — ABNORMAL LOW (ref 3.5–5.1)
Sodium: 140 mmol/L (ref 135–145)
Total Bilirubin: 0.5 mg/dL (ref 0.3–1.2)
Total Protein: 6.8 g/dL (ref 6.5–8.1)

## 2021-01-24 LAB — LACTATE DEHYDROGENASE: LDH: 169 U/L (ref 98–192)

## 2021-01-24 MED ORDER — HEPARIN SOD (PORK) LOCK FLUSH 100 UNIT/ML IV SOLN
500.0000 [IU] | Freq: Once | INTRAVENOUS | Status: AC | PRN
  Administered 2021-01-24: 500 [IU]

## 2021-01-24 MED ORDER — SODIUM CHLORIDE 0.9% FLUSH
10.0000 mL | INTRAVENOUS | Status: DC | PRN
  Administered 2021-01-24: 10 mL

## 2021-01-30 ENCOUNTER — Encounter: Payer: Self-pay | Admitting: Hematology

## 2021-01-31 ENCOUNTER — Other Ambulatory Visit (HOSPITAL_COMMUNITY): Payer: Self-pay

## 2021-01-31 ENCOUNTER — Telehealth: Payer: Self-pay | Admitting: Pharmacist

## 2021-01-31 MED ORDER — IBRUTINIB 280 MG PO TABS
280.0000 mg | ORAL_TABLET | Freq: Every day | ORAL | 1 refills | Status: DC
  Filled 2021-01-31: qty 56, fill #0

## 2021-01-31 NOTE — Telephone Encounter (Signed)
Oral Oncology Pharmacist Encounter  Received new prescription for Imbruvica (ibrutinib) for the treatment of mantle cell lymphoma in conjunction with rituximab, planned duration until disease progression or unacceptable drug toxicity.  CBC w/ Diff and CMP from 01/24/21 assessed, no relevant lab abnormalities noted. Patient starting on dose reduction on ibrutinib 280 mg by mouth once daily per MD and will be dose escalated as tolerated to goal dose of ibrutinib 560 mg by mouth once daily.  Current medication list in Epic reviewed, no relevant/significant DDIs with Imbruvica identified.  Evaluated chart and no patient barriers to medication adherence noted.   Patient agreement for treatment documented in MD note on 01/24/21.  Prescription has been e-scribed to the [facility] for benefits analysis and approval.  Oral Oncology Clinic will continue to follow for insurance authorization, copayment issues, initial counseling and start date.  Leron Croak, PharmD, BCPS Hematology/Oncology Clinical Pharmacist [facility] [place] [phone] 01/31/2021 8:34 AM

## 2021-01-31 NOTE — Progress Notes (Signed)
HEMATOLOGY/ONCOLOGY CLINIC NOTE  Date of Service: .01/24/2021   Patient Care Team: Denita Lung, MD as PCP - General (Family Medicine) Brunetta Genera, MD as Consulting Physician (Hematology)  CHIEF COMPLAINTS/PURPOSE OF CONSULTATION:  History of diffuse Large B-Cell Lymphoma Follow-up for continued management of mantle cell lymphoma Follow-up for gallbladder adenocarcinoma HISTORY OF PRESENTING ILLNESS:   Mario Garcia is a wonderful 87 y.o. male who has been referred to [place] by Dr. Jill Alexanders for evaluation and management of Diffuse Large B-Cell Lymphoma. The pt reports that he is doing well overall.   The pt reports that he feels that he has been a little more tired recently. He first noticed some "nodules" on his neck appear "4-6 weeks ago." He notes that these nodules haven't been painful. He appears to have presented to care with his PCP on 05/28/18, Dr. Redmond School. He notes that he has noticed that he has been sweating more in the last year. He denies noticing any other new lumps or bumps. He denies any fevers, chills, drenching night sweats, or unexpected weight loss. He notes that he has had some changes in swallowing over the last 6 months, which he characterizes as "getting strangled on his saliva every now and then." He denies abdominal pains, acid reflux, changes in bowel habits, leg swelling, skin rashes. He endorses a deep pain "every once in a while," in his left groin. He denies headaches. He endorses glaucoma and a history of cataracts. He sees Dr. Hetty Blend in ophthalmology. He denies any other concern in the last 6 months.  The pt notes that he lives independently with his wife and still is able to do all he wants to do. He walks on trails with his wife and notes that he can generally walk as far as he likes to.  The pt notes that he had prostate cancer in 2008 or 2009, s/p prostatectomy. He did not require RT nor systemic treatments. He denies lung, heart or  kidney problems.  Of note prior to the patient's visit today, pt has had a CT Neck completed on 06/05/18 with results revealing Pathologic RIGHT-sided level II, III, IV, and V lymphadenopathy, most consistent with metastatic carcinoma. An obvious primary source is not identified. Tissue sampling is warranted.  Most recent lab results (07/08/18) of CBC and CMP is as follows: all values are WNL except for PLT at 129k.  On review of systems, pt reports slightly more tired, neck nodules, some changes in swallowing, staying active, and denies fevers, chills, drenching night sweats, unexpected weight loss, abdominal pains, changes in bowel habits, skin rashes, leg swelling, CP, SOB, difficulty breathing, headaches, other lumps or bumps, skin lesions, mouth sores, pain along the spine, back pain, leg swelling, and any other symptoms.   On PMHx the pt reports localized Prostate cancer in 2008 s/p prostatectomy.  On Social Hx the pt reports that he quit smoking over 50 years ago. He notes that he consumes about 1 glass of wine every day, without concerns for excessive drinking. 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 He denies concern for chemical or radiation exposure. On Family Hx the pt reports wife with Stage IV Non Hodgkin's Lymphoma, paternal grandmother with leukemia in her 10s, father with unspecified abdominal cancer.   INTERVAL HISTORY:   [name] returns today for management and evaluation of his mantle cell lymphoma.  The patient's last visit with [place] was on 12/29/2020.  Patient reports he has healed up well from gallbladder surgery.  We had previously discussed his pathology showing gallbladder adenocarcinoma and discussed standard treatments if he were to have only this cancer.  In the context of his active mantle cell lymphoma and his decision to not pursue chemoradiation at this point we are focusing on his active mantle cell lymphoma monitoring his NED  status for gallbladder adenocarcinoma.  He notes no fevers no chills no night sweats no unexpected weight loss.  P.o. intake has improved since surgery. No other new infection issues. We discussed starting his ibrutinib in addition to his Rituxan for his mantle cell lymphoma.  After discussing the pros and cons he is agreeable to proceed with this.  Labs from 01/14/2021 CBC shows a hemoglobin 11.5 with a WBC count of 3.3k ANC of 1.3k and platelet count of 148k CMP unremarkable except a potassium of 3.4 LDH levels 169    MEDICAL HISTORY:  Past Medical History:  Diagnosis Date   Arthritis    Cancer ([place])    PROSTATE   History of colonic polyps    History of kidney stones    Hypertension    Inguinal hernia 03/2002   Pneumonia    "years ago"    SURGICAL HISTORY: Past Surgical History:  Procedure Laterality Date   CATARACT EXTRACTION Bilateral    CHOLECYSTECTOMY N/A 12/14/2020   Procedure: LAPAROSCOPIC CHOLECYSTECTOMY;  Surgeon: Stark Klein, MD;  Location: [facility];  Service: General;  Laterality: N/A;   COLONOSCOPY     DIRECT LARYNGOSCOPY N/A 07/08/2018   Procedure: DIRECT LARYNGOSCOPY;  Surgeon: Leta Baptist, MD;  Location: [facility];  Service: ENT;  Laterality: N/A;   ESOPHAGOSCOPY N/A 07/08/2018   Procedure: ESOPHAGOSCOPY;  Surgeon: Leta Baptist, MD;  Location: [facility];  Service: ENT;  Laterality: N/A;   EYE SURGERY     FLEXIBLE BRONCHOSCOPY N/A 07/08/2018   Procedure: FLEXIBLE BRONCHOSCOPY;  Surgeon: Leta Baptist, MD;  Location: [facility];  Service: ENT;  Laterality: N/A;   HERNIA REPAIR Left    inguinal hernia   INGUINAL HERNIA REPAIR Right 09/03/2020   Procedure: OPEN RIGHT INGUINAL HERNIA REPAIR;  Surgeon: Clovis Riley, MD;  Location: [facility];  Service: General;  Laterality: Right;   IR IMAGING GUIDED PORT INSERTION  07/30/2018   MASS BIOPSY Right 07/08/2018   Procedure: RIGHT NECK MASS EXCISIONAL BIOPSY;  Surgeon: Leta Baptist, MD;  Location: [facility];  Service: ENT;  Laterality: Right;    PATELLA FRACTURE SURGERY Left    PROSTATE SURGERY  10/2005   RADIAL PROSTATECTOMY   ROTATOR CUFF REPAIR Bilateral     SOCIAL HISTORY: Social History   Socioeconomic History   Marital status: Married    Spouse name: Not on file   Number of children: Not on file   Years of education: Not on file   Highest education level: Not on file  Occupational History   Not on file  Tobacco Use   Smoking status: Former   Smokeless tobacco: Never   Tobacco comments:    stopped at the age of  48  Vaping Use   Vaping Use: Never used  Substance and Sexual Activity   Alcohol use: Not Currently    Alcohol/week: 6.0 standard drinks    Types: 6 Standard drinks or equivalent per week   Drug use: No   Sexual activity: Not Currently  Other Topics Concern   Not on file  Social History Narrative   Not on file   Social Determinants  of Health   Financial Resource Strain: Not on file  Food Insecurity: Not on file  Transportation Needs: Not on file  Physical Activity: Not on file  Stress: Not on file  Social Connections: Not on file  Intimate Partner Violence: Not on file    FAMILY HISTORY: Family History  Problem Relation Age of Onset   Cancer Father    Cancer Brother     ALLERGIES:  is allergic to lisinopril and penicillins.  MEDICATIONS:  Current Outpatient Medications  Medication Sig Dispense Refill   latanoprost (XALATAN) 0.005 % ophthalmic solution Place 1 drop into both eyes at bedtime.     losartan-hydrochlorothiazide (HYZAAR) 50-12.5 MG tablet TAKE 1 TABLET BY MOUTH EVERY DAY 90 tablet 1   Multiple Vitamins-Minerals (ICAPS AREDS 2 PO) Take 1 tablet by mouth daily.     pravastatin (PRAVACHOL) 40 MG tablet Take 1 tablet by mouth once daily 90 tablet 0   traMADol (ULTRAM) 50 MG tablet Take 1 tablet (50 mg total) by mouth every 6 (six) hours as needed. (Patient taking differently: Take 50 mg by mouth every 6 (six) hours as needed for moderate pain.) 15 tablet 0   azithromycin  (ZITHROMAX) 250 MG tablet Take 2 tablets PO on day one, and one tablet PO daily thereafter until completed. (Patient not taking: Reported on 01/24/2021) 6 tablet 0   oxyCODONE (OXY IR/ROXICODONE) 5 MG immediate release tablet Take 0.5-1 tablets (2.5-5 mg total) by mouth every 6 (six) hours as needed for severe pain. (Patient not taking: Reported on 01/24/2021) 8 tablet 0   No current facility-administered medications for this visit.    REVIEW OF SYSTEMS:   .10 Point review of Systems was done is negative except as noted above.   PHYSICAL EXAMINATION: ECOG FS:1 - Symptomatic but completely ambulatory  Vitals:   01/24/21 1525  BP: 139/61  Pulse: 72  Resp: 18  Temp: (!) 97.3 F (36.3 C)  SpO2: 99%     Wt Readings from Last 3 Encounters:  01/24/21 148 lb 12.8 oz (67.5 kg)  12/27/20 150 lb 12 oz (68.4 kg)  12/14/20 168 lb (76.2 kg)   Body mass index is 21.35 kg/m.   Marland Kitchen GENERAL:alert, in no acute distress and comfortable SKIN: no acute rashes, no significant lesions EYES: conjunctiva are pink and non-injected, sclera anicteric OROPHARYNX: MMM, no exudates, no oropharyngeal erythema or ulceration NECK: supple, no JVD LYMPH:  no palpable lymphadenopathy in the cervical, axillary or inguinal regions LUNGS: clear to auscultation b/l with normal respiratory effort HEART: regular rate & rhythm ABDOMEN:  normoactive bowel sounds , non tender, not distended. Extremity: no pedal edema PSYCH: alert & oriented x 3 with fluent speech NEURO: no focal motor/sensory deficits    LABORATORY DATA:  I have reviewed the data as listed  . CBC Latest Ref Rng & Units 01/24/2021 12/27/2020 12/02/2020  WBC 4.0 - 10.5 K/uL 3.3(L) 7.9 5.1  Hemoglobin 13.0 - 17.0 g/dL 11.5(L) 11.7(L) 13.3  Hematocrit 39.0 - 52.0 % 34.4(L) 35.6(L) 39.9  Platelets 150 - 400 K/uL 148(L) 304 141(L)    . CMP Latest Ref Rng & Units 01/24/2021 12/27/2020 12/02/2020  Glucose 70 - 99 mg/dL 108(H) 118(H) 101(H)  BUN 8 - 23  mg/dL _0 Creatinine 0.61 - 1.24 mg/dL 0.76 0.77 0.89  Sodium 135 - 145 mmol/L 140 139 138  Potassium 3.5 - 5.1 mmol/L 3.4(L) 3.9 3.5  Chloride 98 - 111 mmol/L 103 103 99  CO2 22 - 32 mmol/L  _0 Calcium 8.9 - 10.3 mg/dL 8.7(L) 8.9 9.0  Total Protein 6.5 - 8.1 g/dL 6.8 6.8 -  Total Bilirubin 0.3 - 1.2 mg/dL 0.5 0.5 -  Alkaline Phos 38 - 126 U/L 115 285(H) -  AST 15 - 41 U/L 22 33 -  ALT 0 - 44 U/L 17 38 -   . Lab Results  Component Value Date   LDH 169 01/24/2021    02/23/2020 Right Cervical Lymph Node Surgical Pathology [id number]):    02/23/2020 FISH Analysis:   02/23/2020 FISH Mutation Analysis:    03/03/2019 NM PET Image Restag (PS) Skull Base To Thigh (Accession [id number]):   07/08/18 Right Cervical Tissue Biopsy:    RADIOGRAPHIC STUDIES: I have personally reviewed the radiological images as listed and agreed with the findings in the report. No results found.  ASSESSMENT & PLAN:   87 y.o. male with  1. H/o Diffuse Large B-Cell Lymphoma, Stage IV- now in remission Presenting without constitutional symptoms. Palpable right cervical and supraclavicular lymphadenopathy.  07/08/18 Right cervical soft tissue biopsy revealed Diffuse Large B-Cell Lymphoma, germinal center type  06/05/18 CT Neck revealed Pathologic RIGHT-sided level II, III, IV, and V lymphadenopathy, most consistent with metastatic carcinoma. An obvious primary source is not identified. Tissue sampling is warranted. 07/16/18 Hep B and Hep C negative 07/17/18 ECHO revealed LV EF of 60-65% 07/22/18 PET/CT revealed "Hypermetabolic adenopathy especially concentrated in the right neck but also in the left neck, chest, abdomen, and pelvis. The adenopathy is primarily Deauville 5 although some few scattered smaller lesions are Deauville 4. 2. Diffuse abnormal splenic activity, Deauville 5, without overt splenomegaly. 3. There is also hypermetabolic Deauville 5 activity in the mildly thickened distal  appendix, and raising suspicion for involvement of the appendix. 4. Other imaging findings of potential clinical significance: Aortic Atherosclerosis. Coronary atherosclerosis." 10/03/2018 PET scan revealed "Interval response to therapy with stable to decreased size of lymph nodes on CT and generalized decrease in hypermetabolism of the abnormal nodes. Hypermetabolism in the lymph nodes today is compatible with a combination of Deauville 3 and Deauville 4 disease. Stable tiny bilateral pulmonary nodules. Increase radiotracer accumulation in the marrow space on today's study, presumably representing marrow stimulatory effects of therapy."  2. Currently with new Non-Hodgkin's B-cell Lymphoma - consistent with Mantle Cell 02/23/2020 Right Cervical Lymph Node Surgical Pathology [phone]) which revealed "Non-Hodgkin B-cell lymphoma. Ki-67 generally shows low expression (<5-15%)." 02/23/2020 FISH Mutation Analysis which revealed "t(11;14): DETECTED".  3.  Recently diagnosed gallbladder adenocarcinoma status postcholecystectomy on 12/29/2020 -Pathology from his recent gallbladder surgery showed GALLBLADDER, CHOLECYSTECTOMY:  - Adenocarcinoma, 2.4 cm maximally, with perforation of serosa. Negative for carcinoma in one lymph node. Cholelithiasis.  CA 19-9 tumor marker somewhat elevated at 82 but this could be from biliary tract surgery.  We will continue to follow. PLAN -lab workup 01/24/2021 reviewed with the patient. Labs from 01/14/2021 CBC shows a hemoglobin 11.5 with a WBC count of 3.3k ANC of 1.3k and platelet count of 148k CMP unremarkable except a potassium of 3.4 LDH levels 169 -CA 19-9 levels 12/27/2020 were elevated to 82 we shall repeat these on follow-up to monitor it. -Patient has recovered well from his surgery and all his incisions have healed. -Discussed starting Ibrutinib 280 mg p.o. daily optimizing to 545m po daily for mantle cell lymphoma as tolerated -continue Rituxan every 2  months with labs. -Discussed potential adverse effects of Ibrutinib and things to monitor for  FOLLOW UP:  F/u as per scheduled upcoming  appointments    . The total time spent in the appointment was 30 minutes and more than 50% was on counseling and direct patient cares.    All of the patient's questions were answered with apparent satisfaction. The patient knows to call the clinic with any problems, questions or concerns.   Sullivan Lone MD [place] AAHIVMS [facility] Hematology/Oncology Physician [facility]

## 2021-01-31 NOTE — Addendum Note (Signed)
Addended by: Sullivan Lone on: 01/31/2021 08:18 AM   Modules accepted: Orders

## 2021-02-02 ENCOUNTER — Telehealth: Payer: Self-pay | Admitting: Pharmacist

## 2021-02-02 NOTE — Telephone Encounter (Addendum)
Oral Oncology Pharmacist Encounter  Met with patient while in clinic today to complete application for [facility] and [facility] ([employer]) in an effort to reduce the patient's out of pocket expense for Imbruvica to $0.  Application completed and faxed to: [phone]  [employer] phone number for follow up is: [phone]  This encounter will be updated until final determination.   Leron Croak, PharmD, BCPS Hematology/Oncology Clinical Pharmacist [facility] [phone] 02/02/2021 11:13 AM

## 2021-02-03 NOTE — Telephone Encounter (Signed)
Oral Oncology Pharmacist Encounter  Received notification from [place] and [place] Patient Assistance program that patient has been successfully enrolled into their program to receive Imbruvica from the manufacturer at $0 out of pocket until 02/02/2022.    Specialty Pharmacy that will dispense medication is [facility].  Patient knows to call the office with questions or concerns.   Oral Oncology Clinic will continue to follow.  Leron Croak, PharmD, BCPS Hematology/Oncology Clinical Pharmacist [facility] [phone] 02/03/2021 8:08 AM

## 2021-02-03 NOTE — Telephone Encounter (Signed)
Oral Chemotherapy Pharmacist Encounter  I spoke with patient for overview of: Imbruvica (ibrutinib) for the treatment of mantle cell lymphoma in conjunction with rituximab, planned duration until disease progression or unacceptable drug toxicity.  Counseled patient on administration, dosing, side effects, monitoring, drug-food interactions, safe handling, storage, and disposal.  Patient will take Imbruvica 280mg  tablets, 1 tablet (280mg ) by mouth once daily.  Patient will take Imbruvica at approximately the same time each day with a full glass of water and maintain adequate hydration throughout the day.  Patient knows to avoid grapefruit or grapefruit juice while on therapy with Imbruvica.  Imbruvica start date: tentatively 11/14-11/15/22 (pending delivery of medication from [facility])  Adverse effects include but are not limited to: bruising, decreased blood counts, N/V, diarrhea, musculoskeletal pain, arthralgias, peripheral edema, and hemorrhage.    Patient will obtain anti diarrheal and alert the office of 4 or more loose stools above baseline.  Reviewed with patient importance of keeping a medication schedule and plan for any missed doses. No barriers to medication adherence identified.  Medication reconciliation performed and medication/allergy list updated.  Due to high unaffordable copay, patient was approved to receive Imbruvica at no cost through [employer]. Medication is shipped through [employer]. Patient provided phone number to set up shipment [phone])  All questions answered.  Mr. Ala voiced understanding and appreciation.   Medication education handout placed in mail for patient. Patient knows to call the office with questions or concerns. Oral Chemotherapy Clinic phone number provided to patient.   Leron Croak, PharmD, BCPS Hematology/Oncology Clinical Pharmacist [facility] [place] [phone] 02/03/2021 2:57 PM

## 2021-02-04 ENCOUNTER — Other Ambulatory Visit: Payer: Self-pay

## 2021-02-04 DIAGNOSIS — C8331 Diffuse large B-cell lymphoma, lymph nodes of head, face, and neck: Secondary | ICD-10-CM

## 2021-02-04 MED ORDER — IBRUTINIB 280 MG PO TABS: 280.0000 mg | ORAL_TABLET | Freq: Every day | ORAL | 1 refills | Status: DC

## 2021-02-18 ENCOUNTER — Ambulatory Visit: Payer: Medicare Other

## 2021-02-21 ENCOUNTER — Ambulatory Visit (INDEPENDENT_AMBULATORY_CARE_PROVIDER_SITE_OTHER): Payer: Medicare Other | Admitting: Family Medicine

## 2021-02-21 ENCOUNTER — Encounter: Payer: Self-pay | Admitting: Family Medicine

## 2021-02-21 VITALS — BP 122/70 | HR 62 | Temp 98.2°F | Ht 68.0 in | Wt 151.8 lb

## 2021-02-21 DIAGNOSIS — I10 Essential (primary) hypertension: Secondary | ICD-10-CM

## 2021-02-21 DIAGNOSIS — E785 Hyperlipidemia, unspecified: Secondary | ICD-10-CM

## 2021-02-21 DIAGNOSIS — Z87442 Personal history of urinary calculi: Secondary | ICD-10-CM

## 2021-02-21 DIAGNOSIS — C8331 Diffuse large B-cell lymphoma, lymph nodes of head, face, and neck: Secondary | ICD-10-CM | POA: Diagnosis not present

## 2021-02-21 DIAGNOSIS — C23 Malignant neoplasm of gallbladder: Secondary | ICD-10-CM

## 2021-02-21 DIAGNOSIS — H9113 Presbycusis, bilateral: Secondary | ICD-10-CM | POA: Diagnosis not present

## 2021-02-21 DIAGNOSIS — Z8546 Personal history of malignant neoplasm of prostate: Secondary | ICD-10-CM

## 2021-02-21 DIAGNOSIS — I700 Atherosclerosis of aorta: Secondary | ICD-10-CM

## 2021-02-21 NOTE — Patient Instructions (Signed)
  Mario Garcia , Thank you for taking time to come for your Medicare Wellness Visit. I appreciate your ongoing commitment to your health goals. Please review the following plan we discussed and let me know if I can assist you in the future.   These are the goals we discussed: Continue on present medications.   This is a list of the screening recommended for you and due dates:  Health Maintenance  Topic Date Due   Tetanus Vaccine  09/29/2017   Pneumonia Vaccine  Completed   Flu Shot  Completed   COVID-19 Vaccine  Completed   Zoster (Shingles) Vaccine  Completed   HPV Vaccine  Aged Out

## 2021-02-21 NOTE — Progress Notes (Signed)
Mario Garcia is a 87 y.o. male who presents for annual wellness visit and follow-up on chronic medical conditions.  He has a previous history of prostate cancer and is presently being treated for mantle cell lymphoma.  He also was recently diagnosed with gallbladder cancer.  He seems to be handling all these fairly well.  He has no particular concerns about him other than the most recent medication he was placed on by his oncologist.  He continues on Pravachol as well as losartan/HCTZ.  He seems to be tolerating these fairly well.  He does have some memory issues and that he is doing some word searching but recognizes this.  He does wear hearing aids.  His immunizations were reviewed.  Review of the record does indicate evidence of atherosclerosis.  He also has a previous history of renal stones but no problems recently.  Overall he seems to doing quite nicely and has a very good attitude towards the multiple cancers that he has been dealing with.   Immunizations and Health Maintenance Immunization History  Administered Date(s) Administered   Fluad Quad(high Dose 65+) 12/05/2018, 12/26/2019, 01/04/2021   Influenza Split 12/21/2010, 11/29/2011   Influenza, High Dose Seasonal PF 12/22/2013, 12/03/2014, 12/21/2015, 12/18/2016, 12/13/2017   Influenza,inj,Quad PF,6+ Mos 12/23/2012   PFIZER(Purple Top)SARS-COV-2 Vaccination 05/11/2019, 06/03/2019, 12/11/2019   Pfizer Covid-19 Vaccine Bivalent Booster 43yrs & up 01/04/2021   Pneumococcal Conjugate-13 12/03/2014   Pneumococcal Polysaccharide-23 08/21/2002   Td 03/27/1998   Tdap 09/30/2007   Zoster Recombinat (Shingrix) 07/21/2017, 09/20/2017   Zoster, Live 09/30/2007   Health Maintenance Due  Topic Date Due   TETANUS/TDAP  09/29/2017    Last colonoscopy: aged out 09/21/2005 Dr. Benson Norway Last PSA: 11/16/17 <0.1 Dentist: Q six months  Ophtho: Q six months Dr. Herbert Deaner Exercise:N/A  Other doctors caring for patient include:Dr. Herbert Deaner   ophthalmology           Dr. Benson Norway GI           Dr. Irene Limbo oncology            Dr. Particia Nearing Dermatology  Advanced Directives: Does Patient Have a Medical Advance Directive?: No Would patient like information on creating a medical advance directive?: Yes (ED - Information included in AVS)  Depression screen:  See questionnaire below.     Depression screen [facility] 2/9 02/21/2021 09/11/2018 01/23/2018 08/24/2016  Decreased Interest 0 0 0 0  Down, Depressed, Hopeless 0 - 0 0  PHQ - 2 Score 0 0 0 0    Fall Screen: See Questionaire below.   Fall Risk  02/21/2021 09/11/2018 01/23/2018 08/24/2016  Falls in the past year? 0 0 No No  Number falls in past yr: 0 - - -  Injury with Fall? 0 - - -  Risk for fall due to : No Fall Risks - - -  Follow up Falls evaluation completed - - -    ADL screen:  See questionnaire below.  Functional Status Survey: Is the patient deaf or have difficulty hearing?: Yes (hearing aide) Does the patient have difficulty seeing, even when wearing glasses/contacts?: No Does the patient have difficulty concentrating, remembering, or making decisions?: No Does the patient have difficulty walking or climbing stairs?: No Does the patient have difficulty dressing or bathing?: No Does the patient have difficulty doing errands alone such as visiting a doctor's office or shopping?: No   Review of Systems  Constitutional: -, -unexpected weight change, -anorexia, -fatigue Allergy: -sneezing, -itching, -congestion Dermatology: denies changing moles, rash,  lumps ENT: -runny nose, -ear pain, -sore throat,  Cardiology:  -chest pain, -palpitations, -orthopnea, Respiratory: -cough, -shortness of breath, -dyspnea on exertion, -wheezing,  Gastroenterology: -abdominal pain, -nausea, -vomiting, -diarrhea, -constipation, -dysphagia Hematology: -bleeding or bruising problems Musculoskeletal: -arthralgias, -myalgias, -joint swelling, -back pain, - Ophthalmology: -vision changes,  Urology:  -dysuria, -difficulty urinating,  -urinary frequency, -urgency, incontinence Neurology: -, -numbness, , -memory loss, -falls, -dizziness    PHYSICAL EXAM: General Appearance: Alert, cooperative, no distress, appears stated age Head: Normocephalic, without obvious abnormality, atraumatic Eyes: PERRL, conjunctiva/corneas clear, EOM's intact, Ears: Normal TM's and external ear canals Nose: Nares normal, mucosa normal, no drainage or sinus   tenderness Throat: Lips, mucosa, and tongue normal; teeth and gums normal Neck: Supple, no lymphadenopathy, thyroid:no enlargement/tenderness/nodules; no carotid bruit or JVD Lungs: Clear to auscultation bilaterally without wheezes, rales or ronchi; respirations unlabored Heart: Regular rate and rhythm, S1 and S2 normal, no murmur, rub or gallop Abdomen: Soft, non-tender, nondistended, normoactive bowel sounds, no masses, no hepatosplenomegaly Skin: Skin color, texture, turgor normal, no rashes or lesions Lymph nodes: Cervical, supraclavicular, and axillary nodes normal Neurologic: CNII-XII intact, normal strength, sensation and gait; reflexes 2+ and symmetric throughout   Psych: Normal mood, affect, hygiene and grooming  ASSESSMENT/PLAN: Gallbladder cancer (HCC)  Atherosclerosis of aorta (HCC)  Primary hypertension  Diffuse large B-cell lymphoma of lymph nodes of neck (HCC)  History of prostate cancer  History of renal stone  Hyperlipidemia with target LDL less than 100 - Plan: Lipid panel  Presbycusis of both ears He seems to have a very good attitude towards the multiple cancers that he has. He continue wearing hearing aids.  Continue on present medications.  I did discuss the therapy he is getting from oncology.  Since he is really having no difficulty with this new medication I see no reason to change anything.  If he starts having more trouble with the medication we can certainly readdress the issue.  He was comfortable with that.   Immunization recommendations discussed.  He is to wait till next year and get the Tdap at the drugstore.  Colonoscopy recommendations reviewed.   Medicare Attestation I have personally reviewed: The patient's medical and social history Their use of alcohol, tobacco or illicit drugs Their current medications and supplements The patient's functional ability including ADLs,fall risks, home safety risks, cognitive, and hearing and visual impairment Diet and physical activities Evidence for depression or mood disorders  The patient's weight, height, and BMI have been recorded in the chart.  I have made referrals, counseling, and provided education to the patient based on review of the above and I have provided the patient with a written personalized care plan for preventive services.     Jill Alexanders, MD   02/21/2021

## 2021-02-22 LAB — LIPID PANEL
Chol/HDL Ratio: 4.5 ratio (ref 0.0–5.0)
Cholesterol, Total: 170 mg/dL (ref 100–199)
HDL: 38 mg/dL — ABNORMAL LOW (ref >39)
LDL Chol Calc (NIH): 103 mg/dL — ABNORMAL HIGH (ref 0–99)
Triglycerides: 163 mg/dL — ABNORMAL HIGH (ref 0–149)
VLDL Cholesterol Cal: 29 mg/dL (ref 5–40)

## 2021-02-25 ENCOUNTER — Inpatient Hospital Stay: Payer: Medicare Other

## 2021-02-25 ENCOUNTER — Inpatient Hospital Stay: Payer: Medicare Other | Attending: Hematology | Admitting: Hematology

## 2021-02-25 ENCOUNTER — Inpatient Hospital Stay: Payer: Medicare Other | Admitting: Dietician

## 2021-02-25 ENCOUNTER — Other Ambulatory Visit: Payer: Self-pay

## 2021-02-25 VITALS — BP 118/69 | HR 58 | Temp 98.4°F | Resp 16

## 2021-02-25 DIAGNOSIS — Z5112 Encounter for antineoplastic immunotherapy: Secondary | ICD-10-CM | POA: Insufficient documentation

## 2021-02-25 DIAGNOSIS — C23 Malignant neoplasm of gallbladder: Secondary | ICD-10-CM

## 2021-02-25 DIAGNOSIS — C8311 Mantle cell lymphoma, lymph nodes of head, face, and neck: Secondary | ICD-10-CM | POA: Insufficient documentation

## 2021-02-25 DIAGNOSIS — Z95828 Presence of other vascular implants and grafts: Secondary | ICD-10-CM

## 2021-02-25 DIAGNOSIS — C8331 Diffuse large B-cell lymphoma, lymph nodes of head, face, and neck: Secondary | ICD-10-CM

## 2021-02-25 DIAGNOSIS — C8318 Mantle cell lymphoma, lymph nodes of multiple sites: Secondary | ICD-10-CM

## 2021-02-25 DIAGNOSIS — Z79899 Other long term (current) drug therapy: Secondary | ICD-10-CM | POA: Insufficient documentation

## 2021-02-25 LAB — CMP (CANCER CENTER ONLY)
ALT: 11 U/L (ref 0–44)
AST: 19 U/L (ref 15–41)
Albumin: 3.8 g/dL (ref 3.5–5.0)
Alkaline Phosphatase: 103 U/L (ref 38–126)
Anion gap: 10 (ref 5–15)
BUN: 13 mg/dL (ref 8–23)
CO2: 26 mmol/L (ref 22–32)
Calcium: 8.7 mg/dL — ABNORMAL LOW (ref 8.9–10.3)
Chloride: 102 mmol/L (ref 98–111)
Creatinine: 0.81 mg/dL (ref 0.61–1.24)
GFR, Estimated: >60 mL/min (ref >60)
Glucose, Bld: 86 mg/dL (ref 70–99)
Potassium: 3.7 mmol/L (ref 3.5–5.1)
Sodium: 138 mmol/L (ref 135–145)
Total Bilirubin: 0.5 mg/dL (ref 0.3–1.2)
Total Protein: 6.9 g/dL (ref 6.5–8.1)

## 2021-02-25 LAB — CBC WITH DIFFERENTIAL/PLATELET
Abs Immature Granulocytes: 0.09 10*3/uL — ABNORMAL HIGH (ref 0.00–0.07)
Basophils Absolute: 0 10*3/uL (ref 0.0–0.1)
Basophils Relative: 0 %
Eosinophils Absolute: 0 10*3/uL (ref 0.0–0.5)
Eosinophils Relative: 1 %
HCT: 36.4 % — ABNORMAL LOW (ref 39.0–52.0)
Hemoglobin: 12.1 g/dL — ABNORMAL LOW (ref 13.0–17.0)
Immature Granulocytes: 2 %
Lymphocytes Relative: 31 %
Lymphs Abs: 1.8 10*3/uL (ref 0.7–4.0)
MCH: 29.3 pg (ref 26.0–34.0)
MCHC: 33.2 g/dL (ref 30.0–36.0)
MCV: 88.1 fL (ref 80.0–100.0)
Monocytes Absolute: 0.8 10*3/uL (ref 0.1–1.0)
Monocytes Relative: 13 %
Neutro Abs: 3.2 10*3/uL (ref 1.7–7.7)
Neutrophils Relative %: 53 %
Platelets: 101 10*3/uL — ABNORMAL LOW (ref 150–400)
RBC: 4.13 MIL/uL — ABNORMAL LOW (ref 4.22–5.81)
RDW: 13.8 % (ref 11.5–15.5)
WBC: 5.9 10*3/uL (ref 4.0–10.5)
nRBC: 0 % (ref 0.0–0.2)

## 2021-02-25 LAB — LACTATE DEHYDROGENASE: LDH: 188 U/L (ref 98–192)

## 2021-02-25 MED ORDER — SODIUM CHLORIDE 0.9 % IV SOLN: Freq: Once | INTRAVENOUS | Status: AC

## 2021-02-25 MED ORDER — SODIUM CHLORIDE 0.9% FLUSH
10.0000 mL | INTRAVENOUS | Status: DC | PRN
  Administered 2021-02-25: 10 mL

## 2021-02-25 MED ORDER — SODIUM CHLORIDE 0.9 % IV SOLN
375.0000 mg/m2 | Freq: Once | INTRAVENOUS | Status: AC
  Administered 2021-02-25: 700 mg via INTRAVENOUS
  Filled 2021-02-25: qty 20

## 2021-02-25 MED ORDER — METHYLPREDNISOLONE SODIUM SUCC 125 MG IJ SOLR
80.0000 mg | Freq: Once | INTRAMUSCULAR | Status: AC
  Administered 2021-02-25: 80 mg via INTRAVENOUS
  Filled 2021-02-25: qty 2

## 2021-02-25 MED ORDER — IBRUTINIB 280 MG PO TABS: 280.0000 mg | ORAL_TABLET | Freq: Every day | ORAL | 2 refills | Status: DC

## 2021-02-25 MED ORDER — DIPHENHYDRAMINE HCL 25 MG PO CAPS
50.0000 mg | ORAL_CAPSULE | Freq: Once | ORAL | Status: AC
  Administered 2021-02-25: 50 mg via ORAL
  Filled 2021-02-25: qty 2

## 2021-02-25 MED ORDER — HEPARIN SOD (PORK) LOCK FLUSH 100 UNIT/ML IV SOLN
500.0000 [IU] | Freq: Once | INTRAVENOUS | Status: AC | PRN
  Administered 2021-02-25: 500 [IU]

## 2021-02-25 MED ORDER — ACETAMINOPHEN 325 MG PO TABS
650.0000 mg | ORAL_TABLET | Freq: Once | ORAL | Status: AC
  Administered 2021-02-25: 650 mg via ORAL
  Filled 2021-02-25: qty 2

## 2021-02-25 MED ORDER — FAMOTIDINE 20 MG IN NS 100 ML IVPB
20.0000 mg | Freq: Once | INTRAVENOUS | Status: AC
  Administered 2021-02-25: 20 mg via INTRAVENOUS
  Filled 2021-02-25: qty 100

## 2021-02-25 NOTE — Patient Instructions (Signed)
[facility]  Discharge Instructions: Thank you for choosing [facility] to provide your oncology and hematology care.   If you have a lab appointment with the Cancer Center, please go directly to the Cancer Center and check in at the registration area.   Wear comfortable clothing and clothing appropriate for easy access to any Portacath or PICC line.   We strive to give you quality time with your provider. You may need to reschedule your appointment if you arrive late (15 or more minutes).  Arriving late affects you and other patients whose appointments are after yours.  Also, if you miss three or more appointments without notifying the office, you may be dismissed from the clinic at the provider's discretion.      For prescription refill requests, have your pharmacy contact our office and allow 72 hours for refills to be completed.    Today you received the following chemotherapy and/or immunotherapy agents Rituxan      To help prevent nausea and vomiting after your treatment, we encourage you to take your nausea medication as directed.  BELOW ARE SYMPTOMS THAT SHOULD BE REPORTED IMMEDIATELY: *FEVER GREATER THAN 100.4 F (38 C) OR HIGHER *CHILLS OR SWEATING *NAUSEA AND VOMITING THAT IS NOT CONTROLLED WITH YOUR NAUSEA MEDICATION *UNUSUAL SHORTNESS OF BREATH *UNUSUAL BRUISING OR BLEEDING *URINARY PROBLEMS (pain or burning when urinating, or frequent urination) *BOWEL PROBLEMS (unusual diarrhea, constipation, pain near the anus) TENDERNESS IN MOUTH AND THROAT WITH OR WITHOUT PRESENCE OF ULCERS (sore throat, sores in mouth, or a toothache) UNUSUAL RASH, SWELLING OR PAIN  UNUSUAL VAGINAL DISCHARGE OR ITCHING   Items with * indicate a potential emergency and should be followed up as soon as possible or go to the Emergency Department if any problems should occur.  Please show the CHEMOTHERAPY ALERT CARD or IMMUNOTHERAPY ALERT CARD at check-in to the  Emergency Department and triage nurse.  Should you have questions after your visit or need to cancel or reschedule your appointment, please contact [facility]  Dept: [phone]  and follow the prompts.  Office hours are 8:00 a.m. to 4:30 p.m. Monday - Friday. Please note that voicemails left after 4:00 p.m. may not be returned until the following business day.  We are closed weekends and major holidays. You have access to a nurse at all times for urgent questions. Please call the main number to the clinic Dept: [phone] and follow the prompts.   For any non-urgent questions, you may also contact your provider using MyChart. We now offer e-Visits for anyone 18 and older to request care online for non-urgent symptoms. For details visit [email or web address].   Also download the MyChart app! Go to the app store, search "MyChart", open the app, select [place], and log in with your MyChart username and password.  Due to Covid, a mask is required upon entering the hospital/clinic. If you do not have a mask, one will be given to you upon arrival. For doctor visits, patients may have 1 support person aged 18 or older with them. For treatment visits, patients cannot have anyone with them due to current Covid guidelines and our immunocompromised population.   

## 2021-02-25 NOTE — Progress Notes (Signed)
Patient identified on MST for weight loss  Met with patient during infusion. Introduced self and services available at [facility]. He reports decreased appetite after recent cholecystectomy. This has improved and weights are trending up. Patient reports one day of poor appetite and fatigue after treatment. He denies nutrition impact symptoms. Patient provided contact information. He was encouraged to contact with nutrition questions/concerns. Patient appreciative of visit.   Wt Readings from Last 3 Encounters:  02/25/21 152 lb 9.6 oz (69.2 kg)  02/21/21 151 lb 12.8 oz (68.9 kg)  01/24/21 148 lb 12.8 oz (67.5 kg)    Mario Garcia, [place] [facility] [phone]

## 2021-02-26 LAB — CANCER ANTIGEN 19-9: CA 19-9: 93 U/mL — ABNORMAL HIGH (ref 0–35)

## 2021-02-28 ENCOUNTER — Telehealth: Payer: Self-pay | Admitting: Hematology

## 2021-02-28 ENCOUNTER — Other Ambulatory Visit: Payer: Self-pay | Admitting: Hematology

## 2021-02-28 NOTE — Telephone Encounter (Signed)
Scheduled follow-up appointment per 12/2 los. Patient is aware.

## 2021-03-03 ENCOUNTER — Encounter: Payer: Self-pay | Admitting: Hematology

## 2021-03-03 NOTE — Progress Notes (Addendum)
HEMATOLOGY/ONCOLOGY CLINIC NOTE  Date of Service: .02/25/2021   Patient Care Team: Denita Lung, MD as PCP - General (Family Medicine) Brunetta Genera, MD as Consulting Physician (Hematology)  CHIEF COMPLAINTS/PURPOSE OF CONSULTATION:  Follow-up for Continued management of mantle cell lymphoma and gallbladder adenocarcinoma. History of DLBCL  HISTORY OF PRESENTING ILLNESS:  Please see previous HPI for details  INTERVAL HISTORY:   Mario Garcia returns today for management and evaluation of his mantle cell lymphoma.  The patient's last visit with [place] was on 01/24/2021. Patient notes he has been doing well. Tolerating ibrutinib at 280 mg p.o. daily without any overt toxicities.  No bleeding issues.  No chest pain or shortness of breath..  No diarrhea.  No regular toxicities from his last dose of Rituxan. He has healed up well from his gallbladder surgery and does not have any density of significant abdominal discomfort. No fevers no chills no night sweats no unexpected weight loss. Labs done today reviewed in details.   MEDICAL HISTORY:  Past Medical History:  Diagnosis Date   Arthritis    Cancer ([place])    PROSTATE   History of colonic polyps    History of kidney stones    Hypertension    Inguinal hernia 03/2002   Pneumonia    "years ago"    SURGICAL HISTORY: Past Surgical History:  Procedure Laterality Date   CATARACT EXTRACTION Bilateral    CHOLECYSTECTOMY N/A 12/14/2020   Procedure: LAPAROSCOPIC CHOLECYSTECTOMY;  Surgeon: Stark Klein, MD;  Location: [facility];  Service: General;  Laterality: N/A;   COLONOSCOPY     DIRECT LARYNGOSCOPY N/A 07/08/2018   Procedure: DIRECT LARYNGOSCOPY;  Surgeon: Leta Baptist, MD;  Location: [facility];  Service: ENT;  Laterality: N/A;   ESOPHAGOSCOPY N/A 07/08/2018   Procedure: ESOPHAGOSCOPY;  Surgeon: Leta Baptist, MD;  Location: [facility];  Service: ENT;  Laterality: N/A;   EYE SURGERY     FLEXIBLE BRONCHOSCOPY N/A 07/08/2018    Procedure: FLEXIBLE BRONCHOSCOPY;  Surgeon: Leta Baptist, MD;  Location: [place];  Service: ENT;  Laterality: N/A;   HERNIA REPAIR Left    inguinal hernia   INGUINAL HERNIA REPAIR Right 09/03/2020   Procedure: OPEN RIGHT INGUINAL HERNIA REPAIR;  Surgeon: Clovis Riley, MD;  Location: [facility];  Service: General;  Laterality: Right;   IR IMAGING GUIDED PORT INSERTION  07/30/2018   MASS BIOPSY Right 07/08/2018   Procedure: RIGHT NECK MASS EXCISIONAL BIOPSY;  Surgeon: Leta Baptist, MD;  Location: [facility];  Service: ENT;  Laterality: Right;   PATELLA FRACTURE SURGERY Left    PROSTATE SURGERY  10/2005   RADIAL PROSTATECTOMY   ROTATOR CUFF REPAIR Bilateral     SOCIAL HISTORY: Social History   Socioeconomic History   Marital status: Married    Spouse name: Not on file   Number of children: Not on file   Years of education: Not on file   Highest education level: Not on file  Occupational History   Not on file  Tobacco Use   Smoking status: Former   Smokeless tobacco: Never   Tobacco comments:    stopped at the age of  75  Vaping Use   Vaping Use: Never used  Substance and Sexual Activity   Alcohol use: Not Currently    Alcohol/week: 6.0 standard drinks    Types: 6 Standard drinks or equivalent per week   Drug use: No   Sexual activity: Not Currently  Other Topics Concern   Not  on file  Social History Narrative   Not on file   Social Determinants of Health   Financial Resource Strain: Not on file  Food Insecurity: Not on file  Transportation Needs: Not on file  Physical Activity: Not on file  Stress: Not on file  Social Connections: Not on file  Intimate Partner Violence: Not on file    FAMILY HISTORY: Family History  Problem Relation Age of Onset   Cancer Father    Cancer Brother     ALLERGIES:  is allergic to lisinopril and penicillins.  MEDICATIONS:  Current Outpatient Medications  Medication Sig Dispense Refill   azithromycin (ZITHROMAX) 250 MG tablet Take 2 tablets  PO on day one, and one tablet PO daily thereafter until completed. (Patient not taking: Reported on 01/24/2021) 6 tablet 0   ibrutinib 280 MG TABS Take 1 tablet (280 mg) by mouth daily. Administer with a glass of water at approximately the same time each day. Swallow capsules or tablets whole. Do not open, break, or chew capsules; do not cut, crush, or chew tablets. Maintain adequate hydration during treatment. 30 tablet 2   latanoprost (XALATAN) 0.005 % ophthalmic solution Place 1 drop into both eyes at bedtime.     losartan-hydrochlorothiazide (HYZAAR) 50-12.5 MG tablet TAKE 1 TABLET BY MOUTH EVERY DAY 90 tablet 1   Multiple Vitamins-Minerals (ICAPS AREDS 2 PO) Take 1 tablet by mouth daily.     oxyCODONE (OXY IR/ROXICODONE) 5 MG immediate release tablet Take 0.5-1 tablets (2.5-5 mg total) by mouth every 6 (six) hours as needed for severe pain. (Patient not taking: Reported on 01/24/2021) 8 tablet 0   pravastatin (PRAVACHOL) 40 MG tablet Take 1 tablet by mouth once daily 90 tablet 0   No current facility-administered medications for this visit.    REVIEW OF SYSTEMS:   .10 Point review of Systems was done is negative except as noted above.    PHYSICAL EXAMINATION: ECOG FS:1 - Symptomatic but completely ambulatory  Vitals:   02/25/21 1136  BP: (!) 141/74  Pulse: 66  Resp: 16  Temp: (!) 97.5 F (36.4 C)  SpO2: 100%     Wt Readings from Last 3 Encounters:  02/25/21 152 lb 9.6 oz (69.2 kg)  02/21/21 151 lb 12.8 oz (68.9 kg)  01/24/21 148 lb 12.8 oz (67.5 kg)   Body mass index is 23.2 kg/m.   Marland Kitchen GENERAL:alert, in no acute distress and comfortable SKIN: no acute rashes, no significant lesions EYES: conjunctiva are pink and non-injected, sclera anicteric OROPHARYNX: MMM, no exudates, no oropharyngeal erythema or ulceration NECK: supple, no JVD LYMPH:  no palpable lymphadenopathy in the cervical, axillary or inguinal regions LUNGS: clear to auscultation b/l with normal respiratory  effort HEART: regular rate & rhythm ABDOMEN:  normoactive bowel sounds , non tender, not distended. Extremity: no pedal edema PSYCH: alert & oriented x 3 with fluent speech NEURO: no focal motor/sensory deficits     LABORATORY DATA:  I have reviewed the data as listed  . CBC Latest Ref Rng & Units 02/25/2021 01/24/2021 12/27/2020  WBC 4.0 - 10.5 K/uL 5.9 3.3(L) 7.9  Hemoglobin 13.0 - 17.0 g/dL 12.1(L) 11.5(L) 11.7(L)  Hematocrit 39.0 - 52.0 % 36.4(L) 34.4(L) 35.6(L)  Platelets 150 - 400 K/uL 101(L) 148(L) 304    . CMP Latest Ref Rng & Units 02/25/2021 01/24/2021 12/27/2020  Glucose 70 - 99 mg/dL 86 108(H) 118(H)  BUN 8 - 23 mg/dL _0 Creatinine 0.61 - 1.24 mg/dL 0.81 0.76 0.77  Sodium 135 - 145 mmol/L 138 140 139  Potassium 3.5 - 5.1 mmol/L 3.7 3.4(L) 3.9  Chloride 98 - 111 mmol/L 102 103 103  CO2 22 - 32 mmol/L _0 Calcium 8.9 - 10.3 mg/dL 8.7(L) 8.7(L) 8.9  Total Protein 6.5 - 8.1 g/dL 6.9 6.8 6.8  Total Bilirubin 0.3 - 1.2 mg/dL 0.5 0.5 0.5  Alkaline Phos 38 - 126 U/L 103 115 285(H)  AST 15 - 41 U/L 19 22 33  ALT 0 - 44 U/L 11 17 38   . Lab Results  Component Value Date   LDH 188 02/25/2021    02/23/2020 Right Cervical Lymph Node Surgical Pathology [phone]):    02/23/2020 FISH Analysis:   02/23/2020 FISH Mutation Analysis:    03/03/2019 NM PET Image Restag (PS) Skull Base To Thigh (Accession [id number]):   07/08/18 Right Cervical Tissue Biopsy:    RADIOGRAPHIC STUDIES: I have personally reviewed the radiological images as listed and agreed with the findings in the report. No results found.  ASSESSMENT & PLAN:   87 y.o. male with  1. H/o Diffuse Large B-Cell Lymphoma, Stage IV- now in remission Presenting without constitutional symptoms. Palpable right cervical and supraclavicular lymphadenopathy.  07/08/18 Right cervical soft tissue biopsy revealed Diffuse Large B-Cell Lymphoma, germinal center type  06/05/18 CT Neck revealed  Pathologic RIGHT-sided level II, III, IV, and V lymphadenopathy, most consistent with metastatic carcinoma. An obvious primary source is not identified. Tissue sampling is warranted. 07/16/18 Hep B and Hep C negative 07/17/18 ECHO revealed LV EF of 60-65% 07/22/18 PET/CT revealed "Hypermetabolic adenopathy especially concentrated in the right neck but also in the left neck, chest, abdomen, and pelvis. The adenopathy is primarily Deauville 5 although some few scattered smaller lesions are Deauville 4. 2. Diffuse abnormal splenic activity, Deauville 5, without overt splenomegaly. 3. There is also hypermetabolic Deauville 5 activity in the mildly thickened distal appendix, and raising suspicion for involvement of the appendix. 4. Other imaging findings of potential clinical significance: Aortic Atherosclerosis. Coronary atherosclerosis." 10/03/2018 PET scan revealed "Interval response to therapy with stable to decreased size of lymph nodes on CT and generalized decrease in hypermetabolism of the abnormal nodes. Hypermetabolism in the lymph nodes today is compatible with a combination of Deauville 3 and Deauville 4 disease. Stable tiny bilateral pulmonary nodules. Increase radiotracer accumulation in the marrow space on today's study, presumably representing marrow stimulatory effects of therapy."  2. Currently with new Non-Hodgkin's B-cell Lymphoma - consistent with Mantle Cell 02/23/2020 Right Cervical Lymph Node Surgical Pathology [id number]) which revealed "Non-Hodgkin B-cell lymphoma. Ki-67 generally shows low expression (<5-15%)." 02/23/2020 FISH Mutation Analysis which revealed "t(11;14): DETECTED".  3.  Recently diagnosed gallbladder adenocarcinoma status postcholecystectomy on 12/29/2020 -Pathology from his recent gallbladder surgery showed GALLBLADDER, CHOLECYSTECTOMY:  - Adenocarcinoma, 2.4 cm maximally, with perforation of serosa. Negative for carcinoma in one lymph node. Cholelithiasis.  CA  19-9 tumor marker somewhat elevated at 82 but this could be from biliary tract surgery.  We will continue to follow. PLAN -Labs reviewed with the patient today.  CBC with normal WBC count and hemoglobin, mild thrombocytopenia platelets 101k CMP stable LDH within normal limits -Patient has no lab or clinical symptoms suggestive of mantle cell lymphoma progression at this time. -No significant toxicities from the patient's ibrutinib at 280 mg p.o. daily.  We shall continue the same dose of ibrutinib at this time and increase the dose on next visit if still well-tolerated and blood counts stable. -No reported toxicities from  his last cycle of Rituximab. -Continue Rituxan every 2 months orders reviewed and placed.  We will continue with the same supportive medications. -Please schedule next 2 doses of maintenance Rituxan with port flush labs and MD visits.   FOLLOW UP: Please schedule next 2 doses of maintenance Rituxan with port flush labs and MD visits.   . The total time spent in the appointment was 34 minutes most of the time spent on assessment of his mantle cell lymphoma and symptoms of gallbladder cancer recurrence, evaluation, toxicity assessment and ordering and management of ibrutinib and Rituxan.  Coordination of care with pharmacy.  All of the patient's questions were answered with apparent satisfaction. The patient knows to call the clinic with any problems, questions or concerns.   Sullivan Lone MD [place] AAHIVMS [facility] Hematology/Oncology Physician [facility] ([facility])

## 2021-03-08 DIAGNOSIS — L570 Actinic keratosis: Secondary | ICD-10-CM | POA: Diagnosis not present

## 2021-03-08 DIAGNOSIS — Z85828 Personal history of other malignant neoplasm of skin: Secondary | ICD-10-CM | POA: Diagnosis not present

## 2021-03-08 DIAGNOSIS — L821 Other seborrheic keratosis: Secondary | ICD-10-CM | POA: Diagnosis not present

## 2021-03-08 DIAGNOSIS — L300 Nummular dermatitis: Secondary | ICD-10-CM | POA: Diagnosis not present

## 2021-04-04 DIAGNOSIS — Z20822 Contact with and (suspected) exposure to covid-19: Secondary | ICD-10-CM | POA: Diagnosis not present

## 2021-04-12 ENCOUNTER — Other Ambulatory Visit: Payer: Self-pay | Admitting: Family Medicine

## 2021-04-12 DIAGNOSIS — E785 Hyperlipidemia, unspecified: Secondary | ICD-10-CM

## 2021-04-17 ENCOUNTER — Other Ambulatory Visit: Payer: Self-pay | Admitting: Family Medicine

## 2021-04-17 DIAGNOSIS — I10 Essential (primary) hypertension: Secondary | ICD-10-CM

## 2021-04-22 ENCOUNTER — Other Ambulatory Visit: Payer: Self-pay | Admitting: *Deleted

## 2021-04-22 DIAGNOSIS — C8331 Diffuse large B-cell lymphoma, lymph nodes of head, face, and neck: Secondary | ICD-10-CM

## 2021-04-22 DIAGNOSIS — C23 Malignant neoplasm of gallbladder: Secondary | ICD-10-CM

## 2021-04-22 DIAGNOSIS — C8318 Mantle cell lymphoma, lymph nodes of multiple sites: Secondary | ICD-10-CM

## 2021-04-25 ENCOUNTER — Other Ambulatory Visit: Payer: Self-pay

## 2021-04-25 ENCOUNTER — Inpatient Hospital Stay: Payer: Medicare Other

## 2021-04-25 ENCOUNTER — Inpatient Hospital Stay (HOSPITAL_BASED_OUTPATIENT_CLINIC_OR_DEPARTMENT_OTHER): Payer: Medicare Other | Admitting: Hematology

## 2021-04-25 ENCOUNTER — Inpatient Hospital Stay: Payer: Medicare Other | Attending: Hematology

## 2021-04-25 VITALS — BP 144/49 | HR 67 | Temp 98.2°F | Resp 18

## 2021-04-25 VITALS — BP 133/59 | HR 70 | Temp 97.8°F | Resp 16 | Ht 68.0 in | Wt 155.3 lb

## 2021-04-25 DIAGNOSIS — C8311 Mantle cell lymphoma, lymph nodes of head, face, and neck: Secondary | ICD-10-CM | POA: Insufficient documentation

## 2021-04-25 DIAGNOSIS — Z79899 Other long term (current) drug therapy: Secondary | ICD-10-CM | POA: Insufficient documentation

## 2021-04-25 DIAGNOSIS — C8331 Diffuse large B-cell lymphoma, lymph nodes of head, face, and neck: Secondary | ICD-10-CM

## 2021-04-25 DIAGNOSIS — C8318 Mantle cell lymphoma, lymph nodes of multiple sites: Secondary | ICD-10-CM | POA: Diagnosis not present

## 2021-04-25 DIAGNOSIS — C23 Malignant neoplasm of gallbladder: Secondary | ICD-10-CM | POA: Insufficient documentation

## 2021-04-25 DIAGNOSIS — R970 Elevated carcinoembryonic antigen [CEA]: Secondary | ICD-10-CM | POA: Insufficient documentation

## 2021-04-25 DIAGNOSIS — Z95828 Presence of other vascular implants and grafts: Secondary | ICD-10-CM

## 2021-04-25 DIAGNOSIS — Z5112 Encounter for antineoplastic immunotherapy: Secondary | ICD-10-CM | POA: Insufficient documentation

## 2021-04-25 LAB — CMP (CANCER CENTER ONLY)
ALT: 12 U/L (ref 0–44)
AST: 21 U/L (ref 15–41)
Albumin: 3.9 g/dL (ref 3.5–5.0)
Alkaline Phosphatase: 110 U/L (ref 38–126)
Anion gap: 8 (ref 5–15)
BUN: 20 mg/dL (ref 8–23)
CO2: 29 mmol/L (ref 22–32)
Calcium: 9 mg/dL (ref 8.9–10.3)
Chloride: 100 mmol/L (ref 98–111)
Creatinine: 0.89 mg/dL (ref 0.61–1.24)
GFR, Estimated: >60 mL/min (ref >60)
Glucose, Bld: 93 mg/dL (ref 70–99)
Potassium: 3.9 mmol/L (ref 3.5–5.1)
Sodium: 137 mmol/L (ref 135–145)
Total Bilirubin: 0.5 mg/dL (ref 0.3–1.2)
Total Protein: 6.8 g/dL (ref 6.5–8.1)

## 2021-04-25 LAB — CBC WITH DIFFERENTIAL (CANCER CENTER ONLY)
Abs Immature Granulocytes: 0.12 10*3/uL — ABNORMAL HIGH (ref 0.00–0.07)
Basophils Absolute: 0 10*3/uL (ref 0.0–0.1)
Basophils Relative: 0 %
Eosinophils Absolute: 0 10*3/uL (ref 0.0–0.5)
Eosinophils Relative: 0 %
HCT: 37.2 % — ABNORMAL LOW (ref 39.0–52.0)
Hemoglobin: 11.8 g/dL — ABNORMAL LOW (ref 13.0–17.0)
Immature Granulocytes: 1 %
Lymphocytes Relative: 38 %
Lymphs Abs: 3.4 10*3/uL (ref 0.7–4.0)
MCH: 28.6 pg (ref 26.0–34.0)
MCHC: 31.7 g/dL (ref 30.0–36.0)
MCV: 90.3 fL (ref 80.0–100.0)
Monocytes Absolute: 1 10*3/uL (ref 0.1–1.0)
Monocytes Relative: 11 %
Neutro Abs: 4.3 10*3/uL (ref 1.7–7.7)
Neutrophils Relative %: 50 %
Platelet Count: 114 10*3/uL — ABNORMAL LOW (ref 150–400)
RBC: 4.12 MIL/uL — ABNORMAL LOW (ref 4.22–5.81)
RDW: 13.1 % (ref 11.5–15.5)
WBC Count: 8.8 10*3/uL (ref 4.0–10.5)
nRBC: 0 % (ref 0.0–0.2)

## 2021-04-25 LAB — LACTATE DEHYDROGENASE: LDH: 195 U/L — ABNORMAL HIGH (ref 98–192)

## 2021-04-25 MED ORDER — SODIUM CHLORIDE 0.9% FLUSH
10.0000 mL | INTRAVENOUS | Status: DC | PRN
  Administered 2021-04-25: 10 mL

## 2021-04-25 MED ORDER — ACETAMINOPHEN 325 MG PO TABS
650.0000 mg | ORAL_TABLET | Freq: Once | ORAL | Status: AC
  Administered 2021-04-25: 650 mg via ORAL
  Filled 2021-04-25: qty 2

## 2021-04-25 MED ORDER — FAMOTIDINE IN NACL 20-0.9 MG/50ML-% IV SOLN
20.0000 mg | Freq: Once | INTRAVENOUS | Status: AC
  Administered 2021-04-25: 20 mg via INTRAVENOUS
  Filled 2021-04-25: qty 50

## 2021-04-25 MED ORDER — SODIUM CHLORIDE 0.9 % IV SOLN
375.0000 mg/m2 | Freq: Once | INTRAVENOUS | Status: AC
  Administered 2021-04-25: 700 mg via INTRAVENOUS
  Filled 2021-04-25: qty 20

## 2021-04-25 MED ORDER — HEPARIN SOD (PORK) LOCK FLUSH 100 UNIT/ML IV SOLN
500.0000 [IU] | Freq: Once | INTRAVENOUS | Status: AC | PRN
  Administered 2021-04-25: 500 [IU]

## 2021-04-25 MED ORDER — METHYLPREDNISOLONE SODIUM SUCC 125 MG IJ SOLR
80.0000 mg | Freq: Once | INTRAMUSCULAR | Status: AC
  Administered 2021-04-25: 80 mg via INTRAVENOUS
  Filled 2021-04-25: qty 2

## 2021-04-25 MED ORDER — SODIUM CHLORIDE 0.9 % IV SOLN: Freq: Once | INTRAVENOUS | Status: AC

## 2021-04-25 MED ORDER — DIPHENHYDRAMINE HCL 25 MG PO CAPS
50.0000 mg | ORAL_CAPSULE | Freq: Once | ORAL | Status: AC
  Administered 2021-04-25: 50 mg via ORAL
  Filled 2021-04-25: qty 2

## 2021-04-25 NOTE — Patient Instructions (Signed)
[facility]ICAL [facility]COLOG[facility]  Discharge Instructions: Thank you for choosing [facility] to provide your oncology and hematology care.   If you have a lab appointment with the Cancer Center, please go directly to the Cancer Center and check in at the registration area.   Wear comfortable clothing and clothing appropriate for easy access to any Portacath or PICC line.   We strive to give you quality time with your provider. You may need to reschedule your appointment if you arrive late (15 or more minutes).  Arriving late affects you and other patients whose appointments are after yours.  Also, if you miss three or more appointments without notifying the office, you may be dismissed from the clinic at the provider's discretion.      For prescription refill requests, have your pharmacy contact our office and allow 72 hours for refills to be completed.    Today you received the following chemotherapy and/or immunotherapy agents Rituxan      To help prevent nausea and vomiting after your treatment, we encourage you to take your nausea medication as directed.  BELOW ARE SYMPTOMS THAT SHOULD BE REPORTED IMMEDIATELY: *FEVER GREATER THAN 100.4 F (38 C) OR HIGHER *CHILLS OR SWEATING *NAUSEA AND VOMITING THAT IS NOT CONTROLLED WITH YOUR NAUSEA MEDICATION *UNUSUAL SHORTNESS OF BREATH *UNUSUAL BRUISING OR BLEEDING *URINARY PROBLEMS (pain or burning when urinating, or frequent urination) *BOWEL PROBLEMS (unusual diarrhea, constipation, pain near the anus) TENDERNESS IN MOUTH AND THROAT WITH OR WITHOUT PRESENCE OF ULCERS (sore throat, sores in mouth, or a toothache) UNUSUAL RASH, SWELLING OR PAIN  UNUSUAL VAGINAL DISCHARGE OR ITCHING   Items with * indicate a potential emergency and should be followed up as soon as possible or go to the Emergency Department if any problems should occur.  Please show the CHEMOTHERAPY ALERT CARD or IMMUNOTHERAPY ALERT CARD at check-in to the  Emergency Department and triage nurse.  Should you have questions after your visit or need to cancel or reschedule your appointment, please contact [facility]  Dept: [phone]  and follow the prompts.  Office hours are 8:00 a.m. to 4:30 p.m. Monday - Friday. Please note that voicemails left after 4:00 p.m. may not be returned until the following business day.  We are closed weekends and major holidays. You have access to a nurse at all times for urgent questions. Please call the main number to the clinic Dept: [phone] and follow the prompts.   For any non-urgent questions, you may also contact your provider using MyChart. We now offer e-Visits for anyone 18 and older to request care online for non-urgent symptoms. For details visit [email or web address].   Also download the MyChart app! Go to the app store, search "MyChart", open the app, select [facility], and log in with your MyChart username and password.  Due to Covid, a mask is required upon entering the hospital/clinic. If you do not have a mask, one will be given to you upon arrival. For doctor visits, patients may have 1 support person aged 18 or older with them. For treatment visits, patients cannot have anyone with them due to current Covid guidelines and our immunocompromised population.   

## 2021-04-26 LAB — CANCER ANTIGEN 19-9: CA 19-9: 139 U/mL — ABNORMAL HIGH (ref 0–35)

## 2021-04-27 ENCOUNTER — Telehealth: Payer: Self-pay | Admitting: Hematology

## 2021-04-27 NOTE — Telephone Encounter (Signed)
Left message with follow-up appointment per 1/30 los.

## 2021-05-01 ENCOUNTER — Encounter: Payer: Self-pay | Admitting: Hematology

## 2021-05-01 NOTE — Progress Notes (Signed)
HEMATOLOGY/ONCOLOGY CLINIC NOTE  Date of Service: .04/25/2021   Patient Care Team: Denita Lung, MD as PCP - General (Family Medicine) Brunetta Genera, MD as Consulting Physician (Hematology)  CHIEF COMPLAINTS/PURPOSE OF CONSULTATION:  Follow-up for Continued management of mantle cell lymphoma and gallbladder adenocarcinoma. History of DLBCL  HISTORY OF PRESENTING ILLNESS:  Please see previous HPI for details  INTERVAL HISTORY:   Mario ESTIS is here for continued evaluation and management of his mantle cell lymphoma gallbladder adenocarcinoma. He has continued to take ibrutinib 280 mg p.o. daily without any notable toxicities.  No bleeding issues.  No abnormal bruising.  No overt fluid retention. No new abdominal pain or distention. No new infection issues. Labs done today reviewed with him CBC stable with hemoglobin of 11.8 with normal WBC count.  Mild stable thrombocytopenia platelets 114k CMP within normal limits LDH 195 CA 19-9 levels have gradually increased over the last few months.  They are up to 139. We discussed that this is concerning given his previous history of gallbladder cancer and we would have to repeat imaging to evaluate this further with a PET CT scan.  Patient is agreeable to this.  He does not have any focal abdominal symptoms.  Liver function tests were within normal limits today.  MEDICAL HISTORY:  Past Medical History:  Diagnosis Date   Arthritis    Cancer ([place])    PROSTATE   History of colonic polyps    History of kidney stones    Hypertension    Inguinal hernia 03/2002   Pneumonia    "years ago"    SURGICAL HISTORY: Past Surgical History:  Procedure Laterality Date   CATARACT EXTRACTION Bilateral    CHOLECYSTECTOMY N/A 12/14/2020   Procedure: LAPAROSCOPIC CHOLECYSTECTOMY;  Surgeon: Stark Klein, MD;  Location: [facility];  Service: General;  Laterality: N/A;   COLONOSCOPY     DIRECT LARYNGOSCOPY N/A 07/08/2018   Procedure:  DIRECT LARYNGOSCOPY;  Surgeon: Leta Baptist, MD;  Location: [facility];  Service: ENT;  Laterality: N/A;   ESOPHAGOSCOPY N/A 07/08/2018   Procedure: ESOPHAGOSCOPY;  Surgeon: Leta Baptist, MD;  Location: [facility];  Service: ENT;  Laterality: N/A;   [place] N/A 07/08/2018   Procedure: FLEXIBLE BRONCHOSCOPY;  Surgeon: Leta Baptist, MD;  Location: [facility];  Service: ENT;  Laterality: N/A;   HERNIA REPAIR Left    inguinal hernia   INGUINAL HERNIA REPAIR Right 09/03/2020   Procedure: OPEN RIGHT INGUINAL HERNIA REPAIR;  Surgeon: Clovis Riley, MD;  Location: [facility];  Service: General;  Laterality: Right;   IR IMAGING GUIDED PORT INSERTION  07/30/2018   MASS BIOPSY Right 07/08/2018   Procedure: RIGHT NECK MASS EXCISIONAL BIOPSY;  Surgeon: Leta Baptist, MD;  Location: [facility];  Service: ENT;  Laterality: Right;   PATELLA FRACTURE SURGERY Left    PROSTATE SURGERY  10/2005   RADIAL PROSTATECTOMY   ROTATOR CUFF REPAIR Bilateral     SOCIAL HISTORY: Social History   Socioeconomic History   Marital status: Married    Spouse name: Not on file   Number of children: Not on file   Years of education: Not on file   Highest education level: Not on file  Occupational History   Not on file  Tobacco Use   Smoking status: Former   Smokeless tobacco: Never   Tobacco comments:    stopped at the age of  28  Vaping Use   Vaping Use: Never  used  Substance and Sexual Activity   Alcohol use: Not Currently    Alcohol/week: 6.0 standard drinks    Types: 6 Standard drinks or equivalent per week   Drug use: No   Sexual activity: Not Currently  Other Topics Concern   Not on file  Social History Narrative   Not on file   Social Determinants of Health   Financial Resource Strain: Not on file  Food Insecurity: Not on file  Transportation Needs: Not on file  Physical Activity: Not on file  Stress: Not on file  Social Connections: Not on file  Intimate Partner Violence: Not on file    FAMILY  HISTORY: Family History  Problem Relation Age of Onset   Cancer Father    Cancer Brother     ALLERGIES:  is allergic to lisinopril and penicillins.  MEDICATIONS:  Current Outpatient Medications  Medication Sig Dispense Refill   ibrutinib 280 MG TABS Take 1 tablet (280 mg) by mouth daily. Administer with a glass of water at approximately the same time each day. Swallow capsules or tablets whole. Do not open, break, or chew capsules; do not cut, crush, or chew tablets. Maintain adequate hydration during treatment. 30 tablet 2   latanoprost (XALATAN) 0.005 % ophthalmic solution Place 1 drop into both eyes at bedtime.     losartan-hydrochlorothiazide (HYZAAR) 50-12.5 MG tablet TAKE 1 TABLET BY MOUTH EVERY DAY 90 tablet 1   Multiple Vitamins-Minerals (ICAPS AREDS 2 PO) Take 1 tablet by mouth daily.     pravastatin (PRAVACHOL) 40 MG tablet Take 1 tablet by mouth once daily 90 tablet 1   azithromycin (ZITHROMAX) 250 MG tablet Take 2 tablets PO on day one, and one tablet PO daily thereafter until completed. 6 tablet 0   oxyCODONE (OXY IR/ROXICODONE) 5 MG immediate release tablet Take 0.5-1 tablets (2.5-5 mg total) by mouth every 6 (six) hours as needed for severe pain. 8 tablet 0   No current facility-administered medications for this visit.    REVIEW OF SYSTEMS:   .10 Point review of Systems was done is negative except as noted above.  PHYSICAL EXAMINATION: ECOG FS:1 - Symptomatic but completely ambulatory  Vitals:   04/25/21 1011  BP: (!) 133/59  Pulse: 70  Resp: 16  Temp: 97.8 F (36.6 C)  SpO2: 100%     Wt Readings from Last 3 Encounters:  04/25/21 155 lb 4.8 oz (70.4 kg)  02/25/21 152 lb 9.6 oz (69.2 kg)  02/21/21 151 lb 12.8 oz (68.9 kg)   Body mass index is 23.61 kg/m.   NAD . GENERAL:alert, in no acute distress and comfortable SKIN: no acute rashes, no significant lesions EYES: conjunctiva are pink and non-injected, sclera anicteric OROPHARYNX: MMM, no exudates, no  oropharyngeal erythema or ulceration NECK: supple, no JVD LYMPH:  no palpable lymphadenopathy in the cervical, axillary or inguinal regions LUNGS: clear to auscultation b/l with normal respiratory effort HEART: regular rate & rhythm ABDOMEN:  normoactive bowel sounds , non tender, not distended. Extremity: no pedal edema PSYCH: alert & oriented x 3 with fluent speech NEURO: no focal motor/sensory deficits   LABORATORY DATA:  I have reviewed the data as listed  . CBC Latest Ref Rng & Units 04/25/2021 02/25/2021 01/24/2021  WBC 4.0 - 10.5 K/uL 8.8 5.9 3.3(L)  Hemoglobin 13.0 - 17.0 g/dL 11.8(L) 12.1(L) 11.5(L)  Hematocrit 39.0 - 52.0 % 37.2(L) 36.4(L) 34.4(L)  Platelets 150 - 400 K/uL 114(L) 101(L) 148(L)    . CMP Latest Ref Rng &  Units 04/25/2021 02/25/2021 01/24/2021  Glucose 70 - 99 mg/dL 93 86 108(H)  BUN 8 - 23 mg/dL _0 Creatinine 0.61 - 1.24 mg/dL 0.89 0.81 0.76  Sodium 135 - 145 mmol/L 137 138 140  Potassium 3.5 - 5.1 mmol/L 3.9 3.7 3.4(L)  Chloride 98 - 111 mmol/L 100 102 103  CO2 22 - 32 mmol/L _1 Calcium 8.9 - 10.3 mg/dL 9.0 8.7(L) 8.7(L)  Total Protein 6.5 - 8.1 g/dL 6.8 6.9 6.8  Total Bilirubin 0.3 - 1.2 mg/dL 0.5 0.5 0.5  Alkaline Phos 38 - 126 U/L 110 103 115  AST 15 - 41 U/L _2 ALT 0 - 44 U/L _3 . Lab Results  Component Value Date   LDH 195 (H) 04/25/2021     02/23/2020 Right Cervical Lymph Node Surgical Pathology [phone]):    02/23/2020 FISH Analysis:   02/23/2020 FISH Mutation Analysis:    03/03/2019 NM PET Image Restag (PS) Skull Base To Thigh (Accession [id number]):   07/08/18 Right Cervical Tissue Biopsy:    RADIOGRAPHIC STUDIES: I have personally reviewed the radiological images as listed and agreed with the findings in the report. No results found.  ASSESSMENT & PLAN:   88 y.o. male with  1. H/o Diffuse Large B-Cell Lymphoma, Stage IV- now in remission Presenting without constitutional  symptoms. Palpable right cervical and supraclavicular lymphadenopathy.  07/08/18 Right cervical soft tissue biopsy revealed Diffuse Large B-Cell Lymphoma, germinal center type  06/05/18 CT Neck revealed Pathologic RIGHT-sided level II, III, IV, and V lymphadenopathy, most consistent with metastatic carcinoma. An obvious primary source is not identified. Tissue sampling is warranted. 07/16/18 Hep B and Hep C negative 07/17/18 ECHO revealed LV EF of 60-65% 07/22/18 PET/CT revealed "Hypermetabolic adenopathy especially concentrated in the right neck but also in the left neck, chest, abdomen, and pelvis. The adenopathy is primarily Deauville 5 although some few scattered smaller lesions are Deauville 4. 2. Diffuse abnormal splenic activity, Deauville 5, without overt splenomegaly. 3. There is also hypermetabolic Deauville 5 activity in the mildly thickened distal appendix, and raising suspicion for involvement of the appendix. 4. Other imaging findings of potential clinical significance: Aortic Atherosclerosis. Coronary atherosclerosis." 10/03/2018 PET scan revealed "Interval response to therapy with stable to decreased size of lymph nodes on CT and generalized decrease in hypermetabolism of the abnormal nodes. Hypermetabolism in the lymph nodes today is compatible with a combination of Deauville 3 and Deauville 4 disease. Stable tiny bilateral pulmonary nodules. Increase radiotracer accumulation in the marrow space on today's study, presumably representing marrow stimulatory effects of therapy."  2. Currently with new Non-Hodgkin's B-cell Lymphoma - consistent with Mantle Cell 02/23/2020 Right Cervical Lymph Node Surgical Pathology [phone]) which revealed "Non-Hodgkin B-cell lymphoma. Ki-67 generally shows low expression (<5-15%)." 02/23/2020 FISH Mutation Analysis which revealed "t(11;14): DETECTED".  3.  History of gallbladder adenocarcinoma status postcholecystectomy on 12/29/2020 -Pathology from his  recent gallbladder surgery showed GALLBLADDER, CHOLECYSTECTOMY:  - Adenocarcinoma, 2.4 cm maximally, with perforation of serosa. Negative for carcinoma in one lymph node. Cholelithiasis.  CA 19-9 tumor marker continues to gradually increase which is concerning. No new abdominal symptoms.  Normal liver function tests. PLAN -Labs done today reviewed with him CBC stable with hemoglobin of 11.8 with normal WBC count.  Mild stable thrombocytopenia platelets 114k CMP within normal limits LDH 195 CA 19-9 levels have gradually increased over the last few months.  They are up to 139. We discussed that this is  concerning given his previous history of gallbladder cancer and we would have to repeat imaging to evaluate this further with a PET CT scan.  Patient is agreeable to this. -PET CT scan ordered -No reported toxicities from the patient's ibrutinib 280 mg p.o. daily-which she will continue the same dose currently. -No reported toxicities from patient's Rituxan at this time and he is appropriate for his next dose of Rituxan today.   FOLLOW UP: PET/CT in 1 week Phone visit with Dr. Irene Limbo in 2 weeks   All of the patient's questions were answered with apparent satisfaction. The patient knows to call the clinic with any problems, questions or concerns.   Sullivan Lone MD [place] AAHIVMS [facility] Hematology/Oncology Physician [facility]

## 2021-05-04 ENCOUNTER — Ambulatory Visit (HOSPITAL_COMMUNITY)
Admission: RE | Admit: 2021-05-04 | Discharge: 2021-05-04 | Disposition: A | Discharge status: Home / Self Care | Payer: Medicare Other | Source: Ambulatory Visit | Attending: Hematology | Admitting: Hematology

## 2021-05-04 ENCOUNTER — Other Ambulatory Visit: Payer: Self-pay

## 2021-05-04 DIAGNOSIS — C8310 Mantle cell lymphoma, unspecified site: Secondary | ICD-10-CM | POA: Diagnosis not present

## 2021-05-04 DIAGNOSIS — C23 Malignant neoplasm of gallbladder: Secondary | ICD-10-CM | POA: Diagnosis not present

## 2021-05-04 DIAGNOSIS — Z9049 Acquired absence of other specified parts of digestive tract: Secondary | ICD-10-CM | POA: Diagnosis not present

## 2021-05-04 DIAGNOSIS — R590 Localized enlarged lymph nodes: Secondary | ICD-10-CM | POA: Diagnosis not present

## 2021-05-04 DIAGNOSIS — K7689 Other specified diseases of liver: Secondary | ICD-10-CM | POA: Diagnosis not present

## 2021-05-04 DIAGNOSIS — C8318 Mantle cell lymphoma, lymph nodes of multiple sites: Secondary | ICD-10-CM | POA: Insufficient documentation

## 2021-05-04 LAB — GLUCOSE, CAPILLARY: Glucose-Capillary: 88 mg/dL (ref 70–99)

## 2021-05-04 MED ORDER — FLUDEOXYGLUCOSE F - 18 (FDG) INJECTION
7.7000 | Freq: Once | INTRAVENOUS | Status: AC | PRN
  Administered 2021-05-04: 7.73 via INTRAVENOUS

## 2021-05-05 ENCOUNTER — Telehealth: Payer: Self-pay | Admitting: Family Medicine

## 2021-05-05 NOTE — Chronic Care Management (AMB) (Signed)
°  Chronic Care Management   Note  05/05/2021 Name: Mario Garcia MRN: [id number] DOB: June 10, 1931  Mario Garcia is a 88 y.o. year old male who is a primary care patient of [employer], Elyse Jarvis, MD. I reached out to Vanessa [place] by phone today in response to a referral sent by [name]'s PCP, Denita Lung, MD.   [name] was given information about Chronic Care Management services today including:  CCM service includes personalized support from designated clinical staff supervised by his physician, including individualized plan of care and coordination with other care providers 24/7 contact phone numbers for assistance for urgent and routine care needs. Service will only be billed when office clinical staff spend 20 minutes or more in a month to coordinate care. Only one practitioner may furnish and bill the service in a calendar month. The patient may stop CCM services at any time (effective at the end of the month) by phone call to the office staff.   Patient agreed to services and verbal consent obtained.   Follow up plan:   [place] Secretary/administrator

## 2021-05-09 ENCOUNTER — Ambulatory Visit: Payer: Medicare Other | Admitting: Hematology

## 2021-05-09 ENCOUNTER — Telehealth: Payer: Self-pay | Admitting: Pharmacist

## 2021-05-09 NOTE — Progress Notes (Signed)
A user error has taken place: encounter opened in error, closed for administrative reasons.

## 2021-05-10 ENCOUNTER — Inpatient Hospital Stay: Payer: Medicare Other | Attending: Hematology | Admitting: Hematology

## 2021-05-10 DIAGNOSIS — C23 Malignant neoplasm of gallbladder: Secondary | ICD-10-CM | POA: Diagnosis not present

## 2021-05-10 DIAGNOSIS — C787 Secondary malignant neoplasm of liver and intrahepatic bile duct: Secondary | ICD-10-CM | POA: Insufficient documentation

## 2021-05-10 DIAGNOSIS — C8310 Mantle cell lymphoma, unspecified site: Secondary | ICD-10-CM | POA: Insufficient documentation

## 2021-05-10 NOTE — Progress Notes (Signed)
HEMATOLOGY/ONCOLOGY PHONE VISIT NOTE  Date of Service: 05/10/2021  Patient Care Team: Denita Lung, MD as PCP - General (Family Medicine) Brunetta Genera, MD as Consulting Physician (Hematology) Viona Gilmore, [facility] as Pharmacist (Pharmacist)  CHIEF COMPLAINTS/PURPOSE OF CONSULTATION:  Follow-up for continued management of mantle cell lymphoma and gallbladder adenocarcinoma  HISTORY OF PRESENTING ILLNESS:  Please see previous HPI for details  INTERVAL HISTORY:   I connected with  Mario Garcia on 05/10/21 by a video enabled telemedicine application and verified that I am speaking with the correct person using two identifiers. I discussed the limitations of evaluation and management by telemedicine. The patient expressed understanding and agreed to proceed.  Mario Garcia was going to for continued evaluation and management of his mantle cell lymphoma gallbladder adenocarcinoma. He has continued to take ibrutinib 280 mg p.o. daily without any notable toxicities. Pt has been experiencing GERD and drowsiness. He has been taking Tums to manage his symptoms.   He had a PET CT scan 05/04/2021 which shows marked progression of lymphoma with increased cervical, axillary mediastinal adenopathy new hypermetabolic lymphoma involvement of the spleen. He also has progression of hypermetabolic para-aortic retroperitoneal iliac and inguinal adenopathy. Interval increase in size of solitary hypermetabolic Hepatic lesion     MEDICAL HISTORY:  Past Medical History:  Diagnosis Date   Arthritis    Cancer ([place])    PROSTATE   History of colonic polyps    History of kidney stones    Hypertension    Inguinal hernia 03/2002   Pneumonia    "years ago"    SURGICAL HISTORY: Past Surgical History:  Procedure Laterality Date   CATARACT EXTRACTION Bilateral    CHOLECYSTECTOMY N/A 12/14/2020   Procedure: LAPAROSCOPIC CHOLECYSTECTOMY;  Surgeon: Stark Klein, MD;  Location: [place];   Service: General;  Laterality: N/A;   COLONOSCOPY     DIRECT LARYNGOSCOPY N/A 07/08/2018   Procedure: DIRECT LARYNGOSCOPY;  Surgeon: Leta Baptist, MD;  Location: [place];  Service: ENT;  Laterality: N/A;   ESOPHAGOSCOPY N/A 07/08/2018   Procedure: ESOPHAGOSCOPY;  Surgeon: Leta Baptist, MD;  Location: [place];  Service: ENT;  Laterality: N/A;   [place] N/A 07/08/2018   Procedure: FLEXIBLE BRONCHOSCOPY;  Surgeon: Leta Baptist, MD;  Location: [place];  Service: ENT;  Laterality: N/A;   HERNIA REPAIR Left    inguinal hernia   INGUINAL HERNIA REPAIR Right 09/03/2020   Procedure: OPEN RIGHT INGUINAL HERNIA REPAIR;  Surgeon: Clovis Riley, MD;  Location: [facility];  Service: General;  Laterality: Right;   IR IMAGING GUIDED PORT INSERTION  07/30/2018   MASS BIOPSY Right 07/08/2018   Procedure: RIGHT NECK MASS EXCISIONAL BIOPSY;  Surgeon: Leta Baptist, MD;  Location: [facility];  Service: ENT;  Laterality: Right;   PATELLA FRACTURE SURGERY Left    PROSTATE SURGERY  10/2005   RADIAL PROSTATECTOMY   ROTATOR CUFF REPAIR Bilateral     SOCIAL HISTORY: Social History   Socioeconomic History   Marital status: Married    Spouse name: Not on file   Number of children: Not on file   Years of education: Not on file   Highest education level: Not on file  Occupational History   Not on file  Tobacco Use   Smoking status: Former   Smokeless tobacco: Never   Tobacco comments:    stopped at the age of  41  Vaping Use   Vaping Use: Never used  Substance and Sexual Activity   Alcohol use: Not Currently    Alcohol/week: 6.0 standard drinks    Types: 6 Standard drinks or equivalent per week   Drug use: No   Sexual activity: Not Currently  Other Topics Concern   Not on file  Social History Narrative   Not on file   Social Determinants of Health   Financial Resource Strain: Not on file  Food Insecurity: Not on file  Transportation Needs: Not on file  Physical Activity: Not on file   Stress: Not on file  Social Connections: Not on file  Intimate Partner Violence: Not on file    FAMILY HISTORY: Family History  Problem Relation Age of Onset   Cancer Father    Cancer Brother     ALLERGIES:  is allergic to lisinopril and penicillins.  MEDICATIONS:  Current Outpatient Medications  Medication Sig Dispense Refill   azithromycin (ZITHROMAX) 250 MG tablet Take 2 tablets PO on day one, and one tablet PO daily thereafter until completed. 6 tablet 0   ibrutinib 280 MG TABS Take 1 tablet (280 mg) by mouth daily. Administer with a glass of water at approximately the same time each day. Swallow capsules or tablets whole. Do not open, break, or chew capsules; do not cut, crush, or chew tablets. Maintain adequate hydration during treatment. 30 tablet 2   latanoprost (XALATAN) 0.005 % ophthalmic solution Place 1 drop into both eyes at bedtime.     losartan-hydrochlorothiazide (HYZAAR) 50-12.5 MG tablet TAKE 1 TABLET BY MOUTH EVERY DAY 90 tablet 1   Multiple Vitamins-Minerals (ICAPS AREDS 2 PO) Take 1 tablet by mouth daily.     oxyCODONE (OXY IR/ROXICODONE) 5 MG immediate release tablet Take 0.5-1 tablets (2.5-5 mg total) by mouth every 6 (six) hours as needed for severe pain. 8 tablet 0   pravastatin (PRAVACHOL) 40 MG tablet Take 1 tablet by mouth once daily 90 tablet 1   No current facility-administered medications for this visit.    REVIEW OF SYSTEMS:   10 Point review of Systems was done is negative except as noted above.  PHYSICAL EXAMINATION: Telemedicine visit   LABORATORY DATA:  I have reviewed the data as listed  CBC Latest Ref Rng & Units 04/25/2021 02/25/2021 01/24/2021  WBC 4.0 - 10.5 K/uL 8.8 5.9 3.3(L)  Hemoglobin 13.0 - 17.0 g/dL 11.8(L) 12.1(L) 11.5(L)  Hematocrit 39.0 - 52.0 % 37.2(L) 36.4(L) 34.4(L)  Platelets 150 - 400 K/uL 114(L) 101(L) 148(L)    . CMP Latest Ref Rng & Units 04/25/2021 02/25/2021 01/24/2021  Glucose 70 - 99 mg/dL 93 86 108(H)  BUN 8 -  23 mg/dL _0 Creatinine 0.61 - 1.24 mg/dL 0.89 0.81 0.76  Sodium 135 - 145 mmol/L 137 138 140  Potassium 3.5 - 5.1 mmol/L 3.9 3.7 3.4(L)  Chloride 98 - 111 mmol/L 100 102 103  CO2 22 - 32 mmol/L _1 Calcium 8.9 - 10.3 mg/dL 9.0 8.7(L) 8.7(L)  Total Protein 6.5 - 8.1 g/dL 6.8 6.9 6.8  Total Bilirubin 0.3 - 1.2 mg/dL 0.5 0.5 0.5  Alkaline Phos 38 - 126 U/L 110 103 115  AST 15 - 41 U/L _2 ALT 0 - 44 U/L _3 . Lab Results  Component Value Date   LDH 195 (H) 04/25/2021     02/23/2020 Right Cervical Lymph Node Surgical Pathology [phone]):    02/23/2020 FISH Analysis:   02/23/2020 FISH Mutation Analysis:    03/03/2019  NM PET Image Restag (PS) Skull Base To Thigh (Accession [id number]):   07/08/18 Right Cervical Tissue Biopsy:    RADIOGRAPHIC STUDIES: I have personally reviewed the radiological images as listed and agreed with the findings in the report. NM PET Image Restag (PS) Skull Base To Thigh  Result Date: 05/04/2021 CLINICAL DATA:  Subsequent treatment strategy for mantle cell lymphoma. Additional history of gallbladder and biliary cancer. Last chemotherapy 03/29/2021. Cholecystectomy 12/14/2020. EXAM: NUCLEAR MEDICINE PET SKULL BASE TO THIGH TECHNIQUE: 7.7 mCi F-18 FDG was injected intravenously. Full-ring PET imaging was performed from the skull base to thigh after the radiotracer. CT data was obtained and used for attenuation correction and anatomic localization. Fasting blood glucose: 88 mg/dl COMPARISON:  None. FINDINGS: Mediastinal blood pool activity: SUV max 1.59 Liver activity: SUV max NA NECK: New bilateral hypermetabolic cervical lymph nodes. Example cluster of level 2 lymph nodes on the RIGHT with SUV max equal 13.4. Nodes involved level 2 and level 3 nodal stations Incidental CT findings: none CHEST: Progression of bilateral axillary lymph nodes. Lymph nodes increased in size and number compared to prior. Example node in the LEFT  axilla measures 10 mm increased from 5 mm with SUV max equal 14.5 increased from SUV max equal 7.3 (image 52) Similar increase in size and metabolic activity of RIGHT axillary nodes with SUV max equal 12.7 increased from 5.0 Increased hypermetabolic mediastinal hilar nodes. Example subcarinal node measures 12 mm short axis increased from 5 mm with SUV max equal 12.2 compared to 4.8. Incidental CT findings: No suspicious pulmonary nodules. Port in the anterior chest wall with tip in distal SVC. ABDOMEN/PELVIS: Dramatic increase in metabolic activity of the spleen with SUV max equal 9.5 increased from normal activity. Spleen is also mildly increased in size. There is new hypermetabolic periportal lymph nodes and periaortic retroperitoneal nodes the upper abdomen. Peripherally hypermetabolic lesion in the liver SUV max equal 8.7. This presumably same smaller lesion adjacent the gallbladder fossa on comparison exam Extensive hypermetabolic adenopathy extends along the aorta into the iliac chains. LEFT external iliac nodes are similar prior with SUV max equal 15.5 increased from 11.3. There is increase in hypermetabolic inguinal nodes which are intensely hypermetabolic Incidental CT findings: none SKELETON: No focal hypermetabolic activity to suggest skeletal metastasis. Incidental CT findings: none IMPRESSION: 1. Marked progression lymphoma compared FDG PET scan 10/01/2020. 2. New increased cervical adenopathy, axillary adenopathy mediastinal adenopathy. 3. New hypermetabolic Lymphoma involvement of the spleen. 4. Interval increase in size of solitary hypermetabolic hepatic lesion 5. Progression of hypermetabolic paraaortic retroperitoneal adenopathy, iliac adenopathy and inguinal adenopathy. Electronically Signed   By: Suzy Bouchard M.D.   On: 05/04/2021 16:55    ASSESSMENT & PLAN:   88 y.o. male with  1. H/o Diffuse Large B-Cell Lymphoma, Stage IV- now in remission Presenting without constitutional  symptoms. Palpable right cervical and supraclavicular lymphadenopathy.  07/08/18 Right cervical soft tissue biopsy revealed Diffuse Large B-Cell Lymphoma, germinal center type  06/05/18 CT Neck revealed Pathologic RIGHT-sided level II, III, IV, and V lymphadenopathy, most consistent with metastatic carcinoma. An obvious primary source is not identified. Tissue sampling is warranted. 07/16/18 Hep B and Hep C negative 07/17/18 ECHO revealed LV EF of 60-65% 07/22/18 PET/CT revealed "Hypermetabolic adenopathy especially concentrated in the right neck but also in the left neck, chest, abdomen, and pelvis. The adenopathy is primarily Deauville 5 although some few scattered smaller lesions are Deauville 4. 2. Diffuse abnormal splenic activity, Deauville 5, without overt splenomegaly. 3. There is  also hypermetabolic Deauville 5 activity in the mildly thickened distal appendix, and raising suspicion for involvement of the appendix. 4. Other imaging findings of potential clinical significance: Aortic Atherosclerosis. Coronary atherosclerosis." 10/03/2018 PET scan revealed "Interval response to therapy with stable to decreased size of lymph nodes on CT and generalized decrease in hypermetabolism of the abnormal nodes. Hypermetabolism in the lymph nodes today is compatible with a combination of Deauville 3 and Deauville 4 disease. Stable tiny bilateral pulmonary nodules. Increase radiotracer accumulation in the marrow space on today's study, presumably representing marrow stimulatory effects of therapy."  2. Currently with new Non-Hodgkin's B-cell Lymphoma - consistent with Mantle Cell 02/23/2020 Right Cervical Lymph Node Surgical Pathology [phone]) which revealed "Non-Hodgkin B-cell lymphoma. Ki-67 generally shows low expression (<5-15%)." 02/23/2020 FISH Mutation Analysis which revealed "t(11;14): DETECTED".  3.  History of gallbladder adenocarcinoma status postcholecystectomy on 12/29/2020 -Pathology from his  recent gallbladder surgery showed GALLBLADDER, CHOLECYSTECTOMY:  - Adenocarcinoma, 2.4 cm maximally, with perforation of serosa. Negative for carcinoma in one lymph node. Cholelithiasis.  CA 19-9 tumor marker continues to gradually increase which is concerning. No new abdominal symptoms.  Normal liver function tests. PLAN -Discussed patient's PET/CT scan results in detail noted above. -Continue high risk broad progression/tumor related reaction from his mantle cell lymphoma at this time.  We shall increase his ibrutinib to the standard dose of 420 mg p.o. daily since he is tolerating the to 80 mg p.o. daily without any overt toxicities. -Might need to consider adding venetoclax to the ibrutinib if inadequate response.  -Patient has progression of single hypermetabolic hepatic lesion likely from the patient's gallbladder adenocarcinoma in the context of increasing CA 19-9 levels. -We will refer the patient to radiation oncology to consider IMRT to isolated liver metastasis from gallbladder carcinoma  FOLLOW UP: -Increased dose to Ibruitnib to 417m po daily -Referral to radiation oncology for consideration of ISRT to isolated liver metastases from adenocarcinoma -Return to clinic with Dr. KIrene Limboin 3 weeks with labs   All of the patient's questions were answered with apparent satisfaction. The patient knows to call the clinic with any problems, questions or concerns.   GSullivan LoneMD [id number] SGarfield Medical CenterCAurora St Lukes Medical CenterHematology/Oncology Physician CBrentwood Surgery Center LLC

## 2021-05-11 ENCOUNTER — Telehealth: Payer: Self-pay | Admitting: Hematology

## 2021-05-11 NOTE — Telephone Encounter (Signed)
Scheduled follow-up appointment per 2/14 los. Patient is aware.

## 2021-05-12 ENCOUNTER — Telehealth: Payer: Medicare Other

## 2021-05-16 ENCOUNTER — Encounter: Payer: Self-pay | Admitting: Hematology

## 2021-05-18 NOTE — Progress Notes (Signed)
GI Location of Tumor / Histology: Large B Cell Lymphoma, Stage IV (06/2018), Gallbladder Adenocarcinoma (12/2020), Liver mets  Mario Garcia was diagnosed with B Cell Lymphoma in April 2020.    PET 05/04/2021: Marked progression lymphoma compared FDG PET scan 10/01/2020.  New increased cervical adenopathy, axillary adenopathy mediastinal adenopathy.  New hypermetabolic Lymphoma involvement of the spleen.  Interval increase in size of solitary hypermetabolic hepatic lesion  MR Liver 10/26/2020: As noted on prior examination, there is marked, focal wall thickening at the gallbladder fundus, measuring approximately 1.7 cm with associated heterogeneous contrast enhancement. This finding is concerning for gallbladder malignancy given increased over time and previously associated FDG avidity.  No definite liver lesion to correspond to suspected FDG avidity within the anterior right lobe of the liver adjacent to the gallbladder fundus. Upon review of prior PET-CT examination, this appears to have actually reflected FDG avidity of the gallbladder fundus misregistered to the liver due to breath motion artifact.  Biopsies of Gallbladder Mass 12/14/2020    Past/Anticipated interventions by surgeon, if any:  Dr. Barry Dienes -Laparoscopic Cholecystectomy 12/14/2020   Past/Anticipated interventions by medical oncology, if any:  Dr. Irene Limbo 05/10/2021 -Discussed patient's PET/CT scan results in detail noted above. -Continue high risk broad progression/tumor related reaction from his mantle cell lymphoma at this time.  We shall increase his ibrutinib to the standard dose of 420 mg p.o. daily since he is tolerating the to 80 mg p.o. daily without any overt toxicities. -Might need to consider adding venetoclax to the ibrutinib if inadequate response. -Patient has progression of single hypermetabolic hepatic lesion likely from the patient's gallbladder adenocarcinoma in the context of increasing CA 19-9 levels. -We will  refer the patient to radiation oncology to consider IMRT to isolated liver metastasis from gallbladder carcinoma    Weight changes, if any: No  Bowel/Bladder complaints, if any: Has noted some constipation.  Nausea / Vomiting, if any: No  Pain issues, if any: Intermittent upper abdominal pain, no specific time of day. Usually relieved with antacids.   SAFETY ISSUES: Prior radiation? No Pacemaker/ICD? No Possible current pregnancy? N/a Is the patient on methotrexate? No  Current Complaints/Details: [name]

## 2021-05-19 ENCOUNTER — Encounter: Payer: Self-pay | Admitting: Radiation Oncology

## 2021-05-19 ENCOUNTER — Ambulatory Visit
Admission: RE | Admit: 2021-05-19 | Discharge: 2021-05-19 | Disposition: A | Discharge status: Home / Self Care | Payer: Medicare Other | Source: Ambulatory Visit | Attending: Radiation Oncology | Admitting: Radiation Oncology

## 2021-05-19 ENCOUNTER — Other Ambulatory Visit: Payer: Self-pay

## 2021-05-19 VITALS — BP 137/66 | HR 68 | Temp 97.7°F | Resp 20 | Ht 69.0 in | Wt 153.0 lb

## 2021-05-19 DIAGNOSIS — C23 Malignant neoplasm of gallbladder: Secondary | ICD-10-CM | POA: Diagnosis not present

## 2021-05-19 DIAGNOSIS — C787 Secondary malignant neoplasm of liver and intrahepatic bile duct: Secondary | ICD-10-CM | POA: Diagnosis not present

## 2021-05-19 DIAGNOSIS — C8331 Diffuse large B-cell lymphoma, lymph nodes of head, face, and neck: Secondary | ICD-10-CM

## 2021-05-19 NOTE — Progress Notes (Signed)
Radiation Oncology         ([phone]) [phone] ________________________________  Name: Mario Garcia        MRN: [id number]  Date of Service: 05/19/2021 DOB: 07/10/1931  WT:[id number], Elyse Jarvis, MD  Brunetta Genera, MD     REFERRING PHYSICIAN: Brunetta Genera, MD   DIAGNOSIS: The primary encounter diagnosis was Diffuse large B-cell lymphoma of lymph nodes of neck ([place]). Diagnoses of Secondary malignant neoplasm of liver ([place]) and Gallbladder cancer ([place]) were also pertinent to this visit.   HISTORY OF PRESENT ILLNESS: Mario Garcia is a 88 y.o. male seen at the request of Dr. 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 He was originally diagnosed with lymphoma in 2019. He did undergo excisional biopsy of the right neck mass in April 2020 and received adjuvant Cytoxan/Adriamycin/Rituxan between 08/05/2018 and 11/18/2018. He started Bendamustine  and Ruxience between 03/25/20 and 07/21/20. Ruxience was administered on 10/18/20, 12/27/20, 02/25/21, and 04/25/21 and he takes Ibrutinib.  During the follow up of his lymphoma, in the summer of 2022 he was found to have a PET positive mass in the left segment of the liver measuring 1.2 cm, thickening of the gallbladder measuring 2.2 cm, and MRI did not demonstrate a lesion in the liver. He underwent laparoscopic cholecystectomy on 12/14/20 with Dr. Barry Dienes and this revealed a 2.4 cm adenocarcinoma with perforation of serosa; margins were negative, and one node was negative. CA 19-9 then was 71. It has trended upwards and on 04/25/21 was 139.  Recent PET on 05/04/21 showed marked progression of his lymphoma with cervical, axillary and mediastinal adenopathy as well as lymphomatous changes of the spleen and an increase in a lesion in the liver that has been followed now with an SUV of 8.7.  Additional progression of hypermetabolic paraaortic, iliac, and inguinal adenopathy was noted. Plans are  to increase his oral ibrutinib dose, and he is seen to discuss options of radiotherapy for his liver lesion.   PET 05/04/21   PREVIOUS RADIATION THERAPY: No   PAST MEDICAL HISTORY:  Past Medical History:  Diagnosis Date   Arthritis    Cancer ([place])    PROSTATE   History of colonic polyps    History of kidney stones    Hypertension    Inguinal hernia 03/2002   Pneumonia    "years ago"       PAST SURGICAL HISTORY: Past Surgical History:  Procedure Laterality Date   CATARACT EXTRACTION Bilateral    CHOLECYSTECTOMY N/A 12/14/2020   Procedure: LAPAROSCOPIC CHOLECYSTECTOMY;  Surgeon: Stark Klein, MD;  Location: [facility];  Service: General;  Laterality: N/A;   COLONOSCOPY     DIRECT LARYNGOSCOPY N/A 07/08/2018   Procedure: DIRECT LARYNGOSCOPY;  Surgeon: Leta Baptist, MD;  Location: [facility];  Service: ENT;  Laterality: N/A;   ESOPHAGOSCOPY N/A 07/08/2018   Procedure: ESOPHAGOSCOPY;  Surgeon: Leta Baptist, MD;  Location: [facility];  Service: ENT;  Laterality: N/A;   EYE SURGERY     FLEXIBLE BRONCHOSCOPY N/A 07/08/2018   Procedure: FLEXIBLE BRONCHOSCOPY;  Surgeon: Leta Baptist, MD;  Location: [facility];  Service: ENT;  Laterality: N/A;   HERNIA REPAIR Left    inguinal hernia   INGUINAL HERNIA REPAIR Right 09/03/2020   Procedure: OPEN RIGHT INGUINAL HERNIA REPAIR;  Surgeon: Clovis Riley, MD;  Location: [facility];  Service: General;  Laterality: Right;   IR IMAGING GUIDED PORT INSERTION  07/30/2018   MASS BIOPSY Right 07/08/2018   Procedure: RIGHT NECK MASS EXCISIONAL BIOPSY;  Surgeon: Leta Baptist, MD;  Location: [facility];  Service: ENT;  Laterality: Right;   PATELLA FRACTURE SURGERY Left    PROSTATE SURGERY  10/2005   RADIAL PROSTATECTOMY   ROTATOR CUFF REPAIR Bilateral      FAMILY HISTORY:  Family History  Problem Relation Age of Onset   Cancer Father    Cancer Brother      SOCIAL HISTORY:  reports that he has quit smoking. He has never used smokeless tobacco. He reports current alcohol use of  about 6.0 standard drinks per week. He reports that he does not use drugs. The patient is married and lives in [place]. He is retired from working for a [employer]. He is very active and enjoys spending time with family.   ALLERGIES: Lisinopril and Penicillins   MEDICATIONS:  Current Outpatient Medications  Medication Sig Dispense Refill   azithromycin (ZITHROMAX) 250 MG tablet Take 2 tablets PO on day one, and one tablet PO daily thereafter until completed. 6 tablet 0   ibrutinib 280 MG TABS Take 1 tablet (280 mg) by mouth daily. Administer with a glass of water at approximately the same time each day. Swallow capsules or tablets whole. Do not open, break, or chew capsules; do not cut, crush, or chew tablets. Maintain adequate hydration during treatment. 30 tablet 2   latanoprost (XALATAN) 0.005 % ophthalmic solution Place 1 drop into both eyes at bedtime.     losartan-hydrochlorothiazide (HYZAAR) 50-12.5 MG tablet TAKE 1 TABLET BY MOUTH EVERY DAY 90 tablet 1   Multiple Vitamins-Minerals (ICAPS AREDS 2 PO) Take 1 tablet by mouth daily.     pravastatin (PRAVACHOL) 40 MG tablet Take 1 tablet by mouth once daily 90 tablet 1   No current facility-administered medications for this encounter.     REVIEW OF SYSTEMS: On review of systems, the patient reports that he is doing okay. He has been using tums and liquid antacids for what sounds like reflux that he notes in his epigastric region. He has not found these measures to be very helpful. He describes a bit of a gnawing sensation in this area. No emesis or nausea otherwise is described. He is very tired and fatigued. No other complaints are verbalized.      PHYSICAL EXAM:  Wt Readings from Last 3 Encounters:  05/19/21 153 lb (69.4 kg)  04/25/21 155 lb 4.8 oz (70.4 kg)  02/25/21 152 lb 9.6 oz (69.2 kg)   Temp Readings from Last 3 Encounters:  05/19/21 97.7 F (36.5 C)  04/25/21 98.2 F (36.8 C) (Oral)  04/25/21 97.8 F (36.6  C) (Temporal)   BP Readings from Last 3 Encounters:  05/19/21 137/66  04/25/21 (!) 144/49  04/25/21 (!) 133/59   Pulse Readings from Last 3 Encounters:  05/19/21 68  04/25/21 67  04/25/21 70   Pain Assessment Pain Score: 3 /10  In general this is a well appearing caucasian male who appears younger than stated age, in no acute distress. He's alert and oriented x4 and appropriate throughout the examination. Cardiopulmonary assessment is negative for acute distress and he exhibits normal effort.     ECOG = 1  0 - Asymptomatic (Fully active, able to carry on all predisease activities without restriction)  1 - Symptomatic but completely ambulatory (Restricted in physically strenuous activity but ambulatory and able to carry out work of a light or sedentary nature. For example, light  housework, office work)  2 - Symptomatic, <50% in bed during the day (Ambulatory and capable of all self care but unable to carry out any work activities. Up and about more than 50% of waking hours)  3 - Symptomatic, >50% in bed, but not bedbound (Capable of only limited self-care, confined to bed or chair 50% or more of waking hours)  4 - Bedbound (Completely disabled. Cannot carry on any self-care. Totally confined to bed or chair)  5 - Death   Eustace Pen MM, Creech RH, Tormey DC, et al. [phone]). "Toxicity and response criteria of the [facility] Group". [place] Oncol. 5 (6): 649-55    LABORATORY DATA:  Lab Results  Component Value Date   WBC 8.8 04/25/2021   HGB 11.8 (L) 04/25/2021   HCT 37.2 (L) 04/25/2021   MCV 90.3 04/25/2021   PLT 114 (L) 04/25/2021   Lab Results  Component Value Date   NA 137 04/25/2021   K 3.9 04/25/2021   CL 100 04/25/2021   CO2 29 04/25/2021   Lab Results  Component Value Date   ALT 12 04/25/2021   AST 21 04/25/2021   ALKPHOS 110 04/25/2021   BILITOT 0.5 04/25/2021      RADIOGRAPHY: NM PET Image Restag (PS) Skull Base To Thigh  Result  Date: 05/04/2021 CLINICAL DATA:  Subsequent treatment strategy for mantle cell lymphoma. Additional history of gallbladder and biliary cancer. Last chemotherapy 03/29/2021. Cholecystectomy 12/14/2020. EXAM: NUCLEAR MEDICINE PET SKULL BASE TO THIGH TECHNIQUE: 7.7 mCi F-18 FDG was injected intravenously. Full-ring PET imaging was performed from the skull base to thigh after the radiotracer. CT data was obtained and used for attenuation correction and anatomic localization. Fasting blood glucose: 88 mg/dl COMPARISON:  None. FINDINGS: Mediastinal blood pool activity: SUV max 1.59 Liver activity: SUV max NA NECK: New bilateral hypermetabolic cervical lymph nodes. Example cluster of level 2 lymph nodes on the RIGHT with SUV max equal 13.4. Nodes involved level 2 and level 3 nodal stations Incidental CT findings: none CHEST: Progression of bilateral axillary lymph nodes. Lymph nodes increased in size and number compared to prior. Example node in the LEFT axilla measures 10 mm increased from 5 mm with SUV max equal 14.5 increased from SUV max equal 7.3 (image 52) Similar increase in size and metabolic activity of RIGHT axillary nodes with SUV max equal 12.7 increased from 5.0 Increased hypermetabolic mediastinal hilar nodes. Example subcarinal node measures 12 mm short axis increased from 5 mm with SUV max equal 12.2 compared to 4.8. Incidental CT findings: No suspicious pulmonary nodules. Port in the anterior chest wall with tip in distal SVC. ABDOMEN/PELVIS: Dramatic increase in metabolic activity of the spleen with SUV max equal 9.5 increased from normal activity. Spleen is also mildly increased in size. There is new hypermetabolic periportal lymph nodes and periaortic retroperitoneal nodes the upper abdomen. Peripherally hypermetabolic lesion in the liver SUV max equal 8.7. This presumably same smaller lesion adjacent the gallbladder fossa on comparison exam Extensive hypermetabolic adenopathy extends along the aorta  into the iliac chains. LEFT external iliac nodes are similar prior with SUV max equal 15.5 increased from 11.3. There is increase in hypermetabolic inguinal nodes which are intensely hypermetabolic Incidental CT findings: none SKELETON: No focal hypermetabolic activity to suggest skeletal metastasis. Incidental CT findings: none IMPRESSION: 1. Marked progression lymphoma compared FDG PET scan 10/01/2020. 2. New increased cervical adenopathy, axillary adenopathy mediastinal adenopathy. 3. New hypermetabolic Lymphoma involvement of the spleen. 4. Interval increase in  size of solitary hypermetabolic hepatic lesion 5. Progression of hypermetabolic paraaortic retroperitoneal adenopathy, iliac adenopathy and inguinal adenopathy. Electronically Signed   By: Suzy Bouchard M.D.   On: 05/04/2021 16:55       IMPRESSION/PLAN: 1. Progressive Metastatic Cholangiocarcinoma of the gallbladder with liver metastasis. Dr. Lisbeth Renshaw discusses the pathology findings and reviews the nature of metastatic liver disease. Dr. Lisbeth Renshaw recommends an MRI of the liver to better characterize the PET scan finding, but anticipates a role for stereotactic body radiotherapy (SBRT).  We discussed the risks, benefits, short, and long term effects of radiotherapy, as well as the curative intent, and the patient is interested in proceeding. Dr. Lisbeth Renshaw discusses the delivery and logistics of radiotherapy and anticipates a course of 5-10 fractions of SBRT style or possible Ultrahyopfractionated radiotherapy. Written consent is obtained and placed in the chart, a copy was provided to the patient. He will be contacted to coordinate MRI and then once we know that date, coordinate simulation by our staff. 2. Non Hodgkin's Lymphoma. He will continue to follow up with Dr. Irene Limbo regarding his disease and continue Ibrutinib.   In a visit lasting 60 minutes, greater than 50% of the time was spent face to face discussing the patient's condition, in preparation for  the discussion, and coordinating the patient's care.   The above documentation reflects my direct findings during this shared patient visit. Please see the separate note by Dr. Lisbeth Renshaw on this date for the remainder of the patient's plan of care.    Carola Rhine, [facility]   **Disclaimer: This note was dictated with voice recognition software. Similar sounding words can inadvertently be transcribed and this note may contain transcription errors which may not have been corrected upon publication of note.**

## 2021-05-20 ENCOUNTER — Other Ambulatory Visit: Payer: Self-pay

## 2021-05-20 DIAGNOSIS — C8331 Diffuse large B-cell lymphoma, lymph nodes of head, face, and neck: Secondary | ICD-10-CM

## 2021-05-20 MED ORDER — IBRUTINIB 280 MG PO TABS: 280.0000 mg | ORAL_TABLET | Freq: Every day | ORAL | 2 refills | Status: DC

## 2021-05-23 ENCOUNTER — Telehealth: Payer: Self-pay | Admitting: *Deleted

## 2021-05-23 DIAGNOSIS — H40011 Open angle with borderline findings, low risk, right eye: Secondary | ICD-10-CM | POA: Diagnosis not present

## 2021-05-23 DIAGNOSIS — H35363 Drusen (degenerative) of macula, bilateral: Secondary | ICD-10-CM | POA: Diagnosis not present

## 2021-05-23 DIAGNOSIS — H26493 Other secondary cataract, bilateral: Secondary | ICD-10-CM | POA: Diagnosis not present

## 2021-05-23 DIAGNOSIS — H401122 Primary open-angle glaucoma, left eye, moderate stage: Secondary | ICD-10-CM | POA: Diagnosis not present

## 2021-05-23 LAB — HM DIABETES EYE EXAM

## 2021-05-23 NOTE — Telephone Encounter (Signed)
CALLED PATIENT TO INFORM OF MRI FOR 05-25-21 - ARRIVAL TIME- 2:30 PM @ [facility], PATIENT TO BE NPO- 4 HRS. PRIOR TO TEST AND ALISON PERKINS TO CALL HIM WITH THE RESULTS, SPOKE WITH PATIENT AND HE IS AWARE OF THIS TEST

## 2021-05-25 ENCOUNTER — Ambulatory Visit (HOSPITAL_COMMUNITY)
Admission: RE | Admit: 2021-05-25 | Discharge: 2021-05-25 | Disposition: A | Discharge status: Home / Self Care | Payer: Medicare Other | Source: Ambulatory Visit | Attending: Radiation Oncology | Admitting: Radiation Oncology

## 2021-05-25 ENCOUNTER — Other Ambulatory Visit: Payer: Self-pay

## 2021-05-25 DIAGNOSIS — C787 Secondary malignant neoplasm of liver and intrahepatic bile duct: Secondary | ICD-10-CM | POA: Insufficient documentation

## 2021-05-25 DIAGNOSIS — C23 Malignant neoplasm of gallbladder: Secondary | ICD-10-CM | POA: Insufficient documentation

## 2021-05-25 MED ORDER — GADOBUTROL 1 MMOL/ML IV SOLN
7.0000 mL | Freq: Once | INTRAVENOUS | Status: AC | PRN
  Administered 2021-05-25: 7 mL via INTRAVENOUS

## 2021-05-26 ENCOUNTER — Telehealth: Payer: Self-pay | Admitting: Family Medicine

## 2021-05-26 NOTE — Chronic Care Management (AMB) (Signed)
?  Chronic Care Management  ? ?Note ? ?05/26/2021 ?Name: Mario Garcia MRN: [id number] DOB: 1931/06/13 ? ?Mario Garcia is a 88 y.o. year old male who is a primary care patient of Mario Garcia, Mario Jarvis, Mario Garcia. I reached out to Mario Garcia by phone today in response to a referral sent by Mario Garcia's PCP, Mario Lung, Mario Garcia.  ? ?[name] was given information about Chronic Care Management services today including:  ?CCM service includes personalized support from designated clinical staff supervised by his physician, including individualized plan of care and coordination with other care providers ?24/7 contact phone numbers for assistance for urgent and routine care needs. ?Service will only be billed when office clinical staff spend 20 minutes or more in a month to coordinate care. ?Only one practitioner may furnish and bill the service in a calendar month. ?The patient may stop CCM services at any time (effective at the end of the month) by phone call to the office staff. ? ? ?Patient wishes to consider information provided and/or speak with a member of the care team before deciding about enrollment in care management services.  ? ?Follow up plan: CALLED PT TO RESCHEDULED CANCELLED APT HE DID NOT WISH TO DO SO AT THIS TIME ? ? ?Tatjana Dellinger ?[facility]stream Scheduler  ?

## 2021-05-31 ENCOUNTER — Telehealth: Payer: Self-pay | Admitting: Pharmacist

## 2021-05-31 ENCOUNTER — Other Ambulatory Visit (HOSPITAL_COMMUNITY): Payer: Self-pay

## 2021-05-31 ENCOUNTER — Telehealth: Payer: Self-pay

## 2021-05-31 ENCOUNTER — Telehealth: Payer: Self-pay | Admitting: Hematology

## 2021-05-31 ENCOUNTER — Other Ambulatory Visit: Payer: Self-pay

## 2021-05-31 ENCOUNTER — Inpatient Hospital Stay: Payer: Medicare Other | Attending: Hematology | Admitting: Hematology

## 2021-05-31 ENCOUNTER — Inpatient Hospital Stay: Payer: Medicare Other

## 2021-05-31 VITALS — BP 125/50 | HR 68 | Temp 97.2°F | Resp 18 | Wt 152.6 lb

## 2021-05-31 DIAGNOSIS — C23 Malignant neoplasm of gallbladder: Secondary | ICD-10-CM | POA: Insufficient documentation

## 2021-05-31 DIAGNOSIS — C787 Secondary malignant neoplasm of liver and intrahepatic bile duct: Secondary | ICD-10-CM | POA: Diagnosis not present

## 2021-05-31 DIAGNOSIS — Z87891 Personal history of nicotine dependence: Secondary | ICD-10-CM | POA: Insufficient documentation

## 2021-05-31 DIAGNOSIS — C8318 Mantle cell lymphoma, lymph nodes of multiple sites: Secondary | ICD-10-CM

## 2021-05-31 DIAGNOSIS — C8310 Mantle cell lymphoma, unspecified site: Secondary | ICD-10-CM | POA: Insufficient documentation

## 2021-05-31 DIAGNOSIS — Z95828 Presence of other vascular implants and grafts: Secondary | ICD-10-CM

## 2021-05-31 LAB — CBC WITH DIFFERENTIAL/PLATELET
Abs Immature Granulocytes: 0.13 10*3/uL — ABNORMAL HIGH (ref 0.00–0.07)
Basophils Absolute: 0 10*3/uL (ref 0.0–0.1)
Basophils Relative: 0 %
Eosinophils Absolute: 0 10*3/uL (ref 0.0–0.5)
Eosinophils Relative: 0 %
HCT: 34.4 % — ABNORMAL LOW (ref 39.0–52.0)
Hemoglobin: 11.1 g/dL — ABNORMAL LOW (ref 13.0–17.0)
Immature Granulocytes: 2 %
Lymphocytes Relative: 39 %
Lymphs Abs: 3.1 10*3/uL (ref 0.7–4.0)
MCH: 28.8 pg (ref 26.0–34.0)
MCHC: 32.3 g/dL (ref 30.0–36.0)
MCV: 89.4 fL (ref 80.0–100.0)
Monocytes Absolute: 0.9 10*3/uL (ref 0.1–1.0)
Monocytes Relative: 11 %
Neutro Abs: 3.7 10*3/uL (ref 1.7–7.7)
Neutrophils Relative %: 48 %
Platelets: 109 10*3/uL — ABNORMAL LOW (ref 150–400)
RBC: 3.85 MIL/uL — ABNORMAL LOW (ref 4.22–5.81)
RDW: 13 % (ref 11.5–15.5)
WBC: 7.8 10*3/uL (ref 4.0–10.5)
nRBC: 0 % (ref 0.0–0.2)

## 2021-05-31 LAB — CMP (CANCER CENTER ONLY)
ALT: 13 U/L (ref 0–44)
AST: 22 U/L (ref 15–41)
Albumin: 3.6 g/dL (ref 3.5–5.0)
Alkaline Phosphatase: 120 U/L (ref 38–126)
Anion gap: 9 (ref 5–15)
BUN: 22 mg/dL (ref 8–23)
CO2: 27 mmol/L (ref 22–32)
Calcium: 8.6 mg/dL — ABNORMAL LOW (ref 8.9–10.3)
Chloride: 101 mmol/L (ref 98–111)
Creatinine: 0.94 mg/dL (ref 0.61–1.24)
GFR, Estimated: >60 mL/min (ref >60)
Glucose, Bld: 134 mg/dL — ABNORMAL HIGH (ref 70–99)
Potassium: 3.9 mmol/L (ref 3.5–5.1)
Sodium: 137 mmol/L (ref 135–145)
Total Bilirubin: 0.5 mg/dL (ref 0.3–1.2)
Total Protein: 6.2 g/dL — ABNORMAL LOW (ref 6.5–8.1)

## 2021-05-31 LAB — LACTATE DEHYDROGENASE: LDH: 212 U/L — ABNORMAL HIGH (ref 98–192)

## 2021-05-31 MED ORDER — ACALABRUTINIB 100 MG PO CAPS
100.0000 mg | ORAL_CAPSULE | Freq: Two times a day (BID) | ORAL | 1 refills | Status: DC
  Filled 2021-05-31: qty 60, 30d supply, fill #0

## 2021-05-31 MED ORDER — SODIUM CHLORIDE 0.9% FLUSH
10.0000 mL | INTRAVENOUS | Status: DC | PRN
  Administered 2021-05-31: 10 mL

## 2021-05-31 MED ORDER — HEPARIN SOD (PORK) LOCK FLUSH 100 UNIT/ML IV SOLN
500.0000 [IU] | Freq: Once | INTRAVENOUS | Status: AC | PRN
  Administered 2021-05-31: 500 [IU]

## 2021-05-31 MED ORDER — CALQUENCE 100 MG PO TABS
100.0000 mg | ORAL_TABLET | Freq: Two times a day (BID) | ORAL | 1 refills | Status: DC
Start: 2021-05-31 — End: 2021-07-14
  Filled 2021-05-31: qty 60, fill #0

## 2021-05-31 NOTE — Telephone Encounter (Signed)
Patient is approved for [place] at no cost from [facility] and Me 05/31/21-03/26/22 ? ?[facility] uses [facility] ? ?[address] CPHT ?[facility] ?[place] ?Phone [phone] ?Fax [phone] ?05/31/2021 11:32 AM ? ?

## 2021-05-31 NOTE — Progress Notes (Unsigned)
HEMATOLOGY/ONCOLOGY PHONE VISIT NOTE  Date of Service: 05/31/2021  Patient Care Team: Denita Lung, MD as PCP - General (Family Medicine) Brunetta Genera, MD as Consulting Physician (Hematology) Viona Gilmore, [facility] as Pharmacist (Pharmacist)  CHIEF COMPLAINTS/PURPOSE OF CONSULTATION:  Follow-up for continued management of mantle cell lymphoma and gallbladder adenocarcinoma  HISTORY OF PRESENTING ILLNESS:  Please see previous HPI for details  .DOXOrubicin (ADRIAMYCIN) chemo injection 48 mg, 25 mg/m2 = 48 mg (100 % of original dose 25 mg/m2), Intravenous,  Once, 6 of 6 cycles  Dose modification: 25 mg/m2 (original dose 25 mg/m2, Cycle 1, Reason: Patient Age), 37.5 mg/m2 (original dose 25 mg/m2, Cycle 4, Reason: Provider Judgment)  Administration: 48 mg (08/05/2018), 48 mg (08/26/2018), 48 mg (09/16/2018), 72 mg (10/07/2018), 72 mg (10/28/2018), 72 mg (11/18/2018)    pegfilgrastim-cbqv (UDENYCA) injection 6 mg, 6 mg, Subcutaneous, Once, 6 of 6 cycles  Administration: 6 mg (08/07/2018), 6 mg (08/28/2018), 6 mg (09/18/2018), 6 mg (10/09/2018), 6 mg (10/30/2018), 6 mg (11/20/2018)    vinCRIStine (ONCOVIN) 1 mg in sodium chloride 0.9 % 50 mL chemo infusion, 1 mg (100 % of original dose 1 mg), Intravenous,  Once, 6 of 6 cycles  Dose modification: 1 mg (original dose 1 mg, Cycle 1, Reason: Patient Age)  Administration: 1 mg (08/05/2018), 1 mg (08/26/2018), 1 mg (09/16/2018), 1 mg (10/07/2018), 1 mg (10/28/2018), 1 mg (11/18/2018)    riTUXimab (RITUXAN) 700 mg in sodium chloride 0.9 % 250 mL (2.1875 mg/mL) infusion, 375 mg/m2 = 700 mg, Intravenous,  Once, 1 of 1 cycle  Administration: 700 mg (08/05/2018)    cyclophosphamide (CYTOXAN) 780 mg in sodium chloride 0.9 % 250 mL chemo infusion, 400 mg/m2 = 780 mg (100 % of original dose 400 mg/m2), Intravenous,  Once, 6 of 6 cycles  Dose modification: 400 mg/m2 (original dose 400 mg/m2, Cycle 1, Reason: Patient Age), 500 mg/m2 (original dose 400  mg/m2, Cycle 5, Reason: Provider Judgment)  Administration: 780 mg (08/05/2018), 780 mg (08/26/2018), 780 mg (09/16/2018), 780 mg (10/07/2018), 960 mg (10/28/2018), 960 mg (11/18/2018)    riTUXimab (RITUXAN) 700 mg in sodium chloride 0.9 % 180 mL infusion, 375 mg/m2 = 700 mg, Intravenous,  Once, 5 of 5 cycles  Administration: 700 mg (08/26/2018), 700 mg (09/16/2018), 700 mg (10/07/2018), 700 mg (10/28/2018), 700 mg (11/18/2018)  dexamethasone (DECADRON) 4 MG tablet, 8 mg, Oral, Daily, 1 of 1 cycle, Start date: 03/16/2020, End date: 09/03/2020    pegfilgrastim-cbqv [facility]) injection 6 mg, 6 mg, Subcutaneous, Once, 5 of 5 cycles  Administration: 6 mg (03/27/2020), 6 mg (04/24/2020), 6 mg (05/22/2020), 6 mg (06/21/2020), 6 mg (07/21/2020)    bendamustine (BENDEKA) 125 mg in sodium chloride 0.9 % 50 mL (2.2727 mg/mL) chemo infusion, 70 mg/m2 = 125 mg (100 % of original dose 70 mg/m2), Intravenous,  Once, 5 of 5 cycles  Dose modification: 70 mg/m2 (original dose 70 mg/m2, Cycle 1, Reason: Provider Judgment)  Administration: 125 mg (03/25/2020), 125 mg (03/26/2020), 125 mg (04/22/2020), 125 mg (04/23/2020), 125 mg (05/20/2020), 125 mg (05/21/2020), 125 mg (06/17/2020), 125 mg (06/18/2020), 125 mg (07/19/2020), 125 mg (07/20/2020)    riTUXimab-pvvr (RUXIENCE) 700 mg in sodium chloride 0.9 % 250 mL (2.1875 mg/mL) infusion, 375 mg/m2 = 700 mg, Intravenous,  Once, 2 of 2 cycles  Administration: 700 mg (03/25/2020)   INTERVAL HISTORY:   I connected with  Hermina Barters Probasco on 05/10/21 by a video enabled telemedicine application and verified that I am speaking with the  correct person using two identifiers. I discussed the limitations of evaluation and management by telemedicine. The patient expressed understanding and agreed to proceed.  Mario Garcia was going to for continued evaluation and management of his mantle cell lymphoma gallbladder adenocarcinoma. He has continued to take ibrutinib 280 mg p.o. daily without any  notable toxicities. Pt has been experiencing GERD and drowsiness. He has been taking Tums to manage his symptoms.   He had a PET CT scan 05/04/2021 which shows marked progression of lymphoma with increased cervical, axillary mediastinal adenopathy new hypermetabolic lymphoma involvement of the spleen. He also has progression of hypermetabolic para-aortic retroperitoneal iliac and inguinal adenopathy. Interval increase in size of solitary hypermetabolic Hepatic lesion     MEDICAL HISTORY:  Past Medical History:  Diagnosis Date   Arthritis    Cancer ([place])    PROSTATE   History of colonic polyps    History of kidney stones    Hypertension    Inguinal hernia 03/2002   Pneumonia    "years ago"    SURGICAL HISTORY: Past Surgical History:  Procedure Laterality Date   CATARACT EXTRACTION Bilateral    CHOLECYSTECTOMY N/A 12/14/2020   Procedure: LAPAROSCOPIC CHOLECYSTECTOMY;  Surgeon: Stark Klein, MD;  Location: [place];  Service: General;  Laterality: N/A;   COLONOSCOPY     DIRECT LARYNGOSCOPY N/A 07/08/2018   Procedure: DIRECT LARYNGOSCOPY;  Surgeon: Leta Baptist, MD;  Location: [place];  Service: ENT;  Laterality: N/A;   ESOPHAGOSCOPY N/A 07/08/2018   Procedure: ESOPHAGOSCOPY;  Surgeon: Leta Baptist, MD;  Location: [place];  Service: ENT;  Laterality: N/A;   [place] N/A 07/08/2018   Procedure: FLEXIBLE BRONCHOSCOPY;  Surgeon: Leta Baptist, MD;  Location: [place];  Service: ENT;  Laterality: N/A;   HERNIA REPAIR Left    inguinal hernia   INGUINAL HERNIA REPAIR Right 09/03/2020   Procedure: OPEN RIGHT INGUINAL HERNIA REPAIR;  Surgeon: Clovis Riley, MD;  Location: [facility];  Service: General;  Laterality: Right;   IR IMAGING GUIDED PORT INSERTION  07/30/2018   MASS BIOPSY Right 07/08/2018   Procedure: RIGHT NECK MASS EXCISIONAL BIOPSY;  Surgeon: Leta Baptist, MD;  Location: [facility];  Service: ENT;  Laterality: Right;   PATELLA FRACTURE SURGERY Left    PROSTATE SURGERY  10/2005    RADIAL PROSTATECTOMY   ROTATOR CUFF REPAIR Bilateral     SOCIAL HISTORY: Social History   Socioeconomic History   Marital status: Married    Spouse name: Not on file   Number of children: Not on file   Years of education: Not on file   Highest education level: Not on file  Occupational History   Not on file  Tobacco Use   Smoking status: Former   Smokeless tobacco: Never   Tobacco comments:    stopped at the age of  49  Vaping Use   Vaping Use: Never used  Substance and Sexual Activity   Alcohol use: Yes    Alcohol/week: 6.0 standard drinks    Types: 6 Standard drinks or equivalent per week   Drug use: No   Sexual activity: Not Currently  Other Topics Concern   Not on file  Social History Narrative   Not on file   Social Determinants of Health   Financial Resource Strain: Not on file  Food Insecurity: Not on file  Transportation Needs: Not on file  Physical Activity: Not on file  Stress: Not on file  Social  Connections: Not on file  Intimate Partner Violence: Not on file    FAMILY HISTORY: Family History  Problem Relation Age of Onset   Cancer Father    Cancer Brother     ALLERGIES:  is allergic to lisinopril and penicillins.  MEDICATIONS:  Current Outpatient Medications  Medication Sig Dispense Refill   azithromycin (ZITHROMAX) 250 MG tablet Take 2 tablets PO on day one, and one tablet PO daily thereafter until completed. 6 tablet 0   ibrutinib (IMBRUVICA) 280 MG tablet Take 1 tablet (280 mg) by mouth daily. Administer with a glass of water at approximately the same time each day. Swallow capsules or tablets whole. Do not open, break, or chew capsules; do not cut, crush, or chew tablets. Maintain adequate hydration during treatment. 30 tablet 2   latanoprost (XALATAN) 0.005 % ophthalmic solution Place 1 drop into both eyes at bedtime.     losartan-hydrochlorothiazide (HYZAAR) 50-12.5 MG tablet TAKE 1 TABLET BY MOUTH EVERY DAY 90 tablet 1   Multiple  Vitamins-Minerals (ICAPS AREDS 2 PO) Take 1 tablet by mouth daily.     pravastatin (PRAVACHOL) 40 MG tablet Take 1 tablet by mouth once daily 90 tablet 1   No current facility-administered medications for this visit.    REVIEW OF SYSTEMS:   10 Point review of Systems was done is negative except as noted above.  PHYSICAL EXAMINATION: Telemedicine visit   LABORATORY DATA:  I have reviewed the data as listed  CBC Latest Ref Rng & Units 05/31/2021 04/25/2021 02/25/2021  WBC 4.0 - 10.5 K/uL 7.8 8.8 5.9  Hemoglobin 13.0 - 17.0 g/dL 11.1(L) 11.8(L) 12.1(L)  Hematocrit 39.0 - 52.0 % 34.4(L) 37.2(L) 36.4(L)  Platelets 150 - 400 K/uL 109(L) 114(L) 101(L)    . CMP Latest Ref Rng & Units 04/25/2021 02/25/2021 01/24/2021  Glucose 70 - 99 mg/dL 93 86 108(H)  BUN 8 - 23 mg/dL _0 Creatinine 0.61 - 1.24 mg/dL 0.89 0.81 0.76  Sodium 135 - 145 mmol/L 137 138 140  Potassium 3.5 - 5.1 mmol/L 3.9 3.7 3.4(L)  Chloride 98 - 111 mmol/L 100 102 103  CO2 22 - 32 mmol/L _1 Calcium 8.9 - 10.3 mg/dL 9.0 8.7(L) 8.7(L)  Total Protein 6.5 - 8.1 g/dL 6.8 6.9 6.8  Total Bilirubin 0.3 - 1.2 mg/dL 0.5 0.5 0.5  Alkaline Phos 38 - 126 U/L 110 103 115  AST 15 - 41 U/L _2 ALT 0 - 44 U/L _3 . Lab Results  Component Value Date   LDH 195 (H) 04/25/2021     02/23/2020 Right Cervical Lymph Node Surgical Pathology [phone]):    02/23/2020 FISH Analysis:   02/23/2020 FISH Mutation Analysis:    03/03/2019 NM PET Image Restag (PS) Skull Base To Thigh (Accession [id number]):   07/08/18 Right Cervical Tissue Biopsy:    RADIOGRAPHIC STUDIES: I have personally reviewed the radiological images as listed and agreed with the findings in the report. MR LIVER W WO CONTRAST  Result Date: 05/26/2021 CLINICAL DATA:  Lymphoma, liver lesions EXAM: MRI ABDOMEN WITHOUT AND WITH CONTRAST TECHNIQUE: Multiplanar multisequence MR imaging of the abdomen was performed both before and after the  administration of intravenous contrast. CONTRAST:  63m GADAVIST GADOBUTROL 1 MMOL/ML IV SOLN COMPARISON:  PET-CT 05/04/2021, MRI abdomen 10/26/2020 FINDINGS: Lower chest: No acute findings. Hepatobiliary: There are multiple mildly hyperintense T2 signal irregular/ill-defined masses identified in the liver which demonstrate rim/surrounding enhancement most consistent with lymphomatous  disease. The largest lesion is centered in segment 4B with multiple surrounding smaller masses, measuring up to 5.2 x 4 cm in total size axial dimensions. 3.5 x 3.2 cm mass in the caudate lobe. Two masses in the left hepatic lobe measuring 1.3 cm in segment 2 and 0.8 cm in segment 3. 0.8 cm mass at the posterior dome of the right hepatic lobe segment 7. Gallbladder is surgically absent. No biliary ductal dilatation identified. Pancreas: No mass, inflammatory changes, or other parenchymal abnormality identified. Spleen: Enlarged measuring 13.6 cm in length with no focal lesions identified. Adrenals/Urinary Tract: Adrenal glands appear normal. No hydronephrosis or suspicious enhancing renal masses identified bilaterally. Stomach/Bowel: No evidence of bowel obstruction. Vascular/Lymphatic: Numerous enlarged lymph nodes including: Retroperitoneal lymph nodes measuring up to 1.3 x 2.1 cm right para-aortic, enlarged mesenteric lymph nodes measuring up to 1.1 cm in short axis, enlarged portacaval region lymph nodes measuring up to 1.9 x 4 cm. No significant vascular abnormality identified. Other:  No ascites. Musculoskeletal: There is a 1.6 cm hyperintense T2, isointense T1 signal enhancing lesion in the left aspect of the L4 vertebral body. IMPRESSION: 1. Multiple hepatic masses as described, consistent with lymphomatous disease. 2. Extensive lymphadenopathy most prominent in the retroperitoneum and portacaval region. 3. Splenomegaly. 4. Enhancing lesion in the L4 vertebral body felt to most likely represent a hemangioma, appears stable in  size since 2020, subtle sclerotic focus on PET-CT. Electronically Signed   By: Ofilia Neas M.D.   On: 05/26/2021 10:05   NM PET Image Restag (PS) Skull Base To Thigh  Result Date: 05/04/2021 CLINICAL DATA:  Subsequent treatment strategy for mantle cell lymphoma. Additional history of gallbladder and biliary cancer. Last chemotherapy 03/29/2021. Cholecystectomy 12/14/2020. EXAM: NUCLEAR MEDICINE PET SKULL BASE TO THIGH TECHNIQUE: 7.7 mCi F-18 FDG was injected intravenously. Full-ring PET imaging was performed from the skull base to thigh after the radiotracer. CT data was obtained and used for attenuation correction and anatomic localization. Fasting blood glucose: 88 mg/dl COMPARISON:  None. FINDINGS: Mediastinal blood pool activity: SUV max 1.59 Liver activity: SUV max NA NECK: New bilateral hypermetabolic cervical lymph nodes. Example cluster of level 2 lymph nodes on the RIGHT with SUV max equal 13.4. Nodes involved level 2 and level 3 nodal stations Incidental CT findings: none CHEST: Progression of bilateral axillary lymph nodes. Lymph nodes increased in size and number compared to prior. Example node in the LEFT axilla measures 10 mm increased from 5 mm with SUV max equal 14.5 increased from SUV max equal 7.3 (image 52) Similar increase in size and metabolic activity of RIGHT axillary nodes with SUV max equal 12.7 increased from 5.0 Increased hypermetabolic mediastinal hilar nodes. Example subcarinal node measures 12 mm short axis increased from 5 mm with SUV max equal 12.2 compared to 4.8. Incidental CT findings: No suspicious pulmonary nodules. Port in the anterior chest wall with tip in distal SVC. ABDOMEN/PELVIS: Dramatic increase in metabolic activity of the spleen with SUV max equal 9.5 increased from normal activity. Spleen is also mildly increased in size. There is new hypermetabolic periportal lymph nodes and periaortic retroperitoneal nodes the upper abdomen. Peripherally hypermetabolic  lesion in the liver SUV max equal 8.7. This presumably same smaller lesion adjacent the gallbladder fossa on comparison exam Extensive hypermetabolic adenopathy extends along the aorta into the iliac chains. LEFT external iliac nodes are similar prior with SUV max equal 15.5 increased from 11.3. There is increase in hypermetabolic inguinal nodes which are intensely hypermetabolic Incidental CT findings:  none SKELETON: No focal hypermetabolic activity to suggest skeletal metastasis. Incidental CT findings: none IMPRESSION: 1. Marked progression lymphoma compared FDG PET scan 10/01/2020. 2. New increased cervical adenopathy, axillary adenopathy mediastinal adenopathy. 3. New hypermetabolic Lymphoma involvement of the spleen. 4. Interval increase in size of solitary hypermetabolic hepatic lesion 5. Progression of hypermetabolic paraaortic retroperitoneal adenopathy, iliac adenopathy and inguinal adenopathy. Electronically Signed   By: Suzy Bouchard M.D.   On: 05/04/2021 16:55    ASSESSMENT & PLAN:   88 y.o. male with  1. H/o Diffuse Large B-Cell Lymphoma, Stage IV- now in remission Presenting without constitutional symptoms. Palpable right cervical and supraclavicular lymphadenopathy.  07/08/18 Right cervical soft tissue biopsy revealed Diffuse Large B-Cell Lymphoma, germinal center type  06/05/18 CT Neck revealed Pathologic RIGHT-sided level II, III, IV, and V lymphadenopathy, most consistent with metastatic carcinoma. An obvious primary source is not identified. Tissue sampling is warranted. 07/16/18 Hep B and Hep C negative 07/17/18 ECHO revealed LV EF of 60-65% 07/22/18 PET/CT revealed "Hypermetabolic adenopathy especially concentrated in the right neck but also in the left neck, chest, abdomen, and pelvis. The adenopathy is primarily Deauville 5 although some few scattered smaller lesions are Deauville 4. 2. Diffuse abnormal splenic activity, Deauville 5, without overt splenomegaly. 3. There is also  hypermetabolic Deauville 5 activity in the mildly thickened distal appendix, and raising suspicion for involvement of the appendix. 4. Other imaging findings of potential clinical significance: Aortic Atherosclerosis. Coronary atherosclerosis." 10/03/2018 PET scan revealed "Interval response to therapy with stable to decreased size of lymph nodes on CT and generalized decrease in hypermetabolism of the abnormal nodes. Hypermetabolism in the lymph nodes today is compatible with a combination of Deauville 3 and Deauville 4 disease. Stable tiny bilateral pulmonary nodules. Increase radiotracer accumulation in the marrow space on today's study, presumably representing marrow stimulatory effects of therapy."  2. Currently with new Non-Hodgkin's B-cell Lymphoma - consistent with Mantle Cell 02/23/2020 Right Cervical Lymph Node Surgical Pathology [phone]) which revealed "Non-Hodgkin B-cell lymphoma. Ki-67 generally shows low expression (<5-15%)." 02/23/2020 FISH Mutation Analysis which revealed "t(11;14): DETECTED".  3.  History of gallbladder adenocarcinoma status postcholecystectomy on 12/29/2020 -Pathology from his recent gallbladder surgery showed GALLBLADDER, CHOLECYSTECTOMY:  - Adenocarcinoma, 2.4 cm maximally, with perforation of serosa. Negative for carcinoma in one lymph node. Cholelithiasis.  CA 19-9 tumor marker continues to gradually increase which is concerning. No new abdominal symptoms.  Normal liver function tests. PLAN -Discussed patient's PET/CT scan results in detail noted above. -Continue high risk broad progression/tumor related reaction from his mantle cell lymphoma at this time.  We shall increase his ibrutinib to the standard dose of 420 mg p.o. daily since he is tolerating the to 80 mg p.o. daily without any overt toxicities. -Might need to consider adding venetoclax to the ibrutinib if inadequate response.  -Patient has progression of single hypermetabolic hepatic lesion  likely from the patient's gallbladder adenocarcinoma in the context of increasing CA 19-9 levels. -We will refer the patient to radiation oncology to consider IMRT to isolated liver metastasis from gallbladder carcinoma  FOLLOW UP: -Increased dose to Ibruitnib to 444m po daily -Referral to radiation oncology for consideration of ISRT to isolated liver metastases from adenocarcinoma -Return to clinic with Dr. KIrene Limboin 3 weeks with labs   All of the patient's questions were answered with apparent satisfaction. The patient knows to call the clinic with any problems, questions or concerns.   GSullivan LoneMD MPawneeAAHIVMS SSt. John SapuLPaCAdvanced Surgery Center Of Orlando LLCHematology/Oncology Physician [facility]  [facility]

## 2021-05-31 NOTE — Telephone Encounter (Signed)
Oral Oncology Patient Advocate Encounter ? ?Completed online application for [place] and [place] Patient [place] Program in an effort to reduce the patient's out of pocket expense for Calquence to $0.   ? ?Application completed and faxed to [phone].  ? ?[employer] patient assistance phone number for follow up is [phone].  ? ?This encounter will be updated until final determination. ?Wynn Maudlin CPHT ?Specialty Pharmacy Patient Advocate ?[place] ?Phone [phone] ?Fax [phone] ?05/31/2021 11:30 AM ? ?

## 2021-05-31 NOTE — Telephone Encounter (Signed)
Oral Oncology Pharmacist Encounter ? ?Received new prescription for Calquence (acalabrutinib) for the treatment of previously treated mantle cell lymphoma, planned duration until disease progression or unacceptable drug toxicity. ? ?CBC w/ Diff and CMP from 05/31/21 assessed, noted pltc 109 K/uL. LDH up from 195 U/L to 212 U/L. No baseline dose adjustments required. Prescription dose and frequency assessed for appropriateness. Dosage formulation changed from capsules to tablets, as the capsule formulation is being discontinued by manufacturer.  ? ?Current medication list in Epic reviewed, no relevant/significant DDIs with Calquence identified. ? ?Evaluated chart and no patient barriers to medication adherence noted.  ? ?Due to being uninsured - patient will have to proceed with obtaining medication through manufacturer assistance.  ? ?Oral Oncology Clinic will continue to follow for copayment issues, initial counseling and start date. ? ?Leron Croak, PharmD, BCPS ?Hematology/Oncology Clinical Pharmacist ?[facility] Oral Chemotherapy Navigation Clinics ?[phone] ?05/31/2021 11:38 AM ? ?

## 2021-05-31 NOTE — Telephone Encounter (Signed)
Scheduled follow-up appointment per 3/7 los and rescheduled upcoming appointment due to provider's PAL. Patient is aware of changes. ?

## 2021-06-01 ENCOUNTER — Encounter (HOSPITAL_COMMUNITY): Payer: Self-pay

## 2021-06-01 ENCOUNTER — Other Ambulatory Visit: Payer: Self-pay | Admitting: Hematology

## 2021-06-01 NOTE — Progress Notes (Signed)
Patient Name  ?Mario Garcia Legal Sex  ?Male DOB  ?04-10-1931 SSN  ?[id number] Address  ?[name] [address] Phone  ?[phone] (Home)  ?[phone] (Mobile) *Preferred*  ? ? RE: CT Biopsy ?Received: Yesterday ?Aletta Edouard, MD  [name] M ?I spoke to Dr. Irene Limbo. He wants US guided biopsy of hypermetabolic liver mass to determine if it is mantle cell lymphoma versus recurrent metastatic GB carcinoma.  ? ?GY   ?  ?   ?Previous Messages ?  ?----- Message -----  ?From: Valli Glance  ?Sent: 05/31/2021  10:25 AM EST  ?To: Ir Procedure Requests  ?Subject: CT Biopsy                                      ? ?CT Biopsy  ? ? ? ? ? ? ? ?Reason:Gallbladder cancer, Mantle cell lymphoma of lymph nodes of multiple regions  ? ? ? ? ? ? ? ? ? ? ?History: MRI and PET in computer  ? ? ? ? ? ? ? ? ?Provider: Brunetta Genera  ? ? ? ? ? ? ?Contact: [phone]  ?

## 2021-06-02 ENCOUNTER — Telehealth: Payer: Self-pay | Admitting: Radiation Oncology

## 2021-06-02 DIAGNOSIS — D126 Benign neoplasm of colon, unspecified: Secondary | ICD-10-CM | POA: Insufficient documentation

## 2021-06-02 NOTE — Telephone Encounter (Signed)
I spoke with the patient and let him know that his recent MRI scan showed 5 lesions, and Dr. Lisbeth Renshaw does not feel that the amount of disease in the liver would not be amenable to SBRT. Rather he may be a candidate for Y90 and Dr. Lisbeth Renshaw recommends discussion with IR.  He is going to have a liver biopsy on 06/10/21. So I'll try to find out who would be doing his biopsy and see if they would look at his case to see if he is a candidate for Y90 as well.  ?

## 2021-06-07 ENCOUNTER — Encounter: Payer: Self-pay | Admitting: Hematology

## 2021-06-07 ENCOUNTER — Other Ambulatory Visit: Payer: Self-pay | Admitting: Radiation Oncology

## 2021-06-07 DIAGNOSIS — Z85828 Personal history of other malignant neoplasm of skin: Secondary | ICD-10-CM | POA: Diagnosis not present

## 2021-06-07 DIAGNOSIS — L570 Actinic keratosis: Secondary | ICD-10-CM | POA: Diagnosis not present

## 2021-06-07 DIAGNOSIS — K769 Liver disease, unspecified: Secondary | ICD-10-CM

## 2021-06-08 NOTE — Telephone Encounter (Signed)
Oral Chemotherapy Pharmacist Encounter ? ?I spoke with patient for overview of: Calquence (acalabrutinib) for the treatment of previously treated mantle cell lymphoma, planned duration until disease progression or unacceptable toxicity.  ? ?Counseled patient on administration, dosing, side effects, monitoring, drug-food interactions, safe handling, storage, and disposal. ? ?Patient will take Calquence '100mg'$  tablets, 1 tablet by mouth approximately 12 hours apart, with or with out food, with a glass of water. ? ?Calquence start date: 06/11/21 ? ?Adverse effects include but are not limited to: headache, diarrhea, fatigue, rash, muscle pain, bruising, decreased blood counts, and altered cardiac conduction.   ? ?Reviewed with patient importance of keeping a medication schedule and plan for any missed doses. No barriers to medication adherence identified. ? ?Medication reconciliation performed and medication/allergy list updated. ? ?Patient enrolled in manufacturer assistance through [employer] to obtain medication at no cost. Patient's [employer] patient ID is [id number]. Medication will be shipped to patient's home from [facility] (dispensing pharmacy). This will be delivered to patient's home on 06/09/21. ? ?All questions answered. ? ?Mario Garcia voiced understanding and appreciation.  ? ?Medication education handout placed in mail for patient. Patient knows to call the office with questions or concerns. Oral Chemotherapy Clinic phone number provided to patient.  ? ?Mario Garcia, PharmD, BCPS ?Hematology/Oncology Clinical Pharmacist ?[facility] Oral Chemotherapy Navigation Clinics ?[phone] ?06/08/2021 12:38 PM ? ?

## 2021-06-09 ENCOUNTER — Other Ambulatory Visit: Payer: Self-pay | Admitting: Radiology

## 2021-06-10 ENCOUNTER — Ambulatory Visit (HOSPITAL_COMMUNITY)
Admission: RE | Admit: 2021-06-10 | Discharge: 2021-06-10 | Disposition: A | Discharge status: Home / Self Care | Payer: Medicare Other | Source: Ambulatory Visit | Attending: Hematology | Admitting: Hematology

## 2021-06-10 ENCOUNTER — Other Ambulatory Visit: Payer: Self-pay

## 2021-06-10 ENCOUNTER — Encounter (HOSPITAL_COMMUNITY): Payer: Self-pay

## 2021-06-10 DIAGNOSIS — I10 Essential (primary) hypertension: Secondary | ICD-10-CM | POA: Diagnosis not present

## 2021-06-10 DIAGNOSIS — C23 Malignant neoplasm of gallbladder: Secondary | ICD-10-CM | POA: Diagnosis not present

## 2021-06-10 DIAGNOSIS — C8318 Mantle cell lymphoma, lymph nodes of multiple sites: Secondary | ICD-10-CM | POA: Insufficient documentation

## 2021-06-10 DIAGNOSIS — C229 Malignant neoplasm of liver, not specified as primary or secondary: Secondary | ICD-10-CM | POA: Diagnosis not present

## 2021-06-10 DIAGNOSIS — M1990 Unspecified osteoarthritis, unspecified site: Secondary | ICD-10-CM | POA: Insufficient documentation

## 2021-06-10 DIAGNOSIS — R160 Hepatomegaly, not elsewhere classified: Secondary | ICD-10-CM | POA: Diagnosis not present

## 2021-06-10 LAB — CBC
HCT: 34.8 % — ABNORMAL LOW (ref 39.0–52.0)
Hemoglobin: 11 g/dL — ABNORMAL LOW (ref 13.0–17.0)
MCH: 28.6 pg (ref 26.0–34.0)
MCHC: 31.6 g/dL (ref 30.0–36.0)
MCV: 90.6 fL (ref 80.0–100.0)
Platelets: 115 10*3/uL — ABNORMAL LOW (ref 150–400)
RBC: 3.84 MIL/uL — ABNORMAL LOW (ref 4.22–5.81)
RDW: 12.9 % (ref 11.5–15.5)
WBC: 6.2 10*3/uL (ref 4.0–10.5)
nRBC: 0 % (ref 0.0–0.2)

## 2021-06-10 LAB — PROTIME-INR
INR: 1 (ref 0.8–1.2)
Prothrombin Time: 13.3 seconds (ref 11.4–15.2)

## 2021-06-10 MED ORDER — GELATIN ABSORBABLE 12-7 MM EX MISC
CUTANEOUS | Status: AC
  Filled 2021-06-10: qty 1

## 2021-06-10 MED ORDER — FENTANYL CITRATE (PF) 100 MCG/2ML IJ SOLN
INTRAMUSCULAR | Status: AC
  Filled 2021-06-10: qty 2

## 2021-06-10 MED ORDER — SODIUM CHLORIDE 0.9 % IV SOLN: INTRAVENOUS | Status: DC

## 2021-06-10 MED ORDER — MIDAZOLAM HCL 2 MG/2ML IJ SOLN
INTRAMUSCULAR | Status: AC | PRN
  Administered 2021-06-10: 1 mg via INTRAVENOUS

## 2021-06-10 MED ORDER — FENTANYL CITRATE (PF) 100 MCG/2ML IJ SOLN
INTRAMUSCULAR | Status: AC | PRN
  Administered 2021-06-10: 25 ug via INTRAVENOUS

## 2021-06-10 MED ORDER — LIDOCAINE HCL (PF) 1 % IJ SOLN
INTRAMUSCULAR | Status: AC
  Filled 2021-06-10: qty 30

## 2021-06-10 MED ORDER — MIDAZOLAM HCL 2 MG/2ML IJ SOLN
INTRAMUSCULAR | Status: AC
  Filled 2021-06-10: qty 2

## 2021-06-10 NOTE — H&P (Signed)
? ?Chief Complaint: ?Patient was seen in consultation today for mantle cell lymphoma ? ?Referring Physician(s): ?Kale,Gautam Kishore ? ?Supervising Physician: Corrie Mckusick ? ?Patient Status: [facility] - Out-pt ? ?History of Present Illness: ?Mario Garcia is a 88 y.o. male with past medical history of HTN, arthritis, and mantle cell lymphoma currently undergoing chemotherapy who was recently found to have liver lesions.  IR consulted for liver lesion biopsy at the request of Dr. Irene Limbo.  Case reviewed by Dr. Kathlene Cote who approves patient for the procedure today.   ? ?Mario Garcia presents today in his usual state of health.  He denies new concerns or complaints. He has been NPO.  He will be transported home by his wife.  He is agreeable to procedure today. ? ?Past Medical History:  ?Diagnosis Date  ? Arthritis   ? Cancer [facility])   ? PROSTATE  ? History of colonic polyps   ? History of kidney stones   ? Hypertension   ? Inguinal hernia 03/2002  ? Pneumonia   ? "years ago"  ? ? ?Past Surgical History:  ?Procedure Laterality Date  ? CATARACT EXTRACTION Bilateral   ? CHOLECYSTECTOMY N/A 12/14/2020  ? Procedure: LAPAROSCOPIC CHOLECYSTECTOMY;  Surgeon: Stark Klein, MD;  Location: [place];  Service: General;  Laterality: N/A;  ? COLONOSCOPY    ? DIRECT LARYNGOSCOPY N/A 07/08/2018  ? Procedure: DIRECT LARYNGOSCOPY;  Surgeon: Leta Baptist, MD;  Location: [place];  Service: ENT;  Laterality: N/A;  ? ESOPHAGOSCOPY N/A 07/08/2018  ? Procedure: ESOPHAGOSCOPY;  Surgeon: Leta Baptist, MD;  Location: [place];  Service: ENT;  Laterality: N/A;  ? EYE SURGERY    ? FLEXIBLE BRONCHOSCOPY N/A 07/08/2018  ? Procedure: FLEXIBLE BRONCHOSCOPY;  Surgeon: Leta Baptist, MD;  [facility];  Service: ENT;  Laterality: N/A;  ? HERNIA REPAIR Left   ? inguinal hernia  ? INGUINAL HERNIA REPAIR Right 09/03/2020  ? Procedure: OPEN RIGHT INGUINAL HERNIA REPAIR;  Surgeon: Clovis Riley, MD;  Location: [facility];  Service: General;  Laterality: Right;  ? IR IMAGING GUIDED  PORT INSERTION  07/30/2018  ? MASS BIOPSY Right 07/08/2018  ? Procedure: RIGHT NECK MASS EXCISIONAL BIOPSY;  Surgeon: Leta Baptist, MD;  Location: [place];  Service: ENT;  Laterality: Right;  ? PATELLA FRACTURE SURGERY Left   ? PROSTATE SURGERY  10/2005  ? RADIAL PROSTATECTOMY  ? ROTATOR CUFF REPAIR Bilateral   ? ? ?Allergies: ?Lisinopril and Penicillins ? ?Medications: ?Prior to Admission medications   ?Medication Sig Start Date End Date Taking? Authorizing Provider  ?acetaminophen (TYLENOL) 500 MG tablet Take 1,000 mg by mouth every 6 (six) hours as needed for moderate pain.   Yes [provider]  ?ARTIFICIAL TEAR SOLUTION OP Place 1 drop into both eyes 2 (two) times daily.   Yes [provider]  ?calcium carbonate (TUMS - DOSED IN MG ELEMENTAL CALCIUM) 500 MG chewable tablet Chew 2 tablets by mouth daily as needed for indigestion or heartburn.   Yes [provider]  ?latanoprost (XALATAN) 0.005 % ophthalmic solution Place 1 drop into both eyes at bedtime.   Yes [provider]  ?lidocaine-prilocaine (EMLA) cream Apply 1 application. topically as needed (port access).   Yes [provider]  ?losartan-hydrochlorothiazide (HYZAAR) 50-12.5 MG tablet TAKE 1 TABLET BY MOUTH EVERY DAY 04/18/21  Yes Denita Lung, MD  ?pravastatin (PRAVACHOL) 40 MG tablet Take 1 tablet by mouth once daily 04/12/21  Yes Denita Lung, MD  ?[place] 1 application.  topically daily as needed (sun exposure). FLUOROURACIL 5% + CALCIPOTRIENE 0.005%   Yes [provider]  ?Acalabrutinib Maleate (CALQUENCE) 100 MG TABS Take 100 mg by mouth 2 (two) times daily. 05/31/21   Brunetta Genera, MD  ?  ? ?Family History  ?Problem Relation Age of Onset  ? Cancer Father   ? Cancer Brother   ? ? ?Social History  ? ?Socioeconomic History  ? Marital status: Married  ?  Spouse name: Not on file  ? Number of children: Not on file  ? Years of education: Not on file  ? Highest education  level: Not on file  ?Occupational History  ? Not on file  ?Tobacco Use  ? Smoking status: Former  ? Smokeless tobacco: Never  ? Tobacco comments:  ?  stopped at the age of  22  ?Vaping Use  ? Vaping Use: Never used  ?Substance and Sexual Activity  ? Alcohol use: Yes  ?  Alcohol/week: 6.0 standard drinks  ?  Types: 6 Standard drinks or equivalent per week  ? Drug use: No  ? Sexual activity: Not Currently  ?Other Topics Concern  ? Not on file  ?Social History Narrative  ? Not on file  ? ?Social Determinants of Health  ? ?Financial Resource Strain: Not on file  ?Food Insecurity: Not on file  ?Transportation Needs: Not on file  ?Physical Activity: Not on file  ?Stress: Not on file  ?Social Connections: Not on file  ? ? ? ?Review of Systems: A 12 point ROS discussed and pertinent positives are indicated in the HPI above.  All other systems are negative. ? ?Review of Systems  ?Constitutional:  Negative for fatigue and fever.  ?Respiratory:  Negative for cough and shortness of breath.   ?Cardiovascular:  Negative for chest pain.  ?Gastrointestinal:  Negative for abdominal pain.  ?Musculoskeletal:  Negative for back pain.  ?Psychiatric/Behavioral:  Negative for behavioral problems and confusion.   ? ?Vital Signs: ?BP 132/63   Pulse 69   Temp 98.1 ?F (36.7 ?C) (Oral)   Resp 17   Ht _0  (1.778 m)   Wt 152 lb (68.9 kg)   SpO2 98%   BMI 21.81 kg/m?  ? ?Physical Exam ?Vitals and nursing note reviewed.  ?Constitutional:   ?   General: He is not in acute distress. ?   Appearance: Normal appearance. He is not ill-appearing.  ?HENT:  ?   Mouth/Throat:  ?   Mouth: Mucous membranes are moist.  ?   Pharynx: Oropharynx is clear.  ?Cardiovascular:  ?   Rate and Rhythm: Normal rate and regular rhythm.  ?Pulmonary:  ?   Effort: Pulmonary effort is normal.  ?   Breath sounds: Normal breath sounds.  ?Musculoskeletal:     ?   General: Normal range of motion.  ?   Cervical back: Normal range of motion.  ?Skin: ?   General: Skin is  warm and dry.  ?Neurological:  ?   General: No focal deficit present.  ?   Mental Status: He is alert and oriented to person, place, and time. Mental status is at baseline.  ?Psychiatric:     ?   Mood and Affect: Mood normal.     ?   Behavior: Behavior normal.     ?   Thought Content: Thought content normal.     ?   Judgment: Judgment normal.  ? ? ? ?MD Evaluation ?Airway: WNL ?Heart: WNL ?Abdomen: WNL ?Chest/ Lungs: WNL ?ASA  Classification: 3 ?Mallampati/Airway Score: Two ? ? ?Imaging: ?MR LIVER W WO CONTRAST ? ?Result Date: 05/26/2021 ?CLINICAL DATA:  Lymphoma, liver lesions EXAM: MRI ABDOMEN WITHOUT AND WITH CONTRAST TECHNIQUE: Multiplanar multisequence MR imaging of the abdomen was performed both before and after the administration of intravenous contrast. CONTRAST:  50m GADAVIST GADOBUTROL 1 MMOL/ML IV SOLN COMPARISON:  PET-CT 05/04/2021, MRI abdomen 10/26/2020 FINDINGS: Lower chest: No acute findings. Hepatobiliary: There are multiple mildly hyperintense T2 signal irregular/ill-defined masses identified in the liver which demonstrate rim/surrounding enhancement most consistent with lymphomatous disease. The largest lesion is centered in segment 4B with multiple surrounding smaller masses, measuring up to 5.2 x 4 cm in total size axial dimensions. 3.5 x 3.2 cm mass in the caudate lobe. Two masses in the left hepatic lobe measuring 1.3 cm in segment 2 and 0.8 cm in segment 3. 0.8 cm mass at the posterior dome of the right hepatic lobe segment 7. Gallbladder is surgically absent. No biliary ductal dilatation identified. Pancreas: No mass, inflammatory changes, or other parenchymal abnormality identified. Spleen: Enlarged measuring 13.6 cm in length with no focal lesions identified. Adrenals/Urinary Tract: Adrenal glands appear normal. No hydronephrosis or suspicious enhancing renal masses identified bilaterally. Stomach/Bowel: No evidence of bowel obstruction. Vascular/Lymphatic: Numerous enlarged lymph nodes  including: Retroperitoneal lymph nodes measuring up to 1.3 x 2.1 cm right para-aortic, enlarged mesenteric lymph nodes measuring up to 1.1 cm in short axis, enlarged portacaval region lymph nodes measuring up to 1.9 x

## 2021-06-10 NOTE — Procedures (Signed)
Interventional Radiology Procedure Note ? ?Procedure: US guided biopsy of liver mass, mx 18g core ?Complications: None ?EBL: None ?Recommendations: ?- Bedrest 2 hours.   ?- Routine wound care ?- Follow up pathology ?- Advance diet  ? ?Signed, ? ?Corrie Mckusick, DO ? ? ?

## 2021-06-13 DIAGNOSIS — H26491 Other secondary cataract, right eye: Secondary | ICD-10-CM | POA: Diagnosis not present

## 2021-06-14 LAB — SURGICAL PATHOLOGY

## 2021-06-15 ENCOUNTER — Inpatient Hospital Stay (HOSPITAL_BASED_OUTPATIENT_CLINIC_OR_DEPARTMENT_OTHER): Payer: Medicare Other | Admitting: Hematology

## 2021-06-15 ENCOUNTER — Ambulatory Visit
Admission: RE | Admit: 2021-06-15 | Discharge: 2021-06-15 | Disposition: A | Discharge status: Home / Self Care | Payer: Medicare Other | Source: Ambulatory Visit | Attending: Radiation Oncology | Admitting: Radiation Oncology

## 2021-06-15 ENCOUNTER — Other Ambulatory Visit: Payer: Self-pay

## 2021-06-15 ENCOUNTER — Encounter: Payer: Self-pay | Admitting: *Deleted

## 2021-06-15 VITALS — BP 130/61 | HR 74 | Temp 97.5°F | Resp 18 | Wt 149.9 lb

## 2021-06-15 DIAGNOSIS — C8318 Mantle cell lymphoma, lymph nodes of multiple sites: Secondary | ICD-10-CM | POA: Diagnosis not present

## 2021-06-15 DIAGNOSIS — K769 Liver disease, unspecified: Secondary | ICD-10-CM

## 2021-06-15 DIAGNOSIS — Z87891 Personal history of nicotine dependence: Secondary | ICD-10-CM | POA: Diagnosis not present

## 2021-06-15 DIAGNOSIS — C23 Malignant neoplasm of gallbladder: Secondary | ICD-10-CM

## 2021-06-15 DIAGNOSIS — C787 Secondary malignant neoplasm of liver and intrahepatic bile duct: Secondary | ICD-10-CM | POA: Diagnosis not present

## 2021-06-15 DIAGNOSIS — C8310 Mantle cell lymphoma, unspecified site: Secondary | ICD-10-CM | POA: Diagnosis not present

## 2021-06-15 HISTORY — PX: IR RADIOLOGIST EVAL & MGMT: IMG5224

## 2021-06-15 NOTE — Consult Note (Signed)
? ? ?Chief Complaint: ?Patient was seen in consultation today for liver directed therapy at the request of Hayden Pedro ? ?Referring Physician(s): ?Hayden Pedro ? ?History of Present Illness: ?Mario Garcia is a 88 y.o. male with a history of mantle cell lymphoma which has been treated by Dr. Irene Limbo since April, 2020 with extensive chemotherapy.  He underwent cholecystectomy on 12/14/2020 by Dr. Barry Dienes due to concerns for possible gallbladder carcinoma by PET scan and MRI.  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   ? ?Due to rising CA 19-9 levels, PET scan was performed on 05/04/2021 demonstrating hypermetabolic activity within the liver near the gallbladder fossa.  MRI on 05/25/2021 demonstrated significant hepatic disease with large central lesion in segment IVb crossing into the right lobe and measuring approximately 5 cm with multiple adjacent satellite nodules, a 3.5 cm mass in the caudate, 2 small lesions in the left lobe and a small lesion at the posterior dome of the liver.  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 ? ?[name] does have significant fatigue which is worse as the day progresses and recently Dr. Irene Limbo switched his immunotherapy medication.  He does take some short naps during the day but otherwise is able to perform all activities of daily living.  He has a good appetite and his weight has been stable over the last 3 months.  He had some pain after the biopsy procedure which has resolved.  He currently has no abdominal pain.  He has noticed recently some mild ankle edema, left greater than right.  He lives at home with his wife. ? ?Past Medical History:  ?Diagnosis Date  ? Arthritis   ? Cancer [facility])   ? PROSTATE  ? History of  colonic polyps   ? History of kidney stones   ? Hypertension   ? Inguinal hernia 03/2002  ? Pneumonia   ? "years ago"  ? ? ?Past Surgical History:  ?Procedure Laterality Date  ? CATARACT EXTRACTION Bilateral   ? CHOLECYSTECTOMY N/A 12/14/2020  ? Procedure: LAPAROSCOPIC CHOLECYSTECTOMY;  Surgeon: Stark Klein, MD;  Location: [place];  Service: General;  Laterality: N/A;  ? COLONOSCOPY    ? DIRECT LARYNGOSCOPY N/A 07/08/2018  ? Procedure: DIRECT LARYNGOSCOPY;  Surgeon: Leta Baptist, MD;  Location: [place];  Service: ENT;  Laterality: N/A;  ? ESOPHAGOSCOPY N/A 07/08/2018  ? Procedure: ESOPHAGOSCOPY;  Surgeon: Leta Baptist, MD;  Location: [place];  Service: ENT;  Laterality: N/A;  ? EYE SURGERY    ? FLEXIBLE BRONCHOSCOPY N/A 07/08/2018  ? Procedure: FLEXIBLE BRONCHOSCOPY;  Surgeon: Leta Baptist, MD;  [facility];  Service: ENT;  Laterality: N/A;  ? HERNIA REPAIR Left   ? inguinal hernia  ? INGUINAL HERNIA REPAIR Right 09/03/2020  ? Procedure: OPEN RIGHT INGUINAL HERNIA REPAIR;  Surgeon: Clovis Riley, MD;  [facility];  Service: General;  Laterality: Right;  ? IR IMAGING GUIDED PORT INSERTION  07/30/2018  ? IR RADIOLOGIST EVAL & MGMT  06/15/2021  ? MASS BIOPSY Right 07/08/2018  ? Procedure: RIGHT NECK MASS EXCISIONAL BIOPSY;  Surgeon: Leta Baptist, MD;  Location: [place];  Service: ENT;  Laterality: Right;  ? PATELLA FRACTURE SURGERY Left   ? PROSTATE SURGERY  10/2005  ? RADIAL PROSTATECTOMY  ? ROTATOR CUFF REPAIR Bilateral   ? ? ?Allergies: ?  Lisinopril and Penicillins ? ?Medications: ?Prior to Admission medications   ?Medication Sig Start Date End Date Taking? Authorizing Provider  ?Acalabrutinib Maleate (CALQUENCE) 100 MG TABS Take 100 mg by mouth 2 (two) times daily. 05/31/21   Brunetta Genera, MD  ?acetaminophen (TYLENOL) 500 MG tablet Take 1,000 mg by mouth every 6 (six) hours as needed for moderate pain.    [provider]  ?ARTIFICIAL TEAR SOLUTION OP Place 1 drop into both eyes 2 (two) times daily.    [provider]  ?calcium carbonate (TUMS - DOSED IN MG ELEMENTAL CALCIUM) 500 MG chewable tablet Chew 2 tablets by mouth daily as needed for indigestion or heartburn.    [provider]  ?latanoprost (XALATAN) 0.005 % ophthalmic solution Place 1 drop into both eyes at bedtime.    [provider]  ?lidocaine-prilocaine (EMLA) cream Apply 1 application. topically as needed (port access).    [provider]  ?losartan-hydrochlorothiazide (HYZAAR) 50-12.5 MG tablet TAKE 1 TABLET BY MOUTH EVERY DAY 04/18/21   Denita Lung, MD  ?pravastatin (PRAVACHOL) 40 MG tablet Take 1 tablet by mouth once daily 04/12/21   Denita Lung, MD  ?[place] 1 application. topically daily as needed (sun exposure). FLUOROURACIL 5% + CALCIPOTRIENE 0.005%    [provider]  ?  ? ?Family History  ?Problem Relation Age of Onset  ? Cancer Father   ? Cancer Brother   ? ? ?Social History  ? ?Socioeconomic History  ? Marital status: Married  ?  Spouse name: Not on file  ? Number of children: Not on file  ? Years of education: Not on file  ? Highest education level: Not on file  ?Occupational History  ? Not on file  ?Tobacco Use  ? Smoking status: Former  ? Smokeless tobacco: Never  ? Tobacco comments:  ?  stopped at the age of  61  ?Vaping Use  ? Vaping Use: Never used  ?Substance and Sexual Activity  ? Alcohol use: Yes  ?  Alcohol/week: 6.0 standard drinks  ?  Types: 6 Standard drinks or equivalent per week  ? Drug use: No  ? Sexual activity: Not Currently  ?Other Topics Concern  ? Not on file  ?Social History Narrative  ? Not on file  ? ?Social Determinants of Health  ? ?Financial Resource Strain: Not on file  ?Food Insecurity: Not on file  ?Transportation Needs: Not on file  ?Physical Activity: Not on file  ?Stress: Not on file  ?Social Connections: Not on file  ? ? ?ECOG Status: ?2 - Symptomatic, <50% confined to bed ? ?Review of Systems: A 12 point ROS discussed and pertinent positives  are indicated in the HPI above.  All other systems are negative. ? ?Review of Systems  ?Constitutional:  Positive for activity change and fatigue. Negative for appetite change, chills, diaphoresis, fever and unexpected weight change.  ?Respiratory: Negative.    ?Cardiovascular: Negative.   ?Gastrointestinal: Negative.   ?Genitourinary: Negative.   ?Musculoskeletal: Negative.   ?Neurological: Negative.   ? ?Vital Signs: ?BP 127/60 (BP Location: Left Arm)   Pulse 77   SpO2 98%  ? ?Physical Exam ?Vitals reviewed.  ?Constitutional:   ?   General: He is not in acute distress. ?   Appearance: He is not toxic-appearing or diaphoretic.  ?Cardiovascular:  ?   Heart sounds: Normal heart sounds. No murmur heard. ?  No friction rub. No gallop.  ?   Comments: Pulse is regular  with some occasional pauses/dropped beats. ?Pulmonary:  ?   Effort: No respiratory distress.  ?   Breath sounds: Normal breath sounds. No stridor. No wheezing, rhonchi or rales.  ?Abdominal:  ?   General: Bowel sounds are normal.  ?   Palpations: Abdomen is soft.  ?   Tenderness: There is no abdominal tenderness. There is no guarding or rebound.  ?Musculoskeletal:  ?   Cervical back: Neck supple.  ?   Comments: Mild edema of lower legs/ankles, L>R.  ?Skin: ?   General: Skin is warm and dry.  ?Neurological:  ?   General: No focal deficit present.  ?   Mental Status: He is alert and oriented to person, place, and time.  ? ? ?Imaging: ?MR LIVER W WO CONTRAST ? ?Result Date: 05/26/2021 ?CLINICAL DATA:  Lymphoma, liver lesions EXAM: MRI ABDOMEN WITHOUT AND WITH CONTRAST TECHNIQUE: Multiplanar multisequence MR imaging of the abdomen was performed both before and after the administration of intravenous contrast. CONTRAST:  30m GADAVIST GADOBUTROL 1 MMOL/ML IV SOLN COMPARISON:  PET-CT 05/04/2021, MRI abdomen 10/26/2020 FINDINGS: Lower chest: No acute findings. Hepatobiliary: There are multiple mildly hyperintense T2 signal irregular/ill-defined masses identified in  the liver which demonstrate rim/surrounding enhancement most consistent with lymphomatous disease. The largest lesion is centered in segment 4B with multiple surrounding smaller masses, measuring up t

## 2021-06-16 ENCOUNTER — Telehealth: Payer: Self-pay | Admitting: Hematology

## 2021-06-16 NOTE — Telephone Encounter (Signed)
Cancelled upcoming appointment and scheduled follow-up appointment per 3/22 los. Patient is aware. ?

## 2021-06-17 ENCOUNTER — Encounter: Payer: Self-pay | Admitting: Family Medicine

## 2021-06-20 ENCOUNTER — Other Ambulatory Visit (HOSPITAL_COMMUNITY): Payer: Self-pay | Admitting: Interventional Radiology

## 2021-06-20 DIAGNOSIS — C249 Malignant neoplasm of biliary tract, unspecified: Secondary | ICD-10-CM

## 2021-06-20 DIAGNOSIS — C799 Secondary malignant neoplasm of unspecified site: Secondary | ICD-10-CM

## 2021-06-22 ENCOUNTER — Encounter: Payer: Self-pay | Admitting: Hematology

## 2021-06-22 NOTE — Progress Notes (Addendum)
? ? ?HEMATOLOGY/ONCOLOGY CLINIC VISIT NOTE ? ?Date of Service: 06/22/2021 ? ?Patient Care Team: ?Denita Lung, MD as PCP - General (Family Medicine) ?Brunetta Genera, MD as Consulting Physician (Hematology) ?Viona Gilmore, [facility] as Pharmacist (Pharmacist) ? ?CHIEF COMPLAINTS/PURPOSE OF CONSULTATION:  ?Follow-up for continued evaluation and management of mantle cell lymphoma and gallbladder adenocarcinoma ? ?HISTORY OF PRESENTING ILLNESS:  ?Please see previous HPI for details ? ?.DOXOrubicin (ADRIAMYCIN) chemo injection 48 mg, 25 mg/m2 = 48 mg (100 % of original dose 25 mg/m2), Intravenous,  Once, 6 of 6 cycles ? ?Dose modification: 25 mg/m2 (original dose 25 mg/m2, Cycle 1, Reason: Patient Age), 37.5 mg/m2 (original dose 25 mg/m2, Cycle 4, Reason: Provider Judgment) ? ?Administration: 48 mg (08/05/2018), 48 mg (08/26/2018), 48 mg (09/16/2018), 72 mg (10/07/2018), 72 mg (10/28/2018), 72 mg (11/18/2018) ? ? ? ?pegfilgrastim-cbqv (UDENYCA) injection 6 mg, 6 mg, Subcutaneous, Once, 6 of 6 cycles ? ?Administration: 6 mg (08/07/2018), 6 mg (08/28/2018), 6 mg (09/18/2018), 6 mg (10/09/2018), 6 mg (10/30/2018), 6 mg (11/20/2018) ? ? ? ?vinCRIStine (ONCOVIN) 1 mg in sodium chloride 0.9 % 50 mL chemo infusion, 1 mg (100 % of original dose 1 mg), Intravenous,  Once, 6 of 6 cycles ? ?Dose modification: 1 mg (original dose 1 mg, Cycle 1, Reason: Patient Age) ? ?Administration: 1 mg (08/05/2018), 1 mg (08/26/2018), 1 mg (09/16/2018), 1 mg (10/07/2018), 1 mg (10/28/2018), 1 mg (11/18/2018) ? ? ? ?riTUXimab (RITUXAN) 700 mg in sodium chloride 0.9 % 250 mL (2.1875 mg/mL) infusion, 375 mg/m2 = 700 mg, Intravenous,  Once, 1 of 1 cycle ? ?Administration: 700 mg (08/05/2018) ? ? ? ?cyclophosphamide (CYTOXAN) 780 mg in sodium chloride 0.9 % 250 mL chemo infusion, 400 mg/m2 = 780 mg (100 % of original dose 400 mg/m2), Intravenous,  Once, 6 of 6 cycles ? ?Dose modification: 400 mg/m2 (original dose 400 mg/m2, Cycle 1, Reason: Patient Age), 500 mg/m2  (original dose 400 mg/m2, Cycle 5, Reason: Provider Judgment) ? ?Administration: 780 mg (08/05/2018), 780 mg (08/26/2018), 780 mg (09/16/2018), 780 mg (10/07/2018), 960 mg (10/28/2018), 960 mg (11/18/2018) ? ? ? ?riTUXimab (RITUXAN) 700 mg in sodium chloride 0.9 % 180 mL infusion, 375 mg/m2 = 700 mg, Intravenous,  Once, 5 of 5 cycles ? ?Administration: 700 mg (08/26/2018), 700 mg (09/16/2018), 700 mg (10/07/2018), 700 mg (10/28/2018), 700 mg (11/18/2018) ? ?dexamethasone (DECADRON) 4 MG tablet, 8 mg, Oral, Daily, 1 of 1 cycle, Start date: 03/16/2020, End date: 09/03/2020 ? ? ? ?pegfilgrastim-cbqv (UDENYCA) injection 6 mg, 6 mg, Subcutaneous, Once, 5 of 5 cycles ? ?Administration: 6 mg (03/27/2020), 6 mg (04/24/2020), 6 mg (05/22/2020), 6 mg (06/21/2020), 6 mg (07/21/2020) ? ? ? ?bendamustine (BENDEKA) 125 mg in sodium chloride 0.9 % 50 mL (2.2727 mg/mL) chemo infusion, 70 mg/m2 = 125 mg (100 % of original dose 70 mg/m2), Intravenous,  Once, 5 of 5 cycles ? ?Dose modification: 70 mg/m2 (original dose 70 mg/m2, Cycle 1, Reason: Provider Judgment) ? ?Administration: 125 mg (03/25/2020), 125 mg (03/26/2020), 125 mg (04/22/2020), 125 mg (04/23/2020), 125 mg (05/20/2020), 125 mg (05/21/2020), 125 mg (06/17/2020), 125 mg (06/18/2020), 125 mg (07/19/2020), 125 mg (07/20/2020) ? ? ? ?riTUXimab-pvvr (RUXIENCE) 700 mg in sodium chloride 0.9 % 250 mL (2.1875 mg/mL) infusion, 375 mg/m2 = 700 mg, Intravenous,  Once, 2 of 2 cycles ? ?Administration: 700 mg (03/25/2020) ?  ?INTERVAL HISTORY:  ?  ?Mr. Mario Garcia is here for continued valuation and management of his mantle cell lymphoma and gallbladder adenocarcinoma. ?He has  started his a acalabrutinib 100 mg p.o. twice daily for his mantle cell lymphoma about a week ago and is tolerating this well without any issues. ?He had a biopsy of the largest lesion in his liver and this is consistent with metastatic gallbladder cancer.  These results were discussed in detail with the patient. ?Given he has multiple  liver lesions radiation therapy was not possible.  Patient was referred to IR Dr. Kathlene Cote and has been offered palliative treatment with transarterial Y90 beads. ? ?We discussed different treatment options including a combination of continuing his acalabrutinib and doing palliative Y90 beads to try to control his gallbladder metastases in the liver versus doing palliative chemotherapy with gem ox which will have antilymphoma and antigallbladder adenocarcinoma effects.  We however discussed that given his extensive previous chemotherapy he may not be able to tolerate full doses of chemotherapy enough to produce adequate response at this point. ? ?Labs done today were reviewed in detail with the patient. ? ?MEDICAL HISTORY:  ?Past Medical History:  ?Diagnosis Date  ? Arthritis   ? Cancer [facility])   ? PROSTATE  ? History of colonic polyps   ? History of kidney stones   ? Hypertension   ? Inguinal hernia 03/2002  ? Pneumonia   ? "years ago"  ? ? ?SURGICAL HISTORY: ?Past Surgical History:  ?Procedure Laterality Date  ? CATARACT EXTRACTION Bilateral   ? CHOLECYSTECTOMY N/A 12/14/2020  ? Procedure: LAPAROSCOPIC CHOLECYSTECTOMY;  Surgeon: Stark Klein, MD;  Location: [place];  Service: General;  Laterality: N/A;  ? COLONOSCOPY    ? DIRECT LARYNGOSCOPY N/A 07/08/2018  ? Procedure: DIRECT LARYNGOSCOPY;  Surgeon: Leta Baptist, MD;  Location: [place];  Service: ENT;  Laterality: N/A;  ? ESOPHAGOSCOPY N/A 07/08/2018  ? Procedure: ESOPHAGOSCOPY;  Surgeon: Leta Baptist, MD;  Location: [place];  Service: ENT;  Laterality: N/A;  ? EYE SURGERY    ? FLEXIBLE BRONCHOSCOPY N/A 07/08/2018  ? Procedure: FLEXIBLE BRONCHOSCOPY;  Surgeon: Leta Baptist, MD;  [facility];  Service: ENT;  Laterality: N/A;  ? HERNIA REPAIR Left   ? inguinal hernia  ? INGUINAL HERNIA REPAIR Right 09/03/2020  ? Procedure: OPEN RIGHT INGUINAL HERNIA REPAIR;  Surgeon: Clovis Riley, MD;  [facility]: [facility];  Service: General;  Laterality: Right;  ? IR IMAGING GUIDED PORT  INSERTION  07/30/2018  ? IR RADIOLOGIST EVAL & MGMT  06/15/2021  ? MASS BIOPSY Right 07/08/2018  ? Procedure: RIGHT NECK MASS EXCISIONAL BIOPSY;  Surgeon: Leta Baptist, MD;  Location: [place];  Service: ENT;  Laterality: Right;  ? PATELLA FRACTURE SURGERY Left   ? PROSTATE SURGERY  10/2005  ? RADIAL PROSTATECTOMY  ? ROTATOR CUFF REPAIR Bilateral   ? ? ?SOCIAL HISTORY: ?Social History  ? ?Socioeconomic History  ? Marital status: Married  ?  Spouse name: Not on file  ? Number of children: Not on file  ? Years of education: Not on file  ? Highest education level: Not on file  ?Occupational History  ? Not on file  ?Tobacco Use  ? Smoking status: Former  ? Smokeless tobacco: Never  ? Tobacco comments:  ?  stopped at the age of  36  ?Vaping Use  ? Vaping Use: Never used  ?Substance and Sexual Activity  ? Alcohol use: Yes  ?  Alcohol/week: 6.0 standard drinks  ?  Types: 6 Standard drinks or equivalent per week  ? Drug use: No  ? Sexual activity: Not Currently  ?Other Topics Concern  ?  Not on file  ?Social History Narrative  ? Not on file  ? ?Social Determinants of Health  ? ?Financial Resource Strain: Not on file  ?Food Insecurity: Not on file  ?Transportation Needs: Not on file  ?Physical Activity: Not on file  ?Stress: Not on file  ?Social Connections: Not on file  ?Intimate Partner Violence: Not on file  ? ? ?FAMILY HISTORY: ?Family History  ?Problem Relation Age of Onset  ? Cancer Father   ? Cancer Brother   ? ? ?ALLERGIES:  is allergic to lisinopril and penicillins. ? ?MEDICATIONS:  ?Current Outpatient Medications  ?Medication Sig Dispense Refill  ? Acalabrutinib Maleate (CALQUENCE) 100 MG TABS Take 100 mg by mouth 2 (two) times daily. 60 tablet 1  ? acetaminophen (TYLENOL) 500 MG tablet Take 1,000 mg by mouth every 6 (six) hours as needed for moderate pain.    ? ARTIFICIAL TEAR SOLUTION OP Place 1 drop into both eyes 2 (two) times daily.    ? calcium carbonate (TUMS - DOSED IN MG ELEMENTAL CALCIUM) 500 MG chewable tablet  Chew 2 tablets by mouth daily as needed for indigestion or heartburn.    ? latanoprost (XALATAN) 0.005 % ophthalmic solution Place 1 drop into both eyes at bedtime.    ? lidocaine-prilocaine (EMLA) cream Apply 1 applic

## 2021-06-27 ENCOUNTER — Ambulatory Visit: Payer: Medicare Other

## 2021-06-27 ENCOUNTER — Other Ambulatory Visit: Payer: Medicare Other

## 2021-06-27 ENCOUNTER — Ambulatory Visit: Payer: Medicare Other | Admitting: Hematology

## 2021-06-28 ENCOUNTER — Inpatient Hospital Stay: Payer: Medicare Other | Admitting: Hematology

## 2021-06-28 ENCOUNTER — Inpatient Hospital Stay: Payer: Medicare Other

## 2021-07-04 DIAGNOSIS — H26492 Other secondary cataract, left eye: Secondary | ICD-10-CM | POA: Diagnosis not present

## 2021-07-07 ENCOUNTER — Other Ambulatory Visit: Payer: Self-pay

## 2021-07-07 DIAGNOSIS — C8318 Mantle cell lymphoma, lymph nodes of multiple sites: Secondary | ICD-10-CM

## 2021-07-11 ENCOUNTER — Inpatient Hospital Stay: Payer: Medicare Other | Attending: Hematology | Admitting: Hematology

## 2021-07-11 ENCOUNTER — Inpatient Hospital Stay: Payer: Medicare Other

## 2021-07-11 ENCOUNTER — Other Ambulatory Visit: Payer: Self-pay

## 2021-07-11 ENCOUNTER — Telehealth: Payer: Self-pay

## 2021-07-11 VITALS — BP 109/58 | HR 74 | Temp 97.5°F | Resp 18 | Wt 145.1 lb

## 2021-07-11 DIAGNOSIS — C23 Malignant neoplasm of gallbladder: Secondary | ICD-10-CM | POA: Diagnosis not present

## 2021-07-11 DIAGNOSIS — C8318 Mantle cell lymphoma, lymph nodes of multiple sites: Secondary | ICD-10-CM

## 2021-07-11 DIAGNOSIS — C787 Secondary malignant neoplasm of liver and intrahepatic bile duct: Secondary | ICD-10-CM | POA: Diagnosis not present

## 2021-07-11 DIAGNOSIS — C8310 Mantle cell lymphoma, unspecified site: Secondary | ICD-10-CM | POA: Insufficient documentation

## 2021-07-11 DIAGNOSIS — Z20822 Contact with and (suspected) exposure to covid-19: Secondary | ICD-10-CM | POA: Diagnosis not present

## 2021-07-11 DIAGNOSIS — Z95828 Presence of other vascular implants and grafts: Secondary | ICD-10-CM

## 2021-07-11 LAB — CBC WITH DIFFERENTIAL (CANCER CENTER ONLY)
Abs Immature Granulocytes: 0.13 10*3/uL — ABNORMAL HIGH (ref 0.00–0.07)
Basophils Absolute: 0.1 10*3/uL (ref 0.0–0.1)
Basophils Relative: 1 %
Eosinophils Absolute: 0 10*3/uL (ref 0.0–0.5)
Eosinophils Relative: 0 %
HCT: 33 % — ABNORMAL LOW (ref 39.0–52.0)
Hemoglobin: 10.3 g/dL — ABNORMAL LOW (ref 13.0–17.0)
Immature Granulocytes: 1 %
Lymphocytes Relative: 53 %
Lymphs Abs: 5.9 10*3/uL — ABNORMAL HIGH (ref 0.7–4.0)
MCH: 28.7 pg (ref 26.0–34.0)
MCHC: 31.2 g/dL (ref 30.0–36.0)
MCV: 91.9 fL (ref 80.0–100.0)
Monocytes Absolute: 1.4 10*3/uL — ABNORMAL HIGH (ref 0.1–1.0)
Monocytes Relative: 13 %
Neutro Abs: 3.6 10*3/uL (ref 1.7–7.7)
Neutrophils Relative %: 32 %
Platelet Count: 146 10*3/uL — ABNORMAL LOW (ref 150–400)
RBC: 3.59 MIL/uL — ABNORMAL LOW (ref 4.22–5.81)
RDW: 14.8 % (ref 11.5–15.5)
WBC Count: 11.2 10*3/uL — ABNORMAL HIGH (ref 4.0–10.5)
nRBC: 0 % (ref 0.0–0.2)

## 2021-07-11 LAB — CMP (CANCER CENTER ONLY)
ALT: 26 U/L (ref 0–44)
AST: 54 U/L — ABNORMAL HIGH (ref 15–41)
Albumin: 3.3 g/dL — ABNORMAL LOW (ref 3.5–5.0)
Alkaline Phosphatase: 313 U/L — ABNORMAL HIGH (ref 38–126)
Anion gap: 13 (ref 5–15)
BUN: 32 mg/dL — ABNORMAL HIGH (ref 8–23)
CO2: 25 mmol/L (ref 22–32)
Calcium: 8.5 mg/dL — ABNORMAL LOW (ref 8.9–10.3)
Chloride: 101 mmol/L (ref 98–111)
Creatinine: 0.96 mg/dL (ref 0.61–1.24)
GFR, Estimated: >60 mL/min (ref >60)
Glucose, Bld: 87 mg/dL (ref 70–99)
Potassium: 4.4 mmol/L (ref 3.5–5.1)
Sodium: 139 mmol/L (ref 135–145)
Total Bilirubin: 0.5 mg/dL (ref 0.3–1.2)
Total Protein: 6.1 g/dL — ABNORMAL LOW (ref 6.5–8.1)

## 2021-07-11 LAB — LACTATE DEHYDROGENASE: LDH: 344 U/L — ABNORMAL HIGH (ref 98–192)

## 2021-07-11 MED ORDER — ONDANSETRON HCL 4 MG PO TABS: 4.0000 mg | ORAL_TABLET | Freq: Three times a day (TID) | ORAL | 0 refills | Status: AC | PRN

## 2021-07-11 MED ORDER — SODIUM CHLORIDE 0.9% FLUSH
10.0000 mL | INTRAVENOUS | Status: DC | PRN
  Administered 2021-07-11: 10 mL

## 2021-07-11 MED ORDER — HEPARIN SOD (PORK) LOCK FLUSH 100 UNIT/ML IV SOLN
500.0000 [IU] | Freq: Once | INTRAVENOUS | Status: AC | PRN
  Administered 2021-07-11: 500 [IU]

## 2021-07-11 MED ORDER — TRAMADOL HCL 50 MG PO TABS: 50.0000 mg | ORAL_TABLET | Freq: Three times a day (TID) | ORAL | 0 refills | Status: DC | PRN

## 2021-07-11 NOTE — Progress Notes (Signed)
? ? ?HEMATOLOGY/ONCOLOGY CLINIC VISIT NOTE ? ?Date of Service: 07/11/2021 ? ?Patient Care Team: ?Denita Lung, MD as PCP - General (Family Medicine) ?Brunetta Genera, MD as Consulting Physician (Hematology) ?Viona Gilmore, [facility] as Pharmacist (Pharmacist) ? ?CHIEF COMPLAINTS/PURPOSE OF CONSULTATION:  ?Follow-up for continued evaluation and management of mantle cell lymphoma and metastatic gallbladder adenocarcinoma ? ?HISTORY OF PRESENTING ILLNESS:  ?Please see previous HPI for details ? ?.DOXOrubicin (ADRIAMYCIN) chemo injection 48 mg, 25 mg/m2 = 48 mg (100 % of original dose 25 mg/m2), Intravenous,  Once, 6 of 6 cycles ? ?Dose modification: 25 mg/m2 (original dose 25 mg/m2, Cycle 1, Reason: Patient Age), 37.5 mg/m2 (original dose 25 mg/m2, Cycle 4, Reason: Provider Judgment) ? ?Administration: 48 mg (08/05/2018), 48 mg (08/26/2018), 48 mg (09/16/2018), 72 mg (10/07/2018), 72 mg (10/28/2018), 72 mg (11/18/2018) ? ? ? ?pegfilgrastim-cbqv (UDENYCA) injection 6 mg, 6 mg, Subcutaneous, Once, 6 of 6 cycles ? ?Administration: 6 mg (08/07/2018), 6 mg (08/28/2018), 6 mg (09/18/2018), 6 mg (10/09/2018), 6 mg (10/30/2018), 6 mg (11/20/2018) ? ? ? ?vinCRIStine (ONCOVIN) 1 mg in sodium chloride 0.9 % 50 mL chemo infusion, 1 mg (100 % of original dose 1 mg), Intravenous,  Once, 6 of 6 cycles ? ?Dose modification: 1 mg (original dose 1 mg, Cycle 1, Reason: Patient Age) ? ?Administration: 1 mg (08/05/2018), 1 mg (08/26/2018), 1 mg (09/16/2018), 1 mg (10/07/2018), 1 mg (10/28/2018), 1 mg (11/18/2018) ? ? ? ?riTUXimab (RITUXAN) 700 mg in sodium chloride 0.9 % 250 mL (2.1875 mg/mL) infusion, 375 mg/m2 = 700 mg, Intravenous,  Once, 1 of 1 cycle ? ?Administration: 700 mg (08/05/2018) ? ? ? ?cyclophosphamide (CYTOXAN) 780 mg in sodium chloride 0.9 % 250 mL chemo infusion, 400 mg/m2 = 780 mg (100 % of original dose 400 mg/m2), Intravenous,  Once, 6 of 6 cycles ? ?Dose modification: 400 mg/m2 (original dose 400 mg/m2, Cycle 1, Reason: Patient Age), 500  mg/m2 (original dose 400 mg/m2, Cycle 5, Reason: Provider Judgment) ? ?Administration: 780 mg (08/05/2018), 780 mg (08/26/2018), 780 mg (09/16/2018), 780 mg (10/07/2018), 960 mg (10/28/2018), 960 mg (11/18/2018) ? ? ? ?riTUXimab (RITUXAN) 700 mg in sodium chloride 0.9 % 180 mL infusion, 375 mg/m2 = 700 mg, Intravenous,  Once, 5 of 5 cycles ? ?Administration: 700 mg (08/26/2018), 700 mg (09/16/2018), 700 mg (10/07/2018), 700 mg (10/28/2018), 700 mg (11/18/2018) ? ?dexamethasone (DECADRON) 4 MG tablet, 8 mg, Oral, Daily, 1 of 1 cycle, Start date: 03/16/2020, End date: 09/03/2020 ? ? ? ?pegfilgrastim-cbqv (UDENYCA) injection 6 mg, 6 mg, Subcutaneous, Once, 5 of 5 cycles ? ?Administration: 6 mg (03/27/2020), 6 mg (04/24/2020), 6 mg (05/22/2020), 6 mg (06/21/2020), 6 mg (07/21/2020) ? ? ? ?bendamustine (BENDEKA) 125 mg in sodium chloride 0.9 % 50 mL (2.2727 mg/mL) chemo infusion, 70 mg/m2 = 125 mg (100 % of original dose 70 mg/m2), Intravenous,  Once, 5 of 5 cycles ? ?Dose modification: 70 mg/m2 (original dose 70 mg/m2, Cycle 1, Reason: Provider Judgment) ? ?Administration: 125 mg (03/25/2020), 125 mg (03/26/2020), 125 mg (04/22/2020), 125 mg (04/23/2020), 125 mg (05/20/2020), 125 mg (05/21/2020), 125 mg (06/17/2020), 125 mg (06/18/2020), 125 mg (07/19/2020), 125 mg (07/20/2020) ? ? ? ?riTUXimab-pvvr (RUXIENCE) 700 mg in sodium chloride 0.9 % 250 mL (2.1875 mg/mL) infusion, 375 mg/m2 = 700 mg, Intravenous,  Once, 2 of 2 cycles ? ?Administration: 700 mg (03/25/2020) ?  ?INTERVAL HISTORY:  ?  ?Mr. Jaystin Mcgarvey is here for follow-up of his mantle cell lymphoma and metastatic gallbladder carcinoma. ?He continues to  be on acalabrutinib 100 mg p.o. twice daily.  He notes no significant nausea or vomiting or diarrhea.  No significant issues with fluid retention. ?Notes some mild intermittent right upper quadrant discomfort which is controlled with Tylenol.  We discussed and he is okay with [place] sending a prescription for tramadol in case he has increased pain  from his gallbladder cancer metastases in the liver and also to limit the amount of Tylenol use. ?We discussed and reduced his statin to half the dose with the plan to discontinue this if indicated. ?We also discussed monitoring his blood pressure and if this is low due to his decreased p.o. intake that he might need dose reductions on his antihypertensives. ?He has lost about 4 pounds since his last clinic visit.  We discussed nutritional therapy referral.  He is trying to use Ensure and wants to hold off on the referral at this time. ?Labs done today were reviewed with him in detail. ?Notes grade 1 fatigue. ?No other significant prohibitive toxicities noted at this time. ?Wt Readings from Last 3 Encounters:  ?07/11/21 145 lb 1.6 oz (65.8 kg)  ?06/15/21 149 lb 14.4 oz (68 kg)  ?06/10/21 152 lb (68.9 kg)  ? ? ?MEDICAL HISTORY:  ?Past Medical History:  ?Diagnosis Date  ? Arthritis   ? Cancer [facility])   ? PROSTATE  ? History of colonic polyps   ? History of kidney stones   ? Hypertension   ? Inguinal hernia 03/2002  ? Pneumonia   ? "years ago"  ? ? ?SURGICAL HISTORY: ?Past Surgical History:  ?Procedure Laterality Date  ? CATARACT EXTRACTION Bilateral   ? CHOLECYSTECTOMY N/A 12/14/2020  ? Procedure: LAPAROSCOPIC CHOLECYSTECTOMY;  Surgeon: Stark Klein, MD;  Location: [place];  Service: General;  Laterality: N/A;  ? COLONOSCOPY    ? DIRECT LARYNGOSCOPY N/A 07/08/2018  ? Procedure: DIRECT LARYNGOSCOPY;  Surgeon: Leta Baptist, MD;  Location: [place];  Service: ENT;  Laterality: N/A;  ? ESOPHAGOSCOPY N/A 07/08/2018  ? Procedure: ESOPHAGOSCOPY;  Surgeon: Leta Baptist, MD;  Location: [place];  Service: ENT;  Laterality: N/A;  ? EYE SURGERY    ? FLEXIBLE BRONCHOSCOPY N/A 07/08/2018  ? Procedure: FLEXIBLE BRONCHOSCOPY;  Surgeon: Leta Baptist, MD;  [facility];  Service: ENT;  Laterality: N/A;  ? HERNIA REPAIR Left   ? inguinal hernia  ? INGUINAL HERNIA REPAIR Right 09/03/2020  ? Procedure: OPEN RIGHT INGUINAL HERNIA REPAIR;  Surgeon: Clovis Riley, MD;  [facility];  Service: General;  Laterality: Right;  ? IR IMAGING GUIDED PORT INSERTION  07/30/2018  ? IR RADIOLOGIST EVAL & MGMT  06/15/2021  ? MASS BIOPSY Right 07/08/2018  ? Procedure: RIGHT NECK MASS EXCISIONAL BIOPSY;  Surgeon: Leta Baptist, MD;  Location: [place];  Service: ENT;  Laterality: Right;  ? PATELLA FRACTURE SURGERY Left   ? PROSTATE SURGERY  10/2005  ? RADIAL PROSTATECTOMY  ? ROTATOR CUFF REPAIR Bilateral   ? ? ?SOCIAL HISTORY: ?Social History  ? ?Socioeconomic History  ? Marital status: Married  ?  Spouse name: Not on file  ? Number of children: Not on file  ? Years of education: Not on file  ? Highest education level: Not on file  ?Occupational History  ? Not on file  ?Tobacco Use  ? Smoking status: Former  ? Smokeless tobacco: Never  ? Tobacco comments:  ?  stopped at the age of  89  ?Vaping Use  ? Vaping Use: Never used  ?Substance and Sexual  Activity  ? Alcohol use: Yes  ?  Alcohol/week: 6.0 standard drinks  ?  Types: 6 Standard drinks or equivalent per week  ? Drug use: No  ? Sexual activity: Not Currently  ?Other Topics Concern  ? Not on file  ?Social History Narrative  ? Not on file  ? ?Social Determinants of Health  ? ?Financial Resource Strain: Not on file  ?Food Insecurity: Not on file  ?Transportation Needs: Not on file  ?Physical Activity: Not on file  ?Stress: Not on file  ?Social Connections: Not on file  ?Intimate Partner Violence: Not on file  ? ? ?FAMILY HISTORY: ?Family History  ?Problem Relation Age of Onset  ? Cancer Father   ? Cancer Brother   ? ? ?ALLERGIES:  is allergic to lisinopril and penicillins. ? ?MEDICATIONS:  ?Current Outpatient Medications  ?Medication Sig Dispense Refill  ? Acalabrutinib Maleate (CALQUENCE) 100 MG TABS Take 100 mg by mouth 2 (two) times daily. 60 tablet 1  ? acetaminophen (TYLENOL) 500 MG tablet Take 1,000 mg by mouth every 6 (six) hours as needed for moderate pain.    ? ARTIFICIAL TEAR SOLUTION OP Place 1 drop into both eyes 2 (two)  times daily.    ? calcium carbonate (TUMS - DOSED IN MG ELEMENTAL CALCIUM) 500 MG chewable tablet Chew 2 tablets by mouth daily as needed for indigestion or heartburn.    ? latanoprost (XALATAN) 0.005

## 2021-07-11 NOTE — Telephone Encounter (Signed)
Contacted pt per Dr Irene Limbo: to let Mario Garcia know that he recommends completely stopping his pravastatin in the context of his liver mets and slightly elevated liver functions.  Dr Irene Limbo  had told him to reduce the dose but given his CMP would completely hold it. ?Pt verbalized understanding and will hold pravastatin. ?

## 2021-07-12 ENCOUNTER — Telehealth: Payer: Self-pay | Admitting: Hematology

## 2021-07-12 DIAGNOSIS — Z20822 Contact with and (suspected) exposure to covid-19: Secondary | ICD-10-CM | POA: Diagnosis not present

## 2021-07-12 NOTE — Telephone Encounter (Signed)
Scheduled follow-up appointment per 4/14 los. Patient is aware. 

## 2021-07-14 ENCOUNTER — Other Ambulatory Visit: Payer: Self-pay | Admitting: Pharmacist

## 2021-07-14 ENCOUNTER — Other Ambulatory Visit (HOSPITAL_COMMUNITY): Payer: Self-pay

## 2021-07-14 ENCOUNTER — Other Ambulatory Visit: Payer: Self-pay | Admitting: Hematology

## 2021-07-14 ENCOUNTER — Other Ambulatory Visit: Payer: Self-pay

## 2021-07-14 DIAGNOSIS — C8318 Mantle cell lymphoma, lymph nodes of multiple sites: Secondary | ICD-10-CM

## 2021-07-14 MED ORDER — CALQUENCE 100 MG PO TABS
100.0000 mg | ORAL_TABLET | Freq: Two times a day (BID) | ORAL | 3 refills | Status: DC
  Filled 2021-07-14: qty 60, fill #0

## 2021-07-14 MED ORDER — CALQUENCE 100 MG PO TABS: 100.0000 mg | ORAL_TABLET | Freq: Two times a day (BID) | ORAL | 3 refills | Status: DC

## 2021-07-14 NOTE — Progress Notes (Signed)
Oral Oncology Pharmacist Encounter ? ?Prescription refill for Calquence (acalabrutinib) sent to [facility] in error. Patient enrolled in manufacturer assistance and receives medication through [employer]. Prescription redirected to [facility] for dispensing. ? ?Leron Croak, PharmD, BCPS ?Hematology/Oncology Clinical Pharmacist ?[facility] ?[phone] ?07/14/2021 1:41 PM ? ? ? ?

## 2021-07-25 ENCOUNTER — Ambulatory Visit (INDEPENDENT_AMBULATORY_CARE_PROVIDER_SITE_OTHER): Payer: Medicare Other | Admitting: Family Medicine

## 2021-07-25 ENCOUNTER — Encounter: Payer: Self-pay | Admitting: Family Medicine

## 2021-07-25 VITALS — BP 112/66 | HR 76 | Temp 98.2°F | Wt 148.6 lb

## 2021-07-25 DIAGNOSIS — C8318 Mantle cell lymphoma, lymph nodes of multiple sites: Secondary | ICD-10-CM

## 2021-07-25 DIAGNOSIS — M461 Sacroiliitis, not elsewhere classified: Secondary | ICD-10-CM | POA: Diagnosis not present

## 2021-07-25 DIAGNOSIS — C23 Malignant neoplasm of gallbladder: Secondary | ICD-10-CM | POA: Diagnosis not present

## 2021-07-25 NOTE — Progress Notes (Signed)
? ?  Subjective:  ? ? Patient ID: Mario Garcia, male    DOB: Jul 29, 1931, 88 y.o.   MRN: [id number] ? ?HPI ?He complains of a several day history of left hip pain.  No history of injury however he did do a little bit more walking up and down stairs than normal last weekend.  The pain is in the left hip and does radiate into the lateral thigh area.  No other joints or bone pain is elicited.  It does hurt worse when he sits.  He does use tramadol for general aches and pains.  He has a history of mantle cell lymphoma and most recently had gallbladder cancer with metastasized to the liver this now being treated. ? ? ?Review of Systems ? ?   ?Objective:  ? Physical Exam ?Alert and in no distress.  Full motion of the hip is noted without pain.  Tender to palpation over the left lower SI joint area with FABER testing being positive. ? ? ? ?   ?Assessment & Plan:  ?Sacroiliitis ([place]) ? ?Gallbladder cancer ([place]) ? ?Mantle cell lymphoma of lymph nodes of [facility]) ?Heat for 20 minutes 3 times per day take what ever pain medication works the best for you.  Gentle stretching after the heat ?Recommend he use tramadol on an as-needed basis.  If he continues have difficulty further evaluation including x-rays will be done.  He was comfortable with that. ?

## 2021-07-25 NOTE — Patient Instructions (Signed)
Heat for 20 minutes 3 times per day take what ever pain medication works the best for you.  Gentle stretching after the heat ?

## 2021-07-26 ENCOUNTER — Other Ambulatory Visit: Payer: Self-pay

## 2021-07-26 ENCOUNTER — Telehealth: Payer: Self-pay

## 2021-07-26 DIAGNOSIS — C8318 Mantle cell lymphoma, lymph nodes of multiple sites: Secondary | ICD-10-CM

## 2021-07-26 NOTE — Telephone Encounter (Signed)
Returned call to pt regarding his edema in lower legs, ankles and feet. Pt states the edema does not decrease much at night when his legs are elevated. Pt states his legs and feet are not painful. Per Dr Irene Limbo pt to come in and be evaluated by symptom management. Pt agreeable and appointment made for tomorrow.  ?

## 2021-07-27 ENCOUNTER — Other Ambulatory Visit (HOSPITAL_COMMUNITY): Payer: Self-pay | Admitting: Physician Assistant

## 2021-07-27 ENCOUNTER — Inpatient Hospital Stay: Payer: Medicare Other | Attending: Hematology

## 2021-07-27 ENCOUNTER — Ambulatory Visit (HOSPITAL_COMMUNITY)
Admission: RE | Admit: 2021-07-27 | Discharge: 2021-07-27 | Disposition: A | Discharge status: Home / Self Care | Payer: Medicare Other | Source: Ambulatory Visit | Attending: Physician Assistant | Admitting: Physician Assistant

## 2021-07-27 ENCOUNTER — Other Ambulatory Visit: Payer: Self-pay

## 2021-07-27 ENCOUNTER — Inpatient Hospital Stay: Payer: Medicare Other

## 2021-07-27 ENCOUNTER — Other Ambulatory Visit: Payer: Self-pay | Admitting: Radiology

## 2021-07-27 ENCOUNTER — Inpatient Hospital Stay (HOSPITAL_BASED_OUTPATIENT_CLINIC_OR_DEPARTMENT_OTHER): Payer: Medicare Other | Admitting: Physician Assistant

## 2021-07-27 VITALS — BP 126/63 | HR 69 | Temp 98.2°F | Resp 18 | Wt 149.1 lb

## 2021-07-27 DIAGNOSIS — R609 Edema, unspecified: Secondary | ICD-10-CM | POA: Insufficient documentation

## 2021-07-27 DIAGNOSIS — C249 Malignant neoplasm of biliary tract, unspecified: Secondary | ICD-10-CM | POA: Insufficient documentation

## 2021-07-27 DIAGNOSIS — C8318 Mantle cell lymphoma, lymph nodes of multiple sites: Secondary | ICD-10-CM

## 2021-07-27 DIAGNOSIS — D649 Anemia, unspecified: Secondary | ICD-10-CM

## 2021-07-27 DIAGNOSIS — D696 Thrombocytopenia, unspecified: Secondary | ICD-10-CM

## 2021-07-27 DIAGNOSIS — Z95828 Presence of other vascular implants and grafts: Secondary | ICD-10-CM

## 2021-07-27 DIAGNOSIS — R600 Localized edema: Secondary | ICD-10-CM | POA: Insufficient documentation

## 2021-07-27 DIAGNOSIS — Z8546 Personal history of malignant neoplasm of prostate: Secondary | ICD-10-CM | POA: Diagnosis not present

## 2021-07-27 DIAGNOSIS — C23 Malignant neoplasm of gallbladder: Secondary | ICD-10-CM | POA: Diagnosis not present

## 2021-07-27 DIAGNOSIS — E785 Hyperlipidemia, unspecified: Secondary | ICD-10-CM | POA: Insufficient documentation

## 2021-07-27 DIAGNOSIS — R0602 Shortness of breath: Secondary | ICD-10-CM | POA: Insufficient documentation

## 2021-07-27 DIAGNOSIS — Z8572 Personal history of non-Hodgkin lymphomas: Secondary | ICD-10-CM | POA: Diagnosis not present

## 2021-07-27 DIAGNOSIS — C8310 Mantle cell lymphoma, unspecified site: Secondary | ICD-10-CM | POA: Insufficient documentation

## 2021-07-27 DIAGNOSIS — Z87891 Personal history of nicotine dependence: Secondary | ICD-10-CM | POA: Diagnosis not present

## 2021-07-27 DIAGNOSIS — M1990 Unspecified osteoarthritis, unspecified site: Secondary | ICD-10-CM | POA: Insufficient documentation

## 2021-07-27 DIAGNOSIS — C787 Secondary malignant neoplasm of liver and intrahepatic bile duct: Secondary | ICD-10-CM | POA: Insufficient documentation

## 2021-07-27 DIAGNOSIS — Z8719 Personal history of other diseases of the digestive system: Secondary | ICD-10-CM | POA: Insufficient documentation

## 2021-07-27 DIAGNOSIS — C8331 Diffuse large B-cell lymphoma, lymph nodes of head, face, and neck: Secondary | ICD-10-CM

## 2021-07-27 DIAGNOSIS — I10 Essential (primary) hypertension: Secondary | ICD-10-CM | POA: Insufficient documentation

## 2021-07-27 DIAGNOSIS — Z9049 Acquired absence of other specified parts of digestive tract: Secondary | ICD-10-CM | POA: Diagnosis not present

## 2021-07-27 DIAGNOSIS — R5383 Other fatigue: Secondary | ICD-10-CM | POA: Diagnosis not present

## 2021-07-27 DIAGNOSIS — C8590 Non-Hodgkin lymphoma, unspecified, unspecified site: Secondary | ICD-10-CM | POA: Diagnosis not present

## 2021-07-27 LAB — CMP (CANCER CENTER ONLY)
ALT: 37 U/L (ref 0–44)
AST: 84 U/L — ABNORMAL HIGH (ref 15–41)
Albumin: 2.9 g/dL — ABNORMAL LOW (ref 3.5–5.0)
Alkaline Phosphatase: 398 U/L — ABNORMAL HIGH (ref 38–126)
Anion gap: 14 (ref 5–15)
BUN: 27 mg/dL — ABNORMAL HIGH (ref 8–23)
CO2: 24 mmol/L (ref 22–32)
Calcium: 8.5 mg/dL — ABNORMAL LOW (ref 8.9–10.3)
Chloride: 99 mmol/L (ref 98–111)
Creatinine: 0.9 mg/dL (ref 0.61–1.24)
GFR, Estimated: >60 mL/min (ref >60)
Glucose, Bld: 86 mg/dL (ref 70–99)
Potassium: 4.5 mmol/L (ref 3.5–5.1)
Sodium: 137 mmol/L (ref 135–145)
Total Bilirubin: 0.6 mg/dL (ref 0.3–1.2)
Total Protein: 5.8 g/dL — ABNORMAL LOW (ref 6.5–8.1)

## 2021-07-27 LAB — CBC WITH DIFFERENTIAL (CANCER CENTER ONLY)
Abs Immature Granulocytes: 0.16 10*3/uL — ABNORMAL HIGH (ref 0.00–0.07)
Basophils Absolute: 0.1 10*3/uL (ref 0.0–0.1)
Basophils Relative: 1 %
Eosinophils Absolute: 0 10*3/uL (ref 0.0–0.5)
Eosinophils Relative: 0 %
HCT: 30.9 % — ABNORMAL LOW (ref 39.0–52.0)
Hemoglobin: 9.8 g/dL — ABNORMAL LOW (ref 13.0–17.0)
Immature Granulocytes: 1 %
Lymphocytes Relative: 47 %
Lymphs Abs: 5.2 10*3/uL — ABNORMAL HIGH (ref 0.7–4.0)
MCH: 29.5 pg (ref 26.0–34.0)
MCHC: 31.7 g/dL (ref 30.0–36.0)
MCV: 93.1 fL (ref 80.0–100.0)
Monocytes Absolute: 1.5 10*3/uL — ABNORMAL HIGH (ref 0.1–1.0)
Monocytes Relative: 14 %
Neutro Abs: 4.2 10*3/uL (ref 1.7–7.7)
Neutrophils Relative %: 37 %
Platelet Count: 126 10*3/uL — ABNORMAL LOW (ref 150–400)
RBC: 3.32 MIL/uL — ABNORMAL LOW (ref 4.22–5.81)
RDW: 16.1 % — ABNORMAL HIGH (ref 11.5–15.5)
WBC Count: 11.2 10*3/uL — ABNORMAL HIGH (ref 4.0–10.5)
nRBC: 0 % (ref 0.0–0.2)

## 2021-07-27 LAB — BRAIN NATRIURETIC PEPTIDE: B Natriuretic Peptide: 171.4 pg/mL — ABNORMAL HIGH (ref 0.0–100.0)

## 2021-07-27 LAB — LACTATE DEHYDROGENASE: LDH: 439 U/L — ABNORMAL HIGH (ref 98–192)

## 2021-07-27 MED ORDER — SODIUM CHLORIDE 0.9% FLUSH
10.0000 mL | INTRAVENOUS | Status: DC | PRN
  Administered 2021-07-27: 10 mL

## 2021-07-27 MED ORDER — FUROSEMIDE 10 MG/ML IJ SOLN
20.0000 mg | Freq: Once | INTRAMUSCULAR | Status: AC
  Administered 2021-07-27: 20 mg via INTRAVENOUS
  Filled 2021-07-27: qty 2

## 2021-07-27 MED ORDER — HEPARIN SOD (PORK) LOCK FLUSH 100 UNIT/ML IV SOLN
500.0000 [IU] | Freq: Once | INTRAVENOUS | Status: AC | PRN
  Administered 2021-07-27: 500 [IU]

## 2021-07-27 MED ORDER — FUROSEMIDE 10 MG/ML IJ SOLN: 20.0000 mg | Freq: Once | INTRAMUSCULAR | Status: AC

## 2021-07-27 MED ORDER — FUROSEMIDE 20 MG PO TABS: 20.0000 mg | ORAL_TABLET | Freq: Every day | ORAL | 0 refills | Status: DC

## 2021-07-27 NOTE — Progress Notes (Signed)
? ? ? ?Symptom Management Consult note ?[place]   ? ?Patient Care Team: ?Denita Lung, MD as PCP - General (Family Medicine) ?Brunetta Genera, MD as Consulting Physician (Hematology) ?Viona Gilmore, [facility] as Pharmacist (Pharmacist)  ? ? ?Name of the patient: Mario Garcia  [id number]  03-Mar-1932  ? ?Date of visit: 07/27/2021  ? ? ?Chief complaint/ Reason for visit- leg swelling ? ?Oncology History  ?Diffuse large B-cell lymphoma of lymph nodes of neck (HCC)  ?07/26/2018 Initial Diagnosis  ? Diffuse large B-cell lymphoma of lymph nodes of neck (HCC) ? ?  ?08/05/2018 - 11/20/2018 Chemotherapy  ? The patient had DOXOrubicin (ADRIAMYCIN) chemo injection 48 mg, 25 mg/m2 = 48 mg (100 % of original dose 25 mg/m2), Intravenous,  Once, 6 of 6 cycles ?Dose modification: 25 mg/m2 (original dose 25 mg/m2, Cycle 1, Reason: Patient Age), 37.5 mg/m2 (original dose 25 mg/m2, Cycle 4, Reason: Provider Judgment) ?Administration: 48 mg (08/05/2018), 48 mg (08/26/2018), 48 mg (09/16/2018), 72 mg (10/07/2018), 72 mg (10/28/2018), 72 mg (11/18/2018) ?palonosetron (ALOXI) injection 0.25 mg, 0.25 mg, Intravenous,  Once, 6 of 6 cycles ?Administration: 0.25 mg (08/05/2018), 0.25 mg (08/26/2018), 0.25 mg (09/16/2018), 0.25 mg (10/07/2018), 0.25 mg (10/28/2018), 0.25 mg (11/18/2018) ?pegfilgrastim-cbqv (UDENYCA) injection 6 mg, 6 mg, Subcutaneous, Once, 6 of 6 cycles ?Administration: 6 mg (08/07/2018), 6 mg (08/28/2018), 6 mg (09/18/2018), 6 mg (10/09/2018), 6 mg (10/30/2018), 6 mg (11/20/2018) ?vinCRIStine (ONCOVIN) 1 mg in sodium chloride 0.9 % 50 mL chemo infusion, 1 mg (100 % of original dose 1 mg), Intravenous,  Once, 6 of 6 cycles ?Dose modification: 1 mg (original dose 1 mg, Cycle 1, Reason: Patient Age) ?Administration: 1 mg (08/05/2018), 1 mg (08/26/2018), 1 mg (09/16/2018), 1 mg (10/07/2018), 1 mg (10/28/2018), 1 mg (11/18/2018) ?riTUXimab (RITUXAN) 700 mg in sodium chloride 0.9 % 250 mL (2.1875 mg/mL) infusion, 375 mg/m2 = 700 mg, Intravenous,   Once, 1 of 1 cycle ?Administration: 700 mg (08/05/2018) ?cyclophosphamide (CYTOXAN) 780 mg in sodium chloride 0.9 % 250 mL chemo infusion, 400 mg/m2 = 780 mg (100 % of original dose 400 mg/m2), Intravenous,  Once, 6 of 6 cycles ?Dose modification: 400 mg/m2 (original dose 400 mg/m2, Cycle 1, Reason: Patient Age), 500 mg/m2 (original dose 400 mg/m2, Cycle 5, Reason: Provider Judgment) ?Administration: 780 mg (08/05/2018), 780 mg (08/26/2018), 780 mg (09/16/2018), 780 mg (10/07/2018), 960 mg (10/28/2018), 960 mg (11/18/2018) ?riTUXimab (RITUXAN) 700 mg in sodium chloride 0.9 % 180 mL infusion, 375 mg/m2 = 700 mg, Intravenous,  Once, 5 of 5 cycles ?Administration: 700 mg (08/26/2018), 700 mg (09/16/2018), 700 mg (10/07/2018), 700 mg (10/28/2018), 700 mg (11/18/2018) ? ? for chemotherapy treatment.  ? ?  ?10/18/2020 -  Chemotherapy  ? Patient is on Treatment Plan : NON-HODGKINS LYMPHOMA Rituximab q60d Maintenance  ? ?  ?  ?Mantle cell lymphoma ([place])  ?03/16/2020 Initial Diagnosis  ? Mantle cell lymphoma ([place]) ? ?  ?03/25/2020 - 07/21/2020 Chemotherapy  ?  ? ?  ? ?  ? ? ?Current Therapy: Acalabrutinib PO ? ?Interval history- Mario Garcia is an 88 yo male with oncologic history as listed above presenting to [facility] today with chief complaint of legs swelling x 2 weeks that has been progressively worsening.  He denies any associated pain.  He states the swelling is worse after walking. He just spent time at the beach and was able to ambulate without difficulty. He usually sleep in a recliner chair because of sciatic pain. He saw pcp for  sciatica recently 07/25/21.  He denies any new medications. Takes tramadol for hip pain prn. He admits to feeling fatigued ever since starting his oral chemo. His spouse contributes to history as well. She reports patient is short of breath after walking up and down their driveway approximately 300 feet and has to stop and catch his breath once he gets to the house. This has not acutely worsened she thinks.  He denies any injury or trauma to his legs. Denies any redness. Denies fever, chills, chest pain, abdominal pain, nausea, emesis, rash. Has never been evaluated by cardiology in the past. ? ? ? ? ?ROS  ?All other systems are reviewed and are negative for acute change except as noted in the HPI. ? ? ? ?Allergies  ?Allergen Reactions  ? Lisinopril Cough  ? Penicillins Rash  ?  Did it involve swelling of the face/tongue/throat, SOB, or low BP? No ?Did it involve sudden or severe rash/hives, skin peeling, or any reaction on the inside of your mouth or nose? Yes ?Did you need to seek medical attention at a hospital or doctor's office? Yes ?When did it last happen?   50  years ?If all above answers are "NO", may proceed with cephalosporin use.  ? ? ? ?Past Medical History:  ?Diagnosis Date  ? Arthritis   ? Cancer [facility])   ? PROSTATE  ? History of colonic polyps   ? History of kidney stones   ? Hypertension   ? Inguinal hernia 03/2002  ? Pneumonia   ? "years ago"  ? ? ? ?Past Surgical History:  ?Procedure Laterality Date  ? CATARACT EXTRACTION Bilateral   ? CHOLECYSTECTOMY N/A 12/14/2020  ? Procedure: LAPAROSCOPIC CHOLECYSTECTOMY;  Surgeon: Stark Klein, MD;  Location: [facility];  Service: General;  Laterality: N/A;  ? COLONOSCOPY    ? DIRECT LARYNGOSCOPY N/A 07/08/2018  ? Procedure: DIRECT LARYNGOSCOPY;  Surgeon: Leta Baptist, MD;  Location: [facility];  Service: ENT;  Laterality: N/A;  ? ESOPHAGOSCOPY N/A 07/08/2018  ? Procedure: ESOPHAGOSCOPY;  Surgeon: Leta Baptist, MD;  Location: [place];  Service: ENT;  Laterality: N/A;  ? EYE SURGERY    ? FLEXIBLE BRONCHOSCOPY N/A 07/08/2018  ? Procedure: FLEXIBLE BRONCHOSCOPY;  Surgeon: Leta Baptist, MD;  Location: [facility];  Service: ENT;  Laterality: N/A;  ? HERNIA REPAIR Left   ? inguinal hernia  ? INGUINAL HERNIA REPAIR Right 09/03/2020  ? Procedure: OPEN RIGHT INGUINAL HERNIA REPAIR;  Surgeon: Clovis Riley, MD;  Location: [facility];  Service: General;  Laterality: Right;  ? IR IMAGING GUIDED PORT  INSERTION  07/30/2018  ? IR RADIOLOGIST EVAL & MGMT  06/15/2021  ? MASS BIOPSY Right 07/08/2018  ? Procedure: RIGHT NECK MASS EXCISIONAL BIOPSY;  Surgeon: Leta Baptist, MD;  Location: [place];  Service: ENT;  Laterality: Right;  ? PATELLA FRACTURE SURGERY Left   ? PROSTATE SURGERY  10/2005  ? RADIAL PROSTATECTOMY  ? ROTATOR CUFF REPAIR Bilateral   ? ? ?Social History  ? ?Socioeconomic History  ? Marital status: Married  ?  Spouse name: Not on file  ? Number of children: Not on file  ? Years of education: Not on file  ? Highest education level: Not on file  ?Occupational History  ? Not on file  ?Tobacco Use  ? Smoking status: Former  ? Smokeless tobacco: Never  ? Tobacco comments:  ?  stopped at the age of  63  ?Vaping Use  ? Vaping Use: Never used  ?Substance and  Sexual Activity  ? Alcohol use: Yes  ?  Alcohol/week: 6.0 standard drinks  ?  Types: 6 Standard drinks or equivalent per week  ? Drug use: No  ? Sexual activity: Not Currently  ?Other Topics Concern  ? Not on file  ?Social History Narrative  ? Not on file  ? ?Social Determinants of Health  ? ?Financial Resource Strain: Not on file  ?Food Insecurity: Not on file  ?Transportation Needs: Not on file  ?Physical Activity: Not on file  ?Stress: Not on file  ?Social Connections: Not on file  ?Intimate Partner Violence: Not on file  ? ? ?Family History  ?Problem Relation Age of Onset  ? Cancer Father   ? Cancer Brother   ? ? ? ?Current Outpatient Medications:  ?  furosemide (LASIX) 20 MG tablet, Take 1 tablet (20 mg total) by mouth daily for 7 days., Disp: 7 tablet, Rfl: 0 ?  Acalabrutinib Maleate (CALQUENCE) 100 MG TABS, Take 100 mg by mouth 2 (two) times daily., Disp: 60 tablet, Rfl: 3 ?  acetaminophen (TYLENOL) 500 MG tablet, Take 1,000 mg by mouth every 6 (six) hours as needed for moderate pain., Disp: , Rfl:  ?  ARTIFICIAL TEAR SOLUTION OP, Place 1 drop into both eyes 2 (two) times daily., Disp: , Rfl:  ?  calcium carbonate (TUMS - DOSED IN MG ELEMENTAL CALCIUM) 500  MG chewable tablet, Chew 2 tablets by mouth daily as needed for indigestion or heartburn., Disp: , Rfl:  ?  latanoprost (XALATAN) 0.005 % ophthalmic solution, Place 1 drop into both eyes at bedtime., Disp

## 2021-07-27 NOTE — Patient Instructions (Signed)
Peripheral Edema  Peripheral edema is swelling that is caused by a buildup of fluid. Peripheral edema most often affects the lower legs, ankles, and feet. It can also develop in the arms, hands, and face. The area of the body that has peripheral edema will look swollen. It may also feel heavy or warm. Your clothes may start to feel tight. Pressing on the area may make a temporary dent in your skin (pitting edema). You may not be able to move your swollen arm or leg as much as usual. There are many causes of peripheral edema. It can happen because of a complication of other conditions such as heart failure, kidney disease, or a problem with your circulation. It also can be a side effect of certain medicines or happen because of an infection. It often happens to women during pregnancy. Sometimes, the cause is not known. Follow these instructions at home: Managing pain, stiffness, and swelling  Raise (elevate) your legs while you are sitting or lying down. Move around often to prevent stiffness and to reduce swelling. Do not sit or stand for long periods of time. Do not wear tight clothing. Do not wear garters on your upper legs. Exercise your legs to get your circulation going. This helps to move the fluid back into your blood vessels, and it may help the swelling go down. Wear compression stockings as told by your health care provider. These stockings help to prevent blood clots and reduce swelling in your legs. It is important that these are the correct size. These stockings should be prescribed by your doctor to prevent possible injuries. If elastic bandages or wraps are recommended, use them as told by your health care provider. Medicines Take over-the-counter and prescription medicines only as told by your health care provider. Your health care provider may prescribe medicine to help your body get rid of excess water (diuretic). Take this medicine if you are told to take it. General  instructions Eat a low-salt (low-sodium) diet as told by your health care provider. Sometimes, eating less salt may reduce swelling. Pay attention to any changes in your symptoms. Moisturize your skin daily to help prevent skin from cracking and draining. Keep all follow-up visits. This is important. Contact a health care provider if: You have a fever. You have swelling in only one leg. You have increased swelling, redness, or pain in one or both of your legs. You have drainage or sores at the area where you have edema. Get help right away if: You have edema that starts suddenly or is getting worse, especially if you are pregnant or have a medical condition. You develop shortness of breath, especially when you are lying down. You have pain in your chest or abdomen. You feel weak. You feel like you will faint. These symptoms may be an emergency. Get help right away. Call 911. Do not wait to see if the symptoms will go away. Do not drive yourself to the hospital. Summary Peripheral edema is swelling that is caused by a buildup of fluid. Peripheral edema most often affects the lower legs, ankles, and feet. Move around often to prevent stiffness and to reduce swelling. Do not sit or stand for long periods of time. Pay attention to any changes in your symptoms. Contact a health care provider if you have edema that starts suddenly or is getting worse, especially if you are pregnant or have a medical condition. Get help right away if you develop shortness of breath, especially when lying down.   This information is not intended to replace advice given to you by your health care provider. Make sure you discuss any questions you have with your health care provider. Document Revised: 11/15/2020 Document Reviewed: 11/15/2020 Elsevier Patient Education  2023 Elsevier Inc.  

## 2021-07-27 NOTE — Patient Instructions (Signed)
-  Monitor your blood pressure while taking Lasix. If you blood pressure continues to be normal do not task your blood pressure medication as we discussed. ? ?-If symptoms worsen please call the [facility] or if needing immediate medical care call 911 or go to the emergency department. ?

## 2021-07-28 ENCOUNTER — Encounter (HOSPITAL_COMMUNITY): Payer: Self-pay

## 2021-07-28 ENCOUNTER — Other Ambulatory Visit (HOSPITAL_COMMUNITY): Payer: Self-pay | Admitting: Interventional Radiology

## 2021-07-28 ENCOUNTER — Ambulatory Visit (HOSPITAL_COMMUNITY)
Admission: RE | Admit: 2021-07-28 | Discharge: 2021-07-28 | Disposition: A | Discharge status: Home / Self Care | Payer: Medicare Other | Source: Ambulatory Visit | Attending: Interventional Radiology | Admitting: Interventional Radiology

## 2021-07-28 ENCOUNTER — Other Ambulatory Visit: Payer: Self-pay

## 2021-07-28 ENCOUNTER — Encounter (HOSPITAL_COMMUNITY)
Admission: RE | Admit: 2021-07-28 | Discharge: 2021-07-28 | Disposition: A | Discharge status: Home / Self Care | Payer: Medicare Other | Source: Ambulatory Visit | Attending: Interventional Radiology | Admitting: Interventional Radiology

## 2021-07-28 DIAGNOSIS — C799 Secondary malignant neoplasm of unspecified site: Secondary | ICD-10-CM | POA: Insufficient documentation

## 2021-07-28 DIAGNOSIS — C249 Malignant neoplasm of biliary tract, unspecified: Secondary | ICD-10-CM

## 2021-07-28 DIAGNOSIS — R609 Edema, unspecified: Secondary | ICD-10-CM | POA: Diagnosis not present

## 2021-07-28 DIAGNOSIS — R0602 Shortness of breath: Secondary | ICD-10-CM | POA: Diagnosis not present

## 2021-07-28 DIAGNOSIS — C7989 Secondary malignant neoplasm of other specified sites: Secondary | ICD-10-CM | POA: Diagnosis not present

## 2021-07-28 DIAGNOSIS — C221 Intrahepatic bile duct carcinoma: Secondary | ICD-10-CM | POA: Diagnosis not present

## 2021-07-28 DIAGNOSIS — M1990 Unspecified osteoarthritis, unspecified site: Secondary | ICD-10-CM | POA: Diagnosis not present

## 2021-07-28 DIAGNOSIS — C23 Malignant neoplasm of gallbladder: Secondary | ICD-10-CM | POA: Diagnosis not present

## 2021-07-28 DIAGNOSIS — C787 Secondary malignant neoplasm of liver and intrahepatic bile duct: Secondary | ICD-10-CM | POA: Diagnosis not present

## 2021-07-28 HISTORY — PX: IR 3D INDEPENDENT WKST: IMG2385

## 2021-07-28 HISTORY — PX: IR ANGIOGRAM VISCERAL SELECTIVE: IMG657

## 2021-07-28 HISTORY — PX: IR EMBO TUMOR ORGAN ISCHEMIA INFARCT INC GUIDE ROADMAPPING: IMG5449

## 2021-07-28 HISTORY — PX: IR US GUIDE VASC ACCESS RIGHT: IMG2390

## 2021-07-28 HISTORY — PX: IR ANGIOGRAM SELECTIVE EACH ADDITIONAL VESSEL: IMG667

## 2021-07-28 LAB — CBC WITH DIFFERENTIAL/PLATELET
Abs Immature Granulocytes: 0.14 10*3/uL — ABNORMAL HIGH (ref 0.00–0.07)
Basophils Absolute: 0 10*3/uL (ref 0.0–0.1)
Basophils Relative: 0 %
Eosinophils Absolute: 0 10*3/uL (ref 0.0–0.5)
Eosinophils Relative: 0 %
HCT: 32.1 % — ABNORMAL LOW (ref 39.0–52.0)
Hemoglobin: 9.7 g/dL — ABNORMAL LOW (ref 13.0–17.0)
Immature Granulocytes: 1 %
Lymphocytes Relative: 48 %
Lymphs Abs: 4.8 10*3/uL — ABNORMAL HIGH (ref 0.7–4.0)
MCH: 28.8 pg (ref 26.0–34.0)
MCHC: 30.2 g/dL (ref 30.0–36.0)
MCV: 95.3 fL (ref 80.0–100.0)
Monocytes Absolute: 1.4 10*3/uL — ABNORMAL HIGH (ref 0.1–1.0)
Monocytes Relative: 14 %
Neutro Abs: 3.6 10*3/uL (ref 1.7–7.7)
Neutrophils Relative %: 37 %
Platelets: 141 10*3/uL — ABNORMAL LOW (ref 150–400)
RBC: 3.37 MIL/uL — ABNORMAL LOW (ref 4.22–5.81)
RDW: 16.1 % — ABNORMAL HIGH (ref 11.5–15.5)
WBC: 9.9 10*3/uL (ref 4.0–10.5)
nRBC: 0 % (ref 0.0–0.2)

## 2021-07-28 LAB — COMPREHENSIVE METABOLIC PANEL
ALT: 35 U/L (ref 0–44)
AST: 71 U/L — ABNORMAL HIGH (ref 15–41)
Albumin: 2.8 g/dL — ABNORMAL LOW (ref 3.5–5.0)
Alkaline Phosphatase: 412 U/L — ABNORMAL HIGH (ref 38–126)
Anion gap: 12 (ref 5–15)
BUN: 23 mg/dL (ref 8–23)
CO2: 27 mmol/L (ref 22–32)
Calcium: 8.4 mg/dL — ABNORMAL LOW (ref 8.9–10.3)
Chloride: 100 mmol/L (ref 98–111)
Creatinine, Ser: 0.81 mg/dL (ref 0.61–1.24)
GFR, Estimated: >60 mL/min (ref >60)
Glucose, Bld: 84 mg/dL (ref 70–99)
Potassium: 4.3 mmol/L (ref 3.5–5.1)
Sodium: 139 mmol/L (ref 135–145)
Total Bilirubin: 0.9 mg/dL (ref 0.3–1.2)
Total Protein: 5.5 g/dL — ABNORMAL LOW (ref 6.5–8.1)

## 2021-07-28 LAB — PROTIME-INR
INR: 1.1 (ref 0.8–1.2)
Prothrombin Time: 13.7 seconds (ref 11.4–15.2)

## 2021-07-28 MED ORDER — FENTANYL CITRATE (PF) 100 MCG/2ML IJ SOLN
INTRAMUSCULAR | Status: AC | PRN
  Administered 2021-07-28 (×3): 50 ug via INTRAVENOUS

## 2021-07-28 MED ORDER — TRAMADOL HCL 50 MG PO TABS
50.0000 mg | ORAL_TABLET | ORAL | Status: AC
  Administered 2021-07-28: 50 mg via ORAL
  Filled 2021-07-28: qty 1

## 2021-07-28 MED ORDER — IOHEXOL 300 MG/ML  SOLN
100.0000 mL | Freq: Once | INTRAMUSCULAR | Status: AC | PRN
  Administered 2021-07-28: 20 mL via INTRA_ARTERIAL

## 2021-07-28 MED ORDER — SODIUM CHLORIDE 0.9 % IV SOLN: INTRAVENOUS | Status: DC

## 2021-07-28 MED ORDER — FENTANYL CITRATE (PF) 100 MCG/2ML IJ SOLN
INTRAMUSCULAR | Status: AC
  Filled 2021-07-28: qty 2

## 2021-07-28 MED ORDER — IOHEXOL 300 MG/ML  SOLN
100.0000 mL | Freq: Once | INTRAMUSCULAR | Status: AC | PRN
  Administered 2021-07-28: 30 mL via INTRA_ARTERIAL

## 2021-07-28 MED ORDER — LIDOCAINE HCL 1 % IJ SOLN
INTRAMUSCULAR | Status: AC
  Filled 2021-07-28: qty 20

## 2021-07-28 MED ORDER — MIDAZOLAM HCL 2 MG/2ML IJ SOLN
INTRAMUSCULAR | Status: AC
  Filled 2021-07-28: qty 4

## 2021-07-28 MED ORDER — HEPARIN SOD (PORK) LOCK FLUSH 100 UNIT/ML IV SOLN
500.0000 [IU] | INTRAVENOUS | Status: AC | PRN
  Administered 2021-07-28: 500 [IU]
  Filled 2021-07-28: qty 5

## 2021-07-28 MED ORDER — MIDAZOLAM HCL 2 MG/2ML IJ SOLN
INTRAMUSCULAR | Status: AC | PRN
  Administered 2021-07-28 (×2): 1 mg via INTRAVENOUS

## 2021-07-28 MED ORDER — TECHNETIUM TO 99M ALBUMIN AGGREGATED
4.3000 | Freq: Once | INTRAVENOUS | Status: AC | PRN
  Administered 2021-07-28: 4.3 via INTRAVENOUS

## 2021-07-28 MED ORDER — LIDOCAINE HCL 1 % IJ SOLN
INTRAMUSCULAR | Status: AC | PRN
  Administered 2021-07-28: 5 mL

## 2021-07-28 NOTE — Discharge Instructions (Addendum)
Please call Interventional Radiology clinic [phone] with any questions or concerns. ? ?You may remove your dressing and shower tomorrow. ? ? ? ?Femoral Site Care ?The following information offers guidance on how to care for yourself after your procedure. Your health care provider may also give you more specific instructions. If you have problems or questions, contact your health care provider. ?What can I expect after the procedure? ?After the procedure, it is common to have bruising and tenderness at the incision site. This usually fades within 1-2 weeks. ?Follow these instructions at home: ?Incision site care ?A normal incision site compared to an infected incision site. The infected incision site has swelling and redness. ? ?Follow instructions from your health care provider about how to take care of your incision site. Make sure you: ?Wash your hands with soap and water for at least 20 seconds before and after you change your bandage (dressing). If soap and water are not available, use hand sanitizer. ?Change or remove your dressing as told by your health care provider. ?Leave stitches (sutures), skin glue, or adhesive strips in place. These skin closures may need to stay in place for 2 weeks or longer. If adhesive strip edges start to loosen and curl up, you may trim the loose edges. Do not remove adhesive strips completely unless your health care provider tells you to do that. ?Do not take baths, swim, or use a hot tub until your health care provider approves. ?You may shower 24-48 hours after the procedure or as told by your health care provider. ?Gently wash the incision site with plain soap and water. ?Pat the area dry with a clean towel. ?Do not rub the site. This may cause bleeding. ?Do not apply powder or lotion to the site. Keep the site clean and dry. ?Check your femoral site every day for signs of infection. Check for: ?Redness, swelling, or pain. ?Fluid or blood. ?Warmth. ?Pus or a bad  smell. ?Activity ?If you were given a sedative during the procedure, it can affect you for several hours. Do not drive or operate machinery until your health care provider says that it is safe. ?Rest as told by your health care provider. ?Avoid sitting for a long time without moving. Get up to take short walks every 1-2 hours. This is important to improve blood flow and breathing. Ask for help if you feel weak or unsteady. ?Return to your normal activities as told by your health care provider. Ask your health care provider what activities are safe for you and when you can return to work. ?Avoid activities that take a lot of effort for the first 2-3 days after your procedure, or as long as directed. ?Do not lift anything that is heavier than 10 lb (4.5 kg), or the limit that you are told, until your health care provider says that it is safe. ?General instructions ?Take over-the-counter and prescription medicines only as told by your health care provider. ?If you will be going home right after the procedure, plan to have a responsible adult care for you for the time you are told. This is important. ?Keep all follow-up visits. This is important. ?Contact a health care provider if: ?You have a fever or chills. ?You have any of these signs of infection at your incision site: ?Redness, swelling, or pain. ?Fluid or blood. ?Warmth. ?Pus or a bad smell. ?Get help right away if: ?The incision area swells very fast. ?The incision area is bleeding, and the bleeding does not stop when  you hold steady pressure on the area. ?Your leg or foot becomes pale, cool, tingly, or numb. ?These symptoms may represent a serious problem that is an emergency. Do not wait to see if the symptoms will go away. Get medical help right away. Call your local emergency services (911 in the [place] Do not drive yourself to the hospital. ?Summary ?After the procedure, it is common to have bruising and tenderness that fade within 1-2 weeks. ?Check your  femoral site every day for signs of infection. ?Do not lift anything that is heavier than 10 lb (4.5 kg), or the limit that you are told, until your health care provider says that it is safe. ?Get help right away if the incision area swells very fast, you have bleeding at the incision area that does not stop, or your leg or foot becomes pale, cool, or numb. ?This information is not intended to replace advice given to you by your health care provider. Make sure you discuss any questions you have with your health care provider. ?Document Revised: 12/01/2020 Document Reviewed: 05/02/2020 ?Elsevier Patient Education ? [place]. ?  ? ? ?Moderate Conscious Sedation, Adult, Care After ?This sheet gives you information about how to care for yourself after your procedure. Your health care provider may also give you more specific instructions. If you have problems or questions, contact your health care provider. ?What can I expect after the procedure? ?After the procedure, it is common to have: ?Sleepiness for several hours. ?Impaired judgment for several hours. ?Difficulty with balance. ?Vomiting if you eat too soon. ?Follow these instructions at home: ?For the time period you were told by your health care provider: ?Rest. ?Do not participate in activities where you could fall or become injured. ?Do not drive or use machinery. ?Do not drink alcohol. ?Do not take sleeping pills or medicines that cause drowsiness. ?Do not make important decisions or sign legal documents. ?Do not take care of children on your own.  ?  ?  ?Eating and drinking ?Follow the diet recommended by your health care provider. ?Drink enough fluid to keep your urine pale yellow. ?If you vomit: ?Drink water, juice, or soup when you can drink without vomiting. ?Make sure you have little or no nausea before eating solid foods.   ?General instructions ?Take over-the-counter and prescription medicines only as told by your health care provider. ?Have a  responsible adult stay with you for the time you are told. It is important to have someone help care for you until you are awake and alert. ?Do not smoke. ?Keep all follow-up visits as told by your health care provider. This is important. ?Contact a health care provider if: ?You are still sleepy or having trouble with balance after 24 hours. ?You feel light-headed. ?You keep feeling nauseous or you keep vomiting. ?You develop a rash. ?You have a fever. ?You have redness or swelling around the IV site. ?Get help right away if: ?You have trouble breathing. ?You have new-onset confusion at home. ?Summary ?After the procedure, it is common to feel sleepy, have impaired judgment, or feel nauseous if you eat too soon. ?Rest after you get home. Know the things you should not do after the procedure. ?Follow the diet recommended by your health care provider and drink enough fluid to keep your urine pale yellow. ?Get help right away if you have trouble breathing or new-onset confusion at home. ?This information is not intended to replace advice given to you by your health care provider.  Make sure you discuss any questions you have with your health care provider. ?Document Revised: 07/11/2019 Document Reviewed: 02/06/2019 ?Elsevier Patient Education ? [place].  ? ?AFTER YOUR NEXT PROCEDURE ? ?Radiation precautions ?For up to a week after your procedure, there will be a small amount of radioactivity near your liver. This is not especially dangerous to other people. However, as told by your health care provider, you should follow these precautions for 7 days: ?Do not come in close contact with people. ?Do not sleep in the same bed as someone else. ?Do not hold children or babies. ?Do not have contact with pregnant women. ? ? ?Post Y-90 Radioembolization Discharge Instructions ? ?You have been given a radioactive material during your procedure.  While it is safe for you to be discharged home from the hospital, you need  to proceed directly home.   ? ?Do not use public transportation, including air travel, lasting more than 2 hours for 1 week. ? ?Avoid crowded public places for 1 week. ? ?Adult visitors should try to avoid close

## 2021-07-28 NOTE — Procedures (Signed)
Interventional Radiology Procedure Note ? ?Procedure: Celiac, common hepatic, proper hepatic, GDA and right hepatic arteriography. 3-D spin with reconstruction. MAA injection into right hepatic artery. ? ?Complications: None ? ?Estimated Blood Loss: <10 mL ? ?Findings: ?Celiac origin stenosis, but able to catheterize and advance 5 Fr catheter. Right and left hepatic arteries patent. Dominant relatively hypovascular area of central tumor in liver supplied predominantly by right hepatic artery but also a branch of the left hepatic artery. GDA arteriography demonstrates reversed flow which was confirmed with attempted advancement of one coil. Coil recaptured and removed. MAA injected in right hepatic artery. ? ?Plan: ?NM imaging and lung shunt fraction calculation today. Scheduled for right lobe Y-90 radioembolization treatment next week. ? ?Eulas Post T. Kathlene Cote, M.D ?Pager:  [phone] ? ?  ?

## 2021-07-28 NOTE — H&P (Addendum)
? ? ?Referring Physician(s): ?Moody,J/Kale,G ? ?Supervising Physician: Aletta Edouard ? ?Patient Status:  [facility] ? ?Chief Complaint: ? 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 ?Subjective: ?Patient familiar to IR service from Port-A-Cath placement in 2020, right cervical lymph node biopsy in 2021 ,right liver mass biopsy on 06/10/2021 and consultation with Dr. Kathlene Cote on 06/15/2021 to discuss liver directed therapy for metastatic adenocarcinoma of the gallbladder/biliary tract.  He has a significant past medical history including diffuse large B-cell lymphoma currently in remission, stage IV mantle cell lymphoma, prostate cancer, anemia, thrombocytopenia, prior cholecystectomy on 12/29/2020, arthritis, hyperlipidemia, colon polyps, nephrolithiasis, hypertension.  Following discussions with Dr. Kathlene Cote patient was deemed an appropriate candidate for hepatic Y90 radioembolization and presents today for arterial roadmapping study, possible embolization and test Y90 dosing. ?He currently denies fever,HA,CP,dyspnea, cough, abd/back pain,N/V or bleeding. He does have hx sciatica, LE edema.  ? ?Past Medical History:  ?Diagnosis Date  ? Arthritis   ? Cancer [facility])   ? PROSTATE  ? History of colonic polyps   ? History of kidney stones   ? Hypertension   ? Inguinal hernia 03/2002  ? Pneumonia   ? "years ago"  ? ?Past Surgical History:  ?Procedure Laterality Date  ? CATARACT EXTRACTION Bilateral   ? CHOLECYSTECTOMY N/A 12/14/2020  ? Procedure: LAPAROSCOPIC CHOLECYSTECTOMY;  Surgeon: Stark Klein, MD;  Location: [place];  Service: General;  Laterality: N/A;  ? COLONOSCOPY    ? DIRECT LARYNGOSCOPY N/A 07/08/2018  ? Procedure: DIRECT LARYNGOSCOPY;  Surgeon: Leta Baptist, MD;  Location: [place];  Service: ENT;  Laterality: N/A;  ? ESOPHAGOSCOPY N/A 07/08/2018  ? Procedure: ESOPHAGOSCOPY;  Surgeon: Leta Baptist, MD;  Location: [place];  Service: ENT;  Laterality: N/A;  ? EYE SURGERY    ? FLEXIBLE BRONCHOSCOPY N/A 07/08/2018  ? Procedure:  FLEXIBLE BRONCHOSCOPY;  Surgeon: Leta Baptist, MD;  [facility];  Service: ENT;  Laterality: N/A;  ? HERNIA REPAIR Left   ? inguinal hernia  ? INGUINAL HERNIA REPAIR Right 09/03/2020  ? Procedure: OPEN RIGHT INGUINAL HERNIA REPAIR;  Surgeon: Clovis Riley, MD;  [facility];  Service: General;  Laterality: Right;  ? IR IMAGING GUIDED PORT INSERTION  07/30/2018  ? IR RADIOLOGIST EVAL & MGMT  06/15/2021  ? MASS BIOPSY Right 07/08/2018  ? Procedure: RIGHT NECK MASS EXCISIONAL BIOPSY;  Surgeon: Leta Baptist, MD;  Location: [place];  Service: ENT;  Laterality: Right;  ? PATELLA FRACTURE SURGERY Left   ? PROSTATE SURGERY  10/2005  ? RADIAL PROSTATECTOMY  ? ROTATOR CUFF REPAIR Bilateral   ? ? ? ?Allergies: ?Lisinopril and Penicillins ? ?Medications: ?Prior to Admission medications   ?Medication Sig Start Date End Date Taking? Authorizing Provider  ?Acalabrutinib Maleate (CALQUENCE) 100 MG TABS Take 100 mg by mouth 2 (two) times daily. 07/14/21  Yes Brunetta Genera, MD  ?acetaminophen (TYLENOL) 500 MG tablet Take 1,000 mg by mouth every 6 (six) hours as needed for moderate pain.   Yes [provider]  ?ARTIFICIAL TEAR SOLUTION OP Place 1 drop into both eyes 2 (two) times daily.   Yes [provider]  ?calcium carbonate (TUMS - DOSED IN MG ELEMENTAL CALCIUM) 500 MG chewable tablet Chew 2 tablets by mouth daily as needed for indigestion or heartburn.   Yes [provider]  ?furosemide (LASIX) 20 MG tablet Take 1 tablet (20 mg total) by mouth daily for 7 days. 07/27/21 08/03/21 Yes Walisiewicz, Kaitlyn E, PA-C  ?latanoprost (XALATAN) 0.005 % ophthalmic solution Place  1 drop into both eyes at bedtime.   Yes [provider]  ?lidocaine-prilocaine (EMLA) cream Apply 1 application. topically as needed (port access).   Yes [provider]  ?losartan-hydrochlorothiazide (HYZAAR) 50-12.5 MG tablet TAKE 1 TABLET BY MOUTH EVERY DAY 04/18/21  Yes Denita Lung, MD  ?ondansetron (ZOFRAN) 4  MG tablet Take 1 tablet (4 mg total) by mouth every 8 (eight) hours as needed for nausea or vomiting. 07/11/21  Yes Brunetta Genera, MD  ?PRESCRIPTION MEDICATION Apply 1 application. topically daily as needed (sun exposure). FLUOROURACIL 5% + CALCIPOTRIENE 0.005%   Yes [provider]  ?traMADol (ULTRAM) 50 MG tablet Take 1 tablet (50 mg total) by mouth every 8 (eight) hours as needed. 07/11/21  Yes Brunetta Genera, MD  ? ? ? ?Vital Signs: ?BP (!) 152/65 (BP Location: Right Arm)   Pulse 65   Temp 97.7 ?F (36.5 ?C) (Oral)   Resp 15   SpO2 100%  ? ?Physical Exam awake/alert, chest- CTA bilat; clean, intact left chest wall port a cath; heart- nl rate, occ ectopy; abd- soft,+BS,NT; bilat LE edema noted ? ?Imaging: ?DG Chest 2 View ? ?Result Date: 07/27/2021 ?CLINICAL DATA:  Shortness of breath and fatigue for 2 months. Lower extremity edema. Mantle cell lymphoma and hepatobiliary carcinoma. EXAM: CHEST - 2 VIEW COMPARISON:  05/28/2018 FINDINGS: The heart size and mediastinal contours are within normal limits. Left-sided power port is seen in appropriate position. No evidence of pulmonary infiltrate or edema. No evidence of pleural effusion. IMPRESSION: No active cardiopulmonary disease. Electronically Signed   By: Marlaine Hind M.D.   On: 07/27/2021 12:13   ? ?Labs: ? ?CBC: ?Recent Labs  ?  05/31/21 ?0841 06/10/21 ?1209 07/11/21 ?0909 07/27/21 ?0948  ?WBC 7.8 6.2 11.2* 11.2*  ?HGB 11.1* 11.0* 10.3* 9.8*  ?HCT 34.4* 34.8* 33.0* 30.9*  ?PLT 109* 115* 146* 126*  ? ? ?COAGS: ?Recent Labs  ?  06/10/21 ?1209  ?INR 1.0  ? ? ?BMP: ?Recent Labs  ?  04/25/21 ?5885 05/31/21 ?0277 07/11/21 ?4128 07/27/21 ?0948  ?NA 137 137 139 137  ?K 3.9 3.9 4.4 4.5  ?CL 100 101 101 99  ?CO2 _0 ?GLUCOSE 93 134* 87 86  ?BUN 20 22 32* 27*  ?CALCIUM 9.0 8.6* 8.5* 8.5*  ?CREATININE 0.89 0.94 0.96 0.90  ?GFRNONAA >60 >60 >60 >60  ? ? ?LIVER FUNCTION TESTS: ?Recent Labs  ?  04/25/21 ?7867 05/31/21 ?6720 07/11/21 ?9470  07/27/21 ?0948  ?BILITOT 0.5 0.5 0.5 0.6  ?AST 21 22 54* 84*  ?ALT _1 37  ?ALKPHOS 110 120 313* 398*  ?PROT 6.8 6.2* 6.1* 5.8*  ?ALBUMIN 3.9 3.6 3.3* 2.9*  ? ? ?Assessment and Plan: ?Patient familiar to IR service from Port-A-Cath placement in 2020, right cervical lymph node biopsy in 2021 ,right liver mass biopsy on 06/10/2021 and consultation with Dr. Kathlene Cote on 06/15/2021 to discuss liver directed therapy for metastatic adenocarcinoma of the gallbladder/biliary tract.  He has a significant past medical history including diffuse large B-cell lymphoma currently in remission, stage IV mantle cell lymphoma, prostate cancer, anemia, thrombocytopenia, prior cholecystectomy on 12/29/2020, arthritis, hyperlipidemia, colon polyps, nephrolithiasis, hypertension.  Following discussions with Dr. Kathlene Cote patient was deemed an appropriate candidate for hepatic Y90 radioembolization and presents today for arterial roadmapping study, possible embolization and test Y90 dosing.Risks and benefits of procedure were discussed with the patient including, but not limited to bleeding, infection, vascular injury or contrast induced renal failure. ? ?  This interventional procedure involves the use of X-rays and because of the nature of the planned procedure, it is possible that we will have prolonged use of X-ray fluoroscopy. ? ?Potential radiation risks to you include (but are not limited to) the following: ?- A slightly elevated risk for cancer  several years later in life. This risk is typically less ?than 0.5% percent. This risk is low in comparison to the normal incidence of human ?cancer, which is 33% for women and 50% for men according to the American Cancer ?Society. ?- Radiation induced injury can include skin redness, resembling a rash, tissue breakdown / ulcers and hair loss (which can be temporary or permanent).  ? ?The likelihood of either of these occurring depends on the difficulty of the procedure and whether you are  sensitive to radiation due to previous procedures, disease, or genetic conditions.  ? ?IF your procedure requires a prolonged use of radiation, you will be notified and given written instructions for further act

## 2021-07-31 DIAGNOSIS — Z20822 Contact with and (suspected) exposure to covid-19: Secondary | ICD-10-CM | POA: Diagnosis not present

## 2021-08-01 DIAGNOSIS — Z20822 Contact with and (suspected) exposure to covid-19: Secondary | ICD-10-CM | POA: Diagnosis not present

## 2021-08-02 ENCOUNTER — Ambulatory Visit (HOSPITAL_COMMUNITY)
Admission: RE | Admit: 2021-08-02 | Discharge: 2021-08-02 | Disposition: A | Discharge status: Home / Self Care | Payer: Medicare Other | Source: Ambulatory Visit | Attending: Physician Assistant | Admitting: Physician Assistant

## 2021-08-02 DIAGNOSIS — D649 Anemia, unspecified: Secondary | ICD-10-CM | POA: Diagnosis not present

## 2021-08-02 DIAGNOSIS — R609 Edema, unspecified: Secondary | ICD-10-CM | POA: Diagnosis not present

## 2021-08-02 DIAGNOSIS — M7989 Other specified soft tissue disorders: Secondary | ICD-10-CM | POA: Diagnosis not present

## 2021-08-02 DIAGNOSIS — Z859 Personal history of malignant neoplasm, unspecified: Secondary | ICD-10-CM | POA: Insufficient documentation

## 2021-08-02 DIAGNOSIS — I10 Essential (primary) hypertension: Secondary | ICD-10-CM | POA: Insufficient documentation

## 2021-08-02 DIAGNOSIS — R0602 Shortness of breath: Secondary | ICD-10-CM | POA: Insufficient documentation

## 2021-08-02 DIAGNOSIS — D696 Thrombocytopenia, unspecified: Secondary | ICD-10-CM | POA: Insufficient documentation

## 2021-08-02 NOTE — Progress Notes (Signed)
Echocardiogram ?2D Echocardiogram has been performed. ? Mario Garcia ?08/02/2021, 9:43 AM ?

## 2021-08-03 LAB — ECHOCARDIOGRAM COMPLETE
AR max vel: 2.06 cm2
AV Area VTI: 1.61 cm2
AV Area mean vel: 1.68 cm2
AV Mean grad: 6 mmHg
AV Peak grad: 12.4 mmHg
Ao pk vel: 1.76 m/s
Area-P 1/2: 2.21 cm2
S' Lateral: 2.6 cm

## 2021-08-04 ENCOUNTER — Ambulatory Visit (HOSPITAL_COMMUNITY)
Admission: RE | Admit: 2021-08-04 | Discharge: 2021-08-04 | Disposition: A | Discharge status: Home / Self Care | Payer: Medicare Other | Source: Ambulatory Visit | Attending: Interventional Radiology | Admitting: Interventional Radiology

## 2021-08-04 ENCOUNTER — Other Ambulatory Visit (HOSPITAL_COMMUNITY): Payer: Self-pay | Admitting: Interventional Radiology

## 2021-08-04 ENCOUNTER — Other Ambulatory Visit: Payer: Self-pay

## 2021-08-04 ENCOUNTER — Encounter (HOSPITAL_COMMUNITY)
Admission: RE | Admit: 2021-08-04 | Discharge: 2021-08-04 | Disposition: A | Discharge status: Home / Self Care | Payer: Medicare Other | Source: Ambulatory Visit | Attending: Interventional Radiology | Admitting: Interventional Radiology

## 2021-08-04 ENCOUNTER — Encounter (HOSPITAL_COMMUNITY): Payer: Self-pay

## 2021-08-04 VITALS — BP 122/64 | HR 68 | Temp 97.7°F | Resp 17

## 2021-08-04 DIAGNOSIS — E785 Hyperlipidemia, unspecified: Secondary | ICD-10-CM | POA: Insufficient documentation

## 2021-08-04 DIAGNOSIS — I10 Essential (primary) hypertension: Secondary | ICD-10-CM | POA: Diagnosis not present

## 2021-08-04 DIAGNOSIS — D126 Benign neoplasm of colon, unspecified: Secondary | ICD-10-CM

## 2021-08-04 DIAGNOSIS — C799 Secondary malignant neoplasm of unspecified site: Secondary | ICD-10-CM | POA: Insufficient documentation

## 2021-08-04 DIAGNOSIS — Z8509 Personal history of malignant neoplasm of other digestive organs: Secondary | ICD-10-CM | POA: Diagnosis not present

## 2021-08-04 DIAGNOSIS — Z8719 Personal history of other diseases of the digestive system: Secondary | ICD-10-CM | POA: Insufficient documentation

## 2021-08-04 DIAGNOSIS — C249 Malignant neoplasm of biliary tract, unspecified: Secondary | ICD-10-CM

## 2021-08-04 DIAGNOSIS — C787 Secondary malignant neoplasm of liver and intrahepatic bile duct: Secondary | ICD-10-CM | POA: Insufficient documentation

## 2021-08-04 DIAGNOSIS — Z8546 Personal history of malignant neoplasm of prostate: Secondary | ICD-10-CM | POA: Diagnosis not present

## 2021-08-04 DIAGNOSIS — C23 Malignant neoplasm of gallbladder: Secondary | ICD-10-CM | POA: Diagnosis not present

## 2021-08-04 DIAGNOSIS — C221 Intrahepatic bile duct carcinoma: Secondary | ICD-10-CM | POA: Diagnosis not present

## 2021-08-04 DIAGNOSIS — C7989 Secondary malignant neoplasm of other specified sites: Secondary | ICD-10-CM | POA: Diagnosis not present

## 2021-08-04 HISTORY — PX: IR EMBO TUMOR ORGAN ISCHEMIA INFARCT INC GUIDE ROADMAPPING: IMG5449

## 2021-08-04 HISTORY — PX: IR US GUIDE VASC ACCESS LEFT: IMG2389

## 2021-08-04 HISTORY — PX: IR ANGIOGRAM VISCERAL SELECTIVE: IMG657

## 2021-08-04 HISTORY — PX: IR ANGIOGRAM SELECTIVE EACH ADDITIONAL VESSEL: IMG667

## 2021-08-04 LAB — CBC WITH DIFFERENTIAL/PLATELET
Abs Immature Granulocytes: 0 10*3/uL (ref 0.00–0.07)
Basophils Absolute: 0 10*3/uL (ref 0.0–0.1)
Basophils Relative: 0 %
Eosinophils Absolute: 0 10*3/uL (ref 0.0–0.5)
Eosinophils Relative: 0 %
HCT: 31.4 % — ABNORMAL LOW (ref 39.0–52.0)
Hemoglobin: 9.6 g/dL — ABNORMAL LOW (ref 13.0–17.0)
Lymphocytes Relative: 42 %
Lymphs Abs: 4.8 10*3/uL — ABNORMAL HIGH (ref 0.7–4.0)
MCH: 29.4 pg (ref 26.0–34.0)
MCHC: 30.6 g/dL (ref 30.0–36.0)
MCV: 96.3 fL (ref 80.0–100.0)
Monocytes Absolute: 0.7 10*3/uL (ref 0.1–1.0)
Monocytes Relative: 6 %
Neutro Abs: 5.8 10*3/uL (ref 1.7–7.7)
Neutrophils Relative %: 50 %
Other: 2 %
Platelets: 154 10*3/uL (ref 150–400)
RBC: 3.26 MIL/uL — ABNORMAL LOW (ref 4.22–5.81)
RDW: 16.5 % — ABNORMAL HIGH (ref 11.5–15.5)
WBC: 11.5 10*3/uL — ABNORMAL HIGH (ref 4.0–10.5)
nRBC: 0 % (ref 0.0–0.2)

## 2021-08-04 LAB — COMPREHENSIVE METABOLIC PANEL
ALT: 32 U/L (ref 0–44)
AST: 85 U/L — ABNORMAL HIGH (ref 15–41)
Albumin: 2.7 g/dL — ABNORMAL LOW (ref 3.5–5.0)
Alkaline Phosphatase: 394 U/L — ABNORMAL HIGH (ref 38–126)
Anion gap: 15 (ref 5–15)
BUN: 26 mg/dL — ABNORMAL HIGH (ref 8–23)
CO2: 26 mmol/L (ref 22–32)
Calcium: 8.1 mg/dL — ABNORMAL LOW (ref 8.9–10.3)
Chloride: 99 mmol/L (ref 98–111)
Creatinine, Ser: 1.13 mg/dL (ref 0.61–1.24)
GFR, Estimated: >60 mL/min (ref >60)
Glucose, Bld: 83 mg/dL (ref 70–99)
Potassium: 3.8 mmol/L (ref 3.5–5.1)
Sodium: 140 mmol/L (ref 135–145)
Total Bilirubin: 1.1 mg/dL (ref 0.3–1.2)
Total Protein: 5.6 g/dL — ABNORMAL LOW (ref 6.5–8.1)

## 2021-08-04 LAB — PROTIME-INR
INR: 1 (ref 0.8–1.2)
Prothrombin Time: 13.4 seconds (ref 11.4–15.2)

## 2021-08-04 MED ORDER — METRONIDAZOLE 500 MG/100ML IV SOLN
500.0000 mg | Freq: Once | INTRAVENOUS | Status: AC
  Administered 2021-08-04: 500 mg via INTRAVENOUS
  Filled 2021-08-04: qty 100

## 2021-08-04 MED ORDER — LIDOCAINE HCL 1 % IJ SOLN
INTRAMUSCULAR | Status: AC | PRN
  Administered 2021-08-04: 10 mL via INTRADERMAL

## 2021-08-04 MED ORDER — FENTANYL CITRATE (PF) 100 MCG/2ML IJ SOLN
INTRAMUSCULAR | Status: AC
  Filled 2021-08-04: qty 2

## 2021-08-04 MED ORDER — FENTANYL CITRATE (PF) 100 MCG/2ML IJ SOLN
INTRAMUSCULAR | Status: AC | PRN
  Administered 2021-08-04: 50 ug via INTRAVENOUS

## 2021-08-04 MED ORDER — LEVOFLOXACIN IN D5W 500 MG/100ML IV SOLN
500.0000 mg | Freq: Once | INTRAVENOUS | Status: AC
  Administered 2021-08-04: 500 mg via INTRAVENOUS
  Filled 2021-08-04: qty 100

## 2021-08-04 MED ORDER — MIDAZOLAM HCL 2 MG/2ML IJ SOLN
INTRAMUSCULAR | Status: AC
  Filled 2021-08-04: qty 2

## 2021-08-04 MED ORDER — LIDOCAINE HCL 1 % IJ SOLN
INTRAMUSCULAR | Status: AC
  Filled 2021-08-04: qty 20

## 2021-08-04 MED ORDER — DEXAMETHASONE SODIUM PHOSPHATE 10 MG/ML IJ SOLN
8.0000 mg | Freq: Once | INTRAMUSCULAR | Status: AC
  Administered 2021-08-04: 8 mg via INTRAVENOUS
  Filled 2021-08-04: qty 1

## 2021-08-04 MED ORDER — IOHEXOL 300 MG/ML  SOLN
100.0000 mL | Freq: Once | INTRAMUSCULAR | Status: AC | PRN
  Administered 2021-08-04: 1 mL via INTRA_ARTERIAL

## 2021-08-04 MED ORDER — HEPARIN SOD (PORK) LOCK FLUSH 100 UNIT/ML IV SOLN
500.0000 [IU] | INTRAVENOUS | Status: DC | PRN
Start: 2021-08-04 — End: 2021-08-05
  Filled 2021-08-04: qty 5

## 2021-08-04 MED ORDER — PANTOPRAZOLE SODIUM 40 MG IV SOLR
40.0000 mg | Freq: Once | INTRAVENOUS | Status: AC
  Administered 2021-08-04: 40 mg via INTRAVENOUS
  Filled 2021-08-04: qty 10

## 2021-08-04 MED ORDER — SODIUM CHLORIDE 0.9 % IV SOLN: INTRAVENOUS | Status: DC

## 2021-08-04 MED ORDER — IOHEXOL 300 MG/ML  SOLN
100.0000 mL | Freq: Once | INTRAMUSCULAR | Status: AC | PRN
  Administered 2021-08-04: 15 mL via INTRA_ARTERIAL

## 2021-08-04 MED ORDER — YTTRIUM 90 INJECTION
27.6600 | INJECTION | Freq: Once | INTRAVENOUS | Status: AC | PRN
  Administered 2021-08-04: 27.66 via INTRA_ARTERIAL

## 2021-08-04 MED ORDER — MIDAZOLAM HCL 2 MG/2ML IJ SOLN
INTRAMUSCULAR | Status: AC | PRN
Start: 2021-08-04 — End: 2021-08-04
  Administered 2021-08-04: 1 mg via INTRAVENOUS

## 2021-08-04 MED ORDER — MIDAZOLAM HCL 2 MG/2ML IJ SOLN
INTRAMUSCULAR | Status: AC | PRN
  Administered 2021-08-04: 1 mg via INTRAVENOUS

## 2021-08-04 MED ORDER — SODIUM CHLORIDE 0.9 % IV SOLN
8.0000 mg | Freq: Once | INTRAVENOUS | Status: AC
  Administered 2021-08-04: 8 mg via INTRAVENOUS
  Filled 2021-08-04: qty 4

## 2021-08-04 MED ORDER — IOHEXOL 300 MG/ML  SOLN
100.0000 mL | Freq: Once | INTRAMUSCULAR | Status: AC | PRN
  Administered 2021-08-04: 20 mL via INTRA_ARTERIAL

## 2021-08-04 NOTE — Procedures (Signed)
Interventional Radiology Procedure Note ? ?Procedure: Radioembolization of right lobe of liver ? ?Complications: None ? ?Estimated Blood Loss: < 10 mL ? ?Findings: ?Stable patency of right hepatic arteries. 27 mCi Y-90 SIR sphere dose delivered to right lobe of liver via right hepatic artery without complication. ? ?Plan: ?NM imaging followed by 4 hr total recovery. ? ?Eulas Post T. Kathlene Cote, M.D ?Pager:  [phone] ? ?  ?

## 2021-08-04 NOTE — H&P (Signed)
? ? ?Referring Physician(s): ?Moody,J/Kale,G ? ?Supervising Physician: Aletta Edouard ? ?Patient Status:  [facility] ? 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 ? ?Subjective: ?Patient familiar to IR service from Port-A-Cath placement in 2020, right cervical lymph node biopsy in 2021 ,right liver mass biopsy on 06/10/2021 and consultation with Dr. Kathlene Cote on 06/15/2021 to discuss liver directed therapy for metastatic adenocarcinoma of the gallbladder/biliary tract.  He has a significant past medical history including diffuse large B-cell lymphoma currently in remission, stage IV mantle cell lymphoma, prostate cancer, anemia, thrombocytopenia, prior cholecystectomy on 12/29/2020, arthritis, hyperlipidemia, colon polyps, nephrolithiasis, hypertension.  Following discussions with Dr. Kathlene Cote patient was deemed an appropriate candidate for hepatic Y90 radioembolization and he presents today for the procedure.  Currently denies fever, headache, chest pain, dyspnea, cough, abdominal pain, nausea, vomiting or bleeding.  He does have some left-sided sacroiliac pain/sciatica ? ?Past Medical History:  ?Diagnosis Date  ? Arthritis   ? Cancer [facility])   ? PROSTATE  ? History of colonic polyps   ? History of kidney stones   ? Hypertension   ? Inguinal hernia 03/2002  ? Pneumonia   ? "years ago"  ? ?Past Surgical History:  ?Procedure Laterality Date  ? CATARACT EXTRACTION Bilateral   ? CHOLECYSTECTOMY N/A 12/14/2020  ? Procedure: LAPAROSCOPIC CHOLECYSTECTOMY;  Surgeon: Stark Klein, MD;  Location: [place];  Service: General;  Laterality: N/A;  ? COLONOSCOPY    ? DIRECT LARYNGOSCOPY N/A 07/08/2018  ? Procedure: DIRECT LARYNGOSCOPY;  Surgeon: Leta Baptist, MD;  Location: [place];  Service: ENT;  Laterality: N/A;  ? ESOPHAGOSCOPY N/A 07/08/2018  ? Procedure: ESOPHAGOSCOPY;  Surgeon: Leta Baptist, MD;  Location: [place];  Service: ENT;  Laterality: N/A;  ? EYE SURGERY    ? FLEXIBLE BRONCHOSCOPY N/A 07/08/2018  ? Procedure:  FLEXIBLE BRONCHOSCOPY;  Surgeon: Leta Baptist, MD;  [facility];  Service: ENT;  Laterality: N/A;  ? HERNIA REPAIR Left   ? inguinal hernia  ? INGUINAL HERNIA REPAIR Right 09/03/2020  ? Procedure: OPEN RIGHT INGUINAL HERNIA REPAIR;  Surgeon: Clovis Riley, MD;  [facility];  Service: General;  Laterality: Right;  ? IR 3D INDEPENDENT WKST  07/28/2021  ? IR ANGIOGRAM SELECTIVE EACH ADDITIONAL VESSEL  07/28/2021  ? IR ANGIOGRAM SELECTIVE EACH ADDITIONAL VESSEL  07/28/2021  ? IR ANGIOGRAM SELECTIVE EACH ADDITIONAL VESSEL  07/28/2021  ? IR ANGIOGRAM SELECTIVE EACH ADDITIONAL VESSEL  07/28/2021  ? IR ANGIOGRAM VISCERAL SELECTIVE  07/28/2021  ? IR IMAGING GUIDED PORT INSERTION  07/30/2018  ? IR RADIOLOGIST EVAL & MGMT  06/15/2021  ? IR US GUIDE VASC ACCESS RIGHT  07/28/2021  ? MASS BIOPSY Right 07/08/2018  ? Procedure: RIGHT NECK MASS EXCISIONAL BIOPSY;  Surgeon: Leta Baptist, MD;  Location: [place];  Service: ENT;  Laterality: Right;  ? PATELLA FRACTURE SURGERY Left   ? PROSTATE SURGERY  10/2005  ? RADIAL PROSTATECTOMY  ? ROTATOR CUFF REPAIR Bilateral   ? ? ? ? ?Allergies: ?Lisinopril and Penicillins ? ?Medications: ?Prior to Admission medications   ?Medication Sig Start Date End Date Taking? Authorizing Provider  ?Acalabrutinib Maleate (CALQUENCE) 100 MG TABS Take 100 mg by mouth 2 (two) times daily. 07/14/21  Yes Brunetta Genera, MD  ?acetaminophen (TYLENOL) 500 MG tablet Take 1,000 mg by mouth every 6 (six) hours as needed for moderate pain.   Yes [provider]  ?ARTIFICIAL TEAR SOLUTION OP Place 1 drop into both eyes 2 (two) times daily.   Yes [provider]  ?calcium carbonate (TUMS - DOSED IN MG ELEMENTAL CALCIUM) 500 MG chewable tablet Chew 2 tablets by mouth daily as needed for indigestion or heartburn.   Yes [provider]  ?latanoprost (XALATAN) 0.005 % ophthalmic solution Place 1 drop into both eyes at bedtime.   Yes [provider]  ?lidocaine-prilocaine (EMLA) cream Apply 1  application. topically as needed (port access).   Yes [provider]  ?traMADol (ULTRAM) 50 MG tablet Take 1 tablet (50 mg total) by mouth every 8 (eight) hours as needed. 07/11/21  Yes Brunetta Genera, MD  ?furosemide (LASIX) 20 MG tablet Take 1 tablet (20 mg total) by mouth daily for 7 days. 07/27/21 08/03/21  Walisiewicz, Harley Hallmark, PA-C  ?losartan-hydrochlorothiazide (HYZAAR) 50-12.5 MG tablet TAKE 1 TABLET BY MOUTH EVERY DAY 04/18/21   Denita Lung, MD  ?ondansetron (ZOFRAN) 4 MG tablet Take 1 tablet (4 mg total) by mouth every 8 (eight) hours as needed for nausea or vomiting. 07/11/21   Brunetta Genera, MD  ?PRESCRIPTION MEDICATION Apply 1 application. topically daily as needed (sun exposure). FLUOROURACIL 5% + CALCIPOTRIENE 0.005%    [provider]  ? ? ? ?Vital Signs: ?BP 125/60   Temp 98.1 ?F (36.7 ?C) (Oral)   Resp 18   SpO2 99%  ? ?Physical Exam: awake/alert, chest- CTA bilat; clean, intact left chest wall port a cath; heart- nl rate, occ ectopy; abd- soft,+BS,NT; bilat LE edema noted ? ?Imaging: ?ECHOCARDIOGRAM COMPLETE ? ?Result Date: 08/03/2021 ?   ECHOCARDIOGRAM REPORT   Patient Name:   Mario Garcia Date of Exam: 08/02/2021 Medical Rec #:  [id number]     Height:       70.0 in Accession #:    [id number]    Weight:       149.1 lb Date of Birth:  06-13-1931     BSA:          1.842 m? Patient Age:    88 years      BP:           108/58 mmHg Patient Gender: M             HR:           69 bpm. Exam Location:  Outpatient Procedure: 2D Echo, Cardiac Doppler and Color Doppler Indications:    Edema, unspecified type [R60.9]                  Shortness of breath [R06.02]  History:        Patient has prior history of Echocardiogram examinations, most                 recent 07/17/2018. Risk Factors:Hypertension. History of cancer.                 Bilateral leg swelling. Anemia. Thrombocytopenia.  Sonographer:    Darlina Sicilian RDCS Referring Phys: [id number] Barrie Folk  Sonographer  Comments: Global longitudinal strain was attempted. IMPRESSIONS  1. Left ventricular ejection fraction, by estimation, is 60 to 65%. The left ventricle has normal function. The left ventricle has no regional wall motion abnormalities. There is mild left ventricular hypertrophy. Left ventricular diastolic parameters are indeterminate.  2. Right ventricular systolic function is normal. The right ventricular size is mildly enlarged. There is mildly elevated pulmonary artery systolic pressure. The estimated right ventricular systolic pressure is 59.5 mmHg.  3. The mitral valve is normal in structure. Trivial mitral valve regurgitation. No evidence of  mitral stenosis.  4. The aortic valve is tricuspid. Aortic valve regurgitation is not visualized. Aortic valve sclerosis is present, with no evidence of aortic valve stenosis.  5. The inferior vena cava is normal in size with greater than 50% respiratory variability, suggesting right atrial pressure of 3 mmHg. FINDINGS  Left Ventricle: Left ventricular ejection fraction, by estimation, is 60 to 65%. The left ventricle has normal function. The left ventricle has no regional wall motion abnormalities. The left ventricular internal cavity size was normal in size. There is  mild left ventricular hypertrophy. Left ventricular diastolic parameters are indeterminate. Right Ventricle: The right ventricular size is mildly enlarged. Right vetricular wall thickness was not well visualized. Right ventricular systolic function is normal. There is mildly elevated pulmonary artery systolic pressure. The tricuspid regurgitant  velocity is 3.03 m/s, and with an assumed right atrial pressure of 3 mmHg, the estimated right ventricular systolic pressure is 20.3 mmHg. Left Atrium: Left atrial size was normal in size. Right Atrium: Right atrial size was normal in size. Pericardium: There is no evidence of pericardial effusion. Mitral Valve: The mitral valve is normal in structure. Trivial mitral  valve regurgitation. No evidence of mitral valve stenosis. Tricuspid Valve: The tricuspid valve is normal in structure. Tricuspid valve regurgitation is mild. Aortic Valve: The aortic valve is tricuspid. Aortic

## 2021-08-04 NOTE — Discharge Instructions (Signed)
Please call Interventional Radiology clinic [phone] with any questions or concerns. ? ?You may remove your dressing and shower tomorrow. ? ? ? ?Hepatic Artery Radioembolization, Care After ?The following information offers guidance on how to care for yourself after your procedure. Your health care provider may also give you more specific instructions. If you have problems or questions, contact your health care provider. ?What can I expect after the procedure? ?After the procedure, it is possible to have: ?A slight fever for 7 to 10 days. This may be accompanied by pain, nausea, or vomiting, which is referred to as post-embolization syndrome. You may be given medicine to help relieve these symptoms. If your fever gets worse, tell your health care provider. ?Tiredness (fatigue). ?Loss of appetite. This should gradually improve after about 1 week. ?Abdominal pain on your right side. ?Soreness and tenderness in your groin area where the needle and catheter were placed (puncture site). ?Follow these instructions at home: ?Puncture site care ?Follow instructions from your health care provider about how to take care of the puncture site. Make sure you: ?Wash your hands with soap and water for at least 20 seconds before and after you change your bandage (dressing). If soap and water are not available, use hand sanitizer. ?Change your dressing as told by your health care provider. ?Check your puncture site every day for signs of infection. Check for: ?More redness, swelling, or pain. ?Fluid or blood. ?Warmth. ?Pus or a bad smell. ?Activity ?Rest as told by your health care provider. ?Return to your normal activities as told by your health care provider. Ask your health care provider what activities are safe for you. ?Avoid sitting for a long time without moving. Get up to take short walks every 1-2 hours. This is important to improve blood flow and breathing. Ask for help if you feel weak or unsteady. ?If you were given  a sedative during the procedure, it can affect you for several hours. Do not drive or operate machinery until your health care provider says that it is safe. ?Do not lift anything that is heavier than 10 lb (4.5 kg), or the limit that you are told, until your health care provider says that it is safe. ?Medicines ?Take over-the-counter and prescription medicines only as told by your health care provider. ?Ask your health care provider if the medicine prescribed to you: ?Requires you to avoid driving or using machinery. ?Can cause constipation. You may need to take these actions to prevent or treat constipation: ?Drink enough fluid to keep your urine pale yellow. ?Take over-the-counter or prescription medicines. ?Eat foods that are high in fiber, such as beans, whole grains, and fresh fruits and vegetables. ?Limit foods that are high in fat and processed sugars, such as fried or sweet foods. ?General instructions ?Eat frequent, small meals until your appetite returns. Follow instructions from your health care provider about eating or drinking restrictions. ?Do not take baths, swim, or use a hot tub until your health care provider approves. You may take showers. Wash your puncture site with mild soap and water, and pat the area dry. ?Wear compression stockings as told by your health care provider. These stockings help to prevent blood clots and reduce swelling in your legs. ?Keep all follow-up visits. This is important. You may need to have blood tests and imaging tests. ?Contact a health care provider if: ?You have any of these signs of infection: ?More redness, swelling, or pain around your puncture site. ?Fluid or blood coming from your   puncture site. ?Warmth coming from your puncture site. ?Pus or a bad smell coming from your puncture site. ?You have pain that: ?Gets worse. ?Does not get better with medicine. ?Feels like very bad heartburn. ?Is in the middle of your abdomen, above your belly button. ?You have any  signs of infection or liver failure, such as: ?Your skin or the white parts of your eyes turn yellow (jaundice). ?The color of your urine changes to dark brown. ?The color of your stool (feces) changes to light yellow. ?Your abdominal measurement (girth) increases in a short period of time. ?You gain more than 5 lb (2.3 kg) in a short period of time. ?Get help right away if: ?You have a fever that lasts more than 10 days or is higher than what your health care provider told you to expect. ?You develop any of the following in your legs: ?Pain. ?Swelling. ?Skin that is cold or pale or turns blue. ?You have chest pain. ?You have blood in your vomit, saliva, or stool. ?You have trouble breathing. ?These symptoms may represent a serious problem that is an emergency. Do not wait to see if the symptoms will go away. Get medical help right away. Call your local emergency services (911 in the [place] Do not drive yourself to the hospital. ?Summary ?After the procedure, it is possible to have a slight fever for up to 7-10 days, tiredness, loss of appetite, abdominal pain on the right side, and groin tenderness where the catheter was placed. ?Do not come in close contact with people for up to a week after your procedure, as told by your health care provider. ?Follow instructions from your health care provider about how to take care of the puncture site. ?Contact a health care provider if you have any signs of infection. ?Get help right away if you develop pain or swelling in your legs or if your legs feel cool or look pale. ?This information is not intended to replace advice given to you by your health care provider. Make sure you discuss any questions you have with your health care provider. ?Document Revised: 02/15/2020 Document Reviewed: 02/15/2020 ?[employer] Patient Education ? 2022 [employer] ?  ? ? ? ?Moderate Conscious Sedation, Adult, Care After ?This sheet gives you information about how to care for yourself after  your procedure. Your health care provider may also give you more specific instructions. If you have problems or questions, contact your health care provider. ?What can I expect after the procedure? ?After the procedure, it is common to have: ?Sleepiness for several hours. ?Impaired judgment for several hours. ?Difficulty with balance. ?Vomiting if you eat too soon. ?Follow these instructions at home: ?For the time period you were told by your health care provider: ?Rest. ?Do not participate in activities where you could fall or become injured. ?Do not drive or use machinery. ?Do not drink alcohol. ?Do not take sleeping pills or medicines that cause drowsiness. ?Do not make important decisions or sign legal documents. ?Do not take care of children on your own.  ?  ?  ?Eating and drinking ?Follow the diet recommended by your health care provider. ?Drink enough fluid to keep your urine pale yellow. ?If you vomit: ?Drink water, juice, or soup when you can drink without vomiting. ?Make sure you have little or no nausea before eating solid foods.   ?General instructions ?Take over-the-counter and prescription medicines only as told by your health care provider. ?Have a responsible adult stay with you for the time   you are told. It is important to have someone help care for you until you are awake and alert. ?Do not smoke. ?Keep all follow-up visits as told by your health care provider. This is important. ?Contact a health care provider if: ?You are still sleepy or having trouble with balance after 24 hours. ?You feel light-headed. ?You keep feeling nauseous or you keep vomiting. ?You develop a rash. ?You have a fever. ?You have redness or swelling around the IV site. ?Get help right away if: ?You have trouble breathing. ?You have new-onset confusion at home. ?Summary ?After the procedure, it is common to feel sleepy, have impaired judgment, or feel nauseous if you eat too soon. ?Rest after you get home. Know the things you  should not do after the procedure. ?Follow the diet recommended by your health care provider and drink enough fluid to keep your urine pale yellow. ?Get help right away if you have trouble breathing or

## 2021-08-10 ENCOUNTER — Inpatient Hospital Stay (HOSPITAL_BASED_OUTPATIENT_CLINIC_OR_DEPARTMENT_OTHER): Payer: Medicare Other | Admitting: Hematology

## 2021-08-10 ENCOUNTER — Inpatient Hospital Stay: Payer: Medicare Other

## 2021-08-10 ENCOUNTER — Other Ambulatory Visit: Payer: Self-pay

## 2021-08-10 VITALS — BP 116/54 | HR 71 | Temp 97.5°F | Resp 18 | Wt 153.6 lb

## 2021-08-10 DIAGNOSIS — C8310 Mantle cell lymphoma, unspecified site: Secondary | ICD-10-CM | POA: Insufficient documentation

## 2021-08-10 DIAGNOSIS — C23 Malignant neoplasm of gallbladder: Secondary | ICD-10-CM | POA: Diagnosis not present

## 2021-08-10 DIAGNOSIS — C787 Secondary malignant neoplasm of liver and intrahepatic bile duct: Secondary | ICD-10-CM | POA: Diagnosis not present

## 2021-08-10 DIAGNOSIS — C8318 Mantle cell lymphoma, lymph nodes of multiple sites: Secondary | ICD-10-CM

## 2021-08-10 DIAGNOSIS — R601 Generalized edema: Secondary | ICD-10-CM | POA: Diagnosis not present

## 2021-08-10 LAB — CMP (CANCER CENTER ONLY)
ALT: 25 U/L (ref 0–44)
AST: 55 U/L — ABNORMAL HIGH (ref 15–41)
Albumin: 3 g/dL — ABNORMAL LOW (ref 3.5–5.0)
Alkaline Phosphatase: 373 U/L — ABNORMAL HIGH (ref 38–126)
Anion gap: 15 (ref 5–15)
BUN: 21 mg/dL (ref 8–23)
CO2: 26 mmol/L (ref 22–32)
Calcium: 8.2 mg/dL — ABNORMAL LOW (ref 8.9–10.3)
Chloride: 100 mmol/L (ref 98–111)
Creatinine: 0.8 mg/dL (ref 0.61–1.24)
GFR, Estimated: >60 mL/min (ref >60)
Glucose, Bld: 82 mg/dL (ref 70–99)
Potassium: 3.8 mmol/L (ref 3.5–5.1)
Sodium: 141 mmol/L (ref 135–145)
Total Bilirubin: 0.8 mg/dL (ref 0.3–1.2)
Total Protein: 5.4 g/dL — ABNORMAL LOW (ref 6.5–8.1)

## 2021-08-10 LAB — CBC WITH DIFFERENTIAL/PLATELET
Abs Immature Granulocytes: 0.25 10*3/uL — ABNORMAL HIGH (ref 0.00–0.07)
Basophils Absolute: 0.2 10*3/uL — ABNORMAL HIGH (ref 0.0–0.1)
Basophils Relative: 1 %
Eosinophils Absolute: 0 10*3/uL (ref 0.0–0.5)
Eosinophils Relative: 0 %
HCT: 32.6 % — ABNORMAL LOW (ref 39.0–52.0)
Hemoglobin: 10 g/dL — ABNORMAL LOW (ref 13.0–17.0)
Immature Granulocytes: 2 %
Lymphocytes Relative: 35 %
Lymphs Abs: 4.7 10*3/uL — ABNORMAL HIGH (ref 0.7–4.0)
MCH: 29.4 pg (ref 26.0–34.0)
MCHC: 30.7 g/dL (ref 30.0–36.0)
MCV: 95.9 fL (ref 80.0–100.0)
Monocytes Absolute: 2.4 10*3/uL — ABNORMAL HIGH (ref 0.1–1.0)
Monocytes Relative: 18 %
Neutro Abs: 5.9 10*3/uL (ref 1.7–7.7)
Neutrophils Relative %: 44 %
Platelets: 127 10*3/uL — ABNORMAL LOW (ref 150–400)
RBC: 3.4 MIL/uL — ABNORMAL LOW (ref 4.22–5.81)
RDW: 17.1 % — ABNORMAL HIGH (ref 11.5–15.5)
Smear Review: NORMAL
WBC: 13.3 10*3/uL — ABNORMAL HIGH (ref 4.0–10.5)
nRBC: 0 % (ref 0.0–0.2)

## 2021-08-10 LAB — LACTATE DEHYDROGENASE: LDH: 454 U/L — ABNORMAL HIGH (ref 98–192)

## 2021-08-10 MED ORDER — TRAMADOL HCL 50 MG PO TABS: 50.0000 mg | ORAL_TABLET | Freq: Three times a day (TID) | ORAL | 0 refills | Status: DC | PRN

## 2021-08-10 MED ORDER — SENNOSIDES-DOCUSATE SODIUM 8.6-50 MG PO TABS: 2.0000 | ORAL_TABLET | Freq: Every evening | ORAL | 1 refills | Status: AC | PRN

## 2021-08-10 MED ORDER — GABAPENTIN 100 MG PO CAPS: 200.0000 mg | ORAL_CAPSULE | Freq: Every day | ORAL | 2 refills | Status: AC

## 2021-08-10 MED ORDER — FUROSEMIDE 20 MG PO TABS: 20.0000 mg | ORAL_TABLET | Freq: Every day | ORAL | 1 refills | Status: DC

## 2021-08-10 NOTE — Progress Notes (Signed)
HEMATOLOGY/ONCOLOGY CLINIC VISIT NOTE  Date of Service: 08/10/2021  Patient Care Team: Denita Lung, MD as PCP - General (Family Medicine) Brunetta Genera, MD as Consulting Physician (Hematology) Viona Gilmore, [facility] as Pharmacist (Pharmacist)  CHIEF COMPLAINTS/PURPOSE OF CONSULTATION:  Follow-up for continued evaluation and management of mantle cell lymphoma and metastatic gallbladder adenocarcinoma  HISTORY OF PRESENTING ILLNESS:  Please see previous HPI for details  .DOXOrubicin (ADRIAMYCIN) chemo injection 48 mg, 25 mg/m2 = 48 mg (100 % of original dose 25 mg/m2), Intravenous,  Once, 6 of 6 cycles  Dose modification: 25 mg/m2 (original dose 25 mg/m2, Cycle 1, Reason: Patient Age), 37.5 mg/m2 (original dose 25 mg/m2, Cycle 4, Reason: Provider Judgment)  Administration: 48 mg (08/05/2018), 48 mg (08/26/2018), 48 mg (09/16/2018), 72 mg (10/07/2018), 72 mg (10/28/2018), 72 mg (11/18/2018)    pegfilgrastim-cbqv (UDENYCA) injection 6 mg, 6 mg, Subcutaneous, Once, 6 of 6 cycles  Administration: 6 mg (08/07/2018), 6 mg (08/28/2018), 6 mg (09/18/2018), 6 mg (10/09/2018), 6 mg (10/30/2018), 6 mg (11/20/2018)    vinCRIStine (ONCOVIN) 1 mg in sodium chloride 0.9 % 50 mL chemo infusion, 1 mg (100 % of original dose 1 mg), Intravenous,  Once, 6 of 6 cycles  Dose modification: 1 mg (original dose 1 mg, Cycle 1, Reason: Patient Age)  Administration: 1 mg (08/05/2018), 1 mg (08/26/2018), 1 mg (09/16/2018), 1 mg (10/07/2018), 1 mg (10/28/2018), 1 mg (11/18/2018)    riTUXimab (RITUXAN) 700 mg in sodium chloride 0.9 % 250 mL (2.1875 mg/mL) infusion, 375 mg/m2 = 700 mg, Intravenous,  Once, 1 of 1 cycle  Administration: 700 mg (08/05/2018)    cyclophosphamide (CYTOXAN) 780 mg in sodium chloride 0.9 % 250 mL chemo infusion, 400 mg/m2 = 780 mg (100 % of original dose 400 mg/m2), Intravenous,  Once, 6 of 6 cycles  Dose modification: 400 mg/m2 (original dose 400 mg/m2, Cycle 1, Reason: Patient Age), 500  mg/m2 (original dose 400 mg/m2, Cycle 5, Reason: Provider Judgment)  Administration: 780 mg (08/05/2018), 780 mg (08/26/2018), 780 mg (09/16/2018), 780 mg (10/07/2018), 960 mg (10/28/2018), 960 mg (11/18/2018)    riTUXimab (RITUXAN) 700 mg in sodium chloride 0.9 % 180 mL infusion, 375 mg/m2 = 700 mg, Intravenous,  Once, 5 of 5 cycles  Administration: 700 mg (08/26/2018), 700 mg (09/16/2018), 700 mg (10/07/2018), 700 mg (10/28/2018), 700 mg (11/18/2018)  dexamethasone (DECADRON) 4 MG tablet, 8 mg, Oral, Daily, 1 of 1 cycle, Start date: 03/16/2020, End date: 09/03/2020    pegfilgrastim-cbqv [facility]) injection 6 mg, 6 mg, Subcutaneous, Once, 5 of 5 cycles  Administration: 6 mg (03/27/2020), 6 mg (04/24/2020), 6 mg (05/22/2020), 6 mg (06/21/2020), 6 mg (07/21/2020)    bendamustine (BENDEKA) 125 mg in sodium chloride 0.9 % 50 mL (2.2727 mg/mL) chemo infusion, 70 mg/m2 = 125 mg (100 % of original dose 70 mg/m2), Intravenous,  Once, 5 of 5 cycles  Dose modification: 70 mg/m2 (original dose 70 mg/m2, Cycle 1, Reason: Provider Judgment)  Administration: 125 mg (03/25/2020), 125 mg (03/26/2020), 125 mg (04/22/2020), 125 mg (04/23/2020), 125 mg (05/20/2020), 125 mg (05/21/2020), 125 mg (06/17/2020), 125 mg (06/18/2020), 125 mg (07/19/2020), 125 mg (07/20/2020)    riTUXimab-pvvr (RUXIENCE) 700 mg in sodium chloride 0.9 % 250 mL (2.1875 mg/mL) infusion, 375 mg/m2 = 700 mg, Intravenous,  Once, 2 of 2 cycles  Administration: 700 mg (03/25/2020)   INTERVAL HISTORY:   Mr. Mario Garcia is here for follow-up of his mantle cell lymphoma and metastatic gallbladder carcinoma. He reports He is  doing okay with no new symptoms or concerns.  He reports some grade 1 fatigue that worsened following the Y90 treatment.  He notes some issues with constipation. We discussed taking a laxative which he was agreeable to.  He notes weakness in the left leg.  He reports some significant sharp lower back pain that runs down to his knees. He  notes that Tramadol has not been helping. He notes pain improves when his legs are elevated. We discussed potentially getting an MRI if needed or trying Gabapentin in addition to Tramadol. He was agreeable to trying the Gabapentin.  He continues to be on acalabrutinib 100 mg p.o. twice daily.  He notes no significant nausea or vomiting or diarrhea.   Patient notes bilateral lower extremity swelling for which she was seen in the symptom management clinic.  He had an echocardiogram which showed left ventricular ejection fraction of 60 to 65% with no regional wall motion abnormalities.  No other significant prohibitive toxicities noted at this time.  Wt Readings from Last 3 Encounters:  07/27/21 149 lb 1.6 oz (67.6 kg)  07/25/21 148 lb 9.6 oz (67.4 kg)  07/11/21 145 lb 1.6 oz (65.8 kg)  He notes that his appetite has decreased and that he is trying to find more foods he enjoys eating to maintain his weight.  Labs done today 08/10/2021 were reviewed in detail. CBC shows slightly elevated WBC count at 13.3k, slight anemia, decreased Hgb at 10.0, and mild thrombocytopenia with platelets of 127k.  CMP slight hypocalcemia with calcium at 8.2, albumin slightly decreased at 3.0 g/dL, ALP elevated at 373 U/L. LDH at 454 CA 19-9--> 1839  MEDICAL HISTORY:  Past Medical History:  Diagnosis Date   Arthritis    Cancer ([place])    PROSTATE   History of colonic polyps    History of kidney stones    Hypertension    Inguinal hernia 03/2002   Pneumonia    "years ago"    SURGICAL HISTORY: Past Surgical History:  Procedure Laterality Date   CATARACT EXTRACTION Bilateral    CHOLECYSTECTOMY N/A 12/14/2020   Procedure: LAPAROSCOPIC CHOLECYSTECTOMY;  Surgeon: Stark Klein, MD;  Location: [place];  Service: General;  Laterality: N/A;   COLONOSCOPY     DIRECT LARYNGOSCOPY N/A 07/08/2018   Procedure: DIRECT LARYNGOSCOPY;  Surgeon: Leta Baptist, MD;  Location: [place];  Service: ENT;  Laterality: N/A;    ESOPHAGOSCOPY N/A 07/08/2018   Procedure: ESOPHAGOSCOPY;  Surgeon: Leta Baptist, MD;  Location: [place];  Service: ENT;  Laterality: N/A;   EYE SURGERY     FLEXIBLE BRONCHOSCOPY N/A 07/08/2018   Procedure: FLEXIBLE BRONCHOSCOPY;  Surgeon: Leta Baptist, MD;  Location: [place];  Service: ENT;  Laterality: N/A;   HERNIA REPAIR Left    inguinal hernia   INGUINAL HERNIA REPAIR Right 09/03/2020   Procedure: OPEN RIGHT INGUINAL HERNIA REPAIR;  Surgeon: Clovis Riley, MD;  Location: [facility];  Service: General;  Laterality: Right;   IR 3D INDEPENDENT WKST  07/28/2021   IR ANGIOGRAM SELECTIVE EACH ADDITIONAL VESSEL  07/28/2021   IR ANGIOGRAM SELECTIVE EACH ADDITIONAL VESSEL  07/28/2021   IR ANGIOGRAM SELECTIVE EACH ADDITIONAL VESSEL  07/28/2021   IR ANGIOGRAM SELECTIVE EACH ADDITIONAL VESSEL  07/28/2021   IR ANGIOGRAM SELECTIVE EACH ADDITIONAL VESSEL  08/04/2021   IR ANGIOGRAM SELECTIVE EACH ADDITIONAL VESSEL  08/04/2021   IR ANGIOGRAM VISCERAL SELECTIVE  07/28/2021   IR ANGIOGRAM VISCERAL SELECTIVE  08/04/2021   IR EMBO TUMOR ORGAN ISCHEMIA INFARCT INC  GUIDE ROADMAPPING  08/04/2021   IR IMAGING GUIDED PORT INSERTION  07/30/2018   IR RADIOLOGIST EVAL & MGMT  06/15/2021   IR US GUIDE VASC ACCESS LEFT  08/04/2021   IR US GUIDE VASC ACCESS RIGHT  07/28/2021   MASS BIOPSY Right 07/08/2018   Procedure: RIGHT NECK MASS EXCISIONAL BIOPSY;  Surgeon: Leta Baptist, MD;  Location: [place];  Service: ENT;  Laterality: Right;   PATELLA FRACTURE SURGERY Left    PROSTATE SURGERY  10/2005   RADIAL PROSTATECTOMY   ROTATOR CUFF REPAIR Bilateral     SOCIAL HISTORY: Social History   Socioeconomic History   Marital status: Married    Spouse name: Not on file   Number of children: Not on file   Years of education: Not on file   Highest education level: Not on file  Occupational History   Not on file  Tobacco Use   Smoking status: Former   Smokeless tobacco: Never   Tobacco comments:    stopped at the age of  73  Vaping Use   Vaping  Use: Never used  Substance and Sexual Activity   Alcohol use: Yes    Alcohol/week: 6.0 standard drinks    Types: 6 Standard drinks or equivalent per week   Drug use: No   Sexual activity: Not Currently  Other Topics Concern   Not on file  Social History Narrative   Not on file   Social Determinants of Health   Financial Resource Strain: Not on file  Food Insecurity: Not on file  Transportation Needs: Not on file  Physical Activity: Not on file  Stress: Not on file  Social Connections: Not on file  Intimate Partner Violence: Not on file    FAMILY HISTORY: Family History  Problem Relation Age of Onset   Cancer Father    Cancer Brother     ALLERGIES:  is allergic to lisinopril and penicillins.  MEDICATIONS:  Current Outpatient Medications  Medication Sig Dispense Refill   Acalabrutinib Maleate (CALQUENCE) 100 MG TABS Take 100 mg by mouth 2 (two) times daily. 60 tablet 3   acetaminophen (TYLENOL) 500 MG tablet Take 1,000 mg by mouth every 6 (six) hours as needed for moderate pain.     ARTIFICIAL TEAR SOLUTION OP Place 1 drop into both eyes 2 (two) times daily.     calcium carbonate (TUMS - DOSED IN MG ELEMENTAL CALCIUM) 500 MG chewable tablet Chew 2 tablets by mouth daily as needed for indigestion or heartburn.     furosemide (LASIX) 20 MG tablet Take 1 tablet (20 mg total) by mouth daily for 7 days. 7 tablet 0   latanoprost (XALATAN) 0.005 % ophthalmic solution Place 1 drop into both eyes at bedtime.     lidocaine-prilocaine (EMLA) cream Apply 1 application. topically as needed (port access).     losartan-hydrochlorothiazide (HYZAAR) 50-12.5 MG tablet TAKE 1 TABLET BY MOUTH EVERY DAY 90 tablet 1   ondansetron (ZOFRAN) 4 MG tablet Take 1 tablet (4 mg total) by mouth every 8 (eight) hours as needed for nausea or vomiting. 20 tablet 0   PRESCRIPTION MEDICATION Apply 1 application. topically daily as needed (sun exposure). FLUOROURACIL 5% + CALCIPOTRIENE 0.005%     traMADol  (ULTRAM) 50 MG tablet Take 1 tablet (50 mg total) by mouth every 8 (eight) hours as needed. 30 tablet 0   No current facility-administered medications for this visit.    REVIEW OF SYSTEMS:   10 Point review of Systems was done is  negative except as noted above.  PHYSICAL EXAMINATION:  .BP (!) 116/54   Pulse 71   Temp (!) 97.5 F (36.4 C)   Resp 18   Wt 153 lb 9.6 oz (69.7 kg)   SpO2 100%   BMI 22.04 kg/m  . Wt Readings from Last 3 Encounters:  07/27/21 149 lb 1.6 oz (67.6 kg)  07/25/21 148 lb 9.6 oz (67.4 kg)  07/11/21 145 lb 1.6 oz (65.8 kg)  NAD  GENERAL:alert, in no acute distress and comfortable SKIN: no acute rashes, no significant lesions EYES: conjunctiva are pink and non-injected, sclera anicteric NECK: supple, no JVD LYMPH: Small bilateral cervical lymph nodes, no palpable lymphadenopathy in the axillary or inguinal regions LUNGS: clear to auscultation b/l with normal respiratory effort HEART: regular rate & rhythm ABDOMEN:  normoactive bowel sounds , non tender. Mild splenomegaly and hepatomegaly. Extremity: 2+ leg swelling. PSYCH: alert & oriented x 3 with fluent speech NEURO: no focal motor/sensory deficits  LABORATORY DATA:  I have reviewed the data as listed     Latest Ref Rng & Units 08/10/2021    9:05 AM 08/04/2021    7:40 AM 07/28/2021    8:00 AM  CBC  WBC 4.0 - 10.5 K/uL 13.3   11.5   9.9    Hemoglobin 13.0 - 17.0 g/dL 10.0   9.6   9.7    Hematocrit 39.0 - 52.0 % 32.6   31.4   32.1    Platelets 150 - 400 K/uL 127   154   141      .    Latest Ref Rng & Units 08/10/2021    9:05 AM 08/04/2021    7:40 AM 07/28/2021    8:00 AM  CMP  Glucose 70 - 99 mg/dL 82   83   84    BUN 8 - 23 mg/dL _0 Creatinine 0.61 - 1.24 mg/dL 0.80   1.13   0.81    Sodium 135 - 145 mmol/L 141   140   139    Potassium 3.5 - 5.1 mmol/L 3.8   3.8   4.3    Chloride 98 - 111 mmol/L 100   99   100    CO2 22 - 32 mmol/L _1 Calcium 8.9 - 10.3 mg/dL 8.2    8.1   8.4    Total Protein 6.5 - 8.1 g/dL 5.4   5.6   5.5    Total Bilirubin 0.3 - 1.2 mg/dL 0.8   1.1   0.9    Alkaline Phos 38 - 126 U/L 373   394   412    AST 15 - 41 U/L 55   85   71    ALT 0 - 44 U/L 25   32   35     . Lab Results  Component Value Date   LDH 454 (H) 08/10/2021     02/23/2020 Right Cervical Lymph Node Surgical Pathology [phone]):    02/23/2020 [place] Analysis:   02/23/2020 FISH Mutation Analysis:    03/03/2019 NM PET Image Restag (PS) Skull Base To Thigh (Accession [id number]):   07/08/18 Right Cervical Tissue Biopsy:   SURGICAL PATHOLOGY  CASE: [id number]  PATIENT: Koron Stough  Surgical Pathology Report      Clinical History: liver mass, possible lymphoma vs HCC/cholangio vs  other mets (cm)      FINAL MICROSCOPIC DIAGNOSIS:  A. LIVER MASS, NEEDLE CORE BIOPSY:  - Adenocarcinoma.  - See comment.   COMMENT:  The adenocarcinoma is positive with cytokeratin 7, CDX2 and MOC-31.  There is focal, weak positivity with cytokeratin 20 and HepPar 1.  The  tumor is negative with TTF-1, Napsin A and arginase 1.  The differential  includes upper gastrointestinal and pancreaticobiliary primary.  RADIOGRAPHIC STUDIES: I have personally reviewed the radiological images as listed and agreed with the findings in the report. DG Chest 2 View  Result Date: 07/27/2021 CLINICAL DATA:  Shortness of breath and fatigue for 2 months. Lower extremity edema. Mantle cell lymphoma and hepatobiliary carcinoma. EXAM: CHEST - 2 VIEW COMPARISON:  05/28/2018 FINDINGS: The heart size and mediastinal contours are within normal limits. Left-sided power port is seen in appropriate position. No evidence of pulmonary infiltrate or edema. No evidence of pleural effusion. IMPRESSION: No active cardiopulmonary disease. Electronically Signed   By: Marlaine Hind M.D.   On: 07/27/2021 12:13   NM LIVER TUMOR LOC IMFLAM SPECT 1 DAY  Result Date: 08/05/2021 CLINICAL DATA:   Gallbladder carcinoma with liver metastasis. Microsphere radio therapy to the RIGHT hepatic lobe. EXAM: NUCLEAR MEDICINE SPECIAL MED RAD PHYSICS CONS; NUCLEAR MEDICINE RADIO PHARM THERAPY INTRA ARTERIAL; NUCLEAR MEDICINE TREATMENT PROCEDURE; NUCLEAR MEDICINE LIVER SCAN TECHNIQUE: In conjunction with the interventional radiologist a Y- Microsphere dose was calculated utilizing body surface area formulation as well as partition formulation. Post processing volume metrics performed. Calculated dose equal 28.39 mCi. Pre therapy MAA liver SPECT scan and CTA were evaluated. Utilizing a microcatheter system, the hepatic artery was selected and Y-90 microspheres were delivered in fractionated aliquots. Radiopharmaceutical was delivered by the interventional radiologist and nuclear radiologist. The patient tolerated procedure well. No adverse effects were noted. Bremsstrahlung planar and SPECT imaging of the abdomen following intrahepatic arterial delivery of Y-90 microsphere was performed. RADIOPHARMACEUTICALS:  17.6 millicuries yttrium 90 microspheres COMPARISON:  MAA scan 07/28/2021, MRI 05/25/2021 FINDINGS: Y - 90 microspheres therapy as above. First therapy the right hepatic lobe. Bremsstrahlung planar and SPECT imaging of the abdomen following intrahepatic arterial delivery of Y-29mcrosphere demonstrates radioactivity localized to the RIGHT hepatic lobe. No evidence of extrahepatic activity. IMPRESSION: Successful Y - 90 microsphere delivery for treatment of unresectable liver metastasis. First therapy to the RIGHT lobe. Bremssstrahlung scan demonstrates activity localized to RIGHT hepatic lobe with no extrahepatic activity identified. Electronically Signed   By: SSuzy BouchardM.D.   On: 08/05/2021 14:08   IR Angiogram Visceral Selective  Result Date: 08/04/2021 CLINICAL DATA:  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 EXAM:  1. ULTRASOUND GUIDANCE FOR VASCULAR ACCESS OF THE RIGHT COMMON FEMORAL ARTERY 2. VISCERAL SELECTIVE ARTERIOGRAPHY OF THE CELIAC AXIS 3. SELECTIVE ARTERIOGRAPHY AT THE LEVEL OF THE COMMON HEPATIC ARTERY 4. SELECTIVE ARTERIOGRAPHY AT THE LEVEL OF THE RIGHT HEPATIC ARTERY 5. TRANSCATHETER YTTRIUM-90 RADIOEMBOLIZATION OF THE RIGHT HEPATIC ARTERY FLUOROSCOPY: 12 minutes and 36 seconds.  174 mGy. MEDICATIONS AND MEDICAL HISTORY: Additional Medications: 500 mg IV Flagyl, 500 mg IV Levaquin, 8 mg IV Decadron, 40 mg IV Protonix, 8 mg IV Zofran Y-90 dose: 27 mCi ANESTHESIA/SEDATION: Moderate (conscious) sedation was employed during this procedure. A total of Versed 2.0 mg and Fentanyl 100 mcg was administered intravenously by radiology nursing. Moderate Sedation Time: 51 minutes. The patient's level of consciousness and vital signs were monitored continuously by radiology nursing throughout the procedure under my direct supervision. CONTRAST:  35 mL Omnipaque-300  PROCEDURE: The procedure, risks, benefits, and alternatives were explained to the patient. Questions regarding the procedure were encouraged and answered. The patient understands and consents to the procedure. A time-out was performed prior to initiating the procedure. The left groin was prepped with chlorhexidine in a sterile fashion, and a sterile drape was applied covering the operative field. A sterile gown and sterile gloves were used for the procedure. Local anesthesia was provided with 1% Lidocaine. Ultrasound image documentation was performed. Ultrasound was performed of both right and left common femoral arteries. The left was chosen for access. A permanent ultrasound image was recorded to document patency and level of access. Access of the left common femoral artery was performed under ultrasound guidance with a micropuncture set. A 5-French sheath was placed. A 5-French Cobra catheter was advanced and used to selectively catheterize the celiac axis.  Selective arteriography of the celiac axis was performed. The 5 French catheter was further advanced over a hydrophilic guidewire into the common hepatic artery and selective arteriography performed at the level of the common hepatic artery. A microcatheter was used to selectively catheterize the right hepatic artery. Selective right hepatic arteriography was performed. Radioembolization was performed with Yttrium-90 SIR Spheres. Particles were administered via a microcatheter utilizing a completely enclosed system. Monitoring of antegrade flow was performed during administration under fluoroscopy with use of contrast intermittently. After administration of the first dose of particles, the microcatheter was removed and discarded along with the attached tubing and particle vial. Arteriotomy closure was performed with the Cordis ExoSeal device. FINDINGS: Access for treatment was performed at the level of the left common femoral artery due to small residual hematoma overlying the right common femoral artery as well as ecchymosis from the prior arteriogram in the right groin region. Arteriography was necessary prior to radioembolization to confirm patency of the celiac axis, branch vessels and right hepatic artery as well as plan exact level of treatment. There was no significant change in flow pattern compared to the prior mapping study performed on 07/28/2021. There is some antegrade flow in the gastroduodenal artery with common hepatic arteriography. Previous arteriography demonstrated predominantly retrograde flow in the GDA trunk. GDA flow may need to be reassessed and occluded prior to potential future left hepatic artery radioembolization. The entire dose of yttrium 90 microspheres was able to be delivered in the right hepatic artery while maintaining antegrade flow. COMPLICATIONS: None IMPRESSION: Hepatic arterial radioembolization performed with Yttrium-90 microspheres. Right hepatic artery treatment was  performed today with administration of 27 millicuries of Q-73 activity. Electronically Signed   By: Aletta Edouard M.D.   On: 08/04/2021 12:10   IR Angiogram Visceral Selective  Result Date: 07/28/2021 INDICATION: Recurrent metastatic gallbladder carcinoma to the liver. Mapping hepatic arteriography with possible embolization and lung shunt fraction calculation is performed prior to scheduled yttrium 90 radioembolization. EXAM: 1. ULTRASOUND GUIDANCE FOR VASCULAR ACCESS OF THE RIGHT COMMON FEMORAL ARTERY 2. SELECTIVE VISCERAL ARTERIOGRAPHY OF THE CELIAC AXIS 3. SELECTIVE ARTERIOGRAPHY OF THE COMMON HEPATIC ARTERY 4. SELECTIVE ARTERIOGRAPHY OF THE GASTRODUODENAL ARTERY 5. SELECTIVE ARTERIOGRAPHY OF THE PROPER HEPATIC ARTERY 6. SELECTIVE ARTERIOGRAPHY OF THE RIGHT HEPATIC ARTERY 7. THREE-DIMENSIONAL RECONSTRUCTION OF ROTATIONAL ARTERIOGRAPHY ON SEPARATE WORKSTATION MEDICATIONS: None ANESTHESIA/SEDATION: Moderate (conscious) sedation was employed during this procedure. A total of Versed 2.0 mg and Fentanyl 150 mcg was administered intravenously by radiology nursing. Moderate Sedation Time: 104 minutes. The patient's level of consciousness and vital signs were monitored continuously by radiology nursing throughout the procedure under my direct supervision. CONTRAST:  60 mL Omnipaque 300 FLUOROSCOPY: Radiation Exposure Index (as provided by the fluoroscopic device): 716 mGy Kerma COMPLICATIONS: None immediate. PROCEDURE: Informed consent was obtained from the patient following explanation of the procedure, risks, benefits and alternatives. The patient understands, agrees and consents for the procedure. All questions were addressed. A time out was performed prior to the initiation of the procedure. Maximal barrier sterile technique utilized including caps, mask, sterile gowns, sterile gloves, large sterile drape, hand hygiene, and chlorhexidine prep. Ultrasound was utilized to confirm patency of the right common  femoral artery. A permanent ultrasound image was recorded and saved. The artery was accessed with a micropuncture set. A 5 French sheath was placed over a guidewire. A 5 French Cobra catheter was introduced over a wire into the abdominal aorta. The celiac axis was catheterized with the Cobra catheter and selective arteriography was performed at the level of the celiac axis. Attempt was made to advance the catheter over various guidewires into the celiac trunk and common hepatic artery. Ultimately, a 5 French angled glide catheter was able to be selectively advanced over a hydrophilic wire into the proper hepatic artery. Rotational selective arteriography was performed at the level of the proper hepatic artery. Rotational arteriography data was sent to a separate workstation and 3 dimensional reconstructions performed for vascular mapping during the procedure, perfusion analysis to different segments of the liver and volumetric analysis of perfused areas utilizing fusion techniques. The 5 French catheter was then retracted to the level of the common hepatic artery and selective arteriography performed. A microcatheter was advanced through the 5 French catheter and used to selectively catheterize the gastroduodenal artery. Selective arteriography was performed of the gastroduodenal artery via the microcatheter. A single 3 mm diameter, 15 cm length Ruby LP coil was advanced through the microcatheter. This was partially advanced out of the tip of the microcatheter under fluoroscopy. Microcatheter position was adjusted and additional advancement of the coil performed. Ultimately, the coil was retracted back into its deployment catheter and completely removed. The microcatheter was then used to selectively catheterize the right hepatic artery. Selective arteriography was performed. A dose of 4.3 millicuries of technetium 48mMAA was then injected via the microcatheter into the right hepatic artery. The microcatheter and 5  French catheter were then removed. Oblique arteriography was performed at the level of the femoral access site. The Cordis ExoSeal device was advanced via the femoral sheath. The device was removed, the sheath removed and hemostasis obtained with manual compression. 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 Despite stenosis, the celiac axis was able to be selectively catheterized with a 5 French catheter and ultimately selective arteriography performed. This demonstrates a patent visualized celiac trunk and patent splenic, left gastric and common hepatic arteries. The 5 French catheter was able to be advanced initially to the level of the proper hepatic artery. Rotational arteriography with reconstruction demonstrates a rounded area of central hypo-vascularity predominantly in the right lobe of the liver but also just extending into the left lobe consistent with the dominant area of tumor by prior imaging near the gallbladder fossa. Based on three-dimensional models and fusion imaging, this dominant area of tumor is predominantly supplied by anterior and posterior division branches of the right hepatic artery with additional supply from  an anterior medial segment branch of the left hepatic artery. Common hepatic arteriography demonstrated what appeared to be antegrade flow in the gastroduodenal artery. With selective catheterization and advancement of a microcatheter into the distal gastroduodenal artery, predominant flow pattern was antegrade at the level of the gastroepiploic artery. 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 Embolization of the gastroduodenal artery was therefore not performed as inflow is necessary for maintaining antegrade flow in the hepatic arteries during radioembolization treatment. There may be a tiny right gastric artery emanating at the base of the proper hepatic artery just beyond the GDA origin. This vessel is too small to catheterize. Right hepatic arteriography demonstrates normal patency of right hepatic arterial branches. MAA was injected at the level of the right hepatic artery for purposes of lung shunt fraction calculation and additional nuclear medicine imaging. The Cordis ExoSeal device did not deploy properly and hemostasis was obtained with manual compression after sheath removal. IMPRESSION: 1. Mapping hepatic arteriography prior to planned radioembolization demonstrates a large rounded area of central vascular displacement in the liver that is relatively hypovascular and consistent with the area of majority of tumor in the liver near the gallbladder fossa. This is supplied mostly by branches of the right hepatic artery and also partially by an anterior medial segment branch of the left hepatic artery. 2. The gastroduodenal artery was not embolized prior to radioembolization due to evident retrograde flow in the main trunk of the GDA. The GDA is acting as the primary collateral branch providing flow to celiac branches due to a significant celiac origin stenosis. 3. Technetium 99-m MAA was injected at the level of the right hepatic artery for additional nuclear medicine imaging and lung shunt fraction calculation. Electronically Signed   By: Aletta Edouard M.D.   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 EXAM: 1. ULTRASOUND GUIDANCE FOR VASCULAR ACCESS OF THE RIGHT COMMON FEMORAL ARTERY 2. VISCERAL SELECTIVE ARTERIOGRAPHY OF THE CELIAC AXIS 3. SELECTIVE ARTERIOGRAPHY AT THE LEVEL OF THE COMMON HEPATIC ARTERY 4. SELECTIVE ARTERIOGRAPHY AT THE LEVEL OF THE RIGHT HEPATIC ARTERY 5. TRANSCATHETER YTTRIUM-90 RADIOEMBOLIZATION OF THE RIGHT HEPATIC ARTERY FLUOROSCOPY: 12 minutes and 36 seconds.  174 mGy. MEDICATIONS AND MEDICAL HISTORY: Additional Medications: 500 mg IV Flagyl, 500 mg IV Levaquin, 8 mg IV Decadron, 40 mg IV Protonix, 8 mg IV Zofran Y-90 dose: 27 mCi ANESTHESIA/SEDATION: Moderate (conscious) sedation was employed during this procedure. A total of Versed 2.0 mg and Fentanyl 100 mcg was administered intravenously by radiology nursing. Moderate Sedation Time: 51 minutes. The patient's level of consciousness and vital signs were monitored continuously by radiology nursing throughout the procedure under my direct supervision. CONTRAST:  35 mL Omnipaque-300 PROCEDURE: The procedure, risks, benefits, and alternatives were explained to the patient. Questions regarding the procedure were encouraged and answered. The patient understands and consents to the procedure. A time-out was performed prior to initiating the procedure. The left groin was prepped with chlorhexidine in a sterile  fashion, and a sterile drape was applied covering the operative field. A sterile gown and sterile gloves were used for the procedure. Local anesthesia was provided with 1% Lidocaine. Ultrasound image documentation was performed. Ultrasound was performed of both right and left common femoral arteries. The left was chosen for access. A permanent ultrasound image was recorded to document patency and level of access. Access of the left common  femoral artery was performed under ultrasound guidance with a micropuncture set. A 5-French sheath was placed. A 5-French Cobra catheter was advanced and used to selectively catheterize the celiac axis. Selective arteriography of the celiac axis was performed. The 5 French catheter was further advanced over a hydrophilic guidewire into the common hepatic artery and selective arteriography performed at the level of the common hepatic artery. A microcatheter was used to selectively catheterize the right hepatic artery. Selective right hepatic arteriography was performed. Radioembolization was performed with Yttrium-90 SIR Spheres. Particles were administered via a microcatheter utilizing a completely enclosed system. Monitoring of antegrade flow was performed during administration under fluoroscopy with use of contrast intermittently. After administration of the first dose of particles, the microcatheter was removed and discarded along with the attached tubing and particle vial. Arteriotomy closure was performed with the Cordis ExoSeal device. FINDINGS: Access for treatment was performed at the level of the left common femoral artery due to small residual hematoma overlying the right common femoral artery as well as ecchymosis from the prior arteriogram in the right groin region. Arteriography was necessary prior to radioembolization to confirm patency of the celiac axis, branch vessels and right hepatic artery as well as plan exact level of treatment. There was no significant change in flow pattern compared to the prior mapping study performed on 07/28/2021. There is some antegrade flow in the gastroduodenal artery with common hepatic arteriography. Previous arteriography demonstrated predominantly retrograde flow in the GDA trunk. GDA flow may need to be reassessed and occluded prior to potential future left hepatic artery radioembolization. The entire dose of yttrium 90 microspheres was able to be delivered in the  right hepatic artery while maintaining antegrade flow. COMPLICATIONS: None IMPRESSION: Hepatic arterial radioembolization performed with Yttrium-90 microspheres. Right hepatic artery treatment was performed today with administration of 27 millicuries of O-29 activity. Electronically Signed   By: Aletta Edouard M.D.   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 EXAM: 1. ULTRASOUND GUIDANCE FOR VASCULAR ACCESS OF THE RIGHT COMMON FEMORAL ARTERY 2. VISCERAL SELECTIVE ARTERIOGRAPHY OF THE CELIAC AXIS 3. SELECTIVE ARTERIOGRAPHY AT THE LEVEL OF THE COMMON HEPATIC ARTERY 4. SELECTIVE ARTERIOGRAPHY AT THE LEVEL OF THE RIGHT HEPATIC ARTERY 5. TRANSCATHETER YTTRIUM-90 RADIOEMBOLIZATION OF THE RIGHT HEPATIC ARTERY FLUOROSCOPY: 12 minutes and 36 seconds.  174 mGy. MEDICATIONS AND MEDICAL HISTORY: Additional Medications: 500 mg IV Flagyl, 500 mg IV Levaquin, 8 mg IV Decadron, 40 mg IV Protonix, 8 mg IV Zofran Y-90 dose: 27 mCi ANESTHESIA/SEDATION: Moderate (conscious) sedation was employed during this procedure. A total of Versed 2.0 mg and Fentanyl 100 mcg was administered intravenously by radiology nursing. Moderate Sedation Time: 51 minutes. The patient's level of consciousness and vital signs were monitored continuously by radiology nursing throughout the procedure under my direct supervision. CONTRAST:  35 mL Omnipaque-300 PROCEDURE: The procedure, risks, benefits, and alternatives were explained to the patient. Questions regarding the procedure were encouraged and  answered. The patient understands and consents to the procedure. A time-out was performed prior to initiating the procedure. The left groin was prepped with chlorhexidine in a sterile fashion, and a sterile drape was applied covering the operative field. A sterile  gown and sterile gloves were used for the procedure. Local anesthesia was provided with 1% Lidocaine. Ultrasound image documentation was performed. Ultrasound was performed of both right and left common femoral arteries. The left was chosen for access. A permanent ultrasound image was recorded to document patency and level of access. Access of the left common femoral artery was performed under ultrasound guidance with a micropuncture set. A 5-French sheath was placed. A 5-French Cobra catheter was advanced and used to selectively catheterize the celiac axis. Selective arteriography of the celiac axis was performed. The 5 French catheter was further advanced over a hydrophilic guidewire into the common hepatic artery and selective arteriography performed at the level of the common hepatic artery. A microcatheter was used to selectively catheterize the right hepatic artery. Selective right hepatic arteriography was performed. Radioembolization was performed with Yttrium-90 SIR Spheres. Particles were administered via a microcatheter utilizing a completely enclosed system. Monitoring of antegrade flow was performed during administration under fluoroscopy with use of contrast intermittently. After administration of the first dose of particles, the microcatheter was removed and discarded along with the attached tubing and particle vial. Arteriotomy closure was performed with the Cordis ExoSeal device. FINDINGS: Access for treatment was performed at the level of the left common femoral artery due to small residual hematoma overlying the right common femoral artery as well as ecchymosis from the prior arteriogram in the right groin region. Arteriography was necessary prior to radioembolization to confirm patency of the celiac axis, branch vessels and right hepatic artery as well as plan exact level of treatment. There was no significant change in flow pattern compared to the prior mapping study performed on 07/28/2021.  There is some antegrade flow in the gastroduodenal artery with common hepatic arteriography. Previous arteriography demonstrated predominantly retrograde flow in the GDA trunk. GDA flow may need to be reassessed and occluded prior to potential future left hepatic artery radioembolization. The entire dose of yttrium 90 microspheres was able to be delivered in the right hepatic artery while maintaining antegrade flow. COMPLICATIONS: None IMPRESSION: Hepatic arterial radioembolization performed with Yttrium-90 microspheres. Right hepatic artery treatment was performed today with administration of 27 millicuries of F-75 activity. Electronically Signed   By: Aletta Edouard M.D.   On: 08/04/2021 12:10   IR Angiogram Selective Each Additional Vessel  Result Date: 07/28/2021 INDICATION: Recurrent metastatic gallbladder carcinoma to the liver. Mapping hepatic arteriography with possible embolization and lung shunt fraction calculation is performed prior to scheduled yttrium 90 radioembolization. EXAM: 1. ULTRASOUND GUIDANCE FOR VASCULAR ACCESS OF THE RIGHT COMMON FEMORAL ARTERY 2. SELECTIVE VISCERAL ARTERIOGRAPHY OF THE CELIAC AXIS 3. SELECTIVE ARTERIOGRAPHY OF THE COMMON HEPATIC ARTERY 4. SELECTIVE ARTERIOGRAPHY OF THE GASTRODUODENAL ARTERY 5. SELECTIVE ARTERIOGRAPHY OF THE PROPER HEPATIC ARTERY 6. SELECTIVE ARTERIOGRAPHY OF THE RIGHT HEPATIC ARTERY 7. THREE-DIMENSIONAL RECONSTRUCTION OF ROTATIONAL ARTERIOGRAPHY ON SEPARATE WORKSTATION MEDICATIONS: None ANESTHESIA/SEDATION: Moderate (conscious) sedation was employed during this procedure. A total of Versed 2.0 mg and Fentanyl 150 mcg was administered intravenously by radiology nursing. Moderate Sedation Time: 104 minutes. The patient's level of consciousness and vital signs were monitored continuously by radiology nursing throughout the procedure under my direct supervision. CONTRAST:  60 mL Omnipaque 300 FLUOROSCOPY: Radiation Exposure Index (as provided by the  fluoroscopic device): 102  mGy Kerma COMPLICATIONS: None immediate. PROCEDURE: Informed consent was obtained from the patient following explanation of the procedure, risks, benefits and alternatives. The patient understands, agrees and consents for the procedure. All questions were addressed. A time out was performed prior to the initiation of the procedure. Maximal barrier sterile technique utilized including caps, mask, sterile gowns, sterile gloves, large sterile drape, hand hygiene, and chlorhexidine prep. Ultrasound was utilized to confirm patency of the right common femoral artery. A permanent ultrasound image was recorded and saved. The artery was accessed with a micropuncture set. A 5 French sheath was placed over a guidewire. A 5 French Cobra catheter was introduced over a wire into the abdominal aorta. The celiac axis was catheterized with the Cobra catheter and selective arteriography was performed at the level of the celiac axis. Attempt was made to advance the catheter over various guidewires into the celiac trunk and common hepatic artery. Ultimately, a 5 French angled glide catheter was able to be selectively advanced over a hydrophilic wire into the proper hepatic artery. Rotational selective arteriography was performed at the level of the proper hepatic artery. Rotational arteriography data was sent to a separate workstation and 3 dimensional reconstructions performed for vascular mapping during the procedure, perfusion analysis to different segments of the liver and volumetric analysis of perfused areas utilizing fusion techniques. The 5 French catheter was then retracted to the level of the common hepatic artery and selective arteriography performed. A microcatheter was advanced through the 5 French catheter and used to selectively catheterize the gastroduodenal artery. Selective arteriography was performed of the gastroduodenal artery via the microcatheter. A single 3 mm diameter, 15 cm length  Ruby LP coil was advanced through the microcatheter. This was partially advanced out of the tip of the microcatheter under fluoroscopy. Microcatheter position was adjusted and additional advancement of the coil performed. Ultimately, the coil was retracted back into its deployment catheter and completely removed. The microcatheter was then used to selectively catheterize the right hepatic artery. Selective arteriography was performed. A dose of 4.3 millicuries of technetium 26mMAA was then injected via the microcatheter into the right hepatic artery. The microcatheter and 5 French catheter were then removed. Oblique arteriography was performed at the level of the femoral access site. The Cordis ExoSeal device was advanced via the femoral sheath. The device was removed, the sheath removed and hemostasis obtained with manual compression. 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 Despite stenosis, the celiac axis was able to be selectively catheterized with a 5 French catheter and ultimately selective arteriography performed. This demonstrates a patent visualized celiac trunk and patent splenic, left gastric and common hepatic arteries. The 5 French catheter was able to be advanced initially to the level of the proper hepatic artery. Rotational arteriography with reconstruction demonstrates a rounded area of central hypo-vascularity predominantly in the right lobe of the liver but also just extending into the left lobe consistent with the dominant area of tumor by prior imaging near the gallbladder fossa. Based on three-dimensional models and fusion imaging, this dominant area of tumor is predominantly supplied by anterior and posterior division branches of  the right hepatic artery with additional supply from an anterior medial segment branch of the left hepatic artery. Common hepatic arteriography demonstrated what  appeared to be antegrade flow in the gastroduodenal artery. With selective catheterization and advancement of a microcatheter into the distal gastroduodenal artery, predominant flow pattern was antegrade at the level of the gastroepiploic artery. 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 Embolization of the gastroduodenal artery was therefore not performed as inflow is necessary for maintaining antegrade flow in the hepatic arteries during radioembolization treatment. There may be a tiny right gastric artery emanating at the base of the proper hepatic artery just beyond the GDA origin. This vessel is too small to catheterize. Right hepatic arteriography demonstrates normal patency of right hepatic arterial branches. MAA was injected at the level of the right hepatic artery for purposes of lung shunt fraction calculation and additional nuclear medicine imaging. The Cordis ExoSeal device did not deploy properly and hemostasis was obtained with manual compression after sheath removal. IMPRESSION: 1. Mapping hepatic arteriography prior to planned radioembolization demonstrates a large rounded area of central vascular displacement in the liver that is relatively hypovascular and consistent with the area of majority of tumor in the liver near the gallbladder fossa. This is supplied mostly by branches of the right hepatic artery and also  partially by an anterior medial segment branch of the left hepatic artery. 2. The gastroduodenal artery was not embolized prior to radioembolization due to evident retrograde flow in the main trunk of the GDA. The GDA is acting as the primary collateral branch providing flow to celiac branches due to a significant celiac origin stenosis. 3. Technetium 99-m MAA was injected at the level of the right hepatic artery for additional nuclear medicine imaging and lung shunt fraction calculation. Electronically Signed   By: Aletta Edouard M.D.   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 Mapping hepatic arteriography with possible embolization and lung shunt fraction calculation is performed prior to scheduled yttrium 90 radioembolization. EXAM: 1. ULTRASOUND GUIDANCE FOR VASCULAR ACCESS OF THE RIGHT COMMON FEMORAL ARTERY 2. SELECTIVE VISCERAL ARTERIOGRAPHY OF THE CELIAC AXIS 3. SELECTIVE ARTERIOGRAPHY OF THE COMMON HEPATIC ARTERY 4. SELECTIVE ARTERIOGRAPHY OF THE GASTRODUODENAL ARTERY 5. SELECTIVE ARTERIOGRAPHY OF THE PROPER HEPATIC ARTERY 6. SELECTIVE ARTERIOGRAPHY OF THE RIGHT HEPATIC ARTERY 7. THREE-DIMENSIONAL RECONSTRUCTION OF ROTATIONAL ARTERIOGRAPHY ON SEPARATE WORKSTATION MEDICATIONS: None ANESTHESIA/SEDATION: Moderate (conscious) sedation was employed during this procedure. A total of Versed 2.0 mg and Fentanyl 150 mcg was administered intravenously by radiology nursing. Moderate Sedation Time: 104 minutes. The patient's level of consciousness and vital signs were monitored continuously by radiology nursing throughout the procedure under my direct supervision. CONTRAST:  60 mL Omnipaque 300 FLUOROSCOPY: Radiation Exposure Index (as provided by the fluoroscopic device): 725 mGy Kerma COMPLICATIONS: None immediate. PROCEDURE: Informed consent was obtained from the patient following explanation  of the procedure, risks, benefits and alternatives. The patient understands, agrees and consents for the procedure. All questions were addressed. A time out was performed prior to the initiation of the procedure. Maximal barrier sterile technique utilized including caps, mask, sterile gowns, sterile gloves, large sterile drape, hand hygiene, and chlorhexidine prep. Ultrasound was utilized to confirm  patency of the right common femoral artery. A permanent ultrasound image was recorded and saved. The artery was accessed with a micropuncture set. A 5 French sheath was placed over a guidewire. A 5 French Cobra catheter was introduced over a wire into the abdominal aorta. The celiac axis was catheterized with the Cobra catheter and selective arteriography was performed at the level of the celiac axis. Attempt was made to advance the catheter over various guidewires into the celiac trunk and common hepatic artery. Ultimately, a 5 French angled glide catheter was able to be selectively advanced over a hydrophilic wire into the proper hepatic artery. Rotational selective arteriography was performed at the level of the proper hepatic artery. Rotational arteriography data was sent to a separate workstation and 3 dimensional reconstructions performed for vascular mapping during the procedure, perfusion analysis to different segments of the liver and volumetric analysis of perfused areas utilizing fusion techniques. The 5 French catheter was then retracted to the level of the common hepatic artery and selective arteriography performed. A microcatheter was advanced through the 5 French catheter and used to selectively catheterize the gastroduodenal artery. Selective arteriography was performed of the gastroduodenal artery via the microcatheter. A single 3 mm diameter, 15 cm length Ruby LP coil was advanced through the microcatheter. This was partially advanced out of the tip of the microcatheter under fluoroscopy. Microcatheter  position was adjusted and additional advancement of the coil performed. Ultimately, the coil was retracted back into its deployment catheter and completely removed. The microcatheter was then used to selectively catheterize the right hepatic artery. Selective arteriography was performed. A dose of 4.3 millicuries of technetium 53mMAA was then injected via the microcatheter into the right hepatic artery. The microcatheter and 5 French catheter were then removed. Oblique arteriography was performed at the level of the femoral access site. The Cordis ExoSeal device was advanced via the femoral sheath. The device was removed, the sheath removed and hemostasis obtained with manual compression. 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 Despite stenosis, the celiac axis was able to be selectively catheterized with a 5 French catheter and ultimately selective arteriography performed. This demonstrates a patent visualized celiac trunk and patent splenic, left gastric and common hepatic arteries. The 5 French catheter was able to be advanced initially to the level of the proper hepatic artery. Rotational arteriography with reconstruction demonstrates a rounded area of central hypo-vascularity predominantly in the right lobe of the liver but also just extending into the left lobe consistent with the dominant area of tumor by prior imaging near the gallbladder fossa. Based on three-dimensional models and fusion imaging, this dominant area of tumor is predominantly supplied by anterior and posterior division branches of the right hepatic artery with additional supply from an anterior medial segment branch of the left hepatic artery. Common hepatic arteriography  demonstrated what appeared to be antegrade flow in the gastroduodenal artery. With selective catheterization and advancement of a microcatheter into the distal gastroduodenal artery, predominant flow pattern was antegrade at the level of the gastroepiploic artery. 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 Embolization of the gastroduodenal artery was therefore not performed as inflow is necessary for maintaining antegrade flow in the hepatic arteries during radioembolization treatment. There may be a tiny right gastric artery emanating at the base of the proper hepatic artery just beyond the GDA origin. This vessel is too small to catheterize. Right hepatic arteriography demonstrates normal patency of right hepatic arterial branches. MAA was injected at the level of the right hepatic artery for purposes of lung shunt fraction calculation and additional nuclear medicine imaging. The Cordis ExoSeal device did not deploy properly and hemostasis was obtained with manual compression after sheath removal. IMPRESSION: 1. Mapping hepatic arteriography prior to planned radioembolization demonstrates a large rounded area of central vascular displacement in the liver that is relatively hypovascular and consistent with the area of majority of tumor in the liver near the gallbladder fossa. This is supplied mostly by branches of the right hepatic artery and also partially by an anterior medial segment branch of the left hepatic artery. 2. The gastroduodenal artery was not embolized prior to  radioembolization due to evident retrograde flow in the main trunk of the GDA. The GDA is acting as the primary collateral branch providing flow to celiac branches due to a significant celiac origin stenosis. 3. Technetium 99-m MAA was injected at the level of the right hepatic artery for additional nuclear medicine imaging and lung shunt fraction calculation. Electronically Signed   By: Aletta Edouard M.D.   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 Mapping hepatic arteriography with possible embolization and lung shunt fraction calculation is performed prior to scheduled yttrium 90 radioembolization. EXAM: 1. ULTRASOUND GUIDANCE FOR VASCULAR ACCESS OF THE RIGHT COMMON FEMORAL ARTERY 2. SELECTIVE VISCERAL ARTERIOGRAPHY OF THE CELIAC AXIS 3. SELECTIVE ARTERIOGRAPHY OF THE COMMON HEPATIC ARTERY 4. SELECTIVE ARTERIOGRAPHY OF THE GASTRODUODENAL ARTERY 5. SELECTIVE ARTERIOGRAPHY OF THE PROPER HEPATIC ARTERY 6. SELECTIVE ARTERIOGRAPHY OF THE RIGHT HEPATIC ARTERY 7. THREE-DIMENSIONAL RECONSTRUCTION OF ROTATIONAL ARTERIOGRAPHY ON SEPARATE WORKSTATION MEDICATIONS: None ANESTHESIA/SEDATION: Moderate (conscious) sedation was employed during this procedure. A total of Versed 2.0 mg and Fentanyl 150 mcg was administered intravenously by radiology nursing. Moderate Sedation Time: 104 minutes. The patient's level of consciousness and vital signs were monitored continuously by radiology nursing throughout the procedure under my direct supervision. CONTRAST:  60 mL Omnipaque 300 FLUOROSCOPY: Radiation Exposure Index (as provided by the fluoroscopic device): 161 mGy Kerma COMPLICATIONS: None immediate. PROCEDURE: Informed consent was obtained from the patient following explanation of the procedure, risks, benefits and alternatives. The patient understands, agrees and consents for the procedure. All questions  were addressed. A time out was performed prior to the initiation of the procedure. Maximal barrier sterile technique utilized including caps, mask, sterile gowns, sterile gloves, large sterile drape, hand hygiene, and chlorhexidine prep. Ultrasound was utilized to confirm patency of the right common femoral artery. A permanent ultrasound image was recorded and saved. The artery was accessed with a micropuncture set. A 5 French sheath was placed over a guidewire. A 5 French Cobra catheter was introduced over a wire into the abdominal aorta. The celiac axis was catheterized with the Cobra catheter and selective arteriography was performed at the level of the celiac axis. Attempt was made to advance  the catheter over various guidewires into the celiac trunk and common hepatic artery. Ultimately, a 5 French angled glide catheter was able to be selectively advanced over a hydrophilic wire into the proper hepatic artery. Rotational selective arteriography was performed at the level of the proper hepatic artery. Rotational arteriography data was sent to a separate workstation and 3 dimensional reconstructions performed for vascular mapping during the procedure, perfusion analysis to different segments of the liver and volumetric analysis of perfused areas utilizing fusion techniques. The 5 French catheter was then retracted to the level of the common hepatic artery and selective arteriography performed. A microcatheter was advanced through the 5 French catheter and used to selectively catheterize the gastroduodenal artery. Selective arteriography was performed of the gastroduodenal artery via the microcatheter. A single 3 mm diameter, 15 cm length Ruby LP coil was advanced through the microcatheter. This was partially advanced out of the tip of the microcatheter under fluoroscopy. Microcatheter position was adjusted and additional advancement of the coil performed. Ultimately, the coil was retracted back into its  deployment catheter and completely removed. The microcatheter was then used to selectively catheterize the right hepatic artery. Selective arteriography was performed. A dose of 4.3 millicuries of technetium 33mMAA was then injected via the microcatheter into the right hepatic artery. The microcatheter and 5 French catheter were then removed. Oblique arteriography was performed at the level of the femoral access site. The Cordis ExoSeal device was advanced via the femoral sheath. The device was removed, the sheath removed and hemostasis obtained with manual compression. 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 Despite stenosis, the celiac axis was able to be selectively catheterized with a 5 French catheter and ultimately selective arteriography performed. This demonstrates a patent visualized celiac trunk and patent splenic, left gastric and common hepatic arteries. The 5 French catheter was able to be advanced initially to the level of the proper hepatic artery. Rotational arteriography with reconstruction demonstrates a rounded area of central hypo-vascularity predominantly in the right lobe of the liver but also just extending into the left lobe consistent with the dominant area of tumor by prior imaging near the gallbladder fossa. Based on three-dimensional models and fusion imaging, this dominant area of tumor is predominantly supplied by anterior and posterior division branches of the right hepatic artery with additional supply from an anterior medial segment branch of the left hepatic artery. Common hepatic arteriography demonstrated what appeared to be antegrade flow in the gastroduodenal artery. With selective catheterization and advancement of  a microcatheter into the distal gastroduodenal artery, predominant flow pattern was antegrade at the level of the gastroepiploic artery. 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 Embolization of the gastroduodenal artery was therefore not performed as inflow is necessary for maintaining antegrade flow in the hepatic arteries during radioembolization treatment. There may be a tiny right gastric artery emanating at the base of the proper hepatic  artery just beyond the GDA origin. This vessel is too small to catheterize. Right hepatic arteriography demonstrates normal patency of right hepatic arterial branches. MAA was injected at the level of the right hepatic artery for purposes of lung shunt fraction calculation and additional nuclear medicine imaging. The Cordis ExoSeal device did not deploy properly and hemostasis was obtained with manual compression after sheath removal. IMPRESSION: 1. Mapping hepatic arteriography prior to planned radioembolization demonstrates a large rounded area of central vascular displacement in the liver that is relatively hypovascular and consistent with the area of majority of tumor in the liver near the gallbladder fossa. This is supplied mostly by branches of the right hepatic artery and also partially by an anterior medial segment branch of the left hepatic artery. 2. The gastroduodenal artery was not embolized prior to radioembolization due to evident retrograde flow in the main trunk of the GDA. The GDA is acting as the primary collateral branch providing  flow to celiac branches due to a significant celiac origin stenosis. 3. Technetium 99-m MAA was injected at the level of the right hepatic artery for additional nuclear medicine imaging and lung shunt fraction calculation. Electronically Signed   By: Aletta Edouard M.D.   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 Mapping hepatic arteriography with possible embolization and lung shunt fraction calculation is performed prior to scheduled yttrium 90 radioembolization. EXAM: 1. ULTRASOUND GUIDANCE FOR VASCULAR ACCESS OF THE RIGHT COMMON FEMORAL ARTERY 2. SELECTIVE VISCERAL ARTERIOGRAPHY OF THE CELIAC AXIS 3. SELECTIVE ARTERIOGRAPHY OF THE COMMON HEPATIC ARTERY 4. SELECTIVE ARTERIOGRAPHY OF THE GASTRODUODENAL ARTERY 5. SELECTIVE ARTERIOGRAPHY OF THE PROPER HEPATIC ARTERY 6. SELECTIVE ARTERIOGRAPHY OF THE RIGHT HEPATIC ARTERY 7. THREE-DIMENSIONAL RECONSTRUCTION OF ROTATIONAL ARTERIOGRAPHY ON SEPARATE WORKSTATION MEDICATIONS: None ANESTHESIA/SEDATION: Moderate (conscious) sedation was employed during this procedure. A total of Versed 2.0 mg and Fentanyl 150 mcg was administered intravenously by radiology nursing. Moderate Sedation Time: 104 minutes. The patient's level of consciousness and vital signs were monitored continuously by radiology nursing throughout the procedure under my direct supervision. CONTRAST:  60 mL Omnipaque 300 FLUOROSCOPY: Radiation Exposure Index (as provided by the fluoroscopic device): 416 mGy Kerma COMPLICATIONS: None immediate. PROCEDURE: Informed consent was obtained from the patient following explanation of the procedure, risks, benefits and alternatives. The patient understands, agrees and consents for the procedure. All questions were addressed. A time out was performed prior to the initiation of the procedure. Maximal barrier sterile technique utilized including caps,  mask, sterile gowns, sterile gloves, large sterile drape, hand hygiene, and chlorhexidine prep. Ultrasound was utilized to confirm patency of the right common femoral artery. A permanent ultrasound image was recorded and saved. The artery was accessed with a micropuncture set. A 5 French sheath was placed over a guidewire. A 5 French Cobra catheter was introduced over a wire into the abdominal aorta. The celiac axis was catheterized with the Cobra catheter and selective arteriography was performed at the level of the celiac axis. Attempt was made to advance the catheter over various guidewires into the celiac trunk and common hepatic artery. Ultimately, a 5 French angled glide catheter was able to be selectively advanced over a hydrophilic wire into the proper hepatic artery. Rotational selective arteriography was performed at the level of the proper hepatic artery. Rotational arteriography data was sent to a separate workstation and 3 dimensional reconstructions performed for vascular mapping during the procedure, perfusion analysis to different  segments of the liver and volumetric analysis of perfused areas utilizing fusion techniques. The 5 French catheter was then retracted to the level of the common hepatic artery and selective arteriography performed. A microcatheter was advanced through the 5 French catheter and used to selectively catheterize the gastroduodenal artery. Selective arteriography was performed of the gastroduodenal artery via the microcatheter. A single 3 mm diameter, 15 cm length Ruby LP coil was advanced through the microcatheter. This was partially advanced out of the tip of the microcatheter under fluoroscopy. Microcatheter position was adjusted and additional advancement of the coil performed. Ultimately, the coil was retracted back into its deployment catheter and completely removed. The microcatheter was then used to selectively catheterize the right hepatic artery. Selective  arteriography was performed. A dose of 4.3 millicuries of technetium 55mMAA was then injected via the microcatheter into the right hepatic artery. The microcatheter and 5 French catheter were then removed. Oblique arteriography was performed at the level of the femoral access site. The Cordis ExoSeal device was advanced via the femoral sheath. The device was removed, the sheath removed and hemostasis obtained with manual compression. 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 Despite stenosis, the celiac axis was able to be selectively catheterized with a 5 French catheter and ultimately selective arteriography performed. This demonstrates a patent visualized celiac trunk and patent splenic, left gastric and common hepatic arteries. The 5 French catheter was able to be advanced initially to the level of the proper hepatic artery. Rotational arteriography with reconstruction demonstrates a rounded area of central hypo-vascularity predominantly in the right lobe of the liver but also just extending into the left lobe consistent with the dominant area of tumor by prior imaging near the gallbladder fossa. Based on three-dimensional models and fusion imaging, this dominant area of tumor is predominantly supplied by anterior and posterior division branches of the right hepatic artery with additional supply from an anterior medial segment branch of the left hepatic artery. Common hepatic arteriography demonstrated what appeared to be antegrade flow in the gastroduodenal artery. With selective catheterization and advancement of a microcatheter into the distal gastroduodenal artery, predominant flow pattern was antegrade at the level of the gastroepiploic artery.  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 Embolization of the gastroduodenal artery was therefore not performed as inflow is necessary for maintaining antegrade flow in the hepatic arteries during radioembolization treatment. There may be a tiny right gastric artery emanating at the base of the proper hepatic artery just beyond the GDA origin. This vessel is too small to catheterize. Right hepatic arteriography demonstrates normal patency of right hepatic arterial branches. MAA was injected at the level of the right hepatic artery for purposes of lung shunt fraction calculation and additional nuclear medicine imaging. The Cordis ExoSeal device did not deploy properly and hemostasis was obtained with manual compression after sheath removal. IMPRESSION: 1. Mapping hepatic arteriography prior to  planned radioembolization demonstrates a large rounded area of central vascular displacement in the liver that is relatively hypovascular and consistent with the area of majority of tumor in the liver near the gallbladder fossa. This is supplied mostly by branches of the right hepatic artery and also partially by an anterior medial segment branch of the left hepatic artery. 2. The gastroduodenal artery was not embolized prior to radioembolization due to evident retrograde flow in the main trunk of the GDA. The GDA is acting as the primary collateral branch providing flow to celiac branches due to a significant celiac origin stenosis. 3. Technetium 99-m MAA was injected at the level of the right hepatic  artery for additional nuclear medicine imaging and lung shunt fraction calculation. Electronically Signed   By: Aletta Edouard M.D.   On: 07/28/2021 16:05   NM Special Med Rad Physics Cons  Result Date: 08/05/2021 CLINICAL DATA:  Gallbladder carcinoma with liver metastasis. Microsphere radio therapy to the RIGHT hepatic lobe. EXAM: NUCLEAR MEDICINE SPECIAL MED RAD PHYSICS CONS; NUCLEAR MEDICINE RADIO PHARM THERAPY INTRA ARTERIAL; NUCLEAR MEDICINE TREATMENT PROCEDURE; NUCLEAR MEDICINE LIVER SCAN TECHNIQUE: In conjunction with the interventional radiologist a Y- Microsphere dose was calculated utilizing body surface area formulation as well as partition formulation. Post processing volume metrics performed. Calculated dose equal 28.39 mCi. Pre therapy MAA liver SPECT scan and CTA were evaluated. Utilizing a microcatheter system, the hepatic artery was selected and Y-90 microspheres were delivered in fractionated aliquots. Radiopharmaceutical was delivered by the interventional radiologist and nuclear radiologist. The patient tolerated procedure well. No adverse effects were noted. Bremsstrahlung planar and SPECT imaging of the abdomen following intrahepatic arterial delivery of Y-90 microsphere was performed. RADIOPHARMACEUTICALS:  46.2 millicuries yttrium 90 microspheres COMPARISON:  MAA scan 07/28/2021, MRI 05/25/2021 FINDINGS: Y - 90 microspheres therapy as above. First therapy the right hepatic lobe. Bremsstrahlung planar and SPECT imaging of the abdomen following intrahepatic arterial delivery of [id number] demonstrates radioactivity localized to the RIGHT hepatic lobe. No evidence of extrahepatic activity. IMPRESSION: Successful Y - 90 microsphere delivery for treatment of unresectable liver metastasis. First therapy to the RIGHT lobe. Bremssstrahlung scan demonstrates activity localized to RIGHT hepatic lobe with no extrahepatic activity identified. Electronically Signed   By: SSuzy BouchardM.D.    On: 08/05/2021 14:08   NM Special Treatment Procedure  Result Date: 08/05/2021 CLINICAL DATA:  Gallbladder carcinoma with liver metastasis. Microsphere radio therapy to the RIGHT hepatic lobe. EXAM: NUCLEAR MEDICINE SPECIAL MED RAD PHYSICS CONS; NUCLEAR MEDICINE RADIO PHARM THERAPY INTRA ARTERIAL; NUCLEAR MEDICINE TREATMENT PROCEDURE; NUCLEAR MEDICINE LIVER SCAN TECHNIQUE: In conjunction with the interventional radiologist a Y- Microsphere dose was calculated utilizing body surface area formulation as well as partition formulation. Post processing volume metrics performed. Calculated dose equal 28.39 mCi. Pre therapy MAA liver SPECT scan and CTA were evaluated. Utilizing a microcatheter system, the hepatic artery was selected and Y-90 microspheres were delivered in fractionated aliquots. Radiopharmaceutical was delivered by the interventional radiologist and nuclear radiologist. The patient tolerated procedure well. No adverse effects were noted. Bremsstrahlung planar and SPECT imaging of the abdomen following intrahepatic arterial delivery of Y-90 microsphere was performed. RADIOPHARMACEUTICALS:  270.3millicuries yttrium 90 microspheres COMPARISON:  MAA scan 07/28/2021, MRI 05/25/2021 FINDINGS: Y - 90 microspheres therapy as above. First therapy the right hepatic lobe. Bremsstrahlung planar and SPECT imaging of the abdomen following intrahepatic arterial delivery of [id number] demonstrates radioactivity localized to the RIGHT hepatic lobe. No evidence of extrahepatic activity. IMPRESSION: Successful Y - 90 microsphere delivery  for treatment of unresectable liver metastasis. First therapy to the RIGHT lobe. Bremssstrahlung scan demonstrates activity localized to RIGHT hepatic lobe with no extrahepatic activity identified. Electronically Signed   By: Suzy Bouchard M.D.   On: 08/05/2021 14:08   IR 3D Independent Darreld Mclean  Result Date: 07/28/2021 INDICATION: Recurrent metastatic gallbladder carcinoma to the  liver. Mapping hepatic arteriography with possible embolization and lung shunt fraction calculation is performed prior to scheduled yttrium 90 radioembolization. EXAM: 1. ULTRASOUND GUIDANCE FOR VASCULAR ACCESS OF THE RIGHT COMMON FEMORAL ARTERY 2. SELECTIVE VISCERAL ARTERIOGRAPHY OF THE CELIAC AXIS 3. SELECTIVE ARTERIOGRAPHY OF THE COMMON HEPATIC ARTERY 4. SELECTIVE ARTERIOGRAPHY OF THE GASTRODUODENAL ARTERY 5. SELECTIVE ARTERIOGRAPHY OF THE PROPER HEPATIC ARTERY 6. SELECTIVE ARTERIOGRAPHY OF THE RIGHT HEPATIC ARTERY 7. THREE-DIMENSIONAL RECONSTRUCTION OF ROTATIONAL ARTERIOGRAPHY ON SEPARATE WORKSTATION MEDICATIONS: None ANESTHESIA/SEDATION: Moderate (conscious) sedation was employed during this procedure. A total of Versed 2.0 mg and Fentanyl 150 mcg was administered intravenously by radiology nursing. Moderate Sedation Time: 104 minutes. The patient's level of consciousness and vital signs were monitored continuously by radiology nursing throughout the procedure under my direct supervision. CONTRAST:  60 mL Omnipaque 300 FLUOROSCOPY: Radiation Exposure Index (as provided by the fluoroscopic device): 412 mGy Kerma COMPLICATIONS: None immediate. PROCEDURE: Informed consent was obtained from the patient following explanation of the procedure, risks, benefits and alternatives. The patient understands, agrees and consents for the procedure. All questions were addressed. A time out was performed prior to the initiation of the procedure. Maximal barrier sterile technique utilized including caps, mask, sterile gowns, sterile gloves, large sterile drape, hand hygiene, and chlorhexidine prep. Ultrasound was utilized to confirm patency of the right common femoral artery. A permanent ultrasound image was recorded and saved. The artery was accessed with a micropuncture set. A 5 French sheath was placed over a guidewire. A 5 French Cobra catheter was introduced over a wire into the abdominal aorta. The celiac axis was  catheterized with the Cobra catheter and selective arteriography was performed at the level of the celiac axis. Attempt was made to advance the catheter over various guidewires into the celiac trunk and common hepatic artery. Ultimately, a 5 French angled glide catheter was able to be selectively advanced over a hydrophilic wire into the proper hepatic artery. Rotational selective arteriography was performed at the level of the proper hepatic artery. Rotational arteriography data was sent to a separate workstation and 3 dimensional reconstructions performed for vascular mapping during the procedure, perfusion analysis to different segments of the liver and volumetric analysis of perfused areas utilizing fusion techniques. The 5 French catheter was then retracted to the level of the common hepatic artery and selective arteriography performed. A microcatheter was advanced through the 5 French catheter and used to selectively catheterize the gastroduodenal artery. Selective arteriography was performed of the gastroduodenal artery via the microcatheter. A single 3 mm diameter, 15 cm length Ruby LP coil was advanced through the microcatheter. This was partially advanced out of the tip of the microcatheter under fluoroscopy. Microcatheter position was adjusted and additional advancement of the coil performed. Ultimately, the coil was retracted back into its deployment catheter and completely removed. The microcatheter was then used to selectively catheterize the right hepatic artery. Selective arteriography was performed. A dose of 4.3 millicuries of technetium 46mMAA was then injected via the microcatheter into the right hepatic artery. The microcatheter and 5 French catheter were then removed. Oblique arteriography was performed at the level of the femoral access site. The Cordis ExoSeal device was  advanced via the femoral sheath. The device was removed, the sheath removed and hemostasis obtained with manual  compression. 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 Despite stenosis, the celiac axis was able to be selectively catheterized with a 5 French catheter and ultimately selective arteriography performed. This demonstrates a patent visualized celiac trunk and patent splenic, left gastric and common hepatic arteries. The 5 French catheter was able to be advanced initially to the level of the proper hepatic artery. Rotational arteriography with reconstruction demonstrates a rounded area of central hypo-vascularity predominantly in the right lobe of the liver but also just extending into the left lobe consistent with the dominant area of tumor by prior imaging near the gallbladder fossa. Based on three-dimensional models and fusion imaging, this dominant area of tumor is predominantly supplied by anterior and posterior division branches of the right hepatic artery with additional supply from an anterior medial segment branch of the left hepatic artery. Common hepatic arteriography demonstrated what appeared to be antegrade flow in the gastroduodenal artery. With selective catheterization and advancement of a microcatheter into the distal gastroduodenal artery, predominant flow pattern was antegrade at the level of the gastroepiploic artery. However, with partial deployment of an embolization coil, the coil clearly was traveling with flow in a retrograde direction along the shaft of the microcatheter and as the microcatheter was retracted, it became evident that the main trunk of the gastroduodenal artery demonstrates retrograde flow via pancreaticoduodenal collaterals from the SMA and is likely the predominant flow pattern to celiac branches given evidence of a  probable significant celiac origin stenosis. Embolization of the gastroduodenal artery was therefore not performed as inflow is necessary for maintaining antegrade flow in the hepatic arteries during radioembolization treatment. There may be a tiny right gastric artery emanating at the base of the proper hepatic artery just beyond the GDA origin. This vessel is too small to catheterize. Right hepatic arteriography demonstrates normal patency of right hepatic arterial branches. MAA was injected at the level of the right hepatic artery for purposes of lung shunt fraction calculation and additional nuclear medicine imaging. The Cordis ExoSeal device did not deploy properly and hemostasis was obtained with manual compression after sheath removal. IMPRESSION: 1. Mapping hepatic arteriography prior to planned radioembolization demonstrates a large rounded area of central vascular displacement in the liver that is relatively hypovascular and consistent with the area of majority of tumor in the liver near the gallbladder fossa. This is supplied mostly by branches of the right hepatic artery and also partially by an anterior medial segment branch of the left hepatic artery. 2. The gastroduodenal artery was not embolized prior to radioembolization due to evident retrograde flow in the main trunk of the GDA. The GDA is acting as the primary collateral branch providing flow to celiac branches due to a significant celiac origin stenosis. 3. Technetium 99-m MAA was injected at the level of the right hepatic artery for additional nuclear medicine imaging and lung shunt fraction calculation. Electronically Signed   By: Aletta Edouard M.D.   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 EXAM:  1. ULTRASOUND GUIDANCE FOR VASCULAR ACCESS OF THE RIGHT COMMON  FEMORAL ARTERY 2. VISCERAL SELECTIVE ARTERIOGRAPHY OF THE CELIAC AXIS 3. SELECTIVE ARTERIOGRAPHY AT THE LEVEL OF THE COMMON HEPATIC ARTERY 4. SELECTIVE ARTERIOGRAPHY AT THE LEVEL OF THE RIGHT HEPATIC ARTERY 5. TRANSCATHETER YTTRIUM-90 RADIOEMBOLIZATION OF THE RIGHT HEPATIC ARTERY FLUOROSCOPY: 12 minutes and 36 seconds.  174 mGy. MEDICATIONS AND MEDICAL HISTORY: Additional Medications: 500 mg IV Flagyl, 500 mg IV Levaquin, 8 mg IV Decadron, 40 mg IV Protonix, 8 mg IV Zofran Y-90 dose: 27 mCi ANESTHESIA/SEDATION: Moderate (conscious) sedation was employed during this procedure. A total of Versed 2.0 mg and Fentanyl 100 mcg was administered intravenously by radiology nursing. Moderate Sedation Time: 51 minutes. The patient's level of consciousness and vital signs were monitored continuously by radiology nursing throughout the procedure under my direct supervision. CONTRAST:  35 mL Omnipaque-300 PROCEDURE: The procedure, risks, benefits, and alternatives were explained to the patient. Questions regarding the procedure were encouraged and answered. The patient understands and consents to the procedure. A time-out was performed prior to initiating the procedure. The left groin was prepped with chlorhexidine in a sterile fashion, and a sterile drape was applied covering the operative field. A sterile gown and sterile gloves were used for the procedure. Local anesthesia was provided with 1% Lidocaine. Ultrasound image documentation was performed. Ultrasound was performed of both right and left common femoral arteries. The left was chosen for access. A permanent ultrasound image was recorded to document patency and level of access. Access of the left common femoral artery was performed under ultrasound guidance with a micropuncture set. A 5-French sheath was placed. A 5-French Cobra catheter was advanced and used to selectively catheterize the celiac axis. Selective arteriography of the celiac axis was performed. The 5  French catheter was further advanced over a hydrophilic guidewire into the common hepatic artery and selective arteriography performed at the level of the common hepatic artery. A microcatheter was used to selectively catheterize the right hepatic artery. Selective right hepatic arteriography was performed. Radioembolization was performed with Yttrium-90 SIR Spheres. Particles were administered via a microcatheter utilizing a completely enclosed system. Monitoring of antegrade flow was performed during administration under fluoroscopy with use of contrast intermittently. After administration of the first dose of particles, the microcatheter was removed and discarded along with the attached tubing and particle vial. Arteriotomy closure was performed with the Cordis ExoSeal device. FINDINGS: Access for treatment was performed at the level of the left common femoral artery due to small residual hematoma overlying the right common femoral artery as well as ecchymosis from the prior arteriogram in the right groin region. Arteriography was necessary prior to radioembolization to confirm patency of the celiac axis, branch vessels and right hepatic artery as well as plan exact level of treatment. There was no significant change in flow pattern compared to the prior mapping study performed on 07/28/2021. There is some antegrade flow in the gastroduodenal artery with common hepatic arteriography. Previous arteriography demonstrated predominantly retrograde flow in the GDA trunk. GDA flow may need to be reassessed and occluded prior to potential future left hepatic artery radioembolization. The entire dose of yttrium 90 microspheres was able to be delivered in the right hepatic artery while maintaining antegrade flow. COMPLICATIONS: None IMPRESSION: Hepatic arterial radioembolization performed with Yttrium-90 microspheres. Right hepatic artery treatment was performed today with administration of 27 millicuries of Z-61  activity. Electronically Signed   By: Aletta Edouard M.D.   On: 08/04/2021 12:10   IR US Guide  Vasc Access Right  Result Date: 07/28/2021 INDICATION: Recurrent metastatic gallbladder carcinoma to the liver. Mapping hepatic arteriography with possible embolization and lung shunt fraction calculation is performed prior to scheduled yttrium 90 radioembolization. EXAM: 1. ULTRASOUND GUIDANCE FOR VASCULAR ACCESS OF THE RIGHT COMMON FEMORAL ARTERY 2. SELECTIVE VISCERAL ARTERIOGRAPHY OF THE CELIAC AXIS 3. SELECTIVE ARTERIOGRAPHY OF THE COMMON HEPATIC ARTERY 4. SELECTIVE ARTERIOGRAPHY OF THE GASTRODUODENAL ARTERY 5. SELECTIVE ARTERIOGRAPHY OF THE PROPER HEPATIC ARTERY 6. SELECTIVE ARTERIOGRAPHY OF THE RIGHT HEPATIC ARTERY 7. THREE-DIMENSIONAL RECONSTRUCTION OF ROTATIONAL ARTERIOGRAPHY ON SEPARATE WORKSTATION MEDICATIONS: None ANESTHESIA/SEDATION: Moderate (conscious) sedation was employed during this procedure. A total of Versed 2.0 mg and Fentanyl 150 mcg was administered intravenously by radiology nursing. Moderate Sedation Time: 104 minutes. The patient's level of consciousness and vital signs were monitored continuously by radiology nursing throughout the procedure under my direct supervision. CONTRAST:  60 mL Omnipaque 300 FLUOROSCOPY: Radiation Exposure Index (as provided by the fluoroscopic device): 474 mGy Kerma COMPLICATIONS: None immediate. PROCEDURE: Informed consent was obtained from the patient following explanation of the procedure, risks, benefits and alternatives. The patient understands, agrees and consents for the procedure. All questions were addressed. A time out was performed prior to the initiation of the procedure. Maximal barrier sterile technique utilized including caps, mask, sterile gowns, sterile gloves, large sterile drape, hand hygiene, and chlorhexidine prep. Ultrasound was utilized to confirm patency of the right common femoral artery. A permanent ultrasound image was recorded and saved.  The artery was accessed with a micropuncture set. A 5 French sheath was placed over a guidewire. A 5 French Cobra catheter was introduced over a wire into the abdominal aorta. The celiac axis was catheterized with the Cobra catheter and selective arteriography was performed at the level of the celiac axis. Attempt was made to advance the catheter over various guidewires into the celiac trunk and common hepatic artery. Ultimately, a 5 French angled glide catheter was able to be selectively advanced over a hydrophilic wire into the proper hepatic artery. Rotational selective arteriography was performed at the level of the proper hepatic artery. Rotational arteriography data was sent to a separate workstation and 3 dimensional reconstructions performed for vascular mapping during the procedure, perfusion analysis to different segments of the liver and volumetric analysis of perfused areas utilizing fusion techniques. The 5 French catheter was then retracted to the level of the common hepatic artery and selective arteriography performed. A microcatheter was advanced through the 5 French catheter and used to selectively catheterize the gastroduodenal artery. Selective arteriography was performed of the gastroduodenal artery via the microcatheter. A single 3 mm diameter, 15 cm length Ruby LP coil was advanced through the microcatheter. This was partially advanced out of the tip of the microcatheter under fluoroscopy. Microcatheter position was adjusted and additional advancement of the coil performed. Ultimately, the coil was retracted back into its deployment catheter and completely removed. The microcatheter was then used to selectively catheterize the right hepatic artery. Selective arteriography was performed. A dose of 4.3 millicuries of technetium 77mMAA was then injected via the microcatheter into the right hepatic artery. The microcatheter and 5 French catheter were then removed. Oblique arteriography was  performed at the level of the femoral access site. The Cordis ExoSeal device was advanced via the femoral sheath. The device was removed, the sheath removed and hemostasis obtained with manual compression. 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 Despite stenosis, the celiac axis was able to be selectively catheterized with a 5 French catheter and ultimately selective arteriography performed. This demonstrates a patent visualized celiac trunk and patent splenic, left gastric and common hepatic arteries. The 5 French catheter was able to be advanced initially to the level of the proper hepatic artery. Rotational arteriography with reconstruction demonstrates a rounded area of central hypo-vascularity predominantly in the right lobe of the liver but also just extending into the left lobe consistent with the dominant area of tumor by prior imaging near the gallbladder fossa. Based on three-dimensional models and fusion imaging, this dominant area of tumor is predominantly supplied by anterior and posterior division branches of the right hepatic artery with additional supply from an anterior medial segment branch of the left hepatic artery. Common hepatic arteriography demonstrated what appeared to be antegrade flow in the gastroduodenal artery. With selective catheterization and advancement of a microcatheter into the distal gastroduodenal artery, predominant flow pattern was antegrade at the level of the gastroepiploic artery. However, with partial deployment of an embolization coil, the coil clearly was traveling with flow in a retrograde direction along the shaft of the microcatheter and as the microcatheter was retracted, it became evident that the main  trunk of the gastroduodenal artery demonstrates retrograde flow via pancreaticoduodenal collaterals from the SMA and is likely the predominant flow pattern to celiac branches given evidence of a probable significant celiac origin stenosis. Embolization of the gastroduodenal artery was therefore not performed as inflow is necessary for maintaining antegrade flow in the hepatic arteries during radioembolization treatment. There may be a tiny right gastric artery emanating at the base of the proper hepatic artery just beyond the GDA origin. This vessel is too small to catheterize. Right hepatic arteriography demonstrates normal patency of right hepatic arterial branches. MAA was injected at the level of the right hepatic artery for purposes of lung shunt fraction calculation and additional nuclear medicine imaging. The Cordis ExoSeal device did not deploy properly and hemostasis was obtained with manual compression after sheath removal. IMPRESSION: 1. Mapping hepatic arteriography prior to planned radioembolization demonstrates a large rounded area of central vascular displacement in the liver that is relatively hypovascular and consistent with the area of majority of tumor in the liver near the gallbladder fossa. This is supplied mostly by branches of the right hepatic artery and also partially by an anterior medial segment branch of the left hepatic artery. 2. The gastroduodenal artery was not embolized prior to radioembolization due to evident retrograde flow in the main trunk of the GDA. The GDA is acting as the primary collateral branch providing flow to celiac branches due to a significant celiac origin stenosis. 3. Technetium 99-m MAA was injected at the level of the right hepatic artery for additional nuclear medicine imaging and lung shunt fraction calculation. Electronically Signed   By: Aletta Edouard M.D.   On: 07/28/2021 16:05   ECHOCARDIOGRAM COMPLETE  Result Date: 08/03/2021    ECHOCARDIOGRAM REPORT    Patient Name:   LUCCIANO VITALI Date of Exam: 08/02/2021 Medical Rec #:  [id number]     Height:       70.0 in Accession #:    [id number]    Weight:       149.1 lb Date of Birth:  03-17-1932     BSA:  1.842 m Patient Age:    52 years      BP:           108/58 mmHg Patient Gender: M             HR:           69 bpm. Exam Location:  Outpatient Procedure: 2D Echo, Cardiac Doppler and Color Doppler Indications:    Edema, unspecified type [R60.9]                  Shortness of breath [R06.02]  History:        Patient has prior history of Echocardiogram examinations, most                 recent 07/17/2018. Risk Factors:Hypertension. History of cancer.                 Bilateral leg swelling. Anemia. Thrombocytopenia.  Sonographer:    Darlina Sicilian RDCS Referring Phys: [id number] Barrie Folk  Sonographer Comments: Global longitudinal strain was attempted. IMPRESSIONS  1. Left ventricular ejection fraction, by estimation, is 60 to 65%. The left ventricle has normal function. The left ventricle has no regional wall motion abnormalities. There is mild left ventricular hypertrophy. Left ventricular diastolic parameters are indeterminate.  2. Right ventricular systolic function is normal. The right ventricular size is mildly enlarged. There is mildly elevated pulmonary artery systolic pressure. The estimated right ventricular systolic pressure is 75.4 mmHg.  3. The mitral valve is normal in structure. Trivial mitral valve regurgitation. No evidence of mitral stenosis.  4. The aortic valve is tricuspid. Aortic valve regurgitation is not visualized. Aortic valve sclerosis is present, with no evidence of aortic valve stenosis.  5. The inferior vena cava is normal in size with greater than 50% respiratory variability, suggesting right atrial pressure of 3 mmHg. FINDINGS  Left Ventricle: Left ventricular ejection fraction, by estimation, is 60 to 65%. The left ventricle has normal function. The left ventricle has no  regional wall motion abnormalities. The left ventricular internal cavity size was normal in size. There is  mild left ventricular hypertrophy. Left ventricular diastolic parameters are indeterminate. Right Ventricle: The right ventricular size is mildly enlarged. Right vetricular wall thickness was not well visualized. Right ventricular systolic function is normal. There is mildly elevated pulmonary artery systolic pressure. The tricuspid regurgitant  velocity is 3.03 m/s, and with an assumed right atrial pressure of 3 mmHg, the estimated right ventricular systolic pressure is 49.2 mmHg. Left Atrium: Left atrial size was normal in size. Right Atrium: Right atrial size was normal in size. Pericardium: There is no evidence of pericardial effusion. Mitral Valve: The mitral valve is normal in structure. Trivial mitral valve regurgitation. No evidence of mitral valve stenosis. Tricuspid Valve: The tricuspid valve is normal in structure. Tricuspid valve regurgitation is mild. Aortic Valve: The aortic valve is tricuspid. Aortic valve regurgitation is not visualized. Aortic valve sclerosis is present, with no evidence of aortic valve stenosis. Aortic valve mean gradient measures 6.0 mmHg. Aortic valve peak gradient measures 12.4 mmHg. Aortic valve area, by VTI measures 1.61 cm. Pulmonic Valve: The pulmonic valve was not well visualized. Pulmonic valve regurgitation is trivial. Aorta: The aortic root and ascending aorta are structurally normal, with no evidence of dilitation. Venous: The inferior vena cava is normal in size with greater than 50% respiratory variability, suggesting right atrial pressure of 3 mmHg. IAS/Shunts: The interatrial septum was not well visualized.  LEFT VENTRICLE PLAX 2D LVIDd:  4.70 cm   Diastology LVIDs:         2.60 cm   LV e' medial:    5.10 cm/s LV PW:         0.90 cm   LV E/e' medial:  10.9 LV IVS:        1.10 cm   LV e' lateral:   5.63 cm/s LVOT diam:     1.90 cm   LV E/e' lateral: 9.9  LV SV:         57 LV SV Index:   31 LVOT Area:     2.84 cm  RIGHT VENTRICLE RV S prime:     17.40 cm/s TAPSE (M-mode): 2.5 cm LEFT ATRIUM             Index        RIGHT ATRIUM           Index LA diam:        2.40 cm 1.30 cm/m   RA Area:     13.10 cm LA Vol (A2C):   20.3 ml 11.02 ml/m  RA Volume:   31.00 ml  16.83 ml/m LA Vol (A4C):   18.9 ml 10.26 ml/m LA Biplane Vol: 20.6 ml 11.18 ml/m  AORTIC VALVE                     PULMONIC VALVE AV Area (Vmax):    2.06 cm      PV Vmax:       1.48 m/s AV Area (Vmean):   1.68 cm      PV Peak grad:  8.8 mmHg AV Area (VTI):     1.61 cm AV Vmax:           176.00 cm/s AV Vmean:          115.000 cm/s AV VTI:            0.352 m AV Peak Grad:      12.4 mmHg AV Mean Grad:      6.0 mmHg LVOT Vmax:         128.00 cm/s LVOT Vmean:        68.100 cm/s LVOT VTI:          0.200 m LVOT/AV VTI ratio: 0.57  AORTA Ao Root diam: 3.80 cm Ao Asc diam:  3.00 cm MITRAL VALVE               TRICUSPID VALVE MV Area (PHT): 2.21 cm    TR Peak grad:   36.7 mmHg MV Decel Time: 343 msec    TR Vmax:        303.00 cm/s MV E velocity: 55.70 cm/s MV A velocity: 58.50 cm/s  SHUNTS MV E/A ratio:  0.95        Systemic VTI:  0.20 m                            Systemic Diam: 1.90 cm Oswaldo Milian MD Electronically signed by Oswaldo Milian MD Signature Date/Time: 08/03/2021/12:19:08 AM    Final    IR EMBO TUMOR ORGAN ISCHEMIA INFARCT INC GUIDE ROADMAPPING  Result Date: 08/04/2021 CLINICAL DATA:  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 EXAM: 1. ULTRASOUND GUIDANCE FOR VASCULAR ACCESS OF THE RIGHT COMMON FEMORAL ARTERY 2. VISCERAL SELECTIVE ARTERIOGRAPHY OF THE CELIAC AXIS 3. SELECTIVE ARTERIOGRAPHY AT THE LEVEL OF THE COMMON  HEPATIC ARTERY 4. SELECTIVE ARTERIOGRAPHY AT THE LEVEL OF THE RIGHT HEPATIC ARTERY 5. TRANSCATHETER YTTRIUM-90 RADIOEMBOLIZATION OF THE RIGHT HEPATIC ARTERY FLUOROSCOPY: 12 minutes and 36  seconds.  174 mGy. MEDICATIONS AND MEDICAL HISTORY: Additional Medications: 500 mg IV Flagyl, 500 mg IV Levaquin, 8 mg IV Decadron, 40 mg IV Protonix, 8 mg IV Zofran Y-90 dose: 27 mCi ANESTHESIA/SEDATION: Moderate (conscious) sedation was employed during this procedure. A total of Versed 2.0 mg and Fentanyl 100 mcg was administered intravenously by radiology nursing. Moderate Sedation Time: 51 minutes. The patient's level of consciousness and vital signs were monitored continuously by radiology nursing throughout the procedure under my direct supervision. CONTRAST:  35 mL Omnipaque-300 PROCEDURE: The procedure, risks, benefits, and alternatives were explained to the patient. Questions regarding the procedure were encouraged and answered. The patient understands and consents to the procedure. A time-out was performed prior to initiating the procedure. The left groin was prepped with chlorhexidine in a sterile fashion, and a sterile drape was applied covering the operative field. A sterile gown and sterile gloves were used for the procedure. Local anesthesia was provided with 1% Lidocaine. Ultrasound image documentation was performed. Ultrasound was performed of both right and left common femoral arteries. The left was chosen for access. A permanent ultrasound image was recorded to document patency and level of access. Access of the left common femoral artery was performed under ultrasound guidance with a micropuncture set. A 5-French sheath was placed. A 5-French Cobra catheter was advanced and used to selectively catheterize the celiac axis. Selective arteriography of the celiac axis was performed. The 5 French catheter was further advanced over a hydrophilic guidewire into the common hepatic artery and selective arteriography performed at the level of the common hepatic artery. A microcatheter was used to selectively catheterize the right hepatic artery. Selective right hepatic arteriography was performed.  Radioembolization was performed with Yttrium-90 SIR Spheres. Particles were administered via a microcatheter utilizing a completely enclosed system. Monitoring of antegrade flow was performed during administration under fluoroscopy with use of contrast intermittently. After administration of the first dose of particles, the microcatheter was removed and discarded along with the attached tubing and particle vial. Arteriotomy closure was performed with the Cordis ExoSeal device. FINDINGS: Access for treatment was performed at the level of the left common femoral artery due to small residual hematoma overlying the right common femoral artery as well as ecchymosis from the prior arteriogram in the right groin region. Arteriography was necessary prior to radioembolization to confirm patency of the celiac axis, branch vessels and right hepatic artery as well as plan exact level of treatment. There was no significant change in flow pattern compared to the prior mapping study performed on 07/28/2021. There is some antegrade flow in the gastroduodenal artery with common hepatic arteriography. Previous arteriography demonstrated predominantly retrograde flow in the GDA trunk. GDA flow may need to be reassessed and occluded prior to potential future left hepatic artery radioembolization. The entire dose of yttrium 90 microspheres was able to be delivered in the right hepatic artery while maintaining antegrade flow. COMPLICATIONS: None IMPRESSION: Hepatic arterial radioembolization performed with Yttrium-90 microspheres. Right hepatic artery treatment was performed today with administration of 27 millicuries of C-37 activity. Electronically Signed   By: Aletta Edouard M.D.   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 Mapping hepatic arteriography with possible embolization and lung shunt fraction  calculation is  performed prior to scheduled yttrium 90 radioembolization. EXAM: 1. ULTRASOUND GUIDANCE FOR VASCULAR ACCESS OF THE RIGHT COMMON FEMORAL ARTERY 2. SELECTIVE VISCERAL ARTERIOGRAPHY OF THE CELIAC AXIS 3. SELECTIVE ARTERIOGRAPHY OF THE COMMON HEPATIC ARTERY 4. SELECTIVE ARTERIOGRAPHY OF THE GASTRODUODENAL ARTERY 5. SELECTIVE ARTERIOGRAPHY OF THE PROPER HEPATIC ARTERY 6. SELECTIVE ARTERIOGRAPHY OF THE RIGHT HEPATIC ARTERY 7. THREE-DIMENSIONAL RECONSTRUCTION OF ROTATIONAL ARTERIOGRAPHY ON SEPARATE WORKSTATION MEDICATIONS: None ANESTHESIA/SEDATION: Moderate (conscious) sedation was employed during this procedure. A total of Versed 2.0 mg and Fentanyl 150 mcg was administered intravenously by radiology nursing. Moderate Sedation Time: 104 minutes. The patient's level of consciousness and vital signs were monitored continuously by radiology nursing throughout the procedure under my direct supervision. CONTRAST:  60 mL Omnipaque 300 FLUOROSCOPY: Radiation Exposure Index (as provided by the fluoroscopic device): 867 mGy Kerma COMPLICATIONS: None immediate. PROCEDURE: Informed consent was obtained from the patient following explanation of the procedure, risks, benefits and alternatives. The patient understands, agrees and consents for the procedure. All questions were addressed. A time out was performed prior to the initiation of the procedure. Maximal barrier sterile technique utilized including caps, mask, sterile gowns, sterile gloves, large sterile drape, hand hygiene, and chlorhexidine prep. Ultrasound was utilized to confirm patency of the right common femoral artery. A permanent ultrasound image was recorded and saved. The artery was accessed with a micropuncture set. A 5 French sheath was placed over a guidewire. A 5 French Cobra catheter was introduced over a wire into the abdominal aorta. The celiac axis was catheterized with the Cobra catheter and selective arteriography was performed at the level of  the celiac axis. Attempt was made to advance the catheter over various guidewires into the celiac trunk and common hepatic artery. Ultimately, a 5 French angled glide catheter was able to be selectively advanced over a hydrophilic wire into the proper hepatic artery. Rotational selective arteriography was performed at the level of the proper hepatic artery. Rotational arteriography data was sent to a separate workstation and 3 dimensional reconstructions performed for vascular mapping during the procedure, perfusion analysis to different segments of the liver and volumetric analysis of perfused areas utilizing fusion techniques. The 5 French catheter was then retracted to the level of the common hepatic artery and selective arteriography performed. A microcatheter was advanced through the 5 French catheter and used to selectively catheterize the gastroduodenal artery. Selective arteriography was performed of the gastroduodenal artery via the microcatheter. A single 3 mm diameter, 15 cm length Ruby LP coil was advanced through the microcatheter. This was partially advanced out of the tip of the microcatheter under fluoroscopy. Microcatheter position was adjusted and additional advancement of the coil performed. Ultimately, the coil was retracted back into its deployment catheter and completely removed. The microcatheter was then used to selectively catheterize the right hepatic artery. Selective arteriography was performed. A dose of 4.3 millicuries of technetium 13mMAA was then injected via the microcatheter into the right hepatic artery. The microcatheter and 5 French catheter were then removed. Oblique arteriography was performed at the level of the femoral access site. The Cordis ExoSeal device was advanced via the femoral sheath. The device was removed, the sheath removed and hemostasis obtained with manual compression. FINDINGS: With attempted catheterization of the celiac axis it became apparent that there  was a probable stenosis at the origin of the celiac axis. 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 Despite stenosis, the  celiac axis was able to be selectively catheterized with a 5 French catheter and ultimately selective arteriography performed. This demonstrates a patent visualized celiac trunk and patent splenic, left gastric and common hepatic arteries. The 5 French catheter was able to be advanced initially to the level of the proper hepatic artery. Rotational arteriography with reconstruction demonstrates a rounded area of central hypo-vascularity predominantly in the right lobe of the liver but also just extending into the left lobe consistent with the dominant area of tumor by prior imaging near the gallbladder fossa. Based on three-dimensional models and fusion imaging, this dominant area of tumor is predominantly supplied by anterior and posterior division branches of the right hepatic artery with additional supply from an anterior medial segment branch of the left hepatic artery. Common hepatic arteriography demonstrated what appeared to be antegrade flow in the gastroduodenal artery. With selective catheterization and advancement of a microcatheter into the distal gastroduodenal artery, predominant flow pattern was antegrade at the level of the gastroepiploic artery. 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 Embolization of the gastroduodenal artery was therefore  not performed as inflow is necessary for maintaining antegrade flow in the hepatic arteries during radioembolization treatment. There may be a tiny right gastric artery emanating at the base of the proper hepatic artery just beyond the GDA origin. This vessel is too small to catheterize. Right hepatic arteriography demonstrates normal patency of right hepatic arterial branches. MAA was injected at the level of the right hepatic artery for purposes of lung shunt fraction calculation and additional nuclear medicine imaging. The Cordis ExoSeal device did not deploy properly and hemostasis was obtained with manual compression after sheath removal. IMPRESSION: 1. Mapping hepatic arteriography prior to planned radioembolization demonstrates a large rounded area of central vascular displacement in the liver that is relatively hypovascular and consistent with the area of majority of tumor in the liver near the gallbladder fossa. This is supplied mostly by branches of the right hepatic artery and also partially by an anterior medial segment branch of the left hepatic artery. 2. The gastroduodenal artery was not embolized prior to radioembolization due to evident retrograde flow in the main trunk of the GDA. The GDA is acting as the primary collateral branch providing flow to celiac branches due to a significant celiac origin stenosis. 3. Technetium 99-m MAA was injected at the level of the right hepatic artery for additional nuclear medicine imaging and lung shunt fraction calculation. Electronically Signed   By: Aletta Edouard M.D.   On: 07/28/2021 16:05   NM Radio Pharm Therapy Intraarterial  Result Date: 08/05/2021 CLINICAL DATA:  Gallbladder carcinoma with liver metastasis. Microsphere radio therapy to the RIGHT hepatic lobe. EXAM: NUCLEAR MEDICINE SPECIAL MED RAD PHYSICS CONS; NUCLEAR MEDICINE RADIO PHARM THERAPY INTRA ARTERIAL; NUCLEAR MEDICINE TREATMENT PROCEDURE; NUCLEAR MEDICINE LIVER SCAN TECHNIQUE: In  conjunction with the interventional radiologist a Y- Microsphere dose was calculated utilizing body surface area formulation as well as partition formulation. Post processing volume metrics performed. Calculated dose equal 28.39 mCi. Pre therapy MAA liver SPECT scan and CTA were evaluated. Utilizing a microcatheter system, the hepatic artery was selected and Y-90  microspheres were delivered in fractionated aliquots. Radiopharmaceutical was delivered by the interventional radiologist and nuclear radiologist. The patient tolerated procedure well. No adverse effects were noted. Bremsstrahlung planar and SPECT imaging of the abdomen following intrahepatic arterial delivery of Y-90 microsphere was performed. RADIOPHARMACEUTICALS:  82.5 millicuries yttrium 90 microspheres COMPARISON:  MAA scan 07/28/2021, MRI 05/25/2021 FINDINGS: Y - 90 microspheres therapy as above. First therapy the right hepatic lobe. Bremsstrahlung planar and SPECT imaging of the abdomen following intrahepatic arterial delivery of Y-35mcrosphere demonstrates radioactivity localized to the RIGHT hepatic lobe. No evidence of extrahepatic activity. IMPRESSION: Successful Y - 90 microsphere delivery for treatment of unresectable liver metastasis. First therapy to the RIGHT lobe. Bremssstrahlung scan demonstrates activity localized to RIGHT hepatic lobe with no extrahepatic activity identified. Electronically Signed   By: SSuzy BouchardM.D.   On: 08/05/2021 14:08    ASSESSMENT & PLAN:   88y.o. male with  1. H/o Diffuse Large B-Cell Lymphoma, Stage IV- now in remission 07/08/18 Right cervical soft tissue biopsy revealed Diffuse Large B-Cell Lymphoma, germinal center type  06/05/18 CT Neck revealed Pathologic RIGHT-sided level II, III, IV, and V lymphadenopathy, most consistent with metastatic carcinoma. An obvious primary source is not identified. Tissue sampling is warranted. 07/16/18 Hep B and Hep C negative 07/17/18 ECHO revealed LV EF of  60-65% 07/22/18 PET/CT revealed "Hypermetabolic adenopathy especially concentrated in the right neck but also in the left neck, chest, abdomen, and pelvis. The adenopathy is primarily Deauville 5 although some few scattered smaller lesions are Deauville 4. 2. Diffuse abnormal splenic activity, Deauville 5, without overt splenomegaly. 3. There is also hypermetabolic Deauville 5 activity in the mildly thickened distal appendix, and raising suspicion for involvement of the appendix. 4. Other imaging findings of potential clinical significance: Aortic Atherosclerosis. Coronary atherosclerosis." 10/03/2018 PET scan revealed "Interval response to therapy with stable to decreased size of lymph nodes on CT and generalized decrease in hypermetabolism of the abnormal nodes. Hypermetabolism in the lymph nodes today is compatible with a combination of Deauville 3 and Deauville 4 disease. Stable tiny bilateral pulmonary nodules. Increase radiotracer accumulation in the marrow space on today's study, presumably representing marrow stimulatory effects of therapy."  2. Currently with stage IV mantle cell lymphoma 02/23/2020 Right Cervical Lymph Node Surgical Pathology [phone] which revealed "Non-Hodgkin B-cell lymphoma. Ki-67 generally shows low expression (<5-15%)." 02/23/2020 FISH Mutation Analysis which revealed "t(11;14): DETECTED". PLAN -No significant prohibitive toxicities or significant bruising at this time. Grade 1 fatigue from acalabrutinib. Blood pressure stable Continue acalabrutinib 100 mg p.o. twice daily. Patient has significantly elevated LDH but this could be related to his transaminitis from liver injury related to his gallbladder cancer metastasis to the liver as well as Y90 treatment to the liver. We will get repeat PET CT scan in 6 weeks  3.  Normocytic normochromic anemia.  Likely multifactorial mantle cell lymphoma and metastatic gallbladder cancer plus a acalabrutinib Plan -We will  continue to monitor with treatment. No overt bleeding noted.  4.  Thrombocytopenia likely due to mantle cell lymphoma and acalabrutinib. Platelet counts at 127k today. Continue to monitor  5.  Bilateral lower extremity edema echo showed normal ejection fraction Likely related to third spacing due to hypoalbuminemia.  Cannot rule out lymphedema due to lymphoma involving pelvic lymph nodes. Continue to optimize p.o. intake Started on low-dose Lasix 20 mg p.o. daily  5.  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  Metastatic to the liver with multiple liver lesions. -Pathology from his  recent gallbladder surgery showed GALLBLADDER, CHOLECYSTECTOMY:  - Adenocarcinoma, 2.4 cm maximally, with perforation of serosa. Negative for carcinoma in one lymph node. Cholelithiasis.  CA 19-9 tumor marker continues to gradually increase which is concerning. Mild intermittent right upper quadrant discomfort  PLAN -Labs done today 08/10/2021 were reviewed in detail. CBC shows slightly elevated WBC count at 13.3k, slight anemia, decreased Hgb at 10.0, and mild thrombocytopenia with platelets of 127k.  CMP slight calcemia with calcium at 8.2, albumin slightly decreased at 3.0 g/dL, ALP elevated at 373 U/L. LDH at . Lab Results  Component Value Date   LDH 454 (H) 08/10/2021    CA 19-9 1.8k monitor after completion of Y90 treatment -Continue to follow with Dr. Kathlene Cote for management for neck scheduled treatment on 09/01/2021 -We will monitor tumor markers -We will recommend holding statin in the context of slightly abnormal liver function tests and significant liver metastases to avoid hepatotoxic medications as much as possible. -Refill Tramadol. -Take Senna S as needed. -Start Gabapentin 230m po HS for neuropathic pain. -PET/CT scan in 6 weeks -Follow up with labs in 8 weeks.  6.  Weight loss/supportive treatment -Prescribed tramadol as needed for cancer related  pain from liver metastases -As needed Zofran for nausea. -Offered referral to nutritional therapy which patient wants to hold off at this time.  He has been using Ensure and will optimize this.  FOLLOW UP: Pet/ct in 6 weeks RTC with Dr KIrene Limbowith labs in 8 weeks  The total time spent in the appointment was 40 minutes*.  All of the patient's questions were answered with apparent satisfaction. The patient knows to call the clinic with any problems, questions or concerns.   GSullivan LoneMD MS AAHIVMS SSan[facility]CBoston [facility] HospitalHematology/Oncology Physician [facility] .*Total Encounter Time as defined by the Centers for Medicare and Medicaid Services includes, in addition to the face-to-face time of a patient visit (documented in the note above) non-face-to-face time: obtaining and reviewing outside history, ordering and reviewing medications, tests or procedures, care coordination (communications with other health care professionals or caregivers) and documentation in the medical record.  I, GMelene Muller am acting as scribe for Dr. GSullivan Lone MD.  .I have reviewed the above documentation for accuracy and completeness, and I agree with the above. .Brunetta GeneraMD

## 2021-08-11 LAB — CANCER ANTIGEN 19-9: CA 19-9: 1839 U/mL — ABNORMAL HIGH (ref 0–35)

## 2021-08-15 ENCOUNTER — Telehealth: Payer: Self-pay | Admitting: Hematology

## 2021-08-15 NOTE — Telephone Encounter (Signed)
Scheduled follow-up appointment per 5/17 los. Patient is aware.

## 2021-08-16 ENCOUNTER — Other Ambulatory Visit (HOSPITAL_COMMUNITY): Payer: Self-pay | Admitting: Interventional Radiology

## 2021-08-16 ENCOUNTER — Telehealth: Payer: Self-pay

## 2021-08-16 ENCOUNTER — Encounter: Payer: Self-pay | Admitting: Hematology

## 2021-08-16 NOTE — Telephone Encounter (Signed)
Returned call to pt. Pt wanted to know if stopping his Losartan/HCTZ due to low blood pressure was causing his bilateral lower leg edema. Pt currently taking Lasix now daily but results with leg edema are minimal. Pt states the edema decrease at night but returns daily mid morning. Informed pt I would discuss with Dr Irene Limbo and let him know. Pt verbalized understanding.

## 2021-08-25 ENCOUNTER — Other Ambulatory Visit (HOSPITAL_COMMUNITY): Payer: Medicare Other

## 2021-08-29 ENCOUNTER — Telehealth: Payer: Self-pay

## 2021-08-29 NOTE — Telephone Encounter (Signed)
Returned call to pt. Pt stated he is having increased edema in his bilateral lower legs. The edema has now traveled up to his wait and he has marked scrotal edema. Pt also states his legs are weak. Per Dr Irene Limbo pt to get PET scan done and then we can discuss next steps. Pt acknowledged and verbalized understanding. Pt to call central scheduling in am and set up PET scan appointment.

## 2021-08-31 ENCOUNTER — Other Ambulatory Visit: Payer: Self-pay | Admitting: Radiology

## 2021-08-31 ENCOUNTER — Other Ambulatory Visit: Payer: Self-pay | Admitting: Internal Medicine

## 2021-08-31 DIAGNOSIS — C220 Liver cell carcinoma: Secondary | ICD-10-CM

## 2021-09-01 ENCOUNTER — Ambulatory Visit (HOSPITAL_COMMUNITY)
Admission: RE | Admit: 2021-09-01 | Discharge: 2021-09-01 | Disposition: A | Discharge status: Home / Self Care | Payer: Medicare Other | Source: Ambulatory Visit | Attending: Interventional Radiology | Admitting: Interventional Radiology

## 2021-09-01 ENCOUNTER — Inpatient Hospital Stay (HOSPITAL_COMMUNITY): Payer: Medicare Other

## 2021-09-01 ENCOUNTER — Inpatient Hospital Stay (HOSPITAL_COMMUNITY)
Admission: EM | Admit: 2021-09-01 | Discharge: 2021-09-08 | DRG: 180 | Disposition: A | Discharge status: Hospice | Payer: Medicare Other | Attending: Internal Medicine | Admitting: Internal Medicine

## 2021-09-01 ENCOUNTER — Encounter (HOSPITAL_COMMUNITY)
Admission: RE | Admit: 2021-09-01 | Discharge: 2021-09-01 | Disposition: A | Discharge status: Home / Self Care | Payer: Medicare Other | Source: Ambulatory Visit | Attending: Interventional Radiology | Admitting: Interventional Radiology

## 2021-09-01 ENCOUNTER — Encounter (HOSPITAL_COMMUNITY): Payer: Self-pay

## 2021-09-01 ENCOUNTER — Emergency Department (HOSPITAL_COMMUNITY): Payer: Medicare Other

## 2021-09-01 ENCOUNTER — Other Ambulatory Visit: Payer: Self-pay

## 2021-09-01 ENCOUNTER — Encounter (HOSPITAL_COMMUNITY): Payer: Medicare Other

## 2021-09-01 DIAGNOSIS — Z888 Allergy status to other drugs, medicaments and biological substances status: Secondary | ICD-10-CM

## 2021-09-01 DIAGNOSIS — R0902 Hypoxemia: Secondary | ICD-10-CM | POA: Diagnosis not present

## 2021-09-01 DIAGNOSIS — R4182 Altered mental status, unspecified: Secondary | ICD-10-CM | POA: Diagnosis not present

## 2021-09-01 DIAGNOSIS — Z539 Procedure and treatment not carried out, unspecified reason: Secondary | ICD-10-CM | POA: Insufficient documentation

## 2021-09-01 DIAGNOSIS — C8318 Mantle cell lymphoma, lymph nodes of multiple sites: Secondary | ICD-10-CM | POA: Diagnosis not present

## 2021-09-01 DIAGNOSIS — E8809 Other disorders of plasma-protein metabolism, not elsewhere classified: Secondary | ICD-10-CM | POA: Diagnosis present

## 2021-09-01 DIAGNOSIS — J811 Chronic pulmonary edema: Secondary | ICD-10-CM | POA: Diagnosis present

## 2021-09-01 DIAGNOSIS — D649 Anemia, unspecified: Secondary | ICD-10-CM | POA: Diagnosis not present

## 2021-09-01 DIAGNOSIS — M7989 Other specified soft tissue disorders: Secondary | ICD-10-CM | POA: Diagnosis not present

## 2021-09-01 DIAGNOSIS — J9811 Atelectasis: Secondary | ICD-10-CM | POA: Diagnosis not present

## 2021-09-01 DIAGNOSIS — I2510 Atherosclerotic heart disease of native coronary artery without angina pectoris: Secondary | ICD-10-CM | POA: Diagnosis present

## 2021-09-01 DIAGNOSIS — G8929 Other chronic pain: Secondary | ICD-10-CM | POA: Diagnosis present

## 2021-09-01 DIAGNOSIS — N19 Unspecified kidney failure: Secondary | ICD-10-CM

## 2021-09-01 DIAGNOSIS — Z9842 Cataract extraction status, left eye: Secondary | ICD-10-CM

## 2021-09-01 DIAGNOSIS — E43 Unspecified severe protein-calorie malnutrition: Secondary | ICD-10-CM | POA: Diagnosis not present

## 2021-09-01 DIAGNOSIS — K7689 Other specified diseases of liver: Secondary | ICD-10-CM | POA: Diagnosis not present

## 2021-09-01 DIAGNOSIS — R601 Generalized edema: Principal | ICD-10-CM | POA: Diagnosis present

## 2021-09-01 DIAGNOSIS — C799 Secondary malignant neoplasm of unspecified site: Secondary | ICD-10-CM | POA: Insufficient documentation

## 2021-09-01 DIAGNOSIS — Z7401 Bed confinement status: Secondary | ICD-10-CM | POA: Diagnosis not present

## 2021-09-01 DIAGNOSIS — D696 Thrombocytopenia, unspecified: Secondary | ICD-10-CM

## 2021-09-01 DIAGNOSIS — I129 Hypertensive chronic kidney disease with stage 1 through stage 4 chronic kidney disease, or unspecified chronic kidney disease: Secondary | ICD-10-CM | POA: Diagnosis present

## 2021-09-01 DIAGNOSIS — K644 Residual hemorrhoidal skin tags: Secondary | ICD-10-CM | POA: Diagnosis present

## 2021-09-01 DIAGNOSIS — Z7189 Other specified counseling: Secondary | ICD-10-CM | POA: Diagnosis not present

## 2021-09-01 DIAGNOSIS — D539 Nutritional anemia, unspecified: Secondary | ICD-10-CM

## 2021-09-01 DIAGNOSIS — H409 Unspecified glaucoma: Secondary | ICD-10-CM | POA: Diagnosis not present

## 2021-09-01 DIAGNOSIS — Z515 Encounter for palliative care: Secondary | ICD-10-CM | POA: Diagnosis not present

## 2021-09-01 DIAGNOSIS — C7800 Secondary malignant neoplasm of unspecified lung: Principal | ICD-10-CM | POA: Diagnosis present

## 2021-09-01 DIAGNOSIS — R609 Edema, unspecified: Secondary | ICD-10-CM

## 2021-09-01 DIAGNOSIS — Z9049 Acquired absence of other specified parts of digestive tract: Secondary | ICD-10-CM | POA: Diagnosis not present

## 2021-09-01 DIAGNOSIS — C249 Malignant neoplasm of biliary tract, unspecified: Secondary | ICD-10-CM | POA: Insufficient documentation

## 2021-09-01 DIAGNOSIS — N1832 Chronic kidney disease, stage 3b: Secondary | ICD-10-CM | POA: Diagnosis not present

## 2021-09-01 DIAGNOSIS — E785 Hyperlipidemia, unspecified: Secondary | ICD-10-CM | POA: Diagnosis not present

## 2021-09-01 DIAGNOSIS — R600 Localized edema: Secondary | ICD-10-CM | POA: Diagnosis not present

## 2021-09-01 DIAGNOSIS — I10 Essential (primary) hypertension: Secondary | ICD-10-CM | POA: Diagnosis present

## 2021-09-01 DIAGNOSIS — Z79899 Other long term (current) drug therapy: Secondary | ICD-10-CM

## 2021-09-01 DIAGNOSIS — C220 Liver cell carcinoma: Secondary | ICD-10-CM | POA: Diagnosis not present

## 2021-09-01 DIAGNOSIS — J90 Pleural effusion, not elsewhere classified: Secondary | ICD-10-CM | POA: Diagnosis not present

## 2021-09-01 DIAGNOSIS — Z87891 Personal history of nicotine dependence: Secondary | ICD-10-CM

## 2021-09-01 DIAGNOSIS — Z8509 Personal history of malignant neoplasm of other digestive organs: Secondary | ICD-10-CM

## 2021-09-01 DIAGNOSIS — K449 Diaphragmatic hernia without obstruction or gangrene: Secondary | ICD-10-CM | POA: Diagnosis not present

## 2021-09-01 DIAGNOSIS — D6959 Other secondary thrombocytopenia: Secondary | ICD-10-CM | POA: Diagnosis present

## 2021-09-01 DIAGNOSIS — N179 Acute kidney failure, unspecified: Secondary | ICD-10-CM | POA: Diagnosis present

## 2021-09-01 DIAGNOSIS — J984 Other disorders of lung: Secondary | ICD-10-CM | POA: Diagnosis not present

## 2021-09-01 DIAGNOSIS — K625 Hemorrhage of anus and rectum: Secondary | ICD-10-CM | POA: Diagnosis present

## 2021-09-01 DIAGNOSIS — C787 Secondary malignant neoplasm of liver and intrahepatic bile duct: Secondary | ICD-10-CM | POA: Diagnosis present

## 2021-09-01 DIAGNOSIS — Z9841 Cataract extraction status, right eye: Secondary | ICD-10-CM

## 2021-09-01 DIAGNOSIS — C8331 Diffuse large B-cell lymphoma, lymph nodes of head, face, and neck: Secondary | ICD-10-CM | POA: Diagnosis present

## 2021-09-01 DIAGNOSIS — J910 Malignant pleural effusion: Secondary | ICD-10-CM | POA: Diagnosis present

## 2021-09-01 DIAGNOSIS — R188 Other ascites: Secondary | ICD-10-CM | POA: Diagnosis present

## 2021-09-01 DIAGNOSIS — Z880 Allergy status to penicillin: Secondary | ICD-10-CM

## 2021-09-01 DIAGNOSIS — Z8572 Personal history of non-Hodgkin lymphomas: Secondary | ICD-10-CM | POA: Diagnosis not present

## 2021-09-01 DIAGNOSIS — C8310 Mantle cell lymphoma, unspecified site: Secondary | ICD-10-CM | POA: Diagnosis not present

## 2021-09-01 DIAGNOSIS — J9601 Acute respiratory failure with hypoxia: Secondary | ICD-10-CM | POA: Diagnosis not present

## 2021-09-01 DIAGNOSIS — C23 Malignant neoplasm of gallbladder: Secondary | ICD-10-CM | POA: Diagnosis not present

## 2021-09-01 DIAGNOSIS — R5381 Other malaise: Secondary | ICD-10-CM | POA: Diagnosis not present

## 2021-09-01 DIAGNOSIS — Z86010 Personal history of colonic polyps: Secondary | ICD-10-CM

## 2021-09-01 DIAGNOSIS — Z8546 Personal history of malignant neoplasm of prostate: Secondary | ICD-10-CM

## 2021-09-01 DIAGNOSIS — R918 Other nonspecific abnormal finding of lung field: Secondary | ICD-10-CM | POA: Diagnosis not present

## 2021-09-01 DIAGNOSIS — Z87442 Personal history of urinary calculi: Secondary | ICD-10-CM

## 2021-09-01 DIAGNOSIS — M25559 Pain in unspecified hip: Secondary | ICD-10-CM | POA: Diagnosis present

## 2021-09-01 LAB — PROTIME-INR
INR: 1.1 (ref 0.8–1.2)
Prothrombin Time: 14.2 seconds (ref 11.4–15.2)

## 2021-09-01 LAB — CBC WITH DIFFERENTIAL/PLATELET
Abs Immature Granulocytes: 0.29 10*3/uL — ABNORMAL HIGH (ref 0.00–0.07)
Basophils Absolute: 0.1 10*3/uL (ref 0.0–0.1)
Basophils Relative: 1 %
Eosinophils Absolute: 0 10*3/uL (ref 0.0–0.5)
Eosinophils Relative: 0 %
HCT: 26.3 % — ABNORMAL LOW (ref 39.0–52.0)
Hemoglobin: 7.7 g/dL — ABNORMAL LOW (ref 13.0–17.0)
Immature Granulocytes: 2 %
Lymphocytes Relative: 42 %
Lymphs Abs: 5.4 10*3/uL — ABNORMAL HIGH (ref 0.7–4.0)
MCH: 30 pg (ref 26.0–34.0)
MCHC: 29.3 g/dL — ABNORMAL LOW (ref 30.0–36.0)
MCV: 102.3 fL — ABNORMAL HIGH (ref 80.0–100.0)
Monocytes Absolute: 1.9 10*3/uL — ABNORMAL HIGH (ref 0.1–1.0)
Monocytes Relative: 15 %
Neutro Abs: 5.1 10*3/uL (ref 1.7–7.7)
Neutrophils Relative %: 40 %
Platelets: 88 10*3/uL — ABNORMAL LOW (ref 150–400)
RBC: 2.57 MIL/uL — ABNORMAL LOW (ref 4.22–5.81)
RDW: 19.2 % — ABNORMAL HIGH (ref 11.5–15.5)
WBC: 12.8 10*3/uL — ABNORMAL HIGH (ref 4.0–10.5)
nRBC: 0 % (ref 0.0–0.2)

## 2021-09-01 LAB — COMPREHENSIVE METABOLIC PANEL
ALT: 22 U/L (ref 0–44)
AST: 55 U/L — ABNORMAL HIGH (ref 15–41)
Albumin: 2.2 g/dL — ABNORMAL LOW (ref 3.5–5.0)
Alkaline Phosphatase: 305 U/L — ABNORMAL HIGH (ref 38–126)
Anion gap: 19 — ABNORMAL HIGH (ref 5–15)
BUN: 75 mg/dL — ABNORMAL HIGH (ref 8–23)
CO2: 22 mmol/L (ref 22–32)
Calcium: 7.7 mg/dL — ABNORMAL LOW (ref 8.9–10.3)
Chloride: 102 mmol/L (ref 98–111)
Creatinine, Ser: 1.93 mg/dL — ABNORMAL HIGH (ref 0.61–1.24)
GFR, Estimated: 33 mL/min — ABNORMAL LOW (ref >60)
Glucose, Bld: 83 mg/dL (ref 70–99)
Potassium: 3.5 mmol/L (ref 3.5–5.1)
Sodium: 143 mmol/L (ref 135–145)
Total Bilirubin: 1 mg/dL (ref 0.3–1.2)
Total Protein: 4.5 g/dL — ABNORMAL LOW (ref 6.5–8.1)

## 2021-09-01 LAB — HEMOGLOBIN AND HEMATOCRIT, BLOOD
HCT: 27 % — ABNORMAL LOW (ref 39.0–52.0)
Hemoglobin: 8.1 g/dL — ABNORMAL LOW (ref 13.0–17.0)

## 2021-09-01 LAB — BRAIN NATRIURETIC PEPTIDE: B Natriuretic Peptide: 293.2 pg/mL — ABNORMAL HIGH (ref 0.0–100.0)

## 2021-09-01 MED ORDER — CHLORHEXIDINE GLUCONATE CLOTH 2 % EX PADS
6.0000 | MEDICATED_PAD | Freq: Every day | CUTANEOUS | Status: DC
  Administered 2021-09-02 – 2021-09-07 (×6): 6 via TOPICAL

## 2021-09-01 MED ORDER — ACETAMINOPHEN 650 MG RE SUPP: 650.0000 mg | Freq: Four times a day (QID) | RECTAL | Status: DC | PRN

## 2021-09-01 MED ORDER — LEVOFLOXACIN IN D5W 500 MG/100ML IV SOLN
500.0000 mg | INTRAVENOUS | Status: DC
  Filled 2021-09-01: qty 100

## 2021-09-01 MED ORDER — METRONIDAZOLE 500 MG/100ML IV SOLN
500.0000 mg | INTRAVENOUS | Status: DC
  Filled 2021-09-01: qty 100

## 2021-09-01 MED ORDER — PANTOPRAZOLE SODIUM 40 MG IV SOLR
40.0000 mg | Freq: Two times a day (BID) | INTRAVENOUS | Status: DC
  Administered 2021-09-01 – 2021-09-04 (×6): 40 mg via INTRAVENOUS
  Filled 2021-09-01 (×6): qty 10

## 2021-09-01 MED ORDER — MIDODRINE HCL 5 MG PO TABS
5.0000 mg | ORAL_TABLET | Freq: Three times a day (TID) | ORAL | Status: DC
  Administered 2021-09-02 – 2021-09-08 (×20): 5 mg via ORAL
  Filled 2021-09-01 (×21): qty 1

## 2021-09-01 MED ORDER — FUROSEMIDE 10 MG/ML IJ SOLN
20.0000 mg | Freq: Once | INTRAMUSCULAR | Status: DC
Start: 2021-09-01 — End: 2021-09-01
  Filled 2021-09-01: qty 4

## 2021-09-01 MED ORDER — MIDODRINE HCL 5 MG PO TABS
2.5000 mg | ORAL_TABLET | Freq: Three times a day (TID) | ORAL | Status: DC
  Administered 2021-09-01: 2.5 mg via ORAL
  Filled 2021-09-01: qty 1

## 2021-09-01 MED ORDER — HEPARIN SOD (PORK) LOCK FLUSH 100 UNIT/ML IV SOLN
500.0000 [IU] | INTRAVENOUS | Status: DC | PRN
Start: 2021-09-01 — End: 2021-09-02

## 2021-09-01 MED ORDER — SODIUM CHLORIDE 0.9 % IV BOLUS: 1000.0000 mL | Freq: Once | INTRAVENOUS | Status: DC

## 2021-09-01 MED ORDER — PANTOPRAZOLE SODIUM 40 MG IV SOLR
40.0000 mg | Freq: Once | INTRAVENOUS | Status: AC
  Administered 2021-09-01: 40 mg via INTRAVENOUS
  Filled 2021-09-01: qty 10

## 2021-09-01 MED ORDER — FENTANYL CITRATE (PF) 100 MCG/2ML IJ SOLN: INTRAMUSCULAR | Status: DC | PRN

## 2021-09-01 MED ORDER — ENSURE ENLIVE PO LIQD
237.0000 mL | Freq: Two times a day (BID) | ORAL | Status: DC
  Administered 2021-09-02 – 2021-09-05 (×3): 237 mL via ORAL

## 2021-09-01 MED ORDER — ALBUMIN HUMAN 25 % IV SOLN
12.5000 g | Freq: Once | INTRAVENOUS | Status: AC
Start: 2021-09-01 — End: 2021-09-01
  Administered 2021-09-01: 12.5 g via INTRAVENOUS
  Filled 2021-09-01: qty 50

## 2021-09-01 MED ORDER — SODIUM CHLORIDE 0.9% FLUSH
10.0000 mL | Freq: Two times a day (BID) | INTRAVENOUS | Status: DC
  Administered 2021-09-01 – 2021-09-07 (×8): 10 mL

## 2021-09-01 MED ORDER — MIDAZOLAM HCL 2 MG/2ML IJ SOLN: INTRAMUSCULAR | Status: DC | PRN

## 2021-09-01 MED ORDER — SODIUM CHLORIDE 0.9 % IV SOLN
8.0000 mg | Freq: Once | INTRAVENOUS | Status: AC
  Administered 2021-09-01: 8 mg via INTRAVENOUS
  Filled 2021-09-01: qty 4

## 2021-09-01 MED ORDER — ONDANSETRON HCL 4 MG/2ML IJ SOLN
4.0000 mg | Freq: Four times a day (QID) | INTRAMUSCULAR | Status: DC | PRN
  Administered 2021-09-02 – 2021-09-07 (×2): 4 mg via INTRAVENOUS
  Filled 2021-09-01 (×2): qty 2

## 2021-09-01 MED ORDER — PANTOPRAZOLE SODIUM 40 MG IV SOLR
40.0000 mg | Freq: Once | INTRAVENOUS | Status: DC
  Filled 2021-09-01: qty 10

## 2021-09-01 MED ORDER — SODIUM CHLORIDE 0.9 % IV SOLN: INTRAVENOUS | Status: DC

## 2021-09-01 MED ORDER — DEXAMETHASONE SODIUM PHOSPHATE 10 MG/ML IJ SOLN
8.0000 mg | Freq: Once | INTRAMUSCULAR | Status: DC
  Filled 2021-09-01: qty 1

## 2021-09-01 MED ORDER — LIDOCAINE HCL 1 % IJ SOLN
INTRAMUSCULAR | Status: AC
  Filled 2021-09-01: qty 20

## 2021-09-01 MED ORDER — ACETAMINOPHEN 325 MG PO TABS
650.0000 mg | ORAL_TABLET | Freq: Four times a day (QID) | ORAL | Status: DC | PRN
  Administered 2021-09-01 – 2021-09-04 (×2): 650 mg via ORAL
  Filled 2021-09-01 (×2): qty 2

## 2021-09-01 MED ORDER — MIDODRINE HCL 5 MG PO TABS: 2.5000 mg | ORAL_TABLET | Freq: Three times a day (TID) | ORAL | Status: DC

## 2021-09-01 MED ORDER — ONDANSETRON HCL 4 MG PO TABS
4.0000 mg | ORAL_TABLET | Freq: Four times a day (QID) | ORAL | Status: DC | PRN
  Administered 2021-09-04 – 2021-09-05 (×2): 4 mg via ORAL
  Filled 2021-09-01 (×2): qty 1

## 2021-09-01 NOTE — H&P (Signed)
History and Physical    Patient: Mario Garcia GSU:[id number] DOB: 04/29/1931 DOA: 09/01/2021 DOS: the patient was seen and examined on 09/01/2021 PCP: Denita Lung, MD  Patient coming from: Home  Chief Complaint:  Chief Complaint  Patient presents with   Leg Swelling   HPI: Mario Garcia is a 88 y.o. male with medical history significant of mantle cell lymphoma, gallbladder CA, HTN. Presenting with BLE edema. History from wife by phone. Symptoms started 2 weeks ago. He spoke with his doctor about it. He was started on lasix. During this time, he also noted some shortness of breath that seemed worse when lying down. He didn't have cough or fever. He didn't have any sick contacts. She notes that his swelling did not improve. He went for Y-90 treatment with interventional radiology this morning. When they noticed his Scr was greatly elevated and he had significant swelling, he was sent to the ED for evaluation. He denies any other aggravating or alleviating factors.     Review of Systems: As mentioned in the history of present illness. All other systems reviewed and are negative. Past Medical History:  Diagnosis Date   Arthritis    Cancer ([place])    PROSTATE   History of colonic polyps    History of kidney stones    Hypertension    Inguinal hernia 03/2002   Pneumonia    "years ago"   Past Surgical History:  Procedure Laterality Date   CATARACT EXTRACTION Bilateral    CHOLECYSTECTOMY N/A 12/14/2020   Procedure: LAPAROSCOPIC CHOLECYSTECTOMY;  Surgeon: Stark Klein, MD;  Location: [facility];  Service: General;  Laterality: N/A;   COLONOSCOPY     DIRECT LARYNGOSCOPY N/A 07/08/2018   Procedure: DIRECT LARYNGOSCOPY;  Surgeon: Leta Baptist, MD;  Location: [facility];  Service: ENT;  Laterality: N/A;   ESOPHAGOSCOPY N/A 07/08/2018   Procedure: ESOPHAGOSCOPY;  Surgeon: Leta Baptist, MD;  Location: [facility];  Service: ENT;  Laterality: N/A;   EYE SURGERY     FLEXIBLE BRONCHOSCOPY N/A 07/08/2018   Procedure:  FLEXIBLE BRONCHOSCOPY;  Surgeon: Leta Baptist, MD;  Location: [facility];  Service: ENT;  Laterality: N/A;   HERNIA REPAIR Left    inguinal hernia   INGUINAL HERNIA REPAIR Right 09/03/2020   Procedure: OPEN RIGHT INGUINAL HERNIA REPAIR;  Surgeon: Clovis Riley, MD;  Location: [facility];  Service: General;  Laterality: Right;   IR 3D INDEPENDENT WKST  07/28/2021   IR ANGIOGRAM SELECTIVE EACH ADDITIONAL VESSEL  07/28/2021   IR ANGIOGRAM SELECTIVE EACH ADDITIONAL VESSEL  07/28/2021   IR ANGIOGRAM SELECTIVE EACH ADDITIONAL VESSEL  07/28/2021   IR ANGIOGRAM SELECTIVE EACH ADDITIONAL VESSEL  07/28/2021   IR ANGIOGRAM SELECTIVE EACH ADDITIONAL VESSEL  08/04/2021   IR ANGIOGRAM SELECTIVE EACH ADDITIONAL VESSEL  08/04/2021   IR ANGIOGRAM VISCERAL SELECTIVE  07/28/2021   IR ANGIOGRAM VISCERAL SELECTIVE  08/04/2021   IR EMBO TUMOR ORGAN ISCHEMIA INFARCT INC GUIDE ROADMAPPING  08/04/2021   IR IMAGING GUIDED PORT INSERTION  07/30/2018   IR RADIOLOGIST EVAL & MGMT  06/15/2021   IR US GUIDE VASC ACCESS LEFT  08/04/2021   IR US GUIDE VASC ACCESS RIGHT  07/28/2021   MASS BIOPSY Right 07/08/2018   Procedure: RIGHT NECK MASS EXCISIONAL BIOPSY;  Surgeon: Leta Baptist, MD;  Location: [facility];  Service: ENT;  Laterality: Right;   PATELLA FRACTURE SURGERY Left    PROSTATE SURGERY  10/2005   RADIAL PROSTATECTOMY   ROTATOR CUFF REPAIR Bilateral  Social History:  reports that he has quit smoking. He has never used smokeless tobacco. He reports current alcohol use of about 6.0 standard drinks of alcohol per week. He reports that he does not use drugs.  Allergies  Allergen Reactions   Lisinopril Cough   Penicillins Rash    Did it involve swelling of the face/tongue/throat, SOB, or low BP? No Did it involve sudden or severe rash/hives, skin peeling, or any reaction on the inside of your mouth or nose? Yes Did you need to seek medical attention at a hospital or doctor's office? Yes When did it last happen?   50  years If all above answers  are "NO", may proceed with cephalosporin use.    Family History  Problem Relation Age of Onset   Cancer Father    Cancer Brother     Prior to Admission medications   Medication Sig Start Date End Date Taking? Authorizing Provider  Acalabrutinib Maleate (CALQUENCE) 100 MG TABS Take 100 mg by mouth 2 (two) times daily. 07/14/21   Brunetta Genera, MD  acetaminophen (TYLENOL) 500 MG tablet Take 1,000 mg by mouth every 6 (six) hours as needed for moderate pain.    [provider]  ARTIFICIAL TEAR SOLUTION OP Place 1 drop into both eyes 2 (two) times daily.    [provider]  calcium carbonate (TUMS - DOSED IN MG ELEMENTAL CALCIUM) 500 MG chewable tablet Chew 2 tablets by mouth daily as needed for indigestion or heartburn.    [provider]  furosemide (LASIX) 20 MG tablet Take 1 tablet (20 mg total) by mouth daily. 08/10/21 10/09/21  Brunetta Genera, MD  gabapentin (NEURONTIN) 100 MG capsule Take 2 capsules (200 mg total) by mouth at bedtime. 08/10/21   Brunetta Genera, MD  ibuprofen (ADVIL) 200 MG tablet Take 200 mg by mouth 3 (three) times daily.    [provider]  latanoprost (XALATAN) 0.005 % ophthalmic solution Place 1 drop into both eyes at bedtime.    [provider]  lidocaine-prilocaine (EMLA) cream Apply 1 application. topically as needed (port access).    [provider]  losartan-hydrochlorothiazide (HYZAAR) 50-12.5 MG tablet TAKE 1 TABLET BY MOUTH EVERY DAY 04/18/21   Denita Lung, MD  ondansetron (ZOFRAN) 4 MG tablet Take 1 tablet (4 mg total) by mouth every 8 (eight) hours as needed for nausea or vomiting. 07/11/21   Brunetta Genera, MD  PRESCRIPTION MEDICATION Apply 1 application. topically daily as needed (sun exposure). FLUOROURACIL 5% + CALCIPOTRIENE 0.005%    [provider]  senna-docusate (SENNA S) 8.6-50 MG tablet Take 2 tablets by mouth at bedtime as needed for mild constipation. 08/10/21    Brunetta Genera, MD  traMADol (ULTRAM) 50 MG tablet Take 1 tablet (50 mg total) by mouth every 8 (eight) hours as needed. 08/10/21   Brunetta Genera, MD    Physical Exam: Vitals:   09/01/21 1053 09/01/21 1055 09/01/21 1057  BP:   (!) 107/55  Pulse: 74    Resp: 16    Temp: 98 F (36.7 C)    TempSrc: Oral    SpO2: 96%    Weight:  68.9 kg   Height:  _0  (1.778 m)    General: 88 y.o. male resting in bed in NAD Eyes: PERRL, normal sclera ENMT: Nares patent w/o discharge, orophaynx clear, dentition normal, ears w/o discharge/lesions/ulcers Neck: Supple, trachea midline Cardiovascular: RRR, +S1, S2, no g/r, 1/6 SEM, equal pulses throughout  Respiratory: CTABL, no w/r/r, normal WOB GI: BS+, NDNT, no masses noted, no organomegaly noted MSK: No c/c; BLE 4+ pitting edema extending to pelvis, scrotum Neuro: A&O x 3, no focal deficits Psyc: Appropriate interaction and affect, calm/cooperative  Data Reviewed:  Na+  143 BUN  75 Scr  1.93 Alk phos  305 Albumin  2.2 WBC  12.8 Hgb 7.7 MCV  102 Plt 88  Assessment and Plan: Anasarca BLE Edema     - admit to inpt, tele     - DVT studies pending; given low albumin levels, question if this is more third spacing than anything else     - give albumin x 1, add midodrine, and we will give low dose lasix this afternoon     - follow I&O     - check BNP  Mantle Cell Lymphoma Metastatic Gallbladder carcinoma     - followed by Dr. Irene Limbo     - prognosis is poor     - Palliative care consult for goals of care  Macrocytic anemia     - he has had a 2pt drop in the Hgb since mid-May     - he reports BRBPR but thinks it's from hemorrhoids     - right now let's trend Hgb, transfuse for Hgb < 7     - continue protonix  AKI     - he's intravascularly depleted     - giving albumin     - check renal US  Thrombocytopenia     - secondary to liver disease/CA  HTN     - pressures are soft, hold home regimen  Advance Care Planning:    Code Status: FULL  Consults: EDP spoke with [facility]  Family Communication: w/ wife by phone  Severity of Illness: The appropriate patient status for this patient is INPATIENT. Inpatient status is judged to be reasonable and necessary in order to provide the required intensity of service to ensure the patient's safety. The patient's presenting symptoms, physical exam findings, and initial radiographic and laboratory data in the context of their chronic comorbidities is felt to place them at high risk for further clinical deterioration. Furthermore, it is not anticipated that the patient will be medically stable for discharge from the hospital within 2 midnights of admission.   * I certify that at the point of admission it is my clinical judgment that the patient will require inpatient hospital care spanning beyond 2 midnights from the point of admission due to high intensity of service, high risk for further deterioration and high frequency of surveillance required.*  Author: Jonnie Finner, DO 09/01/2021 11:30 AM  For on call review [email or web address].

## 2021-09-01 NOTE — ED Triage Notes (Signed)
Pt brought to ED from short stay to r/o dvt, possible dehydration. Pt denies pain, c/o swelling to bilateral lower legs

## 2021-09-01 NOTE — ED Notes (Signed)
Family updated as to patient's status.

## 2021-09-01 NOTE — ED Provider Notes (Signed)
[facility] DEPT Provider Note   CSN: [id number] Arrival date & time: 09/01/21  1050     History  Chief Complaint  Patient presents with   Leg Swelling    Mario Garcia is a 88 y.o. male.  88 yo M with a chief complaints of leg swelling that goes up to the scrotum.  The patient unfortunately has liver cancer and was in interventional radiology to have a procedure performed and unfortunately was noted to have this worsening leg swelling and new acute kidney injury.  Decision was made to send him to the ED for a DVT study.  The patient tells me he has been very weak for the past few days.  He has had some bright red blood in his stool but thinks it is due to hemorrhoids.        Home Medications Prior to Admission medications   Medication Sig Start Date End Date Taking? Authorizing Provider  Acalabrutinib Maleate (CALQUENCE) 100 MG TABS Take 100 mg by mouth 2 (two) times daily. 07/14/21   Brunetta Genera, MD  acetaminophen (TYLENOL) 500 MG tablet Take 1,000 mg by mouth every 6 (six) hours as needed for moderate pain.    [provider]  ARTIFICIAL TEAR SOLUTION OP Place 1 drop into both eyes 2 (two) times daily.    [provider]  calcium carbonate (TUMS - DOSED IN MG ELEMENTAL CALCIUM) 500 MG chewable tablet Chew 2 tablets by mouth daily as needed for indigestion or heartburn.    [provider]  furosemide (LASIX) 20 MG tablet Take 1 tablet (20 mg total) by mouth daily. 08/10/21 10/09/21  Brunetta Genera, MD  gabapentin (NEURONTIN) 100 MG capsule Take 2 capsules (200 mg total) by mouth at bedtime. 08/10/21   Brunetta Genera, MD  ibuprofen (ADVIL) 200 MG tablet Take 200 mg by mouth 3 (three) times daily.    [provider]  latanoprost (XALATAN) 0.005 % ophthalmic solution Place 1 drop into both eyes at bedtime.    [provider]  lidocaine-prilocaine (EMLA) cream Apply 1 application. topically as  needed (port access).    [provider]  losartan-hydrochlorothiazide (HYZAAR) 50-12.5 MG tablet TAKE 1 TABLET BY MOUTH EVERY DAY 04/18/21   Denita Lung, MD  ondansetron (ZOFRAN) 4 MG tablet Take 1 tablet (4 mg total) by mouth every 8 (eight) hours as needed for nausea or vomiting. 07/11/21   Brunetta Genera, MD  PRESCRIPTION MEDICATION Apply 1 application. topically daily as needed (sun exposure). FLUOROURACIL 5% + CALCIPOTRIENE 0.005%    [provider]  senna-docusate (SENNA S) 8.6-50 MG tablet Take 2 tablets by mouth at bedtime as needed for mild constipation. 08/10/21   Brunetta Genera, MD  traMADol (ULTRAM) 50 MG tablet Take 1 tablet (50 mg total) by mouth every 8 (eight) hours as needed. 08/10/21   Brunetta Genera, MD      Allergies    Lisinopril and Penicillins    Review of Systems   Review of Systems  Physical Exam Updated Vital Signs BP (!) 107/55   Pulse 74   Temp 98 F (36.7 C) (Oral)   Resp 16   Ht '5\' 10"'$  (1.778 m)   Wt 68.9 kg   SpO2 96%   BMI 21.79 kg/m  Physical Exam Vitals and nursing note reviewed.  Constitutional:      Appearance: He is well-developed.  HENT:     Head: Normocephalic and atraumatic.  Eyes:  Pupils: Pupils are equal, round, and reactive to light.  Neck:     Vascular: No JVD.  Cardiovascular:     Rate and Rhythm: Normal rate and regular rhythm.     Heart sounds: No murmur heard.    No friction rub. No gallop.  Pulmonary:     Effort: No respiratory distress.     Breath sounds: No wheezing.  Abdominal:     General: There is distension.     Tenderness: There is no abdominal tenderness. There is no guarding or rebound.     Comments: Positive fluid wave.  Musculoskeletal:        General: Normal range of motion.     Cervical back: Normal range of motion and neck supple.     Right lower leg: Edema present.     Left lower leg: Edema present.     Comments: 4+ edema up to the scrotum  Skin:     Coloration: Skin is not pale.     Findings: No rash.  Neurological:     Mental Status: He is alert and oriented to person, place, and time.  Psychiatric:        Behavior: Behavior normal.     ED Results / Procedures / Treatments   Labs (all labs ordered are listed, but only abnormal results are displayed) Labs Reviewed - No data to display  EKG None  Radiology No results found.  Procedures Procedures    Medications Ordered in ED Medications  pantoprazole (PROTONIX) injection 40 mg (40 mg Intravenous Given 09/01/21 1115)    ED Course/ Medical Decision Making/ A&P                           Medical Decision Making Risk Prescription drug management.   88 yo M with a chief complaints of leg swelling and fatigue.  The patient had gone to interventional radiology this morning for targeted therapy for mantle cell carcinoma but unfortunately had an AKI and was unable to have the angiogram performed.  He was then sent to the emergency department for DVT studies.  When I reviewed the patient's blood work it looks like he has had a 3 g drop of hemoglobin, he also has an acute kidney injury.  I discussed the case with his oncologist, Dr. Irene Limbo.  He felt that the patient likely had taken a turn for the worst and recommended bringing the patient into the hospital to try and optimize his fluid balance and kidney function and he would see the patient in the morning and have a discussion of goals of care.  We will discuss with the hospitalist.  The patients results and plan were reviewed and discussed.   Any x-rays performed were independently reviewed by myself.   Differential diagnosis were considered with the presenting HPI.  Medications  pantoprazole (PROTONIX) injection 40 mg (40 mg Intravenous Given 09/01/21 1115)    Vitals:   09/01/21 1053 09/01/21 1055 09/01/21 1057  BP:   (!) 107/55  Pulse: 74    Resp: 16    Temp: 98 F (36.7 C)    TempSrc: Oral    SpO2: 96%    Weight:   68.9 kg   Height:  '5\' 10"'$  (1.778 m)     Final diagnoses:  Anasarca  Bilateral lower extremity edema  AKI (acute kidney injury) ([place])  Uremia    Admission/ observation were discussed with the admitting physician, patient and/or family and they are  comfortable with the plan.          Final Clinical Impression(s) / ED Diagnoses Final diagnoses:  Anasarca  Bilateral lower extremity edema  AKI (acute kidney injury) ([place])  Uremia    Rx / DC Orders ED Discharge Orders     None         Deno Etienne, DO 09/01/21 1131

## 2021-09-01 NOTE — Progress Notes (Signed)
0910 K.Allred, PA to call wife to discuss new plan of care for today.

## 2021-09-01 NOTE — Progress Notes (Signed)
Patient ID: Mario Garcia, male   DOB: [birth year], 88 y.o.   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 Kathlene Cote on 06/15/2021 to discuss liver directed therapy for metastatic adenocarcinoma of the gallbladder/biliary tract.  He has a significant past medical history including diffuse large B-cell lymphoma currently in remission, stage IV mantle cell lymphoma, prostate cancer, anemia, thrombocytopenia, prior cholecystectomy on 12/29/2020, arthritis, hyperlipidemia, colon polyps, nephrolithiasis, hypertension.  Following discussions with Dr. Kathlene Cote patient was deemed an appropriate candidate for hepatic Y90 radioembolization.  He underwent right hepatic lobe Y90 radioembolization on 08/04/2021.  He presents again today for left hepatic lobe Y90 radioembolization.  Since patient's most recent treatment he has reported some persistent weakness as well as bilateral lower extremity edema which has progressed up to inguinal regions and also involving scrotum.  Patient also has dyspnea with exertion, persistent back pain/sciatica with radiation down left lower extremity, occasional reflux, some recent nausea and vomiting.  He has also had diminished appetite and decreased p.o. intake.   Past Medical History:  Diagnosis Date   Arthritis    Cancer ([place])    PROSTATE   History of colonic polyps    History of kidney stones    Hypertension    Inguinal hernia 03/2002   Pneumonia    "years ago"   Past Surgical History:  Procedure Laterality Date   CATARACT EXTRACTION Bilateral    CHOLECYSTECTOMY N/A 12/14/2020   Procedure: LAPAROSCOPIC CHOLECYSTECTOMY;  Surgeon: Stark Klein, MD;   Location: [facility];  Service: General;  Laterality: N/A;   COLONOSCOPY     DIRECT LARYNGOSCOPY N/A 07/08/2018   Procedure: DIRECT LARYNGOSCOPY;  Surgeon: Leta Baptist, MD;  Location: [facility];  Service: ENT;  Laterality: N/A;   ESOPHAGOSCOPY N/A 07/08/2018   Procedure: ESOPHAGOSCOPY;  Surgeon: Leta Baptist, MD;  Location: [facility];  Service: ENT;  Laterality: N/A;   EYE SURGERY     FLEXIBLE BRONCHOSCOPY N/A 07/08/2018   Procedure: FLEXIBLE BRONCHOSCOPY;  Surgeon: Leta Baptist, MD;  Location: [facility];  Service: ENT;  Laterality: N/A;   HERNIA REPAIR Left    inguinal hernia   INGUINAL HERNIA REPAIR Right 09/03/2020   Procedure: OPEN RIGHT INGUINAL HERNIA REPAIR;  Surgeon: Clovis Riley, MD;  Location: [facility];  Service: General;  Laterality: Right;   IR 3D INDEPENDENT WKST  07/28/2021   IR ANGIOGRAM SELECTIVE EACH ADDITIONAL VESSEL  07/28/2021   IR ANGIOGRAM SELECTIVE EACH ADDITIONAL VESSEL  07/28/2021   IR ANGIOGRAM SELECTIVE EACH ADDITIONAL VESSEL  07/28/2021   IR ANGIOGRAM SELECTIVE EACH ADDITIONAL VESSEL  07/28/2021   IR ANGIOGRAM SELECTIVE EACH ADDITIONAL VESSEL  08/04/2021   IR ANGIOGRAM SELECTIVE EACH ADDITIONAL VESSEL  08/04/2021   IR ANGIOGRAM VISCERAL SELECTIVE  07/28/2021   IR ANGIOGRAM VISCERAL SELECTIVE  08/04/2021   IR EMBO TUMOR ORGAN ISCHEMIA INFARCT INC GUIDE ROADMAPPING  08/04/2021   IR IMAGING GUIDED PORT INSERTION  07/30/2018   IR RADIOLOGIST EVAL & MGMT  06/15/2021   IR US GUIDE VASC ACCESS LEFT  08/04/2021   IR US GUIDE VASC ACCESS RIGHT  07/28/2021   MASS BIOPSY Right 07/08/2018   Procedure: RIGHT NECK MASS EXCISIONAL BIOPSY;  Surgeon: Leta Baptist, MD;  Location: [facility];  Service: ENT;  Laterality: Right;   PATELLA FRACTURE SURGERY Left    PROSTATE SURGERY  10/2005   RADIAL PROSTATECTOMY   ROTATOR CUFF REPAIR Bilateral      Allergies: Lisinopril and Penicillins  Medications: Prior to Admission medications   Medication Sig Start Date End Date Taking? Authorizing Provider  Acalabrutinib Maleate  (CALQUENCE) 100 MG TABS Take 100 mg by mouth 2 (two) times daily. 07/14/21  Yes Brunetta Genera, MD  ARTIFICIAL TEAR SOLUTION OP Place 1 drop into both eyes 2 (two) times daily.   Yes [provider]  calcium carbonate (TUMS - DOSED IN MG ELEMENTAL CALCIUM) 500 MG chewable tablet Chew 2 tablets by mouth daily as needed for indigestion or heartburn.   Yes [provider]  furosemide (LASIX) 20 MG tablet Take 1 tablet (20 mg total) by mouth daily. 08/10/21 10/09/21 Yes Brunetta Genera, MD  gabapentin (NEURONTIN) 100 MG capsule Take 2 capsules (200 mg total) by mouth at bedtime. 08/10/21  Yes Brunetta Genera, MD  ibuprofen (ADVIL) 200 MG tablet Take 200 mg by mouth 3 (three) times daily.   Yes [provider]  latanoprost (XALATAN) 0.005 % ophthalmic solution Place 1 drop into both eyes at bedtime.   Yes [provider]  lidocaine-prilocaine (EMLA) cream Apply 1 application. topically as needed (port access).   Yes [provider]  losartan-hydrochlorothiazide (HYZAAR) 50-12.5 MG tablet TAKE 1 TABLET BY MOUTH EVERY DAY 04/18/21  Yes Denita Lung, MD  ondansetron (ZOFRAN) 4 MG tablet Take 1 tablet (4 mg total) by mouth every 8 (eight) hours as needed for nausea or vomiting. 07/11/21  Yes Brunetta Genera, MD  senna-docusate (SENNA S) 8.6-50 MG tablet Take 2 tablets by mouth at bedtime as needed for mild constipation. 08/10/21  Yes Brunetta Genera, MD  acetaminophen (TYLENOL) 500 MG tablet Take 1,000 mg by mouth every 6 (six) hours as needed for moderate pain.    [provider]  PRESCRIPTION MEDICATION Apply 1 application. topically daily as needed (sun exposure). FLUOROURACIL 5% + CALCIPOTRIENE 0.005%    [provider]  traMADol (ULTRAM) 50 MG tablet Take 1 tablet (50 mg total) by mouth every 8 (eight) hours as needed. 08/10/21   Brunetta Genera, MD     Vital Signs: BP 97/73   Temp 97.6 F (36.4 C) (Oral)   Resp  15   Ht _0  (1.778 m)   Wt 152 lb (68.9 kg)   BMI 21.81 kg/m   Physical Exam patient awake, alert.  Appears weak.  Chest with slightly diminished breath sounds right base, left clear.  Clean, intact left chest wall Port-A-Cath.  Heart with normal rate, occasional ectopy.  Abdomen slightly distended, soft, positive bowel sounds, nontender.  Increasing bilateral lower extremity edema noted up to inguinal and scrotal regions.  Imaging: No results found.  Labs:  CBC: Recent Labs    07/28/21 0800 08/04/21 0740 08/10/21 0905 09/01/21 0811  WBC 9.9 11.5* 13.3* 12.8*  HGB 9.7* 9.6* 10.0* 7.7*  HCT 32.1* 31.4* 32.6* 26.3*  PLT 141* 154 127* 88*    COAGS: Recent Labs    06/10/21 1209 07/28/21 0800 08/04/21 0740 09/01/21 0811  INR 1.0 1.1 1.0 1.1    BMP: Recent Labs    07/28/21 0800 08/04/21 0740 08/10/21 0905 09/01/21 0811  NA 139 140 141 143  K 4.3 3.8 3.8 3.5  CL 100 99 100 102  CO2 _1 GLUCOSE 84 83  82 83  BUN 23 26* 21 75*  CALCIUM 8.4* 8.1* 8.2* 7.7*  CREATININE 0.81 1.13 0.80 1.93*  GFRNONAA >60 >60 >60 33*    LIVER FUNCTION TESTS: Recent Labs    07/28/21 0800 08/04/21 0740 08/10/21 0905 09/01/21 0811  BILITOT 0.9 1.1 0.8 1.0  AST 71* 85* 55* 55*  ALT 35 32 25 22  ALKPHOS 412* 394* 373* 305*  PROT 5.5* 5.6* 5.4* 4.5*  ALBUMIN 2.8* 2.7* 3.0* 2.2*    Assessment and Plan: Patient familiar to IR service from Port-A-Cath placement in 2020, right cervical lymph node biopsy in 2021 ,right liver mass biopsy on 06/10/2021 and consultation with Dr. Kathlene Cote on 06/15/2021 to discuss liver directed therapy for metastatic adenocarcinoma of the gallbladder/biliary tract.  He has a significant past medical history including diffuse large B-cell lymphoma currently in remission, stage IV mantle cell lymphoma, prostate cancer, anemia, thrombocytopenia, prior cholecystectomy on 12/29/2020, arthritis, hyperlipidemia, colon polyps, nephrolithiasis, hypertension.   Following discussions with Dr. Kathlene Cote patient was deemed an appropriate candidate for hepatic Y90 radioembolization.  He underwent right hepatic lobe Y90 radioembolization on 08/04/2021.  He presents again today for left hepatic lobe Y90 radioembolization.  Since patient's most recent treatment he has reported some persistent weakness as well as bilateral lower extremity edema which has progressed up to inguinal regions and also involving scrotum.  Patient also has dyspnea with exertion, persistent back pain/sciatica with radiation down left lower extremity, occasional reflux, some recent nausea and vomiting.  He has also had diminished appetite and decreased p.o. intake.  Current labs today include normal PT/INR, WBC 12.8, hemoglobin 7.7 down from 10, platelets 88k, down from 127k; creatinine 1.93 up from 0.8  3 weeks ago, BUN 75, GFR 33.  Patient seen today by Dr. Kathlene Cote and decision made to proceed with bilateral lower extremity venous Doppler to rule out DVT.  Creatinine today is too high to safely perform angiography/Y90 treatment.  Dr. Irene Limbo also updated on patient's current clinical status and recommends transfer to ED for further evaluation.  Patient/wife updated with plans.   Electronically Signed: D. Rowe Robert, PA-C 09/01/2021, 10:04 AM   I spent a total of 25 minutes at the the patient's bedside AND on the patient's hospital floor or unit, greater than 50% of which was counseling/coordinating care for possible left hepatic lobe Y90 radioembolization

## 2021-09-01 NOTE — Progress Notes (Signed)
0900 Protonix not given per Dr. Kathlene Cote [phone] Decadron not given per Dr. Kathlene Cote

## 2021-09-01 NOTE — Progress Notes (Signed)
BLE venous duplex has been completed.   Results can be found under chart review under CV PROC. 09/01/2021 4:34 PM Annalisa Colonna RVT, RDMS

## 2021-09-02 ENCOUNTER — Inpatient Hospital Stay (HOSPITAL_COMMUNITY): Payer: Medicare Other

## 2021-09-02 ENCOUNTER — Other Ambulatory Visit: Payer: Self-pay | Admitting: Hematology

## 2021-09-02 DIAGNOSIS — Z7189 Other specified counseling: Secondary | ICD-10-CM

## 2021-09-02 DIAGNOSIS — C8318 Mantle cell lymphoma, lymph nodes of multiple sites: Secondary | ICD-10-CM | POA: Diagnosis not present

## 2021-09-02 DIAGNOSIS — C23 Malignant neoplasm of gallbladder: Secondary | ICD-10-CM | POA: Diagnosis not present

## 2021-09-02 DIAGNOSIS — R601 Generalized edema: Secondary | ICD-10-CM | POA: Diagnosis not present

## 2021-09-02 LAB — COMPREHENSIVE METABOLIC PANEL
ALT: 21 U/L (ref 0–44)
AST: 56 U/L — ABNORMAL HIGH (ref 15–41)
Albumin: 2 g/dL — ABNORMAL LOW (ref 3.5–5.0)
Alkaline Phosphatase: 287 U/L — ABNORMAL HIGH (ref 38–126)
Anion gap: 18 — ABNORMAL HIGH (ref 5–15)
BUN: 78 mg/dL — ABNORMAL HIGH (ref 8–23)
CO2: 22 mmol/L (ref 22–32)
Calcium: 7.4 mg/dL — ABNORMAL LOW (ref 8.9–10.3)
Chloride: 101 mmol/L (ref 98–111)
Creatinine, Ser: 2.01 mg/dL — ABNORMAL HIGH (ref 0.61–1.24)
GFR, Estimated: 31 mL/min — ABNORMAL LOW (ref >60)
Glucose, Bld: 78 mg/dL (ref 70–99)
Potassium: 3.5 mmol/L (ref 3.5–5.1)
Sodium: 141 mmol/L (ref 135–145)
Total Bilirubin: 0.9 mg/dL (ref 0.3–1.2)
Total Protein: 4.2 g/dL — ABNORMAL LOW (ref 6.5–8.1)

## 2021-09-02 LAB — HEMOGLOBIN AND HEMATOCRIT, BLOOD
HCT: 24.9 % — ABNORMAL LOW (ref 39.0–52.0)
HCT: 27.3 % — ABNORMAL LOW (ref 39.0–52.0)
Hemoglobin: 7.4 g/dL — ABNORMAL LOW (ref 13.0–17.0)
Hemoglobin: 7.9 g/dL — ABNORMAL LOW (ref 13.0–17.0)

## 2021-09-02 MED ORDER — SENNOSIDES-DOCUSATE SODIUM 8.6-50 MG PO TABS
2.0000 | ORAL_TABLET | Freq: Every evening | ORAL | Status: DC | PRN
Start: 2021-09-02 — End: 2021-09-03
  Administered 2021-09-02: 2 via ORAL
  Filled 2021-09-02: qty 2

## 2021-09-02 MED ORDER — TRAMADOL HCL 50 MG PO TABS
50.0000 mg | ORAL_TABLET | Freq: Once | ORAL | Status: AC
  Administered 2021-09-02: 50 mg via ORAL
  Filled 2021-09-02 (×2): qty 1

## 2021-09-02 MED ORDER — IOHEXOL 9 MG/ML PO SOLN
500.0000 mL | ORAL | Status: AC
  Administered 2021-09-02 (×2): 500 mL via ORAL

## 2021-09-02 MED ORDER — ADULT MULTIVITAMIN W/MINERALS CH
1.0000 | ORAL_TABLET | Freq: Every day | ORAL | Status: DC
  Administered 2021-09-02: 1 via ORAL
  Filled 2021-09-02: qty 1

## 2021-09-02 MED ORDER — LATANOPROST 0.005 % OP SOLN
1.0000 [drp] | Freq: Every day | OPHTHALMIC | Status: DC
  Administered 2021-09-02 – 2021-09-07 (×6): 1 [drp] via OPHTHALMIC
  Filled 2021-09-02: qty 2.5

## 2021-09-02 MED ORDER — CALCIUM CARBONATE ANTACID 500 MG PO CHEW: 2.0000 | CHEWABLE_TABLET | Freq: Every day | ORAL | Status: DC | PRN

## 2021-09-02 MED ORDER — ENOXAPARIN SODIUM 30 MG/0.3ML IJ SOSY
30.0000 mg | PREFILLED_SYRINGE | INTRAMUSCULAR | Status: DC
  Administered 2021-09-02: 30 mg via SUBCUTANEOUS
  Filled 2021-09-02: qty 0.3

## 2021-09-02 MED ORDER — GABAPENTIN 100 MG PO CAPS
200.0000 mg | ORAL_CAPSULE | Freq: Every day | ORAL | Status: DC
  Administered 2021-09-02 – 2021-09-07 (×6): 200 mg via ORAL
  Filled 2021-09-02 (×6): qty 2

## 2021-09-02 MED ORDER — TRAMADOL HCL 50 MG PO TABS
50.0000 mg | ORAL_TABLET | Freq: Three times a day (TID) | ORAL | Status: DC | PRN
  Administered 2021-09-02 – 2021-09-08 (×10): 50 mg via ORAL
  Filled 2021-09-02 (×10): qty 1

## 2021-09-02 NOTE — Progress Notes (Signed)
PROGRESS NOTE Mario Garcia  NAT:[id number] DOB: 1931-08-07 DOA: 09/01/2021 PCP: Denita Lung, MD   Brief Narrative/Hospital Course:  88 y.o. male with past medical history significant for mantle cell lymphoma, gallbladder CA, HTN who presented with bilateral lower leg edema, recently placed on Lasix but without improvement. He also had some shortness of breath worse on lying no fever cough sick contacts.He went for Y-90 treatment with interventional radiology 6/8 morning. When they noticed his Scr was greatly elevated and he had significant swelling, he was sent to the ED for evaluation. In the ED underwent further work-up with serum creatinine at 1.9 alk phos 305 WBC 12.8 hemoglobin 7.7 g platelet 88 K, admitted for anasarca heme-onc was consulted.  Duplex of the leg done negative for DVT. He reports had episode of bright red blood a wk ago after bout of constipation and when he used enema he had bleeding.No bleeding since then he had no blood after subsequent Bms.    Subjective: Seen this am, pain feels better, leg edema some better with compression stocking Wife and family at bedside, anxious to see hemonc Dr Irene Limbo.   Assessment and Plan: Principal Problem:   Anasarca Active Problems:   Hypertension   Mantle cell lymphoma (HCC)   Gallbladder cancer (HCC)   Macrocytic anemia   AKI (acute kidney injury) ([place])   Thrombocytopenia (H[facility])   Anasarca Bilateral lower leg edema Pleural effusion/pulmonary edema: Suspect multifactorial in the setting of gallbladder carcinoma, hypoalbuminemia.  Chest x-ray with bilateral hazy lung densities probable pleural effusion/possible pulmonary edema.  BNP 293 borderline elevated-recent echo 08/03/2021 with normal EF 60 to 65%, indeterminate diastolic function mildly elevated pulmonary artery systolic pressure-do not suspect CHF.  Negative for DVT on duplex.  Augment nutritional status getting CT chest abdomen pelvis.  Continue compression stocking elevate  leg holding Lasix due to AKI  Hypertension: BP 90s to 100.  Holding his losartan/HCTZ.  Mantle cell lymphoma  Gallbladder cancer : Discussed with oncology this morning getting CT chest abdomen pelvis if he has progressive disease may need hospice care oncology will evaluate palliative care has been consulted  Macrocytic anemia History of hemorrhoid: bright red bleeding per rectum a week ago after using enema for constipation no recurrence of bleeding since then.  Anemia likely in the setting of his cancer monitor H&H transfuse if less than 7 g.  AKI: Multifactorial in the setting of Lasix HCTZ lisinopril, hypoalbuminemia, soft blood pressure.  Monitor creatinine remains stable augment diet, avoid CT scan further palliative care oncology input for GOC  Thrombocytopenia: Platelet has been dropping 154 on 5/11 and downtrending since.  Monitor Deconditioning/debility PT OT consulted Body mass index is 21.79 kg/m.   DVT prophylaxis: Place TED hose Start: 09/01/21 1822.  Lovenox x1 , if H&H remains stable resume Lovenox or heparin in the morning Code Status:   Code Status: Full Code Family Communication: plan of care discussed with patient/wife and daughter at bedside. Patient status is: Inpatient because of further goals of care anemia AKI Level of care: Telemetry   Dispo: The patient is from: Home            Anticipated disposition: TBD  Mobility Assessment (last 72 hours)     Mobility Assessment     Row Name 09/01/21 1750           Does patient have an order for bedrest or is patient medically unstable No - Continue assessment       What is the highest  level of mobility based on the progressive mobility assessment? Level 4 (Walks with assist in room) - Balance while marching in place and cannot step forward and back - Complete                 Objective: Vitals last 24 hrs: Vitals:   09/01/21 2133 09/02/21 0107 09/02/21 0544 09/02/21 0800  BP: (!) 104/53 (!) 103/55 (!)  106/53   Pulse: 76 90 89   Resp: '17 15 18   ' Temp: 98.7 F (37.1 C) 98.9 F (37.2 C) 98.7 F (37.1 C)   TempSrc:      SpO2: 94% 92% (!) 87% 92%  Weight:      Height:       Weight change:   Physical Examination: General exam: alert awake,older than stated age, weak appearing. HEENT:Oral mucosa moist, Ear/Nose WNL grossly, dentition normal. Respiratory system: bilaterally diminished BS, no use of accessory muscle Cardiovascular system: S1 & S2 +, No JVD. Gastrointestinal system: Abdomen soft,NT,ND, BS+ Nervous System:Alert, awake, moving extremities and grossly nonfocal Extremities: LE edema +++,distal peripheral pulses palpable.  Skin: No rashes,no icterus. MSK: Normal muscle bulk,tone, power  Medications reviewed:  Scheduled Meds:  Chlorhexidine Gluconate Cloth  6 each Topical Daily   feeding supplement  237 mL Oral BID BM   midodrine  5 mg Oral TID WC   pantoprazole (PROTONIX) IV  40 mg Intravenous Q12H   sodium chloride flush  10-40 mL Intracatheter Q12H   Continuous Infusions:    Diet Order             Diet Heart Room service appropriate? Yes; Fluid consistency: Thin  Diet effective now                            Intake/Output Summary (Last 24 hours) at 09/02/2021 1137 Last data filed at 09/02/2021 0950 Gross per 24 hour  Intake 594 ml  Output 400 ml  Net 194 ml   Net IO Since Admission: 194 mL [09/02/21 1137]  Wt Readings from Last 3 Encounters:  09/01/21 68.9 kg  09/01/21 68.9 kg  08/10/21 69.7 kg     Unresulted Labs (From admission, onward)     Start     Ordered   09/03/21 1324  Basic metabolic panel  Daily,   R     Question:  Specimen collection method  Answer:  IV Team=IV Team collect   09/02/21 0851   09/03/21 0500  CBC  Daily,   R     Question:  Specimen collection method  Answer:  IV Team=IV Team collect   09/02/21 0851          Data Reviewed: I have personally reviewed following labs and imaging studies CBC: Recent Labs  Lab  09/01/21 0811 09/01/21 2001 09/02/21 0316  WBC 12.8*  --   --   NEUTROABS 5.1  --   --   HGB 7.7* 8.1* 7.4*  HCT 26.3* 27.0* 24.9*  MCV 102.3*  --   --   PLT 88*  --   --    Basic Metabolic Panel: Recent Labs  Lab 09/01/21 0811 09/02/21 0316  NA 143 141  K 3.5 3.5  CL 102 101  CO2 22 22  GLUCOSE 83 78  BUN 75* 78*  CREATININE 1.93* 2.01*  CALCIUM 7.7* 7.4*   GFR: Estimated Creatinine Clearance: 24.8 mL/min (A) (by C-G formula based on SCr of 2.01 mg/dL (H)). Liver Function Tests: Recent  Labs  Lab 09/01/21 0811 09/02/21 0316  AST 55* 56*  ALT 22 21  ALKPHOS 305* 287*  BILITOT 1.0 0.9  PROT 4.5* 4.2*  ALBUMIN 2.2* 2.0*   No results for input(s): "LIPASE", "AMYLASE" in the last 168 hours. No results for input(s): "AMMONIA" in the last 168 hours. Coagulation Profile: Recent Labs  Lab 09/01/21 0811  INR 1.1   BNP (last 3 results) No results for input(s): "PROBNP" in the last 8760 hours. HbA1C: No results for input(s): "HGBA1C" in the last 72 hours. CBG: No results for input(s): "GLUCAP" in the last 168 hours. Lipid Profile: No results for input(s): "CHOL", "HDL", "LDLCALC", "TRIG", "CHOLHDL", "LDLDIRECT" in the last 72 hours. Thyroid Function Tests: No results for input(s): "TSH", "T4TOTAL", "FREET4", "T3FREE", "THYROIDAB" in the last 72 hours. Sepsis Labs: No results for input(s): "PROCALCITON", "LATICACIDVEN" in the last 168 hours.  No results found for this or any previous visit (from the past 240 hour(s)).  Antimicrobials: Anti-infectives (From admission, onward)    None      Culture/Microbiology No results found for: "SDES", "SPECREQUEST", "CULT", "REPTSTATUS"  Other culture-see note  Radiology Studies: US RENAL  Result Date: 09/01/2021 CLINICAL DATA:  Acute kidney injury. EXAM: RENAL / URINARY TRACT ULTRASOUND COMPLETE COMPARISON:  None Available. FINDINGS: Right Kidney: Renal measurements: 10.8 cm x 5.1 cm x 5.2 cm = volume: 149.4 mL.  Echogenicity within normal limits. No mass or hydronephrosis visualized. Left Kidney: Renal measurements: 11.4 cm x 4.1 cm x 4.6 cm = volume: 113.6 mL. Echogenicity within normal limits. No mass or hydronephrosis visualized. Bladder: Appears normal for degree of bladder distention. Other: Of incidental note is the presence of a moderate amount of ascites. IMPRESSION: 1. Ascites. 2. Normal ultrasonographic appearance of the kidneys and urinary bladder. Electronically Signed   By: Virgina Norfolk M.D.   On: 09/01/2021 20:39   VAS [place] LOWER EXTREMITY VENOUS (DVT) (ONLY [facility] & [facility])  Result Date: 09/01/2021  Lower Venous DVT Study Patient Name:  Mario Garcia  Date of Exam:   09/01/2021 Medical Rec #: [id number]      Accession #:    [id number] Date of Birth: 10-Aug-1931      Patient Gender: M Patient Age:   63 years Exam Location:  [facility] Procedure:      VAS [place] LOWER EXTREMITY VENOUS (DVT) Referring Phys: DAN FLOYD --------------------------------------------------------------------------------  Indications: Edema.  Risk Factors: CA patient. Limitations: Poor ultrasound/tissue interface. Comparison Study: No previous exams Performing Technologist: Jody Hill RVT, RDMS  Examination Guidelines: A complete evaluation includes B-mode imaging, spectral Doppler, color Doppler, and power Doppler as needed of all accessible portions of each vessel. Bilateral testing is considered an integral part of a complete examination. Limited examinations for reoccurring indications may be performed as noted. The reflux portion of the exam is performed with the patient in reverse Trendelenburg.  +---------+---------------+---------+-----------+----------+-------------------+ RIGHT    CompressibilityPhasicitySpontaneityPropertiesThrombus Aging      +---------+---------------+---------+-----------+----------+-------------------+ CFV      Full           Yes      Yes                                       +---------+---------------+---------+-----------+----------+-------------------+ SFJ      Full                                                             +---------+---------------+---------+-----------+----------+-------------------+  FV Prox  Full           Yes      Yes                                      +---------+---------------+---------+-----------+----------+-------------------+ FV Mid   Full           Yes      Yes                                      +---------+---------------+---------+-----------+----------+-------------------+ FV DistalFull           Yes      Yes                                      +---------+---------------+---------+-----------+----------+-------------------+ PFV      Full                                                             +---------+---------------+---------+-----------+----------+-------------------+ POP      Full           Yes      Yes                                      +---------+---------------+---------+-----------+----------+-------------------+ PTV                                                   Not well visualized +---------+---------------+---------+-----------+----------+-------------------+ PERO     Full                                                             +---------+---------------+---------+-----------+----------+-------------------+   +---------+---------------+---------+-----------+----------+--------------+ LEFT     CompressibilityPhasicitySpontaneityPropertiesThrombus Aging +---------+---------------+---------+-----------+----------+--------------+ CFV      Full           Yes      Yes                                 +---------+---------------+---------+-----------+----------+--------------+ SFJ      Full                                                        +---------+---------------+---------+-----------+----------+--------------+ FV Prox  Full           Yes       Yes                                 +---------+---------------+---------+-----------+----------+--------------+  FV Mid   Full           Yes      Yes                                 +---------+---------------+---------+-----------+----------+--------------+ FV DistalFull           Yes      Yes                                 +---------+---------------+---------+-----------+----------+--------------+ PFV      Full                                                        +---------+---------------+---------+-----------+----------+--------------+ POP      Full           Yes      Yes                                 +---------+---------------+---------+-----------+----------+--------------+ PTV      Full                                                        +---------+---------------+---------+-----------+----------+--------------+ PERO     Full                                                        +---------+---------------+---------+-----------+----------+--------------+     Summary: RIGHT: - There is no evidence of deep vein thrombosis in the lower extremity.  - No cystic structure found in the popliteal fossa. - Diffuse subcutaneous edema to entire extremity - Ultrasound characteristics of enlarged lymph nodes are noted in the groin.  LEFT: - There is no evidence of deep vein thrombosis in the lower extremity.  - No cystic structure found in the popliteal fossa. - Diffuse subcutaneous edema to entire extremity - Ultrasound characteristics of enlarged lymph nodes noted in the groin.  *See table(s) above for measurements and observations. Electronically signed by Monica Martinez MD on 09/01/2021 at 4:46:09 PM.    Final    DG CHEST PORT 1 VIEW  Result Date: 09/01/2021 CLINICAL DATA:  Bilateral leg swelling. EXAM: PORTABLE CHEST 1 VIEW COMPARISON:  Chest radiograph 07/27/2021 FINDINGS: Left jugular Port-A-Cath with the tip near the superior cavoatrial junction. Port-A-Cath  tip is stable. New hazy densities in both lungs particularly at the right lung base. Probable pleural effusions, right side greater than left. Enlarged interstitial lung markings. Nodular densities in the lungs could represent enlarged vascular structures. Difficult to exclude pulmonary nodules. Heart size is grossly within normal limits. IMPRESSION: 1. Bilateral hazy lung densities with probable pleural effusions, right side greater than left. Findings are concerning for pulmonary edema. 2. Subtle nodular densities particularly in the left lung. These could represent vascular structures but difficult to exclude pulmonary nodules. Recommend attention  on follow-up imaging and/or further evaluation with chest CT. Electronically Signed   By: Markus Daft M.D.   On: 09/01/2021 14:49     LOS: 1 day   Antonieta Pert, MD [facility]  09/02/2021, 11:37 AM

## 2021-09-02 NOTE — Progress Notes (Signed)
Initial Nutrition Assessment  INTERVENTION:   -Ensure Plus High Protein po BID, each supplement provides 350 kcal and 20 grams of protein.   -Multivitamin with minerals daily  NUTRITION DIAGNOSIS:   Increased nutrient needs related to cancer and cancer related treatments as evidenced by estimated needs.  GOAL:   Patient will meet greater than or equal to 90% of their needs  MONITOR:   PO intake, Supplement acceptance, Labs, Weight trends, I & O's  REASON FOR ASSESSMENT:   Malnutrition Screening Tool    ASSESSMENT:   88 y.o. male with medical history significant of mantle cell lymphoma, gallbladder CA, HTN. Presenting with BLE edema.  Patient unavailable x 2 attempts at this time.  Per chart review, pt with anasarca. Continues to have BLE edema -moderate per nursing documentation.  Ate 50% of breakfast this morning.  Ensure supplements have been ordered and accepting at this time.  Pt reporting 20 lbs of weight loss over the past year.  Per weight records, pt has lost 6 lbs since 12/02/20, insignificant for time frame.  Medications: Zofran  Labs reviewed.  NUTRITION - FOCUSED PHYSICAL EXAM:  Pt having CT  Diet Order:   Diet Order             Diet Heart Room service appropriate? Yes; Fluid consistency: Thin  Diet effective now                   EDUCATION NEEDS:   Not appropriate for education at this time  Skin:  Skin Assessment: Reviewed RN Assessment  Last BM:  6/7  Height:   Ht Readings from Last 1 Encounters:  09/01/21 '5\' 10"'$  (1.778 m)    Weight:   Wt Readings from Last 1 Encounters:  09/01/21 68.9 kg    BMI:  Body mass index is 21.79 kg/m.  Estimated Nutritional Needs:   Kcal:  2000-2200  Protein:  100-110g  Fluid:  2L/day  Clayton Bibles, MS, RD, LDN Inpatient Clinical Dietitian Contact information available via Amion

## 2021-09-02 NOTE — Hospital Course (Signed)
88 y.o. male with past medical history significant for mantle cell lymphoma, gallbladder CA, HTN who presented with bilateral lower leg edema, recently placed on Lasix but without improvement. He also had some shortness of breath worse on lying no fever cough sick contacts.He went for Y-90 treatment with interventional radiology 6/8 morning. When they noticed his Scr was greatly elevated and he had significant swelling, he was sent to the ED for evaluation. In the ED underwent further work-up with serum creatinine at 1.9 alk phos 305 WBC 12.8 hemoglobin 7.7 g platelet 88 K, admitted for anasarca heme-onc was consulted.  Duplex of the leg done negative for DVT. He reports had episode of bright red blood a wk ago after bout of constipation and when he used enema he had bleeding.No bleeding since then he had no blood after subsequent Bms.

## 2021-09-02 NOTE — Progress Notes (Addendum)
HEMATOLOGY-ONCOLOGY PROGRESS NOTE  ASSESSMENT AND PLAN: 88 y.o. male with   1. H/o Diffuse Large B-Cell Lymphoma, Stage IV- now in remission 07/08/18 Right cervical soft tissue biopsy revealed Diffuse Large B-Cell Lymphoma, germinal center type  06/05/18 CT Neck revealed Pathologic RIGHT-sided level II, III, IV, and V lymphadenopathy, most consistent with metastatic carcinoma. An obvious primary source is not identified. Tissue sampling is warranted. 07/16/18 Hep B and Hep C negative 07/17/18 ECHO revealed LV EF of 60-65% 07/22/18 PET/CT revealed "Hypermetabolic adenopathy especially concentrated in the right neck but also in the left neck, chest, abdomen, and pelvis. The adenopathy is primarily Deauville 5 although some few scattered smaller lesions are Deauville 4. 2. Diffuse abnormal splenic activity, Deauville 5, without overt splenomegaly. 3. There is also hypermetabolic Deauville 5 activity in the mildly thickened distal appendix, and raising suspicion for involvement of the appendix. 4. Other imaging findings of potential clinical significance: Aortic Atherosclerosis. Coronary atherosclerosis." 10/03/2018 PET scan revealed "Interval response to therapy with stable to decreased size of lymph nodes on CT and generalized decrease in hypermetabolism of the abnormal nodes. Hypermetabolism in the lymph nodes today is compatible with a combination of Deauville 3 and Deauville 4 disease. Stable tiny bilateral pulmonary nodules. Increase radiotracer accumulation in the marrow space on today's study, presumably representing marrow stimulatory effects of therapy."   2. Currently with stage IV mantle cell lymphoma 02/23/2020 Right Cervical Lymph Node Surgical Pathology [phone]) which revealed "Non-Hodgkin B-cell lymphoma. Ki-67 generally shows low expression (<5-15%)." 02/23/2020 FISH Mutation Analysis which revealed "t(11;14): DETECTED". PLAN -No significant prohibitive toxicities or significant  bruising at this time. Grade 1 fatigue from acalabrutinib. Blood pressure stable Continue acalabrutinib 100 mg p.o. twice daily. Patient has significantly elevated LDH but this could be related to his transaminitis from liver injury related to his gallbladder cancer metastasis to the liver as well as Y90 treatment to the liver. We will get repeat PET CT scan in 6 weeks   3.  Normocytic normochromic anemia.  Likely multifactorial mantle cell lymphoma and metastatic gallbladder cancer plus a acalabrutinib Plan -We will continue to monitor with treatment. No overt bleeding noted.   4.  Thrombocytopenia likely due to mantle cell lymphoma and acalabrutinib. Platelet counts at 127k today. Continue to monitor   5.  Bilateral lower extremity edema echo showed normal ejection fraction Likely related to third spacing due to hypoalbuminemia.  Cannot rule out lymphedema due to lymphoma involving pelvic lymph nodes. Continue to optimize p.o. intake Started on low-dose Lasix 20 mg p.o. daily   5.  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  Metastatic to the liver with multiple liver lesions. -Pathology from his recent gallbladder surgery showed GALLBLADDER, CHOLECYSTECTOMY:  - Adenocarcinoma, 2.4 cm maximally, with perforation of serosa. Negative for carcinoma in one lymph node. Cholelithiasis.  CA 19-9 tumor marker continues to gradually increase which is concerning. Mild intermittent right upper quadrant discomfort   6. Anasarca  7. AKI  PLAN:  -Discussed with the patient and his wife that we need additional work-up with CT abdomen/pelvis to better evaluate his malignancy.  Given his abdominal distention and significant lower extremity edema, exam findings are concerning for progressive malignancy.  Further recommendations pending CT scan. -Labs from this morning have been reviewed.  His hemoglobin is down to 7.4, BUN 78, creatinine 2.01, calcium 7.4, total  protein 4.2, albumin 2.0, AST 56, alk phos 287. -Recommend close monitoring of his CBC and renal function. -Recommend PRBC transfusion for hemoglobin less  than 7.5.  Recommend platelet transfusion for platelet count less than 10,000 or active bleeding.  Okay for DVT prophylaxis with Lovenox as long as platelet count stays above 50,000. -Additional recommendations pending work-up  Mario Garcia  SUBJECTIVE: Mr. Shew is followed by our office for stage IV mantle cell lymphoma and history of gallbladder cancer with metastatic disease to the liver.  He presented to IR yesterday for Y90 treatment and due to significant lower extremity edema, he was referred to the emergency department for further evaluation.  Doppler ultrasound of the lower extremities was negative for DVT bilaterally.  He was given a dose of albumin.  This morning, he has ongoing lower extremity edema.  Lower extremity edema has been present for about 2 weeks and getting worse.  He is not having any chest pain or shortness of breath.  Reports a distended abdomen.  He is not having any nausea or vomiting.  However, he feels full drinking the oral contrast for the CT scan he will have later this morning.  Overall, he reports feeling very weak.  Oncology History  Diffuse large B-cell lymphoma of lymph nodes of neck (HCC)  07/26/2018 Initial Diagnosis   Diffuse large B-cell lymphoma of lymph nodes of neck ([place])   08/05/2018 - 11/20/2018 Chemotherapy   The patient had DOXOrubicin (ADRIAMYCIN) chemo injection 48 mg, 25 mg/m2 = 48 mg (100 % of original dose 25 mg/m2), Intravenous,  Once, 6 of 6 cycles Dose modification: 25 mg/m2 (original dose 25 mg/m2, Cycle 1, Reason: Patient Age), 37.5 mg/m2 (original dose 25 mg/m2, Cycle 4, Reason: Provider Judgment) Administration: 48 mg (08/05/2018), 48 mg (08/26/2018), 48 mg (09/16/2018), 72 mg (10/07/2018), 72 mg (10/28/2018), 72 mg (11/18/2018) palonosetron (ALOXI) injection 0.25 mg, 0.25 mg, Intravenous,   Once, 6 of 6 cycles Administration: 0.25 mg (08/05/2018), 0.25 mg (08/26/2018), 0.25 mg (09/16/2018), 0.25 mg (10/07/2018), 0.25 mg (10/28/2018), 0.25 mg (11/18/2018) pegfilgrastim-cbqv (UDENYCA) injection 6 mg, 6 mg, Subcutaneous, Once, 6 of 6 cycles Administration: 6 mg (08/07/2018), 6 mg (08/28/2018), 6 mg (09/18/2018), 6 mg (10/09/2018), 6 mg (10/30/2018), 6 mg (11/20/2018) vinCRIStine (ONCOVIN) 1 mg in sodium chloride 0.9 % 50 mL chemo infusion, 1 mg (100 % of original dose 1 mg), Intravenous,  Once, 6 of 6 cycles Dose modification: 1 mg (original dose 1 mg, Cycle 1, Reason: Patient Age) Administration: 1 mg (08/05/2018), 1 mg (08/26/2018), 1 mg (09/16/2018), 1 mg (10/07/2018), 1 mg (10/28/2018), 1 mg (11/18/2018) riTUXimab (RITUXAN) 700 mg in sodium chloride 0.9 % 250 mL (2.1875 mg/mL) infusion, 375 mg/m2 = 700 mg, Intravenous,  Once, 1 of 1 cycle Administration: 700 mg (08/05/2018) cyclophosphamide (CYTOXAN) 780 mg in sodium chloride 0.9 % 250 mL chemo infusion, 400 mg/m2 = 780 mg (100 % of original dose 400 mg/m2), Intravenous,  Once, 6 of 6 cycles Dose modification: 400 mg/m2 (original dose 400 mg/m2, Cycle 1, Reason: Patient Age), 500 mg/m2 (original dose 400 mg/m2, Cycle 5, Reason: Provider Judgment) Administration: 780 mg (08/05/2018), 780 mg (08/26/2018), 780 mg (09/16/2018), 780 mg (10/07/2018), 960 mg (10/28/2018), 960 mg (11/18/2018) riTUXimab (RITUXAN) 700 mg in sodium chloride 0.9 % 180 mL infusion, 375 mg/m2 = 700 mg, Intravenous,  Once, 5 of 5 cycles Administration: 700 mg (08/26/2018), 700 mg (09/16/2018), 700 mg (10/07/2018), 700 mg (10/28/2018), 700 mg (11/18/2018)  for chemotherapy treatment.    10/18/2020 -  Chemotherapy   Patient is on Treatment Plan : NON-HODGKINS LYMPHOMA Rituximab q60d Maintenance     Mantle cell lymphoma ([place])  03/16/2020 Initial  Diagnosis   Mantle cell lymphoma ([place])   03/25/2020 - 07/21/2020 Chemotherapy            REVIEW OF SYSTEMS:   Review of Systems  Constitutional:   Positive for weight loss. Negative for chills and fever.  Respiratory:  Negative for shortness of breath.   Cardiovascular:  Positive for leg swelling. Negative for chest pain.  Gastrointestinal:  Negative for nausea and vomiting.       Reports having a distended abdomen.  Skin: Negative.   Neurological: Negative.   All other systems reviewed and are negative.   I have reviewed the past medical history, past surgical history, social history and family history with the patient and they are unchanged from previous note.   PHYSICAL EXAMINATION: ECOG PERFORMANCE STATUS: 2 - Symptomatic, <50% confined to bed  Vitals:   09/02/21 0544 09/02/21 0800  BP: (!) 106/53   Pulse: 89   Resp: 18   Temp: 98.7 F (37.1 C)   SpO2: (!) 87% 92%   Filed Weights   09/01/21 1055  Weight: 68.9 kg    Intake/Output from previous day: 06/08 0701 - 06/09 0700 In: 240 [P.O.:240] Out: 400 [Urine:400]  Physical Exam Vitals reviewed.  Constitutional:      General: He is not in acute distress. Eyes:     General: No scleral icterus. Cardiovascular:     Rate and Rhythm: Normal rate and regular rhythm.     Comments: Pitting edema to the bilateral lower extremities from the ankles up to the thighs Pulmonary:     Effort: Pulmonary effort is normal. No respiratory distress.  Abdominal:     General: There is distension.     Tenderness: There is no abdominal tenderness.  Skin:    General: Skin is warm and dry.  Neurological:     Mental Status: He is alert and oriented to person, place, and time.     LABORATORY DATA:  I have reviewed the data as listed    Latest Ref Rng & Units 09/02/2021    3:16 AM 09/01/2021    8:11 AM 08/10/2021    9:05 AM  CMP  Glucose 70 - 99 mg/dL 78  83  82   BUN 8 - 23 mg/dL 78  75  21   Creatinine 0.61 - 1.24 mg/dL 2.01  1.93  0.80   Sodium 135 - 145 mmol/L 141  143  141   Potassium 3.5 - 5.1 mmol/L 3.5  3.5  3.8   Chloride 98 - 111 mmol/L 101  102  100   CO2 22 - 32  mmol/L _0 Calcium 8.9 - 10.3 mg/dL 7.4  7.7  8.2   Total Protein 6.5 - 8.1 g/dL 4.2  4.5  5.4   Total Bilirubin 0.3 - 1.2 mg/dL 0.9  1.0  0.8   Alkaline Phos 38 - 126 U/L 287  305  373   AST 15 - 41 U/L 56  55  55   ALT 0 - 44 U/L _1 Lab Results  Component Value Date   WBC 12.8 (H) 09/01/2021   HGB 7.4 (L) 09/02/2021   HCT 24.9 (L) 09/02/2021   MCV 102.3 (H) 09/01/2021   PLT 88 (L) 09/01/2021   NEUTROABS 5.1 09/01/2021    Lab Results  Component Value Date   CAN199 1,839 (H) 08/10/2021   PSA1 <0.1 11/16/2017    US RENAL  Result Date:  09/01/2021 CLINICAL DATA:  Acute kidney injury. EXAM: RENAL / URINARY TRACT ULTRASOUND COMPLETE COMPARISON:  None Available. FINDINGS: Right Kidney: Renal measurements: 10.8 cm x 5.1 cm x 5.2 cm = volume: 149.4 mL. Echogenicity within normal limits. No mass or hydronephrosis visualized. Left Kidney: Renal measurements: 11.4 cm x 4.1 cm x 4.6 cm = volume: 113.6 mL. Echogenicity within normal limits. No mass or hydronephrosis visualized. Bladder: Appears normal for degree of bladder distention. Other: Of incidental note is the presence of a moderate amount of ascites. IMPRESSION: 1. Ascites. 2. Normal ultrasonographic appearance of the kidneys and urinary bladder. Electronically Signed   By: Virgina Norfolk M.D.   On: 09/01/2021 20:39   VAS [place] LOWER EXTREMITY VENOUS (DVT) (ONLY [facility] & [facility])  Result Date: 09/01/2021  Lower Venous DVT Study Patient Name:  FISHER HARGADON  Date of Exam:   09/01/2021 Medical Rec #: [id number]      Accession #:    [id number] Date of Birth: Aug 01, 1931      Patient Gender: M Patient Age:   4 years Exam Location:  [facility] Procedure:      VAS [place] LOWER EXTREMITY VENOUS (DVT) Referring Phys: DAN FLOYD --------------------------------------------------------------------------------  Indications: Edema.  Risk Factors: CA patient. Limitations: Poor ultrasound/tissue interface. Comparison Study: No previous exams  Performing Technologist: Jody Hill RVT, RDMS  Examination Guidelines: A complete evaluation includes B-mode imaging, spectral Doppler, color Doppler, and power Doppler as needed of all accessible portions of each vessel. Bilateral testing is considered an integral part of a complete examination. Limited examinations for reoccurring indications may be performed as noted. The reflux portion of the exam is performed with the patient in reverse Trendelenburg.  +---------+---------------+---------+-----------+----------+-------------------+ RIGHT    CompressibilityPhasicitySpontaneityPropertiesThrombus Aging      +---------+---------------+---------+-----------+----------+-------------------+ CFV      Full           Yes      Yes                                      +---------+---------------+---------+-----------+----------+-------------------+ SFJ      Full                                                             +---------+---------------+---------+-----------+----------+-------------------+ FV Prox  Full           Yes      Yes                                      +---------+---------------+---------+-----------+----------+-------------------+ FV Mid   Full           Yes      Yes                                      +---------+---------------+---------+-----------+----------+-------------------+ FV DistalFull           Yes      Yes                                      +---------+---------------+---------+-----------+----------+-------------------+  PFV      Full                                                             +---------+---------------+---------+-----------+----------+-------------------+ POP      Full           Yes      Yes                                      +---------+---------------+---------+-----------+----------+-------------------+ PTV                                                   Not well visualized  +---------+---------------+---------+-----------+----------+-------------------+ PERO     Full                                                             +---------+---------------+---------+-----------+----------+-------------------+   +---------+---------------+---------+-----------+----------+--------------+ LEFT     CompressibilityPhasicitySpontaneityPropertiesThrombus Aging +---------+---------------+---------+-----------+----------+--------------+ CFV      Full           Yes      Yes                                 +---------+---------------+---------+-----------+----------+--------------+ SFJ      Full                                                        +---------+---------------+---------+-----------+----------+--------------+ FV Prox  Full           Yes      Yes                                 +---------+---------------+---------+-----------+----------+--------------+ FV Mid   Full           Yes      Yes                                 +---------+---------------+---------+-----------+----------+--------------+ FV DistalFull           Yes      Yes                                 +---------+---------------+---------+-----------+----------+--------------+ PFV      Full                                                        +---------+---------------+---------+-----------+----------+--------------+  POP      Full           Yes      Yes                                 +---------+---------------+---------+-----------+----------+--------------+ PTV      Full                                                        +---------+---------------+---------+-----------+----------+--------------+ PERO     Full                                                        +---------+---------------+---------+-----------+----------+--------------+     Summary: RIGHT: - There is no evidence of deep vein thrombosis in the lower extremity.  - No cystic  structure found in the popliteal fossa. - Diffuse subcutaneous edema to entire extremity - Ultrasound characteristics of enlarged lymph nodes are noted in the groin.  LEFT: - There is no evidence of deep vein thrombosis in the lower extremity.  - No cystic structure found in the popliteal fossa. - Diffuse subcutaneous edema to entire extremity - Ultrasound characteristics of enlarged lymph nodes noted in the groin.  *See table(s) above for measurements and observations. Electronically signed by Monica Martinez MD on 09/01/2021 at 4:46:09 PM.    Final    DG CHEST PORT 1 VIEW  Result Date: 09/01/2021 CLINICAL DATA:  Bilateral leg swelling. EXAM: PORTABLE CHEST 1 VIEW COMPARISON:  Chest radiograph 07/27/2021 FINDINGS: Left jugular Port-A-Cath with the tip near the superior cavoatrial junction. Port-A-Cath tip is stable. New hazy densities in both lungs particularly at the right lung base. Probable pleural effusions, right side greater than left. Enlarged interstitial lung markings. Nodular densities in the lungs could represent enlarged vascular structures. Difficult to exclude pulmonary nodules. Heart size is grossly within normal limits. IMPRESSION: 1. Bilateral hazy lung densities with probable pleural effusions, right side greater than left. Findings are concerning for pulmonary edema. 2. Subtle nodular densities particularly in the left lung. These could represent vascular structures but difficult to exclude pulmonary nodules. Recommend attention on follow-up imaging and/or further evaluation with chest CT. Electronically Signed   By: Markus Daft M.D.   On: 09/01/2021 14:49   NM Radio Pharm Therapy Intraarterial  Result Date: 08/05/2021 CLINICAL DATA:  Gallbladder carcinoma with liver metastasis. Microsphere radio therapy to the RIGHT hepatic lobe. EXAM: NUCLEAR MEDICINE SPECIAL MED RAD PHYSICS CONS; NUCLEAR MEDICINE RADIO PHARM THERAPY INTRA ARTERIAL; NUCLEAR MEDICINE TREATMENT PROCEDURE; NUCLEAR MEDICINE  LIVER SCAN TECHNIQUE: In conjunction with the interventional radiologist a Y- Microsphere dose was calculated utilizing body surface area formulation as well as partition formulation. Post processing volume metrics performed. Calculated dose equal 28.39 mCi. Pre therapy MAA liver SPECT scan and CTA were evaluated. Utilizing a microcatheter system, the hepatic artery was selected and Y-90 microspheres were delivered in fractionated aliquots. Radiopharmaceutical was delivered by the interventional radiologist and nuclear radiologist. The patient tolerated procedure well. No adverse effects were noted. Bremsstrahlung planar and SPECT imaging of the abdomen following intrahepatic arterial delivery of Y-90 microsphere was  performed. RADIOPHARMACEUTICALS:  78.2 millicuries yttrium 90 microspheres COMPARISON:  MAA scan 07/28/2021, MRI 05/25/2021 FINDINGS: Y - 90 microspheres therapy as above. First therapy the right hepatic lobe. Bremsstrahlung planar and SPECT imaging of the abdomen following intrahepatic arterial delivery of Y-52mcrosphere demonstrates radioactivity localized to the RIGHT hepatic lobe. No evidence of extrahepatic activity. IMPRESSION: Successful Y - 90 microsphere delivery for treatment of unresectable liver metastasis. First therapy to the RIGHT lobe. Bremssstrahlung scan demonstrates activity localized to RIGHT hepatic lobe with no extrahepatic activity identified. Electronically Signed   By: SSuzy BouchardM.D.   On: 08/05/2021 14:08   NM Special Med Rad Physics Cons  Result Date: 08/05/2021 CLINICAL DATA:  Gallbladder carcinoma with liver metastasis. Microsphere radio therapy to the RIGHT hepatic lobe. EXAM: NUCLEAR MEDICINE SPECIAL MED RAD PHYSICS CONS; NUCLEAR MEDICINE RADIO PHARM THERAPY INTRA ARTERIAL; NUCLEAR MEDICINE TREATMENT PROCEDURE; NUCLEAR MEDICINE LIVER SCAN TECHNIQUE: In conjunction with the interventional radiologist a Y- Microsphere dose was calculated utilizing body surface  area formulation as well as partition formulation. Post processing volume metrics performed. Calculated dose equal 28.39 mCi. Pre therapy MAA liver SPECT scan and CTA were evaluated. Utilizing a microcatheter system, the hepatic artery was selected and Y-90 microspheres were delivered in fractionated aliquots. Radiopharmaceutical was delivered by the interventional radiologist and nuclear radiologist. The patient tolerated procedure well. No adverse effects were noted. Bremsstrahlung planar and SPECT imaging of the abdomen following intrahepatic arterial delivery of Y-90 microsphere was performed. RADIOPHARMACEUTICALS:  295.6millicuries yttrium 90 microspheres COMPARISON:  MAA scan 07/28/2021, MRI 05/25/2021 FINDINGS: Y - 90 microspheres therapy as above. First therapy the right hepatic lobe. Bremsstrahlung planar and SPECT imaging of the abdomen following intrahepatic arterial delivery of [id number] demonstrates radioactivity localized to the RIGHT hepatic lobe. No evidence of extrahepatic activity. IMPRESSION: Successful Y - 90 microsphere delivery for treatment of unresectable liver metastasis. First therapy to the RIGHT lobe. Bremssstrahlung scan demonstrates activity localized to RIGHT hepatic lobe with no extrahepatic activity identified. Electronically Signed   By: StSuzy Bouchard.D.   On: 08/05/2021 14:08   NM Special Treatment Procedure  Result Date: 08/05/2021 CLINICAL DATA:  Gallbladder carcinoma with liver metastasis. Microsphere radio therapy to the RIGHT hepatic lobe. EXAM: NUCLEAR MEDICINE SPECIAL MED RAD PHYSICS CONS; NUCLEAR MEDICINE RADIO PHARM THERAPY INTRA ARTERIAL; NUCLEAR MEDICINE TREATMENT PROCEDURE; NUCLEAR MEDICINE LIVER SCAN TECHNIQUE: In conjunction with the interventional radiologist a Y- Microsphere dose was calculated utilizing body surface area formulation as well as partition formulation. Post processing volume metrics performed. Calculated dose equal 28.39 mCi. Pre  therapy MAA liver SPECT scan and CTA were evaluated. Utilizing a microcatheter system, the hepatic artery was selected and Y-90 microspheres were delivered in fractionated aliquots. Radiopharmaceutical was delivered by the interventional radiologist and nuclear radiologist. The patient tolerated procedure well. No adverse effects were noted. Bremsstrahlung planar and SPECT imaging of the abdomen following intrahepatic arterial delivery of Y-90 microsphere was performed. RADIOPHARMACEUTICALS:  2721.3illicuries yttrium 90 microspheres COMPARISON:  MAA scan 07/28/2021, MRI 05/25/2021 FINDINGS: Y - 90 microspheres therapy as above. First therapy the right hepatic lobe. Bremsstrahlung planar and SPECT imaging of the abdomen following intrahepatic arterial delivery of [id number] demonstrates radioactivity localized to the RIGHT hepatic lobe. No evidence of extrahepatic activity. IMPRESSION: Successful Y - 90 microsphere delivery for treatment of unresectable liver metastasis. First therapy to the RIGHT lobe. Bremssstrahlung scan demonstrates activity localized to RIGHT hepatic lobe with no extrahepatic activity identified. Electronically Signed   By: SteHelane Gunther  On: 08/05/2021 14:08   NM LIVER TUMOR LOC IMFLAM SPECT 1 DAY  Result Date: 08/05/2021 CLINICAL DATA:  Gallbladder carcinoma with liver metastasis. Microsphere radio therapy to the RIGHT hepatic lobe. EXAM: NUCLEAR MEDICINE SPECIAL MED RAD PHYSICS CONS; NUCLEAR MEDICINE RADIO PHARM THERAPY INTRA ARTERIAL; NUCLEAR MEDICINE TREATMENT PROCEDURE; NUCLEAR MEDICINE LIVER SCAN TECHNIQUE: In conjunction with the interventional radiologist a Y- Microsphere dose was calculated utilizing body surface area formulation as well as partition formulation. Post processing volume metrics performed. Calculated dose equal 28.39 mCi. Pre therapy MAA liver SPECT scan and CTA were evaluated. Utilizing a microcatheter system, the hepatic artery was selected and Y-90  microspheres were delivered in fractionated aliquots. Radiopharmaceutical was delivered by the interventional radiologist and nuclear radiologist. The patient tolerated procedure well. No adverse effects were noted. Bremsstrahlung planar and SPECT imaging of the abdomen following intrahepatic arterial delivery of Y-90 microsphere was performed. RADIOPHARMACEUTICALS:  67.1 millicuries yttrium 90 microspheres COMPARISON:  MAA scan 07/28/2021, MRI 05/25/2021 FINDINGS: Y - 90 microspheres therapy as above. First therapy the right hepatic lobe. Bremsstrahlung planar and SPECT imaging of the abdomen following intrahepatic arterial delivery of Y-24microsphere demonstrates radioactivity localized to the RIGHT hepatic lobe. No evidence of extrahepatic activity. IMPRESSION: Successful Y - 90 microsphere delivery for treatment of unresectable liver metastasis. First therapy to the RIGHT lobe. [place] scan demonstrates activity localized to RIGHT hepatic lobe with no extrahepatic activity identified. Electronically Signed   By: Suzy Bouchard M.D.   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 EXAM: 1. ULTRASOUND GUIDANCE FOR VASCULAR ACCESS OF THE RIGHT COMMON FEMORAL ARTERY 2. VISCERAL SELECTIVE ARTERIOGRAPHY OF THE CELIAC AXIS 3. SELECTIVE ARTERIOGRAPHY AT THE LEVEL OF THE COMMON HEPATIC ARTERY 4. SELECTIVE ARTERIOGRAPHY AT THE LEVEL OF THE RIGHT HEPATIC ARTERY 5. TRANSCATHETER YTTRIUM-90 RADIOEMBOLIZATION OF THE RIGHT HEPATIC ARTERY FLUOROSCOPY: 12 minutes and 36 seconds.  174 mGy. MEDICATIONS AND MEDICAL HISTORY: Additional Medications: 500 mg IV Flagyl, 500 mg IV Levaquin, 8 mg IV Decadron, 40 mg IV Protonix, 8 mg IV Zofran Y-90 dose: 27 mCi ANESTHESIA/SEDATION: Moderate (conscious) sedation was employed  during this procedure. A total of Versed 2.0 mg and Fentanyl 100 mcg was administered intravenously by radiology nursing. Moderate Sedation Time: 51 minutes. The patient's level of consciousness and vital signs were monitored continuously by radiology nursing throughout the procedure under my direct supervision. CONTRAST:  35 mL Omnipaque-300 PROCEDURE: The procedure, risks, benefits, and alternatives were explained to the patient. Questions regarding the procedure were encouraged and answered. The patient understands and consents to the procedure. A time-out was performed prior to initiating the procedure. The left groin was prepped with chlorhexidine in a sterile fashion, and a sterile drape was applied covering the operative field. A sterile gown and sterile gloves were used for the procedure. Local anesthesia was provided with 1% Lidocaine. Ultrasound image documentation was performed. Ultrasound was performed of both right and left common femoral arteries. The left was chosen for access. A permanent ultrasound image was recorded to document patency and level of access. Access of the left common femoral artery was performed under ultrasound guidance with a micropuncture set. A 5-French sheath was placed. A 5-French Cobra catheter was advanced and used to selectively catheterize the celiac axis. Selective arteriography of the celiac axis was performed. The 5 French catheter was further  advanced over a hydrophilic guidewire into the common hepatic artery and selective arteriography performed at the level of the common hepatic artery. A microcatheter was used to selectively catheterize the right hepatic artery. Selective right hepatic arteriography was performed. Radioembolization was performed with Yttrium-90 SIR Spheres. Particles were administered via a microcatheter utilizing a completely enclosed system. Monitoring of antegrade flow was performed during administration under fluoroscopy with use of contrast  intermittently. After administration of the first dose of particles, the microcatheter was removed and discarded along with the attached tubing and particle vial. Arteriotomy closure was performed with the Cordis ExoSeal device. FINDINGS: Access for treatment was performed at the level of the left common femoral artery due to small residual hematoma overlying the right common femoral artery as well as ecchymosis from the prior arteriogram in the right groin region. Arteriography was necessary prior to radioembolization to confirm patency of the celiac axis, branch vessels and right hepatic artery as well as plan exact level of treatment. There was no significant change in flow pattern compared to the prior mapping study performed on 07/28/2021. There is some antegrade flow in the gastroduodenal artery with common hepatic arteriography. Previous arteriography demonstrated predominantly retrograde flow in the GDA trunk. GDA flow may need to be reassessed and occluded prior to potential future left hepatic artery radioembolization. The entire dose of yttrium 90 microspheres was able to be delivered in the right hepatic artery while maintaining antegrade flow. COMPLICATIONS: None IMPRESSION: Hepatic arterial radioembolization performed with Yttrium-90 microspheres. Right hepatic artery treatment was performed today with administration of 27 millicuries of Y-78 activity. Electronically Signed   By: Aletta Edouard M.D.   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 EXAM: 1. ULTRASOUND GUIDANCE FOR VASCULAR ACCESS OF THE RIGHT COMMON FEMORAL ARTERY 2. VISCERAL SELECTIVE ARTERIOGRAPHY OF THE CELIAC AXIS 3. SELECTIVE ARTERIOGRAPHY AT THE LEVEL OF THE COMMON HEPATIC ARTERY 4. SELECTIVE ARTERIOGRAPHY AT THE LEVEL OF THE RIGHT HEPATIC ARTERY 5.  TRANSCATHETER YTTRIUM-90 RADIOEMBOLIZATION OF THE RIGHT HEPATIC ARTERY FLUOROSCOPY: 12 minutes and 36 seconds.  174 mGy. MEDICATIONS AND MEDICAL HISTORY: Additional Medications: 500 mg IV Flagyl, 500 mg IV Levaquin, 8 mg IV Decadron, 40 mg IV Protonix, 8 mg IV Zofran Y-90 dose: 27 mCi ANESTHESIA/SEDATION: Moderate (conscious) sedation was employed during this procedure. A total of Versed 2.0 mg and Fentanyl 100 mcg was administered intravenously by radiology nursing. Moderate Sedation Time: 51 minutes. The patient's level of consciousness and vital signs were monitored continuously by radiology nursing throughout the procedure under my direct supervision. CONTRAST:  35 mL Omnipaque-300 PROCEDURE: The procedure, risks, benefits, and alternatives were explained to the patient. Questions regarding the procedure were encouraged and answered. The patient understands and consents to the procedure. A time-out was performed prior to initiating the procedure. The left groin was prepped with chlorhexidine in a sterile fashion, and a sterile drape was applied covering the operative field. A sterile gown and sterile gloves were used for the procedure. Local anesthesia was provided with 1% Lidocaine. Ultrasound image documentation was performed. Ultrasound was performed of both right and left common femoral arteries. The left was chosen for access. A permanent ultrasound image was recorded to document patency and level of access. Access of the left common femoral artery was performed under ultrasound guidance with a micropuncture set. A 5-French sheath was placed.  A 5-French Cobra catheter was advanced and used to selectively catheterize the celiac axis. Selective arteriography of the celiac axis was performed. The 5 French catheter was further advanced over a hydrophilic guidewire into the common hepatic artery and selective arteriography performed at the level of the common hepatic artery. A microcatheter was used to  selectively catheterize the right hepatic artery. Selective right hepatic arteriography was performed. Radioembolization was performed with Yttrium-90 SIR Spheres. Particles were administered via a microcatheter utilizing a completely enclosed system. Monitoring of antegrade flow was performed during administration under fluoroscopy with use of contrast intermittently. After administration of the first dose of particles, the microcatheter was removed and discarded along with the attached tubing and particle vial. Arteriotomy closure was performed with the Cordis ExoSeal device. FINDINGS: Access for treatment was performed at the level of the left common femoral artery due to small residual hematoma overlying the right common femoral artery as well as ecchymosis from the prior arteriogram in the right groin region. Arteriography was necessary prior to radioembolization to confirm patency of the celiac axis, branch vessels and right hepatic artery as well as plan exact level of treatment. There was no significant change in flow pattern compared to the prior mapping study performed on 07/28/2021. There is some antegrade flow in the gastroduodenal artery with common hepatic arteriography. Previous arteriography demonstrated predominantly retrograde flow in the GDA trunk. GDA flow may need to be reassessed and occluded prior to potential future left hepatic artery radioembolization. The entire dose of yttrium 90 microspheres was able to be delivered in the right hepatic artery while maintaining antegrade flow. COMPLICATIONS: None IMPRESSION: Hepatic arterial radioembolization performed with Yttrium-90 microspheres. Right hepatic artery treatment was performed today with administration of 27 millicuries of Z-61 activity. Electronically Signed   By: Aletta Edouard M.D.   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 EXAM: 1. ULTRASOUND GUIDANCE FOR VASCULAR ACCESS OF THE RIGHT COMMON FEMORAL ARTERY 2. VISCERAL SELECTIVE ARTERIOGRAPHY OF THE CELIAC AXIS 3. SELECTIVE ARTERIOGRAPHY AT THE LEVEL OF THE COMMON HEPATIC ARTERY 4. SELECTIVE ARTERIOGRAPHY AT THE LEVEL OF THE RIGHT HEPATIC ARTERY 5. TRANSCATHETER YTTRIUM-90 RADIOEMBOLIZATION OF THE RIGHT HEPATIC ARTERY FLUOROSCOPY: 12 minutes and 36 seconds.  174 mGy. MEDICATIONS AND MEDICAL HISTORY: Additional Medications: 500 mg IV Flagyl, 500 mg IV Levaquin, 8 mg IV Decadron, 40 mg IV Protonix, 8 mg IV Zofran Y-90 dose: 27 mCi ANESTHESIA/SEDATION: Moderate (conscious) sedation was employed during this procedure. A total of Versed 2.0 mg and Fentanyl 100 mcg was administered intravenously by radiology nursing. Moderate Sedation Time: 51 minutes. The patient's level of consciousness and vital signs were monitored continuously by radiology nursing throughout the procedure under my direct supervision. CONTRAST:  35 mL Omnipaque-300 PROCEDURE: The procedure, risks, benefits, and alternatives were explained to the patient. Questions regarding the procedure were encouraged and answered. The patient understands and consents to the procedure. A time-out was performed prior to initiating the procedure. The left groin was prepped with chlorhexidine in a sterile fashion, and a sterile drape was applied covering the operative field. A sterile gown and sterile gloves were used for the procedure. Local anesthesia was provided with 1% Lidocaine. Ultrasound image documentation was performed. Ultrasound was performed of both right and left common femoral arteries. The left was chosen for access. A permanent ultrasound image was recorded to document  patency and level of access. Access of the left common femoral artery was performed under ultrasound guidance with a micropuncture set. A 5-French sheath was placed. A 5-French  Cobra catheter was advanced and used to selectively catheterize the celiac axis. Selective arteriography of the celiac axis was performed. The 5 French catheter was further advanced over a hydrophilic guidewire into the common hepatic artery and selective arteriography performed at the level of the common hepatic artery. A microcatheter was used to selectively catheterize the right hepatic artery. Selective right hepatic arteriography was performed. Radioembolization was performed with Yttrium-90 SIR Spheres. Particles were administered via a microcatheter utilizing a completely enclosed system. Monitoring of antegrade flow was performed during administration under fluoroscopy with use of contrast intermittently. After administration of the first dose of particles, the microcatheter was removed and discarded along with the attached tubing and particle vial. Arteriotomy closure was performed with the Cordis ExoSeal device. FINDINGS: Access for treatment was performed at the level of the left common femoral artery due to small residual hematoma overlying the right common femoral artery as well as ecchymosis from the prior arteriogram in the right groin region. Arteriography was necessary prior to radioembolization to confirm patency of the celiac axis, branch vessels and right hepatic artery as well as plan exact level of treatment. There was no significant change in flow pattern compared to the prior mapping study performed on 07/28/2021. There is some antegrade flow in the gastroduodenal artery with common hepatic arteriography. Previous arteriography demonstrated predominantly retrograde flow in the GDA trunk. GDA flow may need to be reassessed and occluded prior to potential future left hepatic artery radioembolization. The entire dose of yttrium 90 microspheres was able to be delivered in the right hepatic artery while maintaining antegrade flow. COMPLICATIONS: None IMPRESSION: Hepatic arterial radioembolization  performed with Yttrium-90 microspheres. Right hepatic artery treatment was performed today with administration of 27 millicuries of Z-32 activity. Electronically Signed   By: Aletta Edouard M.D.   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 EXAM: 1. ULTRASOUND GUIDANCE FOR VASCULAR ACCESS OF THE RIGHT COMMON FEMORAL ARTERY 2. VISCERAL SELECTIVE ARTERIOGRAPHY OF THE CELIAC AXIS 3. SELECTIVE ARTERIOGRAPHY AT THE LEVEL OF THE COMMON HEPATIC ARTERY 4. SELECTIVE ARTERIOGRAPHY AT THE LEVEL OF THE RIGHT HEPATIC ARTERY 5. TRANSCATHETER YTTRIUM-90 RADIOEMBOLIZATION OF THE RIGHT HEPATIC ARTERY FLUOROSCOPY: 12 minutes and 36 seconds.  174 mGy. MEDICATIONS AND MEDICAL HISTORY: Additional Medications: 500 mg IV Flagyl, 500 mg IV Levaquin, 8 mg IV Decadron, 40 mg IV Protonix, 8 mg IV Zofran Y-90 dose: 27 mCi ANESTHESIA/SEDATION: Moderate (conscious) sedation was employed during this procedure. A total of Versed 2.0 mg and Fentanyl 100 mcg was administered intravenously by radiology nursing. Moderate Sedation Time: 51 minutes. The patient's level of consciousness and vital signs were monitored continuously by radiology nursing throughout the procedure under my direct supervision. CONTRAST:  35 mL Omnipaque-300 PROCEDURE: The procedure, risks, benefits, and alternatives were explained to the patient. Questions regarding the procedure were encouraged and answered. The patient understands and consents to the procedure. A time-out was performed prior to initiating the procedure. The left groin was prepped with chlorhexidine in a sterile fashion, and a sterile drape was applied covering the operative field. A sterile gown and sterile gloves were used for the procedure. Local anesthesia was provided with 1% Lidocaine. Ultrasound image  documentation was performed. Ultrasound was performed of both right and left common femoral arteries. The left was chosen for access. A permanent ultrasound image was recorded to document patency and level of access. Access of the left common femoral artery was performed under ultrasound guidance with a micropuncture set. A 5-French sheath was placed. A 5-French Cobra catheter was advanced and used to selectively catheterize the celiac axis. Selective arteriography of the celiac axis was performed. The 5 French catheter was further advanced over a hydrophilic guidewire into the common hepatic artery and selective arteriography performed at the level of the common hepatic artery. A microcatheter was used to selectively catheterize the right hepatic artery. Selective right hepatic arteriography was performed. Radioembolization was performed with Yttrium-90 SIR Spheres. Particles were administered via a microcatheter utilizing a completely enclosed system. Monitoring of antegrade flow was performed during administration under fluoroscopy with use of contrast intermittently. After administration of the first dose of particles, the microcatheter was removed and discarded along with the attached tubing and particle vial. Arteriotomy closure was performed with the Cordis ExoSeal device. FINDINGS: Access for treatment was performed at the level of the left common femoral artery due to small residual hematoma overlying the right common femoral artery as well as ecchymosis from the prior arteriogram in the right groin region. Arteriography was necessary prior to radioembolization to confirm patency of the celiac axis, branch vessels and right hepatic artery as well as plan exact level of treatment. There was no significant change in flow pattern compared to the prior mapping study performed on 07/28/2021. There is some antegrade flow in the gastroduodenal artery with common hepatic arteriography. Previous arteriography  demonstrated predominantly retrograde flow in the GDA trunk. GDA flow may need to be reassessed and occluded prior to potential future left hepatic artery radioembolization. The entire dose of yttrium 90 microspheres was able to be delivered in the right hepatic artery while maintaining antegrade flow. COMPLICATIONS: None IMPRESSION: Hepatic arterial radioembolization performed with Yttrium-90 microspheres. Right hepatic artery treatment was performed today with administration of 27 millicuries of W-62 activity. Electronically Signed   By: Aletta Edouard M.D.   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 EXAM: 1. ULTRASOUND GUIDANCE FOR VASCULAR ACCESS OF THE RIGHT COMMON FEMORAL ARTERY 2. VISCERAL SELECTIVE ARTERIOGRAPHY OF THE CELIAC AXIS 3. SELECTIVE ARTERIOGRAPHY AT THE LEVEL OF THE COMMON HEPATIC ARTERY 4. SELECTIVE ARTERIOGRAPHY AT THE LEVEL OF THE RIGHT HEPATIC ARTERY 5. TRANSCATHETER YTTRIUM-90 RADIOEMBOLIZATION OF THE RIGHT HEPATIC ARTERY FLUOROSCOPY: 12 minutes and 36 seconds.  174 mGy. MEDICATIONS AND MEDICAL HISTORY: Additional Medications: 500 mg IV Flagyl, 500 mg IV Levaquin, 8 mg IV Decadron, 40 mg IV Protonix, 8 mg IV Zofran Y-90 dose: 27 mCi ANESTHESIA/SEDATION: Moderate (conscious) sedation was employed during this procedure. A total of Versed 2.0 mg and Fentanyl 100 mcg was administered intravenously by radiology nursing. Moderate Sedation Time: 51 minutes. The patient's level of consciousness and vital signs were monitored continuously by radiology nursing throughout the procedure under my direct supervision. CONTRAST:  35 mL Omnipaque-300 PROCEDURE: The procedure, risks, benefits, and alternatives were explained to the patient. Questions regarding the procedure were encouraged and answered. The  patient understands and consents to the procedure. A time-out was performed prior to initiating the procedure. The left groin was prepped with chlorhexidine in a sterile fashion, and a  sterile drape was applied covering the operative field. A sterile gown and sterile gloves were used for the procedure. Local anesthesia was provided with 1% Lidocaine. Ultrasound image documentation was performed. Ultrasound was performed of both right and left common femoral arteries. The left was chosen for access. A permanent ultrasound image was recorded to document patency and level of access. Access of the left common femoral artery was performed under ultrasound guidance with a micropuncture set. A 5-French sheath was placed. A 5-French Cobra catheter was advanced and used to selectively catheterize the celiac axis. Selective arteriography of the celiac axis was performed. The 5 French catheter was further advanced over a hydrophilic guidewire into the common hepatic artery and selective arteriography performed at the level of the common hepatic artery. A microcatheter was used to selectively catheterize the right hepatic artery. Selective right hepatic arteriography was performed. Radioembolization was performed with Yttrium-90 SIR Spheres. Particles were administered via a microcatheter utilizing a completely enclosed system. Monitoring of antegrade flow was performed during administration under fluoroscopy with use of contrast intermittently. After administration of the first dose of particles, the microcatheter was removed and discarded along with the attached tubing and particle vial. Arteriotomy closure was performed with the Cordis ExoSeal device. FINDINGS: Access for treatment was performed at the level of the left common femoral artery due to small residual hematoma overlying the right common femoral artery as well as ecchymosis from the prior arteriogram in the right groin region. Arteriography was necessary prior to  radioembolization to confirm patency of the celiac axis, branch vessels and right hepatic artery as well as plan exact level of treatment. There was no significant change in flow pattern compared to the prior mapping study performed on 07/28/2021. There is some antegrade flow in the gastroduodenal artery with common hepatic arteriography. Previous arteriography demonstrated predominantly retrograde flow in the GDA trunk. GDA flow may need to be reassessed and occluded prior to potential future left hepatic artery radioembolization. The entire dose of yttrium 90 microspheres was able to be delivered in the right hepatic artery while maintaining antegrade flow. COMPLICATIONS: None IMPRESSION: Hepatic arterial radioembolization performed with Yttrium-90 microspheres. Right hepatic artery treatment was performed today with administration of 27 millicuries of J-49 activity. Electronically Signed   By: Aletta Edouard M.D.   On: 08/04/2021 12:10     Future Appointments  Date Time Provider [place]  Oct 05, 2021  7:00 AM [facility] PET CT 1 [facility] [place]  10/07/2021  8:30 AM [facility] None  10/07/2021  9:00 AM Brunetta Genera, MD [facility] None  03/10/2022  8:30 AM Denita Lung, MD [facility]      LOS: 1 day   ADDENDUM  .Patient was Personally and independently interviewed, examined and relevant elements of the history of present illness were reviewed in details and an assessment and plan was created. All elements of the patient's history of present illness , assessment and plan were discussed in details with Mario Bussing DNP. The above documentation reflects our combined findings assessment and plan.   Patient has developed significant anasarca and abdominal distention as well as lower extremity and scrotal swelling since he was last seen in clinic.  His Y90 treatment was held by interventional radiology. Ultrasound lower extremities show no evidence of DVT  At her  request a CT chest abdomen pelvis without contrast was done today which showedIMPRESSION: 1. Innumerable new and enlarging liver masses compatible with diffuse hepatic metastatic disease. 2. Innumerable new scattered pulmonary nodules,  some irregular and some well-defined. Possibilities include metastatic disease or infection. The nodules do not appear cavitary. 3. Extensive third spacing of fluid with large right and small left pleural effusions, diffuse subcutaneous and mesenteric edema, and substantial ascites. Portions of the ascites are complex with higher density dependent components, and hemoperitoneum is not excluded. 4. Mild improvement in thoracic adenopathy compared to prior. Roughly similar abdominopelvic adenopathy compared to prior. 5. Low-density blood pool compatible with anemia. 6. Newly indistinctly marginated hypodensity of the upper pole of the right thyroid with some expansion, possibly from tumor or inflammation. This is a new finding. 7. Other imaging findings of potential clinical significance: Aortic Atherosclerosis (ICD10-I70.0). Coronary atherosclerosis. Mild cardiomegaly. Splenomegaly. Lower lumbar impingement.   I discussed that he has extensive progression of likely his gallbladder adenocarcinoma metastases which is now compromising his liver function.  He has been doing nutritionally very poorly with limited p.o. intake.  Also has developed new pulmonary metastases and extensive third spacing with large right and small left pleural effusions.  Substantial ascites noted.  Possibility of hemoperitoneum in the context of decreased hemoglobin levels. I discussed the concerns for extensive progression of the patient's metastatic gallbladder cancer as well as his mantle cell lymphoma. We discussed that in the context of his significantly worsened functional status and age significant palliative chemotherapy to try to control his metastatic gallbladder cancer is  unlikely to be helpful or life prolonging and might make him feel even worse.  ECOG performance status currently 3. Based on our previous discussions and the current changes in his clinical status and radiographic evidence of significant progression I had a detailed goals of care discussion with him his wife and his daughter at bedside. Patient's makes unequivocal decision to pursue comfort cares through hospice. Plan -Will stop his acalabrutinib and other nonessential medications. -Please consult social worker and palliative care team to help with transition to home hospice. -Would recommend consideration of therapeutic thoracentesis and paracentesis and consideration of possible indwelling peritoneal catheter for cell drainage of ascites fluid at home. -We will transfuse 2 units of PRBCs prior to planned discharge for limited comfort care so he is able to finish the tasks he needs to do as he transitions to hospice cares. -We will likely need hospital bed and bedside commode and some other mobility devices to be in place at home prior to discharge.  This should likely be possible through home hospice team. -Anticipate patient will be here over the weekend to address some of his comfort needs and likely discharge early next week with home hospice. -Would recommend continued discussion with the patient and consideration of changing CODE STATUS to DNAR. -Appreciate hospital medicine cares. -Family very appreciative of all the cares.  The total time spent in the appointment was 60 minutes*.  All of the patient's questions were answered with apparent satisfaction. The patient knows to call the clinic with any problems, questions or concerns.   Sullivan Lone MD MS AAHIVMS [facility] Hematology/Oncology Physician [facility]  .*Total Encounter Time as defined by the Centers for Medicare and Medicaid Services includes, in addition to the face-to-face time of a patient visit (documented in  the note above) non-face-to-face time: obtaining and reviewing outside history, ordering and reviewing medications, tests or procedures, care coordination (communications with other health care professionals or caregivers) and documentation in the medical record.

## 2021-09-03 DIAGNOSIS — R601 Generalized edema: Secondary | ICD-10-CM | POA: Diagnosis not present

## 2021-09-03 LAB — BASIC METABOLIC PANEL
Anion gap: 14 (ref 5–15)
BUN: 68 mg/dL — ABNORMAL HIGH (ref 8–23)
CO2: 25 mmol/L (ref 22–32)
Calcium: 7.3 mg/dL — ABNORMAL LOW (ref 8.9–10.3)
Chloride: 100 mmol/L (ref 98–111)
Creatinine, Ser: 1.67 mg/dL — ABNORMAL HIGH (ref 0.61–1.24)
GFR, Estimated: 39 mL/min — ABNORMAL LOW (ref >60)
Glucose, Bld: 68 mg/dL — ABNORMAL LOW (ref 70–99)
Potassium: 3.7 mmol/L (ref 3.5–5.1)
Sodium: 139 mmol/L (ref 135–145)

## 2021-09-03 LAB — CBC
HCT: 27.5 % — ABNORMAL LOW (ref 39.0–52.0)
Hemoglobin: 8 g/dL — ABNORMAL LOW (ref 13.0–17.0)
MCH: 30.3 pg (ref 26.0–34.0)
MCHC: 29.1 g/dL — ABNORMAL LOW (ref 30.0–36.0)
MCV: 104.2 fL — ABNORMAL HIGH (ref 80.0–100.0)
Platelets: 99 10*3/uL — ABNORMAL LOW (ref 150–400)
RBC: 2.64 MIL/uL — ABNORMAL LOW (ref 4.22–5.81)
RDW: 19.2 % — ABNORMAL HIGH (ref 11.5–15.5)
WBC: 15.8 10*3/uL — ABNORMAL HIGH (ref 4.0–10.5)
nRBC: 0.1 % (ref 0.0–0.2)

## 2021-09-03 MED ORDER — SENNOSIDES-DOCUSATE SODIUM 8.6-50 MG PO TABS
2.0000 | ORAL_TABLET | Freq: Two times a day (BID) | ORAL | Status: DC
Start: 2021-09-03 — End: 2021-09-08
  Administered 2021-09-03 – 2021-09-08 (×9): 2 via ORAL
  Filled 2021-09-03 (×9): qty 2

## 2021-09-03 MED ORDER — ENOXAPARIN SODIUM 40 MG/0.4ML IJ SOSY
40.0000 mg | PREFILLED_SYRINGE | INTRAMUSCULAR | Status: DC
  Administered 2021-09-03 – 2021-09-07 (×4): 40 mg via SUBCUTANEOUS
  Filled 2021-09-03 (×5): qty 0.4

## 2021-09-03 MED ORDER — MAGNESIUM HYDROXIDE 400 MG/5ML PO SUSP
300.0000 mL | Freq: Once | ORAL | Status: AC
  Administered 2021-09-03: 300 mL via RECTAL
  Filled 2021-09-03: qty 90

## 2021-09-03 NOTE — Evaluation (Signed)
Occupational Therapy Evaluation Patient Details Name: Mario Garcia MRN: [id number] DOB: May 14, 1931 Today's Date: 09/03/2021   History of Present Illness patient is a 88 year old male who presented with BLE edema. patient was found to have Pleural effusions/pulmonary edema, doppler was negative for DVT, HTm, and AKI. PMH: stave IV mantle cell lymphoma, diffuse Large B cell stavge IV,gllbladder adenocarcinoma s/p cholecystectomy,   Clinical Impression   Patient is a pleasant 88 year old male who was admitted for above. Patient lives at home with wife PLOF. Plan is for patient to d/c home with hospice services at time of d/c from hospital. Patient and wife were educated on ECT strategies in next level of care and AD. Patient and wife verbalized understanding. OT to continue to follow from distance to ensure no change in needs prior to d/c home. Patient would continue to benefit from skilled OT services at this time while admitted to address noted deficits in order to improve overall safety and independence in ADLs.        Recommendations for follow up therapy are one component of a multi-disciplinary discharge planning process, led by the attending physician.  Recommendations may be updated based on patient status, additional functional criteria and insurance authorization.   Follow Up Recommendations  No OT follow up    Assistance Recommended at Discharge Frequent or constant Supervision/Assistance  Patient can return home with the following A little help with walking and/or transfers;A little help with bathing/dressing/bathroom;Assistance with cooking/housework;Direct supervision/assist for financial management;Assist for transportation;Direct supervision/assist for medications management;Help with stairs or ramp for entrance    Functional Status Assessment  Patient has had a recent decline in their functional status and demonstrates the ability to make significant improvements in function in  a reasonable and predictable amount of time.  Equipment Recommendations       Recommendations for Other Services       Precautions / Restrictions Precautions Precaution Comments: monitor O2 Restrictions Weight Bearing Restrictions: No      Mobility Bed Mobility Overal bed mobility: Needs Assistance Bed Mobility: Supine to Sit     Supine to sit: Mod assist     General bed mobility comments: physical assist to get trunk into midline and scoot hips to EOB with increased time     Balance Overall balance assessment: Mild deficits observed, not formally tested             ADL either performed or assessed with clinical judgement   ADL Overall ADL's : Needs assistance/impaired Eating/Feeding: Set up;Sitting Eating/Feeding Details (indicate cue type and reason): taking sips of water through personal cup Grooming: Set up;Sitting   Upper Body Bathing: Minimal assistance;Sitting   Lower Body Bathing: Minimal assistance;Sit to/from stand;Sitting/lateral leans Lower Body Bathing Details (indicate cue type and reason): assuming with fatigue level during session Upper Body Dressing : Set up;Sitting   Lower Body Dressing: Minimal assistance;Sit to/from stand;Sitting/lateral leans   Toilet Transfer: Min guard;Rolling walker (2 wheels);BSC/3in1 Toilet Transfer Details (indicate cue type and reason): patient was able to transfer to [facility] with min guard with RW with education on proper hand placement for transfers. patient was eager to get up and move once on edge of bed. patient was min guard to transfer from 3 in 1 commode at bedside to recliner. with noted fatigue and O2 88% on RA Toileting- Clothing Manipulation and Hygiene: Min guard;Sit to/from stand       Functional mobility during ADLs: Min guard;Rolling walker (2 wheels) General ADL Comments: noted  to fatigue quickly during session.     Vision Patient Visual Report: No change from baseline       Perception     Praxis       Pertinent Vitals/Pain Pain Assessment Pain Assessment: No/denies pain     Hand Dominance Right   Extremity/Trunk Assessment Upper Extremity Assessment Upper Extremity Assessment: Generalized weakness (h/o bilateral rotator cuff issues)   Lower Extremity Assessment Lower Extremity Assessment: Defer to PT evaluation   Cervical / Trunk Assessment Cervical / Trunk Assessment: Normal   Communication Communication Communication: No difficulties   Cognition Arousal/Alertness: Awake/alert Behavior During Therapy: WFL for tasks assessed/performed Overall Cognitive Status: Within Functional Limits for tasks assessed             General Comments: pleasant gentleman with wife present in room.     General Comments       Exercises     Shoulder Instructions      Home Living Family/patient expects to be discharged to:: Private residence Living Arrangements: Spouse/significant other Available Help at Discharge: Family;Available 24 hours/day Type of Home: House             Bathroom Shower/Tub: Walk-in shower         Home Equipment: Conservation officer, nature (2 wheels);Cane - single point;Shower seat;BSC/3in1          Prior Functioning/Environment Prior Level of Function : Independent/Modified Independent                        OT Problem List: Decreased activity tolerance;Impaired balance (sitting and/or standing);Decreased safety awareness;Decreased knowledge of precautions;Decreased knowledge of use of DME or AE      OT Treatment/Interventions: Self-care/ADL training;Neuromuscular education;Energy conservation;DME and/or AE instruction;Therapeutic activities;Patient/family education    OT Goals(Current goals can be found in the care plan section) Acute Rehab OT Goals Patient Stated Goal: to go home OT Goal Formulation: With patient/family Time For Goal Achievement: 09/17/21 Potential to Achieve Goals: Good  OT Frequency: Min 1X/week    Co-evaluation               AM-PAC OT "6 Clicks" Daily Activity     Outcome Measure Help from another person eating meals?: None Help from another person taking care of personal grooming?: A Little Help from another person toileting, which includes using toliet, bedpan, or urinal?: A Little Help from another person bathing (including washing, rinsing, drying)?: A Little Help from another person to put on and taking off regular upper body clothing?: A Little Help from another person to put on and taking off regular lower body clothing?: A Little 6 Click Score: 19   End of Session Equipment Utilized During Treatment: Rolling walker (2 wheels);Oxygen Nurse Communication: Other (comment);Mobility status (O2 during session)  Activity Tolerance: Patient tolerated treatment well Patient left: in chair;with call bell/phone within reach;with chair alarm set;with family/visitor present  OT Visit Diagnosis: Unsteadiness on feet (R26.81);Muscle weakness (generalized) (M62.81)                Time: 4562-5638 OT Time Calculation (min): 33 min Charges:  OT General Charges $OT Visit: 1 Visit OT Evaluation $OT Eval Low Complexity: 1 Low OT Treatments $Self Care/Home Management : 8-22 mins  Jackelyn Poling OTR/L, MS Acute Rehabilitation Department Office# [phone] Pager# [phone]   Marcellina Millin 09/03/2021, 9:36 AM

## 2021-09-03 NOTE — Progress Notes (Addendum)
PROGRESS NOTE  Mario Garcia LEX:[id number] DOB: [birth year] DOA: 09/01/2021 PCP: Denita Lung, MD  HPI/Recap of past 24 hours: 88 y.o. male with past medical history significant for mantle cell lymphoma, gallbladder CA, HTN who presented with bilateral lower leg edema, recently placed on Lasix but without improvement.  He went for Y-90 treatment with interventional radiology 6/8 morning. When they noticed his Scr was greatly elevated and he had significant swelling, he was sent to the ED for evaluation. In the ED work-up revealed serum creatinine at 1.9 alk phos 305 WBC 12.8 hemoglobin 7.7 g platelet 88 K, admitted for anasarca.  Heme-onc was consulted.  Bilateral lower extremity Doppler ultrasound was negative for DVT.    09/03/2021: The patient was seen and examined at his bedside.  His wife was present in the room.  Reports of constipation of 1 week duration.  Smog enema ordered.  No oxygen requirement 2 L to maintain oxygen saturation greater than 90%.  Assessment/Plan: Principal Problem:   Anasarca Active Problems:   Hypertension   Mantle cell lymphoma (HCC)   Gallbladder cancer (HCC)   Macrocytic anemia   AKI (acute kidney injury) ([place])   Thrombocytopenia (HCC)  Anasarca, improved Bilateral lower leg edema, improved Bilateral pleural effusion/pulmonary edema: Suspect multifactorial in the setting of gallbladder carcinoma, hypoalbuminemia.  Chest x-ray with bilateral hazy lung densities probable pleural effusion/possible pulmonary edema.  BNP 293 borderline elevated-recent echo 08/03/2021 with normal EF 60 to 65%, indeterminate diastolic function mildly elevated pulmonary artery systolic pressure-do not suspect CHF.  Negative for DVT on duplex.  Continue compression stocking elevate leg holding Lasix due to AKI Bilateral pleural effusion seen on chest x-ray, right greater than left.  Start incentive spirometer and flutter valve. Mobilize as tolerated.  Acute hypoxic respiratory  failure secondary to bilateral pleural effusion seen on chest x-ray Not on oxygen supplementation at baseline Currently requiring 2 L [place] to maintain oxygen saturation greater than 90% Incentive spirometer, flutter valve. Wean off oxygen supplementation as tolerated Home O2 evaluation prior to DC  Hypertension:  Blood pressures are soft. Continue midodrine 5 mg 3 times daily. Continue to hold off home losartan and HCTZ  Continue to closely monitor vital signs   Mantle cell lymphoma  Gallbladder cancer : May need hospice care, oncology will evaluate Palliative care has been consulted   Macrocytic anemia History of hemorrhoid: bright red bleeding per rectum a week ago after using enema for constipation no recurrence of bleeding since then.  Anemia likely in the setting of his cancer monitor H&H transfuse if less than 7 g.   AKI on CKD 3B:  Appears to be at his baseline creatinine 1.6 with GFR of 39 From creatinine of 2.01 with GFR of 31.   Continue to avoid nephrotoxic agents, dehydration and hypotension.   Continue to hold off home losartan and HCTZ.     Thrombocytopenia:  Platelet count 99 No other bleeding.   Continue to monitor  Deconditioning/debility  Continue PT OT with assistance and fall precautions Continue to mobilize as tolerated Body mass index is 21.79 kg/m.    DVT prophylaxis: Place TED hose Start: 09/01/21 1822.  Subcu Lovenox daily Code Status:   Code Status: Full Code Family Communication: plan of care discussed with patient/wife and daughter at bedside. Patient status is: Inpatient because of further goals of care anemia AKI Level of care: Telemetry    Dispo: The patient is from: Home  Anticipated disposition: To home possibly on Monday, pending palliative care evaluation. Mobility Assessment (last 72 hours)       Status is: Inpatient The patient requires at least 2 midnights for further evaluation and treatment of present  condition.   Objective: Vitals:   09/03/21 0504 09/03/21 0745 09/03/21 0749 09/03/21 1328  BP:    114/64  Pulse:    78  Resp:    16  Temp:    99 F (37.2 C)  TempSrc:    Oral  SpO2: 92% (!) 85% 95% 95%  Weight:      Height:        Intake/Output Summary (Last 24 hours) at 09/03/2021 1531 Last data filed at 09/03/2021 1330 Gross per 24 hour  Intake 450 ml  Output 700 ml  Net -250 ml   Filed Weights   09/01/21 1055 09/03/21 0500  Weight: 68.9 kg 78.3 kg    Exam:  General: 88 y.o. year-old male well developed well nourished in no acute distress.  Alert and oriented x3. Cardiovascular: Regular rate and rhythm with no rubs or gallops.  No thyromegaly or JVD noted.   Respiratory: Clear to auscultation with no wheezes or rales. Good inspiratory effort. Abdomen: Soft nontender nondistended with normal bowel sounds x4 quadrants. Musculoskeletal: Trace lower extremity edema. 2/4 pulses in all 4 extremities. Skin: No ulcerative lesions noted or rashes, Psychiatry: Mood is appropriate for condition and setting   Data Reviewed: CBC: Recent Labs  Lab 09/01/21 0811 09/01/21 2001 09/02/21 0316 09/02/21 1955 09/03/21 0345  WBC 12.8*  --   --   --  15.8*  NEUTROABS 5.1  --   --   --   --   HGB 7.7* 8.1* 7.4* 7.9* 8.0*  HCT 26.3* 27.0* 24.9* 27.3* 27.5*  MCV 102.3*  --   --   --  104.2*  PLT 88*  --   --   --  99*   Basic Metabolic Panel: Recent Labs  Lab 09/01/21 0811 09/02/21 0316 09/03/21 0345  NA 143 141 139  K 3.5 3.5 3.7  CL 102 101 100  CO2 '22 22 25  ' GLUCOSE 83 78 68*  BUN 75* 78* 68*  CREATININE 1.93* 2.01* 1.67*  CALCIUM 7.7* 7.4* 7.3*   GFR: Estimated Creatinine Clearance: 31.6 mL/min (A) (by C-G formula based on SCr of 1.67 mg/dL (H)). Liver Function Tests: Recent Labs  Lab 09/01/21 0811 09/02/21 0316  AST 55* 56*  ALT 22 21  ALKPHOS 305* 287*  BILITOT 1.0 0.9  PROT 4.5* 4.2*  ALBUMIN 2.2* 2.0*   No results for input(s): "LIPASE", "AMYLASE" in  the last 168 hours. No results for input(s): "AMMONIA" in the last 168 hours. Coagulation Profile: Recent Labs  Lab 09/01/21 0811  INR 1.1   Cardiac Enzymes: No results for input(s): "CKTOTAL", "CKMB", "CKMBINDEX", "TROPONINI" in the last 168 hours. BNP (last 3 results) No results for input(s): "PROBNP" in the last 8760 hours. HbA1C: No results for input(s): "HGBA1C" in the last 72 hours. CBG: No results for input(s): "GLUCAP" in the last 168 hours. Lipid Profile: No results for input(s): "CHOL", "HDL", "LDLCALC", "TRIG", "CHOLHDL", "LDLDIRECT" in the last 72 hours. Thyroid Function Tests: No results for input(s): "TSH", "T4TOTAL", "FREET4", "T3FREE", "THYROIDAB" in the last 72 hours. Anemia Panel: No results for input(s): "VITAMINB12", "FOLATE", "FERRITIN", "TIBC", "IRON", "RETICCTPCT" in the last 72 hours. Urine analysis: No results found for: "COLORURINE", "APPEARANCEUR", "LABSPEC", "PHURINE", "GLUCOSEU", "HGBUR", "BILIRUBINUR", "KETONESUR", "PROTEINUR", "UROBILINOGEN", "NITRITE", "LEUKOCYTESUR" Sepsis Labs: @  LABRCNTIP(procalcitonin:4,lacticidven:4)  )No results found for this or any previous visit (from the past 240 hour(s)).    Studies: No results found.  Scheduled Meds:  Chlorhexidine Gluconate Cloth  6 each Topical Daily   feeding supplement  237 mL Oral BID BM   gabapentin  200 mg Oral QHS   latanoprost  1 drop Both Eyes QHS   midodrine  5 mg Oral TID WC   pantoprazole (PROTONIX) IV  40 mg Intravenous Q12H   sodium chloride flush  10-40 mL Intracatheter Q12H    Continuous Infusions:   LOS: 2 days     Kayleen Memos, MD [facility] Pager [phone]  If 7PM-7AM, please contact night-coverage [email or web address] Password [facility] 09/03/2021, 3:31 PM

## 2021-09-03 NOTE — Evaluation (Signed)
Physical Therapy Evaluation Patient Details Name: Mario Garcia MRN: [id number] DOB: Dec 06, 1931 Today's Date: 09/03/2021  History of Present Illness  patient is a 88 year old male who presented with BLE edema. patient was found to have Pleural effusions/pulmonary edema, doppler was negative for DVT, HTm, and AKI. PMH: stave IV mantle cell lymphoma, diffuse Large B cell stavge IV,gllbladder adenocarcinoma s/p cholecystectomy,  Clinical Impression      Pt admitted with above diagnosis.  Pt pleasant and cooperative however mobility limited d/t pt just having had enema. Will continue to follow, pt does report a recent decline in mobility. Will likely benefit from HHPT.   Pt currently with functional limitations due to the deficits listed below (see PT Problem List). Pt will benefit from skilled PT to increase their independence and safety with mobility to allow discharge to the venue listed below.      Recommendations for follow up therapy are one component of a multi-disciplinary discharge planning process, led by the attending physician.  Recommendations may be updated based on patient status, additional functional criteria and insurance authorization.  Follow Up Recommendations Home health PT    Assistance Recommended at Discharge Intermittent Supervision/Assistance  Patient can return home with the following  A little help with walking and/or transfers;A little help with bathing/dressing/bathroom;Assistance with cooking/housework;Help with stairs or ramp for entrance;Assist for transportation    Equipment Recommendations Other (comment) (TBD)  Recommendations for Other Services       Functional Status Assessment Patient has had a recent decline in their functional status and demonstrates the ability to make significant improvements in function in a reasonable and predictable amount of time.     Precautions / Restrictions Precautions Precautions: Fall Precaution Comments: monitor  O2 Restrictions Weight Bearing Restrictions: No      Mobility  Bed Mobility Overal bed mobility: Needs Assistance Bed Mobility: Rolling Rolling: Min assist         General bed mobility comments: initiated rolling with min assist. pt declined EOB/OOB or attempt to transfer to [facility] ( pt  just had enema)    Transfers Overall transfer level: Needs assistance Equipment used: Rolling walker (2 wheels) Transfers: Sit to/from Stand                  Ambulation/Gait                  Stairs            Wheelchair Mobility    Modified Rankin (Stroke Patients Only)       Balance Overall balance assessment:  (unable to test)                                           Pertinent Vitals/Pain Pain Assessment Pain Assessment: Faces Faces Pain Scale: Hurts a little bit Pain Location: L hip Pain Intervention(s): Limited activity within patient's tolerance, Monitored during session, Premedicated before session    Home Living Family/patient expects to be discharged to:: Private residence Living Arrangements: Spouse/significant other Available Help at Discharge: Family;Available 24 hours/day Type of Home: House Home Access: Stairs to enter   [employer] of Steps: 2   Home Layout: One level Home Equipment: Conservation officer, nature (2 wheels);Cane - single point;Shower seat;BSC/3in1      Prior Function Prior Level of Function : Independent/Modified Independent  Mobility Comments: pt reports recent decline, difficulty with STS transfers, wife assists on stairs for safety       Hand Dominance        Extremity/Trunk Assessment   Upper Extremity Assessment Upper Extremity Assessment: Defer to OT evaluation    Lower Extremity Assessment Lower Extremity Assessment: Generalized weakness RLE Deficits / Details: diffuse edema bil LEs, AROM grossly WFL, strength at least 3/5 grossly- bil LEs       Communication    Communication: No difficulties  Cognition Arousal/Alertness: Awake/alert Behavior During Therapy: WFL for tasks assessed/performed Overall Cognitive Status: Within Functional Limits for tasks assessed                                          General Comments      Exercises     Assessment/Plan    PT Assessment Patient needs continued PT services  PT Problem List Decreased strength;Decreased activity tolerance;Decreased knowledge of use of DME;Decreased mobility       PT Treatment Interventions DME instruction;Therapeutic exercise;Gait training;Functional mobility training;Therapeutic activities;Patient/family education;Balance training    PT Goals (Current goals can be found in the Care Plan section)  Acute Rehab PT Goals Patient Stated Goal: get stronger PT Goal Formulation: With patient Time For Goal Achievement: 09/17/21 Potential to Achieve Goals: Good    Frequency Min 3X/week     Co-evaluation               AM-PAC PT "6 Clicks" Mobility  Outcome Measure Help needed turning from your back to your side while in a flat bed without using bedrails?: A Little Help needed moving from lying on your back to sitting on the side of a flat bed without using bedrails?: A Lot Help needed moving to and from a bed to a chair (including a wheelchair)?: Total Help needed standing up from a chair using your arms (e.g., wheelchair or bedside chair)?: Total Help needed to walk in hospital room?: Total Help needed climbing 3-5 steps with a railing? : Total 6 Click Score: 9    End of Session   Activity Tolerance: Other (comment) (just had enema, incr mobility deferred) Patient left: with call bell/phone within reach   PT Visit Diagnosis: Unsteadiness on feet (R26.81);Difficulty in walking, not elsewhere classified (R26.2);Muscle weakness (generalized) (M62.81)    Time: 0071-2197 PT Time Calculation (min) (ACUTE ONLY): 17 min   Charges:   PT Evaluation $PT  Eval Low Complexity: [place], PT  Acute Rehab Dept ([facility]) [phone] Pager [phone]  09/03/2021   [facility] 09/03/2021, 5:29 PM

## 2021-09-04 DIAGNOSIS — R601 Generalized edema: Secondary | ICD-10-CM | POA: Diagnosis not present

## 2021-09-04 LAB — BASIC METABOLIC PANEL
Anion gap: 13 (ref 5–15)
BUN: 67 mg/dL — ABNORMAL HIGH (ref 8–23)
CO2: 26 mmol/L (ref 22–32)
Calcium: 7.5 mg/dL — ABNORMAL LOW (ref 8.9–10.3)
Chloride: 102 mmol/L (ref 98–111)
Creatinine, Ser: 1.43 mg/dL — ABNORMAL HIGH (ref 0.61–1.24)
GFR, Estimated: 47 mL/min — ABNORMAL LOW (ref >60)
Glucose, Bld: 91 mg/dL (ref 70–99)
Potassium: 4 mmol/L (ref 3.5–5.1)
Sodium: 141 mmol/L (ref 135–145)

## 2021-09-04 LAB — CBC
HCT: 29.5 % — ABNORMAL LOW (ref 39.0–52.0)
Hemoglobin: 8.5 g/dL — ABNORMAL LOW (ref 13.0–17.0)
MCH: 29.8 pg (ref 26.0–34.0)
MCHC: 28.8 g/dL — ABNORMAL LOW (ref 30.0–36.0)
MCV: 103.5 fL — ABNORMAL HIGH (ref 80.0–100.0)
Platelets: 111 10*3/uL — ABNORMAL LOW (ref 150–400)
RBC: 2.85 MIL/uL — ABNORMAL LOW (ref 4.22–5.81)
RDW: 19.5 % — ABNORMAL HIGH (ref 11.5–15.5)
WBC: 18.2 10*3/uL — ABNORMAL HIGH (ref 4.0–10.5)
nRBC: 0.3 % — ABNORMAL HIGH (ref 0.0–0.2)

## 2021-09-04 MED ORDER — PANTOPRAZOLE SODIUM 40 MG PO TBEC
40.0000 mg | DELAYED_RELEASE_TABLET | Freq: Two times a day (BID) | ORAL | Status: DC
  Administered 2021-09-04 – 2021-09-08 (×8): 40 mg via ORAL
  Filled 2021-09-04 (×8): qty 1

## 2021-09-04 MED ORDER — LIDOCAINE 5 % EX OINT: TOPICAL_OINTMENT | Freq: Four times a day (QID) | CUTANEOUS | Status: DC | PRN

## 2021-09-04 MED ORDER — ALBUMIN HUMAN 25 % IV SOLN
12.5000 g | Freq: Once | INTRAVENOUS | Status: AC
Start: 2021-09-04 — End: 2021-09-04
  Administered 2021-09-04: 12.5 g via INTRAVENOUS
  Filled 2021-09-04: qty 50

## 2021-09-04 MED ORDER — BUTAMBEN-TETRACAINE-BENZOCAINE 2-2-14 % EX AERO
1.0000 | INHALATION_SPRAY | Freq: Once | CUTANEOUS | Status: AC
Start: 2021-09-04 — End: 2021-09-04
  Administered 2021-09-04: 1 via TOPICAL
  Filled 2021-09-04: qty 20

## 2021-09-04 MED ORDER — HYDROCORTISONE (PERIANAL) 2.5 % EX CREA
TOPICAL_CREAM | Freq: Three times a day (TID) | CUTANEOUS | Status: AC
  Filled 2021-09-04: qty 28.35

## 2021-09-04 MED ORDER — HYDROCORTISONE (PERIANAL) 2.5 % EX CREA: TOPICAL_CREAM | Freq: Four times a day (QID) | CUTANEOUS | Status: DC

## 2021-09-04 NOTE — Progress Notes (Signed)
Spoke with Dr.Hall concerning orders for Thoracentesis and Paracentesis. Dr. [place] Crane stated that they were not stat and can be done tomorrow.

## 2021-09-04 NOTE — Progress Notes (Addendum)
PROGRESS NOTE  AD GUTTMAN EGB:[id number] DOB: 23-Feb-1932 DOA: 09/01/2021 PCP: Denita Lung, MD  HPI/Recap of past 24 hours: 88 y.o. male with past medical history significant for mantle cell lymphoma, gallbladder CA, HTN who presented from home with bilateral lower extremity edema, recently placed on Lasix but without improvement.  In the ED, work-up revealed serum creatinine at 1.9 alk phos 305 WBC 12.8 hemoglobin 7.7, platelet 88 K, admitted for anasarca.  Heme-onc was consulted.  Bilateral lower extremity Doppler ultrasound was negative for DVT.  Hospital course complicated by constipation for which she received smog enema x1 on 09/03/2021.  Also complicated by proctalgia for which he received Cetacaine spray x1 for rectal pain.  Lidocaine 5% ointment for rectal pain.  Started on Anusol for external hemorrhoids.  09/04/2021: The patient was seen and examined at his bedside.  His wife and daughter were present at bedside.  Reported rectal pain, was started on treatment as stated above.  Assessment/Plan: Principal Problem:   Anasarca Active Problems:   Hypertension   Mantle cell lymphoma (HCC)   Gallbladder cancer (HCC)   Macrocytic anemia   AKI (acute kidney injury) ([place])   Thrombocytopenia (HCC)  Anasarca, improved Bilateral lower leg edema, improved Bilateral pleural effusion/pulmonary edema: Suspect multifactorial in the setting of gallbladder carcinoma, hypoalbuminemia.  Chest x-ray with bilateral hazy lung densities probable pleural effusion/possible pulmonary edema.  BNP 293 borderline elevated-recent echo 08/03/2021 with normal EF 60 to 65%, indeterminate diastolic function mildly elevated pulmonary artery systolic pressure-do not suspect CHF.  Negative for DVT on duplex.   Continue compression stocking elevate leg Holding Lasix due to AKI Bilateral pleural effusion seen on chest x-ray, right greater than left. Start incentive spirometer and flutter valve. Mobilize as  tolerated.  Large right pleural effusion and ascites seen on CT scan IR consulted for thoracentesis and paracentesis prior to discharge Plan is to discharge to home with home hospice  Acute hypoxic respiratory failure secondary to bilateral pleural effusion seen on CT scan Personally reviewed CT chest abdomen and pelvis without contrast done on 09/02/2021, showing bilateral pleural effusions right greater than left. Not on oxygen supplementation at baseline Currently requiring 2 L [place] to maintain oxygen saturation greater than 90% Incentive spirometer, flutter valve. Wean off oxygen supplementation as tolerated Home O2 evaluation prior to DC  Proctalgia He received Cetacaine spray x1 for rectal pain.  Lidocaine 5% ointment for rectal pain.  Started on Anusol for external hemorrhoids.  Hypertension:  Blood pressures are soft. Continue midodrine 5 mg 3 times daily. Continue to hold off home losartan and HCTZ  IV albumin ordered with paracentesis Continue to closely monitor vital signs   Mantle cell lymphoma  Gallbladder cancer: Plan for discharge to home with hospice TOC consulted to assist with DME's   Macrocytic anemia History of external hemorrhoid: Hemoglobin uptrending 8.5 from 8.0 No reported overt bleeding  AKI on CKD 3B:  Appears to be at his baseline creatinine 1.4 with GFR of 47 From creatinine of 2.01 with GFR of 31.   Continue to avoid nephrotoxic agents, dehydration and hypotension.   Continue to hold off home losartan and HCTZ.     Thrombocytopenia:  Platelet count 99, uptrending 111  Deconditioning/debility  Continue PT OT with assistance and fall precautions Continue to mobilize as tolerated Body mass index is 21.79 kg/m.   Goals of care Palliative care team consulted to assist with establishing goals of care Patient and family understand the patient has a terminal cancer  Plan is to discharge to home with hospice care.  Additional abnormal non contrast  CT scan findings: Newly indistinctly marginated hypodensity of the upper pole of the right thyroid with some expansion, possibly from tumor or inflammation. This is a new finding.   Critical care time: 65 minutes.     DVT prophylaxis: Place TED hose Start: 09/01/21 1822.  Subcu Lovenox daily Code Status:   Code Status: Full Code Family Communication: plan of care discussed with patient/wife and daughter at bedside. Patient status is: Inpatient because of further goals of care anemia AKI Level of care: Telemetry    Dispo: The patient is from: Home            Anticipated disposition: To home possibly on Monday, pending palliative care evaluation. Mobility Assessment (last 72 hours)       Status is: Inpatient The patient requires at least 2 midnights for further evaluation and treatment of present condition.   Objective: Vitals:   09/03/21 1328 09/03/21 1900 09/04/21 0305 09/04/21 1304  BP: 114/64 114/61 115/69 118/70  Pulse: 78 74 75 68  Resp: '16 17 18 18  ' Temp: 99 F (37.2 C) 98.1 F (36.7 C) (!) 97.5 F (36.4 C)   TempSrc: Oral Oral Oral   SpO2: 95% 96% 97% 98%  Weight:   80.2 kg   Height:        Intake/Output Summary (Last 24 hours) at 09/04/2021 1416 Last data filed at 09/04/2021 2440 Gross per 24 hour  Intake 118 ml  Output 950 ml  Net -832 ml   Filed Weights   09/01/21 1055 09/03/21 0500 09/04/21 0305  Weight: 68.9 kg 78.3 kg 80.2 kg    Exam:  General: 88 y.o. year-old male frail-appearing in no acute distress.  He is alert and interactive.   Cardiovascular: Regular rate and rhythm no rubs or gallops. Respiratory: Mild rales at bases no wheezing noted.  Poor inspiratory effort.   Abdomen: Distended, bowel sounds present.   Musculoskeletal: Trace lower extremity edema bilaterally. Skin: No ulcerative lesions noted. Psychiatry: Mood is appropriate for condition and setting.   Data Reviewed: CBC: Recent Labs  Lab 09/01/21 0811 09/01/21 2001  09/02/21 0316 09/02/21 1955 09/03/21 0345 09/04/21 0331  WBC 12.8*  --   --   --  15.8* 18.2*  NEUTROABS 5.1  --   --   --   --   --   HGB 7.7* 8.1* 7.4* 7.9* 8.0* 8.5*  HCT 26.3* 27.0* 24.9* 27.3* 27.5* 29.5*  MCV 102.3*  --   --   --  104.2* 103.5*  PLT 88*  --   --   --  99* 102*   Basic Metabolic Panel: Recent Labs  Lab 09/01/21 0811 09/02/21 0316 09/03/21 0345 09/04/21 0331  NA 143 141 139 141  K 3.5 3.5 3.7 4.0  CL 102 101 100 102  CO2 '22 22 25 26  ' GLUCOSE 83 78 68* 91  BUN 75* 78* 68* 67*  CREATININE 1.93* 2.01* 1.67* 1.43*  CALCIUM 7.7* 7.4* 7.3* 7.5*   GFR: Estimated Creatinine Clearance: 36.9 mL/min (A) (by C-G formula based on SCr of 1.43 mg/dL (H)). Liver Function Tests: Recent Labs  Lab 09/01/21 0811 09/02/21 0316  AST 55* 56*  ALT 22 21  ALKPHOS 305* 287*  BILITOT 1.0 0.9  PROT 4.5* 4.2*  ALBUMIN 2.2* 2.0*   No results for input(s): "LIPASE", "AMYLASE" in the last 168 hours. No results for input(s): "AMMONIA" in the last 168 hours.  Coagulation Profile: Recent Labs  Lab 09/01/21 0811  INR 1.1   Cardiac Enzymes: No results for input(s): "CKTOTAL", "CKMB", "CKMBINDEX", "TROPONINI" in the last 168 hours. BNP (last 3 results) No results for input(s): "PROBNP" in the last 8760 hours. HbA1C: No results for input(s): "HGBA1C" in the last 72 hours. CBG: No results for input(s): "GLUCAP" in the last 168 hours. Lipid Profile: No results for input(s): "CHOL", "HDL", "LDLCALC", "TRIG", "CHOLHDL", "LDLDIRECT" in the last 72 hours. Thyroid Function Tests: No results for input(s): "TSH", "T4TOTAL", "FREET4", "T3FREE", "THYROIDAB" in the last 72 hours. Anemia Panel: No results for input(s): "VITAMINB12", "FOLATE", "FERRITIN", "TIBC", "IRON", "RETICCTPCT" in the last 72 hours. Urine analysis: No results found for: "COLORURINE", "APPEARANCEUR", "LABSPEC", "PHURINE", "GLUCOSEU", "HGBUR", "BILIRUBINUR", "KETONESUR", "PROTEINUR", "UROBILINOGEN", "NITRITE",  "LEUKOCYTESUR" Sepsis Labs: '@LABRCNTIP' (procalcitonin:4,lacticidven:4)  )No results found for this or any previous visit (from the past 240 hour(s)).    Studies: No results found.  Scheduled Meds:  Chlorhexidine Gluconate Cloth  6 each Topical Daily   enoxaparin (LOVENOX) injection  40 mg Subcutaneous Q24H   feeding supplement  237 mL Oral BID BM   gabapentin  200 mg Oral QHS   hydrocortisone   Rectal TID   latanoprost  1 drop Both Eyes QHS   midodrine  5 mg Oral TID WC   pantoprazole  40 mg Oral BID   senna-docusate  2 tablet Oral BID   sodium chloride flush  10-40 mL Intracatheter Q12H    Continuous Infusions:   LOS: 3 days     Kayleen Memos, MD [facility] Pager [phone]  If 7PM-7AM, please contact night-coverage [email or web address] Password [facility] 09/04/2021, 2:16 PM

## 2021-09-05 ENCOUNTER — Inpatient Hospital Stay (HOSPITAL_COMMUNITY): Payer: Medicare Other

## 2021-09-05 DIAGNOSIS — R601 Generalized edema: Secondary | ICD-10-CM | POA: Diagnosis not present

## 2021-09-05 LAB — BODY FLUID CELL COUNT WITH DIFFERENTIAL
Eos, Fluid: 0 %
Lymphs, Fluid: 62 %
Monocyte-Macrophage-Serous Fluid: 32 % — ABNORMAL LOW (ref 50–90)
Neutrophil Count, Fluid: 6 % (ref 0–25)
Total Nucleated Cell Count, Fluid: 645 cu mm (ref 0–1000)

## 2021-09-05 LAB — CBC
HCT: 29.7 % — ABNORMAL LOW (ref 39.0–52.0)
Hemoglobin: 8.4 g/dL — ABNORMAL LOW (ref 13.0–17.0)
MCH: 29.9 pg (ref 26.0–34.0)
MCHC: 28.3 g/dL — ABNORMAL LOW (ref 30.0–36.0)
MCV: 105.7 fL — ABNORMAL HIGH (ref 80.0–100.0)
Platelets: 103 10*3/uL — ABNORMAL LOW (ref 150–400)
RBC: 2.81 MIL/uL — ABNORMAL LOW (ref 4.22–5.81)
RDW: 19.5 % — ABNORMAL HIGH (ref 11.5–15.5)
WBC: 17.7 10*3/uL — ABNORMAL HIGH (ref 4.0–10.5)
nRBC: 0.1 % (ref 0.0–0.2)

## 2021-09-05 LAB — GRAM STAIN

## 2021-09-05 LAB — BASIC METABOLIC PANEL
Anion gap: 16 — ABNORMAL HIGH (ref 5–15)
BUN: 62 mg/dL — ABNORMAL HIGH (ref 8–23)
CO2: 24 mmol/L (ref 22–32)
Calcium: 7.2 mg/dL — ABNORMAL LOW (ref 8.9–10.3)
Chloride: 100 mmol/L (ref 98–111)
Creatinine, Ser: 1.36 mg/dL — ABNORMAL HIGH (ref 0.61–1.24)
GFR, Estimated: 50 mL/min — ABNORMAL LOW (ref >60)
Glucose, Bld: 87 mg/dL (ref 70–99)
Potassium: 4 mmol/L (ref 3.5–5.1)
Sodium: 140 mmol/L (ref 135–145)

## 2021-09-05 LAB — LACTATE DEHYDROGENASE, PLEURAL OR PERITONEAL FLUID: LD, Fluid: 128 U/L — ABNORMAL HIGH (ref 3–23)

## 2021-09-05 LAB — ALBUMIN, PLEURAL OR PERITONEAL FLUID: Albumin, Fluid: <1.5 g/dL

## 2021-09-05 LAB — GLUCOSE, PLEURAL OR PERITONEAL FLUID: Glucose, Fluid: 81 mg/dL

## 2021-09-05 MED ORDER — LIDOCAINE HCL 1 % IJ SOLN
INTRAMUSCULAR | Status: AC
  Administered 2021-09-05: 10 mL
  Filled 2021-09-05: qty 20

## 2021-09-05 NOTE — Care Management Important Message (Signed)
Important Message  Patient Details IM Letter placed in Patients room. Name: RODNEY YERA MRN: [id number] Date of Birth: [birth year]   Medicare Important Message Given:  Yes     Kerin Salen 09/05/2021, 10:14 AM

## 2021-09-05 NOTE — Evaluation (Signed)
Clinical/Bedside Swallow Evaluation Patient Details  Name: Mario Garcia MRN: [id number] Date of Birth: 1931/04/25  Today's Date: 09/05/2021 Time: SLP Start Time (ACUTE ONLY): 6789 SLP Stop Time (ACUTE ONLY): 3810 SLP Time Calculation (min) (ACUTE ONLY): 20 min  Past Medical History:  Past Medical History:  Diagnosis Date   Arthritis    Cancer ([place])    PROSTATE   History of colonic polyps    History of kidney stones    Hypertension    Inguinal hernia 03/2002   Pneumonia    "years ago"   Past Surgical History:  Past Surgical History:  Procedure Laterality Date   CATARACT EXTRACTION Bilateral    CHOLECYSTECTOMY N/A 12/14/2020   Procedure: LAPAROSCOPIC CHOLECYSTECTOMY;  Surgeon: Stark Klein, MD;  Location: [place];  Service: General;  Laterality: N/A;   COLONOSCOPY     DIRECT LARYNGOSCOPY N/A 07/08/2018   Procedure: DIRECT LARYNGOSCOPY;  Surgeon: Leta Baptist, MD;  Location: [place];  Service: ENT;  Laterality: N/A;   ESOPHAGOSCOPY N/A 07/08/2018   Procedure: ESOPHAGOSCOPY;  Surgeon: Leta Baptist, MD;  Location: [place];  Service: ENT;  Laterality: N/A;   EYE SURGERY     FLEXIBLE BRONCHOSCOPY N/A 07/08/2018   Procedure: FLEXIBLE BRONCHOSCOPY;  Surgeon: Leta Baptist, MD;  Location: [place];  Service: ENT;  Laterality: N/A;   HERNIA REPAIR Left    inguinal hernia   INGUINAL HERNIA REPAIR Right 09/03/2020   Procedure: OPEN RIGHT INGUINAL HERNIA REPAIR;  Surgeon: Clovis Riley, MD;  Location: [facility];  Service: General;  Laterality: Right;   IR 3D INDEPENDENT WKST  07/28/2021   IR ANGIOGRAM SELECTIVE EACH ADDITIONAL VESSEL  07/28/2021   IR ANGIOGRAM SELECTIVE EACH ADDITIONAL VESSEL  07/28/2021   IR ANGIOGRAM SELECTIVE EACH ADDITIONAL VESSEL  07/28/2021   IR ANGIOGRAM SELECTIVE EACH ADDITIONAL VESSEL  07/28/2021   IR ANGIOGRAM SELECTIVE EACH ADDITIONAL VESSEL  08/04/2021   IR ANGIOGRAM SELECTIVE EACH ADDITIONAL VESSEL  08/04/2021   IR ANGIOGRAM VISCERAL SELECTIVE  07/28/2021   IR ANGIOGRAM VISCERAL SELECTIVE   08/04/2021   IR EMBO TUMOR ORGAN ISCHEMIA INFARCT INC GUIDE ROADMAPPING  08/04/2021   IR IMAGING GUIDED PORT INSERTION  07/30/2018   IR RADIOLOGIST EVAL & MGMT  06/15/2021   IR US GUIDE VASC ACCESS LEFT  08/04/2021   IR US GUIDE VASC ACCESS RIGHT  07/28/2021   MASS BIOPSY Right 07/08/2018   Procedure: RIGHT NECK MASS EXCISIONAL BIOPSY;  Surgeon: Leta Baptist, MD;  Location: [place];  Service: ENT;  Laterality: Right;   PATELLA FRACTURE SURGERY Left    PROSTATE SURGERY  10/2005   RADIAL PROSTATECTOMY   ROTATOR CUFF REPAIR Bilateral    HPI:  Patient is an 88 y.o. male with PMH: mantle cell lymphoma, gallbladder CA, HTN. He presented to the hospital from home with BLE edema with symptoms starting two weeks prior to admission an patient endorsing SOB that seemed worse when lying down. He went for Y-90 treatment with interventional radiology morning of admission and When they noticed his Scr was greatly elevated and he had significant swelling, he was sent to the ED for evaluation. SLP swallow evaluation ordered secondary to patient c/o difficulty swallowing  and observed difficulty with pills and coughing after drinking water. CT chest/abdomen/pelvis showed moderate to large right and small left pleural effusion with passive atelectasis.    Assessment / Plan / Recommendation  Clinical Impression  Patient currently presenting with an oropharyngeal swallow that appears [facility]. Only noted difficulty was prolonged mastication with  solids but otherwise, no overt s/s aspiration or penetration and swallow initiation appeared timely. Patient denied any difficulty with pills at this time. He does report long h/o dry mouth and the only thing that alleviates it is "a sip of water". He has not found benefit from using dry mouth mouthwash and although toothette swab seemed to help he felt that "maybe its because I havent been able to brush my teeth". SLP discussed/educated patient on strateiges to help with dry mouth and  associated dysphagia including sips of water prior to pills/solids, trying bites of gelatin prior to pills and/or taking pills in applesauce, pudding etc, eating foods that are moist and tender (avoid dry, hard, dense foods) and avoiding/limiting PO's with alcohol, caffeine. SLP is not recommending further skilled intervention at this time. SLP Visit Diagnosis: Dysphagia, unspecified (R13.10)    Aspiration Risk  No limitations;Mild aspiration risk    Diet Recommendation Regular;Thin liquid   Liquid Administration via: Cup;Straw Medication Administration: Whole meds with liquid Supervision: Patient able to self feed Compensations: Slow rate;Small sips/bites;Follow solids with liquid Postural Changes: Seated upright at 90 degrees    Other  Recommendations Oral Care Recommendations: Oral care BID    Recommendations for follow up therapy are one component of a multi-disciplinary discharge planning process, led by the attending physician.  Recommendations may be updated based on patient status, additional functional criteria and insurance authorization.  Follow up Recommendations No SLP follow up      Assistance Recommended at Discharge PRN  Functional Status Assessment Patient has had a recent decline in their functional status and demonstrates the ability to make significant improvements in function in a reasonable and predictable amount of time.  Frequency and Duration   N/A         Prognosis   N/A     Swallow Study   General Date of Onset: 09/04/21 HPI: Patient is an 88 y.o. male with PMH: mantle cell lymphoma, gallbladder CA, HTN. He presented to the hospital from home with BLE edema with symptoms starting two weeks prior to admission an patient endorsing SOB that seemed worse when lying down. He went for Y-90 treatment with interventional radiology morning of admission and When they noticed his Scr was greatly elevated and he had significant swelling, he was sent to the ED for  evaluation. SLP swallow evaluation ordered secondary to patient c/o difficulty swallowing  and observed difficulty with pills and coughing after drinking water. CT chest/abdomen/pelvis showed moderate to large right and small left pleural effusion with passive atelectasis. Type of Study: Bedside Swallow Evaluation Previous Swallow Assessment: none found Diet Prior to this Study: Regular;Thin liquids Temperature Spikes Noted: No Respiratory Status: Room air History of Recent Intubation: No Behavior/Cognition: Alert;Cooperative;Pleasant mood Oral Cavity Assessment: Within Functional Limits Oral Care Completed by SLP: No Oral Cavity - Dentition: Adequate natural dentition Vision: Functional for self-feeding Self-Feeding Abilities: Able to feed self Patient Positioning: Upright in bed Baseline Vocal Quality: Normal Volitional Cough: Strong Volitional Swallow: Able to elicit    Oral/Motor/Sensory Function Overall Oral Motor/Sensory Function: Generalized oral weakness Facial ROM: Within Functional Limits Facial Symmetry: Within Functional Limits Facial Strength: Within Functional Limits Facial Sensation: Within Functional Limits Lingual ROM: Within Functional Limits Lingual Symmetry: Within Functional Limits Lingual Strength: Within Functional Limits Lingual Sensation: Within Functional Limits Velum: Within Functional Limits Mandible: Within Functional Limits   Ice Chips     Thin Liquid Thin Liquid: Within functional limits Presentation: Straw;Self Fed    Nectar Thick  Honey Thick     Puree Puree: Not tested   Solid     Solid: Impaired Oral Phase Impairments: Impaired mastication Other Comments: Patient with slow but efficient mastication of soft solids.      Sonia Baller, MA, CCC-SLP Speech Therapy

## 2021-09-05 NOTE — Progress Notes (Signed)
     Referral received for Vanessa Hooks for goals of care discussion. Chart reviewed and request for [place] conversations. Patient has already accepted terminal nature of his cancer. They have elected home with hospice, [facility] has accepted the patient, DME has been ordered by hospice. At this point goals appear clear.  Spoke with Dr. Francia Greaves for case discussion. She agrees goals are clear. We have agreed to sign off at this time.  Please contact us if there are further palliative needs.  Thank you for your referral and allowing PMT to assist in Zalyn Amend Hayman's care.   Walden Field, NP Palliative Medicine Team Phone: [phone]  NO CHARGE

## 2021-09-05 NOTE — Progress Notes (Signed)
PROGRESS NOTE  STACY DESHLER VAP:[id number] DOB: 21-Feb-1932 DOA: 09/01/2021 PCP: Denita Lung, MD  HPI/Recap of past 24 hours: 88 y.o. male with past medical history significant for mantle cell lymphoma, gallbladder CA, HTN who presented from home with progressive bilateral lower extremity edema, recently placed on Lasix but without improvement.  In the ED, work-up revealed serum creatinine at 1.9 alk phos 305 WBC 12.8 hemoglobin 7.7 K, platelet 88 K, admitted for anasarca.  Heme-onc was consulted.  Bilateral lower extremity Doppler ultrasound was negative for DVT.  Hospital course complicated by hypoxemia secondary to bilateral pleural effusion right greater than left, abdominal distention likely secondary to ascites seen on CT scan, constipation for which he received smog enema x1 on 09/03/2021.  Proctalgia in the setting of hemorrhoids for which he received Cetacaine spray x1 for rectal pain.  Lidocaine 5% ointment for rectal pain, Anusol for external hemorrhoids.  IR consulted for possible paracentesis and right pleurocentesis prior to discharge.  Plan is to discharge to home with home hospice care.  09/05/2021: The patient was seen and examined at his bedside.  He feels like having a Bowel Movement.  Abdomen is Distended.  2+ Pitting Edema in Lower Extremities noted Bilaterally.  Assessment/Plan: Principal Problem:   Anasarca Active Problems:   Hypertension   Mantle cell lymphoma (HCC)   Gallbladder cancer (HCC)   Macrocytic anemia   AKI (acute kidney injury) ([place])   Thrombocytopenia (HCC)  Anasarca, improved Bilateral lower leg edema, improved Bilateral pleural effusion/pulmonary edema: Suspect multifactorial in the setting of gallbladder carcinoma, hypoalbuminemia.  Chest x-ray with bilateral hazy lung densities probable pleural effusion/possible pulmonary edema.  BNP 293 borderline elevated-recent echo 08/03/2021 with normal EF 60 to 65%, indeterminate diastolic function mildly  elevated pulmonary artery systolic pressure-do not suspect CHF.  Negative for DVT on duplex.   Continue compression stocking elevate leg Holding Lasix due to AKI Bilateral pleural effusion seen on chest x-ray, right greater than left. Start incentive spirometer and flutter valve. Mobilize as tolerated.  Large right pleural effusion and ascites seen on CT scan IR consulted for thoracentesis and paracentesis prior to discharge Plan is to discharge to home with home hospice  Acute hypoxic respiratory failure secondary to bilateral pleural effusion seen on CT scan Personally reviewed CT chest abdomen and pelvis without contrast done on 09/02/2021, showing bilateral pleural effusions right greater than left. Not on oxygen supplementation at baseline Currently requiring 2 L [place] to maintain oxygen saturation greater than 90% Incentive spirometer, flutter valve. Wean off oxygen supplementation as tolerated Home O2 evaluation prior to DC  Proctalgia He received Cetacaine spray x1 for rectal pain.  Lidocaine 5% ointment for rectal pain.  Started on Anusol for external hemorrhoids.  Hypertension:  Blood pressures are soft. Continue midodrine 5 mg 3 times daily. Continue to hold off home losartan and HCTZ  IV albumin ordered with paracentesis Continue to closely monitor vital signs   Mantle cell lymphoma  Gallbladder cancer: Plan for discharge to home with hospice TOC consulted to assist with DME's   Macrocytic anemia History of external hemorrhoid: Hemoglobin uptrending 8.5 from 8.0 No reported overt bleeding  AKI on CKD 3B:  Appears to be at his baseline creatinine 1.4 with GFR of 47 From creatinine of 2.01 with GFR of 31.   Continue to avoid nephrotoxic agents, dehydration and hypotension.   Continue to hold off home losartan and HCTZ.     Thrombocytopenia:  Platelet count 99, uptrending 111  Deconditioning/debility  Continue PT OT with assistance and fall precautions Continue  to mobilize as tolerated Body mass index is 21.79 kg/m.   Goals of care Palliative care team consulted to assist with establishing goals of care Patient and family understand the patient has a terminal cancer Plan is to discharge to home with hospice care.  Additional abnormal non contrast CT scan findings: Newly indistinctly marginated hypodensity of the upper pole of the right thyroid with some expansion, possibly from tumor or inflammation. This is a new finding.        DVT prophylaxis: Place TED hose Start: 09/01/21 1822.  Subcu Lovenox daily Code Status:   Code Status: Full Code Family Communication: plan of care discussed with patient/wife and daughter at bedside. Patient status is: Inpatient because of further goals of care anemia AKI Level of care: Telemetry    Dispo: The patient is from: Home            Anticipated disposition: To home possibly on Monday, pending palliative care evaluation. Mobility Assessment (last 72 hours)       Status is: Inpatient The patient requires at least 2 midnights for further evaluation and treatment of present condition.   Objective: Vitals:   09/04/21 1933 09/05/21 0341 09/05/21 0422 09/05/21 1340  BP: 110/63 107/67  123/75  Pulse: 72 71  75  Resp: _0 Temp: (!) 97.4 F (36.3 C) 97.7 F (36.5 C)  97.6 F (36.4 C)  TempSrc: Oral Oral  Oral  SpO2: 97% 93%  98%  Weight:   79 kg   Height:        Intake/Output Summary (Last 24 hours) at 09/05/2021 1404 Last data filed at 09/05/2021 1300 Gross per 24 hour  Intake 820 ml  Output 650 ml  Net 170 ml   Filed Weights   09/03/21 0500 09/04/21 0305 09/05/21 0422  Weight: 78.3 kg 80.2 kg 79 kg    Exam:  No significant changes from previous exam  General: 88 y.o. year-old male frail-appearing in no acute distress.  He is alert and interactive.   Cardiovascular: Regular rate and rhythm no rubs or gallops. Respiratory: Mild rales at bases no wheezing noted.  Poor  inspiratory effort.   Abdomen: Distended, bowel sounds present.   Musculoskeletal: Trace lower extremity edema bilaterally. Skin: No ulcerative lesions noted. Psychiatry: Mood is appropriate for condition and setting.   Data Reviewed: CBC: Recent Labs  Lab 09/01/21 0811 09/01/21 2001 09/02/21 0316 09/02/21 1955 09/03/21 0345 09/04/21 0331 09/05/21 0316  WBC 12.8*  --   --   --  15.8* 18.2* 17.7*  NEUTROABS 5.1  --   --   --   --   --   --   HGB 7.7*   < > 7.4* 7.9* 8.0* 8.5* 8.4*  HCT 26.3*   < > 24.9* 27.3* 27.5* 29.5* 29.7*  MCV 102.3*  --   --   --  104.2* 103.5* 105.7*  PLT 88*  --   --   --  99* 111* 103*   < > = values in this interval not displayed.   Basic Metabolic Panel: Recent Labs  Lab 09/01/21 0811 09/02/21 0316 09/03/21 0345 09/04/21 0331 09/05/21 0316  NA 143 141 139 141 140  K 3.5 3.5 3.7 4.0 4.0  CL 102 101 100 102 100  CO2 _1 GLUCOSE 83 78 68* 91 87  BUN 75* 78* 68* 67* 62*  CREATININE 1.93* 2.01* 1.67* 1.43*  1.36*  CALCIUM 7.7* 7.4* 7.3* 7.5* 7.2*   GFR: Estimated Creatinine Clearance: 38.8 mL/min (A) (by C-G formula based on SCr of 1.36 mg/dL (H)). Liver Function Tests: Recent Labs  Lab 09/01/21 0811 09/02/21 0316  AST 55* 56*  ALT 22 21  ALKPHOS 305* 287*  BILITOT 1.0 0.9  PROT 4.5* 4.2*  ALBUMIN 2.2* 2.0*   No results for input(s): "LIPASE", "AMYLASE" in the last 168 hours. No results for input(s): "AMMONIA" in the last 168 hours. Coagulation Profile: Recent Labs  Lab 09/01/21 0811  INR 1.1   Cardiac Enzymes: No results for input(s): "CKTOTAL", "CKMB", "CKMBINDEX", "TROPONINI" in the last 168 hours. BNP (last 3 results) No results for input(s): "PROBNP" in the last 8760 hours. HbA1C: No results for input(s): "HGBA1C" in the last 72 hours. CBG: No results for input(s): "GLUCAP" in the last 168 hours. Lipid Profile: No results for input(s): "CHOL", "HDL", "LDLCALC", "TRIG", "CHOLHDL", "LDLDIRECT" in the last 72  hours. Thyroid Function Tests: No results for input(s): "TSH", "T4TOTAL", "FREET4", "T3FREE", "THYROIDAB" in the last 72 hours. Anemia Panel: No results for input(s): "VITAMINB12", "FOLATE", "FERRITIN", "TIBC", "IRON", "RETICCTPCT" in the last 72 hours. Urine analysis: No results found for: "COLORURINE", "APPEARANCEUR", "LABSPEC", "PHURINE", "GLUCOSEU", "HGBUR", "BILIRUBINUR", "KETONESUR", "PROTEINUR", "UROBILINOGEN", "NITRITE", "LEUKOCYTESUR" Sepsis Labs: _0 (procalcitonin:4,lacticidven:4)  )No results found for this or any previous visit (from the past 240 hour(s)).    Studies: No results found.  Scheduled Meds:  Chlorhexidine Gluconate Cloth  6 each Topical Daily   enoxaparin (LOVENOX) injection  40 mg Subcutaneous Q24H   feeding supplement  237 mL Oral BID BM   gabapentin  200 mg Oral QHS   hydrocortisone   Rectal TID   latanoprost  1 drop Both Eyes QHS   midodrine  5 mg Oral TID WC   pantoprazole  40 mg Oral BID   senna-docusate  2 tablet Oral BID   sodium chloride flush  10-40 mL Intracatheter Q12H    Continuous Infusions:   LOS: 4 days     Kayleen Memos, MD [facility] Pager [phone]  If 7PM-7AM, please contact night-coverage [email or web address] Password [facility] 09/05/2021, 2:04 PM

## 2021-09-05 NOTE — Progress Notes (Signed)
Returned to room via bed from paracentesis. Bandaid c/d/I to rt lower abd. VSS. No c/o pain voiced. Callight within reach,enc to call for assist.

## 2021-09-05 NOTE — Progress Notes (Signed)
PT Cancellation Note--d/c PT  Patient Details Name: KAIROS PANETTA MRN: [id number] DOB: 31-Jul-1931   Cancelled Treatment:    Reason Eval/Treat Not Completed: Other (comment). Per MD notes plan is for home with hospice. PT signing off.    Baxter Flattery, PT  Acute Rehab Dept [facility]) [phone] Pager [phone]  09/05/2021    [facility] 09/05/2021, 10:00 AM

## 2021-09-05 NOTE — Procedures (Signed)
Ultrasound-guided diagnostic and therapeutic paracentesis performed yielding 1.6  liters of  turbid, bloody fluid. No immediate complications. The fluid was sent to the lab for preordered studies. EBL none.

## 2021-09-05 NOTE — TOC Initial Note (Addendum)
Transition of Care [facility]) - Initial/Assessment Note    Patient Details  Name: Mario Garcia MRN: [id number] Date of Birth: [birth year]  [facility]) CM/SW Contact:    Vassie Moselle, LCSW Phone Number: 09/05/2021, 10:59 AM  Clinical Narrative:                 Met with pt and his wife, Rod Holler to discuss discharge plans. Pt's wife confirms plan for pt to return home with Hospice care. Pt and family are agreeable to pt being referred to [facility]. Pt's wife requested pt have a hospital bed at home when he is discharged. CSW ensured to pass this information onto Hospice as they will assist with providing this equipment for pt.   Pt has been approved for Hospice at home through [facility]. Hospital bed, over bed table, oxygen, nebulizer, and suction have all been ordered through [employer] by Hospice.   PTAR will need to transport patient home at discharge.     Expected Discharge Plan: Home w Hospice Care Barriers to Discharge: No Barriers Identified   Patient Goals and CMS Choice Patient states their goals for this hospitalization and ongoing recovery are:: Return home   Choice offered to / list presented to : Patient, Spouse  Expected Discharge Plan and Services Expected Discharge Plan: Home w Hospice Care In-house Referral: Clinical Social Work, Hospice / Palliative Care Discharge Planning Services: CM Consult Post Acute Care Choice: Hospice Living arrangements for the past 2 months: Single Family Home                 DME Arranged: Hospital bed DME Agency: [facility]ospice and [facility]                  Prior Living Arrangements/Services Living arrangements for the past 2 months: [place] Lives with:: Spouse Patient language and need for interpreter reviewed:: Yes Do you feel safe going back to the place where you live?: Yes      Need for Family Participation in Patient Care: Yes (Comment) Care giver support  system in place?: Yes (comment)   Criminal Activity/Legal Involvement Pertinent to Current Situation/Hospitalization: No - Comment as needed  Activities of Daily Living Home Assistive Devices/Equipment: Eyeglasses, Cane (specify quad or straight), Shower chair with back, Environmental consultant (specify type), Wheelchair (reading glasses. bilateral hearing aides) ADL Screening (condition at time of admission) Patient's cognitive ability adequate to safely complete daily activities?: Yes Is the patient deaf or have difficulty hearing?: Yes Does the patient have difficulty seeing, even when wearing glasses/contacts?: No Does the patient have difficulty concentrating, remembering, or making decisions?: Yes (" a little brain fuzz") Patient able to express need for assistance with ADLs?: Yes Does the patient have difficulty dressing or bathing?: Yes Independently performs ADLs?: No Communication: Independent Dressing (OT): Needs assistance Is this a change from baseline?: Pre-admission baseline Grooming: Independent Feeding: Independent Bathing: Independent Toileting: Needs assistance Is this a change from baseline?: Pre-admission baseline In/Out Bed: Needs assistance Is this a change from baseline?: Pre-admission baseline Walks in Home: Needs assistance Is this a change from baseline?: Pre-admission baseline Does the patient have difficulty walking or climbing stairs?: Yes Weakness of Legs: Both Weakness of Arms/Hands: Both  Permission Sought/Granted Permission sought to share information with : Facility Sport and exercise psychologist, Family Supports Permission granted to share information with : Yes, Verbal Permission Granted  Share Information with NAME: Amelia Macken     Permission granted to  share info w Relationship: Spouse  Permission granted to share info w Contact Information: [phone]  Emotional Assessment Appearance:: Appears stated age Attitude/Demeanor/Rapport: Gracious, Engaged Affect  (typically observed): Accepting Orientation: : Oriented to Self, Oriented to Place, Oriented to  Time, Oriented to Situation Alcohol / Substance Use: Not Applicable Psych Involvement: No (comment)  Admission diagnosis:  Anasarca [R60.1] Uremia [N19] AKI (acute kidney injury) ([place]) [N17.9] Bilateral lower extremity edema [R60.0] Patient Active Problem List   Diagnosis Date Noted   Anasarca 09/01/2021   Macrocytic anemia 09/01/2021   AKI (acute kidney injury) ([place]) 09/01/2021   Thrombocytopenia ([place]) 09/01/2021   Tubular adenoma of colon 06/02/2021   Presbycusis of both ears 02/21/2021   Gallbladder cancer ([place]) 12/22/2020   Atherosclerosis of aorta ([place]) 03/16/2020   Mantle cell lymphoma ([place]) 03/16/2020   Counseling regarding advance care planning and goals of care 03/16/2020   Port-A-Cath in place 09/16/2018   Diffuse large B-cell lymphoma of lymph nodes of neck ([place]) 07/26/2018   History of renal stone 08/24/2016   Hypertension 12/21/2010   Hyperlipidemia with target LDL less than 100 12/21/2010   History of prostate cancer 12/21/2010   PCP:  Denita Lung, MD Pharmacy:   [name]Phone: [phone]: [phone] [place]), [place] [address] [place] [address]: [phone]: [address] [address] [place]Phone: [phone]: [phone] [address]: [phone]: [address] [place]Phone: [phone]: [phone]    Social Determinants of Health (SDOH) Interventions    Readmission Risk Interventions    09/05/2021    10:54 AM  Readmission Risk Prevention Plan  Post Dischage Appt Complete  Medication Screening Complete  Transportation Screening Complete

## 2021-09-06 DIAGNOSIS — R601 Generalized edema: Secondary | ICD-10-CM | POA: Diagnosis not present

## 2021-09-06 LAB — CBC
HCT: 30.2 % — ABNORMAL LOW (ref 39.0–52.0)
Hemoglobin: 8.7 g/dL — ABNORMAL LOW (ref 13.0–17.0)
MCH: 30.2 pg (ref 26.0–34.0)
MCHC: 28.8 g/dL — ABNORMAL LOW (ref 30.0–36.0)
MCV: 104.9 fL — ABNORMAL HIGH (ref 80.0–100.0)
Platelets: 92 10*3/uL — ABNORMAL LOW (ref 150–400)
RBC: 2.88 MIL/uL — ABNORMAL LOW (ref 4.22–5.81)
RDW: 19.6 % — ABNORMAL HIGH (ref 11.5–15.5)
WBC: 15.7 10*3/uL — ABNORMAL HIGH (ref 4.0–10.5)
nRBC: 0.1 % (ref 0.0–0.2)

## 2021-09-06 LAB — BASIC METABOLIC PANEL
Anion gap: 12 (ref 5–15)
BUN: 55 mg/dL — ABNORMAL HIGH (ref 8–23)
CO2: 27 mmol/L (ref 22–32)
Calcium: 7.2 mg/dL — ABNORMAL LOW (ref 8.9–10.3)
Chloride: 100 mmol/L (ref 98–111)
Creatinine, Ser: 1.15 mg/dL (ref 0.61–1.24)
GFR, Estimated: >60 mL/min (ref >60)
Glucose, Bld: 78 mg/dL (ref 70–99)
Potassium: 4.2 mmol/L (ref 3.5–5.1)
Sodium: 139 mmol/L (ref 135–145)

## 2021-09-06 MED ORDER — DILTIAZEM GEL 2 %
Freq: Two times a day (BID) | CUTANEOUS | Status: DC
  Filled 2021-09-06: qty 30

## 2021-09-06 MED ORDER — FUROSEMIDE 10 MG/ML IJ SOLN
20.0000 mg | Freq: Once | INTRAMUSCULAR | Status: AC
  Administered 2021-09-06: 20 mg via INTRAVENOUS
  Filled 2021-09-06: qty 2

## 2021-09-06 MED ORDER — LIDOCAINE 5 % EX OINT: TOPICAL_OINTMENT | Freq: Four times a day (QID) | CUTANEOUS | Status: DC | PRN

## 2021-09-06 MED ORDER — SALINE SPRAY 0.65 % NA SOLN
1.0000 | NASAL | Status: DC | PRN
Start: 2021-09-06 — End: 2021-09-08

## 2021-09-06 NOTE — Progress Notes (Signed)
Scant amount of a nosebleed noted. Humidity applied to O2. Family at bedside. Callight within reach,enc to call for assist

## 2021-09-06 NOTE — Progress Notes (Signed)
PROGRESS NOTE  Mario Garcia WNU:[id number] DOB: 1931/12/15 DOA: 09/01/2021 PCP: Denita Lung, MD  HPI/Recap of past 24 hours: 88 y.o. male with past medical history significant for mantle cell lymphoma, gallbladder CA, HTN who presented from home with progressive bilateral lower extremity edema, recently placed on Lasix but without improvement.  In the ED, work-up revealed serum creatinine at 1.9 alk phos 305 WBC 12.8 hemoglobin 7.7 K, platelet 88 K, admitted for anasarca.  Heme-onc was consulted.  Bilateral lower extremity Doppler ultrasound was negative for DVT.  Hospital course complicated by hypoxemia secondary to bilateral pleural effusion right greater than left, abdominal distention likely secondary to ascites seen on CT scan, constipation for which he received smog enema x1 on 09/03/2021.  Proctalgia in the setting of hemorrhoids for which he received Cetacaine spray x1 for rectal pain.  Lidocaine 5% ointment for rectal pain, Anusol for external hemorrhoids.  IR consulted for paracentesis and right thoracentesis prior to discharge.  Plan is to discharge to home with home hospice care.  09/06/2021: The patient was seen and examined at his bedside.  He does not feel well today.  Overall weak.  Denies abdominal pain.  Assessment/Plan: Principal Problem:   Anasarca Active Problems:   Hypertension   Mantle cell lymphoma (HCC)   Gallbladder cancer (HCC)   Macrocytic anemia   AKI (acute kidney injury) ([place])   Thrombocytopenia (HCC)  Anasarca Bilateral lower leg edema Bilateral pleural effusion/pulmonary edema: Suspect multifactorial in the setting of gallbladder carcinoma, hypoalbuminemia.  Chest x-ray with bilateral pleural effusions.   BNP 293 borderline elevated-recent echo 08/03/2021 with normal EF 60 to 65%, indeterminate diastolic function mildly elevated pulmonary artery systolic pressure- Negative for DVT on duplex.   Continue compression stocking  Elevate lower extremities   Bilateral pleural effusion seen on chest x-ray, right greater than left. Continue incentive spirometer and flutter valve as tolerated. IV Lasix 20 mg x 1 on 09/06/2021, low-dose due to soft BPs.  Large right pleural effusion and ascites seen on CT scan IR consulted for thoracentesis and paracentesis prior to discharge Post paracentesis on 09/05/2021 with 1.6 L of turbid bloody fluid removed. Plan is to discharge to home with home hospice  Acute hypoxic respiratory failure secondary to bilateral pleural effusion seen on CT scan Personally reviewed CT chest abdomen and pelvis without contrast done on 09/02/2021, showing bilateral pleural effusions right greater than left. Not on oxygen supplementation at baseline Currently requiring 2 L [place] to maintain oxygen saturation greater than 90% Continue incentive spirometer, flutter valve. Wean off oxygen supplementation as tolerated Home O2 evaluation prior to DC DME oxygen prior to discharge  Proctalgia He received Cetacaine spray x1 for rectal pain.  Lidocaine 5% ointment for rectal pain.  Started on Anusol for external hemorrhoids.  Hypertension:  Blood pressures are soft. Continue midodrine 5 mg 3 times daily. Continue to hold off home losartan and HCTZ  IV albumin ordered with paracentesis Continue to closely monitor vital signs   Mantle cell lymphoma  Gallbladder cancer: Plan for discharge to home with hospice [facility] consulted to assist with DME's and home hospice arrangement.   Macrocytic anemia History of external hemorrhoid: Hemoglobin uptrending 8.7 from 8.4.  Resolved AKI on CKD2:  Appears to be at his baseline creatinine 1.1 with GFR greater than 60 From creatinine of 2.01 with GFR of 31.   Continue to avoid nephrotoxic agents, dehydration and hypotension.   Continue to hold off home losartan and HCTZ.  Thrombocytopenia:  Platelet count 92 from 99  Deconditioning/debility  Mobilize as tolerated with fall  precautions. Encourage oral intake as tolerated. Body mass index is 21.79 kg/m.   Goals of care Patient and family understand the patient has a terminal cancer Plan is to discharge to home with hospice care.  Additional abnormal non contrast CT scan findings: Newly indistinctly marginated hypodensity of the upper pole of the right thyroid with some expansion, possibly from tumor or inflammation. This is a new finding.        DVT prophylaxis: Place TED hose Start: 09/01/21 1822.  Subcu Lovenox daily Code Status:   Code Status: Full Code Family Communication: No family members at bedside. Patient status is: Inpatient because of further goals of care anemia AKI Level of care: Telemetry    Dispo: The patient is from: Home            Anticipated disposition: To home likely after right thoracentesis. Mobility Assessment (last 72 hours)       Status is: Inpatient The patient requires at least 2 midnights for further evaluation and treatment of present condition.   Objective: Vitals:   09/05/21 2017 09/06/21 0451 09/06/21 0545 09/06/21 1410  BP: (!) 107/58 111/60  (!) 111/56  Pulse: 78 78  72  Resp: _0 Temp: 98 F (36.7 C) 98.5 F (36.9 C)  98.8 F (37.1 C)  TempSrc:  Oral  Oral  SpO2: 94% 96%  94%  Weight:   79.6 kg   Height:        Intake/Output Summary (Last 24 hours) at 09/06/2021 1504 Last data filed at 09/06/2021 1300 Gross per 24 hour  Intake 720 ml  Output 1000 ml  Net -280 ml   Filed Weights   09/04/21 0305 09/05/21 0422 09/06/21 0545  Weight: 80.2 kg 79 kg 79.6 kg    Exam:  General: 88 y.o. year-old male frail in weak appearing in no acute distress.  Alert and oriented x3.   Cardiovascular: Regular rate and rhythm no rubs or gallops. Respiratory: Mild rales at bases.  No wheezing noted. Abdomen: Less distended.  Bowel sounds present.  Nontender with palpation. Musculoskeletal: 2+ pitting edema in lower extremities bilaterally. Psychiatry:  Mood is appropriate for condition and setting.   Data Reviewed: CBC: Recent Labs  Lab 09/01/21 0811 09/01/21 2001 09/02/21 1955 09/03/21 0345 09/04/21 0331 09/05/21 0316 09/06/21 0348  WBC 12.8*  --   --  15.8* 18.2* 17.7* 15.7*  NEUTROABS 5.1  --   --   --   --   --   --   HGB 7.7*   < > 7.9* 8.0* 8.5* 8.4* 8.7*  HCT 26.3*   < > 27.3* 27.5* 29.5* 29.7* 30.2*  MCV 102.3*  --   --  104.2* 103.5* 105.7* 104.9*  PLT 88*  --   --  99* 111* 103* 92*   < > = values in this interval not displayed.   Basic Metabolic Panel: Recent Labs  Lab 09/02/21 0316 09/03/21 0345 09/04/21 0331 09/05/21 0316 09/06/21 0348  NA 141 139 141 140 139  K 3.5 3.7 4.0 4.0 4.2  CL 101 100 102 100 100  CO2 _1 GLUCOSE 78 68* 91 87 78  BUN 78* 68* 67* 62* 55*  CREATININE 2.01* 1.67* 1.43* 1.36* 1.15  CALCIUM 7.4* 7.3* 7.5* 7.2* 7.2*   GFR: Estimated Creatinine Clearance: 45.8 mL/min (by C-G formula based on SCr of 1.15 mg/dL).  Liver Function Tests: Recent Labs  Lab 09/01/21 0811 09/02/21 0316  AST 55* 56*  ALT 22 21  ALKPHOS 305* 287*  BILITOT 1.0 0.9  PROT 4.5* 4.2*  ALBUMIN 2.2* 2.0*   No results for input(s): "LIPASE", "AMYLASE" in the last 168 hours. No results for input(s): "AMMONIA" in the last 168 hours. Coagulation Profile: Recent Labs  Lab 09/01/21 0811  INR 1.1   Cardiac Enzymes: No results for input(s): "CKTOTAL", "CKMB", "CKMBINDEX", "TROPONINI" in the last 168 hours. BNP (last 3 results) No results for input(s): "PROBNP" in the last 8760 hours. HbA1C: No results for input(s): "HGBA1C" in the last 72 hours. CBG: No results for input(s): "GLUCAP" in the last 168 hours. Lipid Profile: No results for input(s): "CHOL", "HDL", "LDLCALC", "TRIG", "CHOLHDL", "LDLDIRECT" in the last 72 hours. Thyroid Function Tests: No results for input(s): "TSH", "T4TOTAL", "FREET4", "T3FREE", "THYROIDAB" in the last 72 hours. Anemia Panel: No results for input(s):  "VITAMINB12", "FOLATE", "FERRITIN", "TIBC", "IRON", "RETICCTPCT" in the last 72 hours. Urine analysis: No results found for: "COLORURINE", "APPEARANCEUR", "LABSPEC", "PHURINE", "GLUCOSEU", "HGBUR", "BILIRUBINUR", "KETONESUR", "PROTEINUR", "UROBILINOGEN", "NITRITE", "LEUKOCYTESUR" Sepsis Labs: _0 (procalcitonin:4,lacticidven:4)  ) Recent Results (from the past 240 hour(s))  Culture, body fluid w Gram Stain-bottle     Status: None (Preliminary result)   Collection Time: 09/05/21  7:17 PM   Specimen: Peritoneal Washings  Result Value Ref Range Status   Specimen Description PERITONEAL  Final   Special Requests NONE  Final   Culture   Final    NO GROWTH < 24 HOURS Performed at [facility], [place] [address] [place], [place] [address]    Report Status PENDING  Incomplete  Gram stain     Status: None   Collection Time: 09/05/21  7:17 PM   Specimen: Peritoneal Washings  Result Value Ref Range Status   Specimen Description PERITONEAL  Final   Special Requests NONE  Final   Gram Stain   Final    WBC PRESENT,BOTH PMN AND MONONUCLEAR NO ORGANISMS SEEN CYTOSPIN SMEAR Performed at [facility], [address] [place], [place] [address]    Report Status 09/05/2021 FINAL  Final      Studies: US Paracentesis  Result Date: 09/05/2021 INDICATION: Patient with history of mantle cell lymphoma, gallbladder cancer, acute kidney injury, pleural effusion, ascites; request received for diagnostic and therapeutic paracentesis. EXAM: ULTRASOUND GUIDED DIAGNOSTIC AND THERAPEUTIC PARACENTESIS MEDICATIONS: 8 mL 1% lidocaine COMPLICATIONS: None immediate. PROCEDURE: Informed written consent was obtained from the patient after a discussion of the risks, benefits and alternatives to treatment. A timeout was performed prior to the initiation of the procedure. Initial ultrasound scanning demonstrates a small amount of ascites within the right lower abdominal quadrant. The right lower abdomen was  prepped and draped in the usual sterile fashion. 1% lidocaine was used for local anesthesia. Following this, a 19 gauge, 10-cm, Yueh catheter was introduced. An ultrasound image was saved for documentation purposes. The paracentesis was performed. The catheter was removed and a dressing was applied. The patient tolerated the procedure well without immediate post procedural complication. FINDINGS: A total of approximately 1.6 liters of turbid, bloody fluid was removed. Samples were sent to the laboratory as requested by the clinical team. IMPRESSION: Successful ultrasound-guided diagnostic and therapeutic paracentesis yielding 1.6 liters of peritoneal fluid. Read by: Rowe Robert, PA-C Electronically Signed   By: Jacqulynn Cadet M.D.   On: 09/05/2021 16:32    Scheduled Meds:  Chlorhexidine Gluconate Cloth  6 each Topical  Daily   enoxaparin (LOVENOX) injection  40 mg Subcutaneous Q24H   feeding supplement  237 mL Oral BID BM   gabapentin  200 mg Oral QHS   hydrocortisone   Rectal TID   latanoprost  1 drop Both Eyes QHS   midodrine  5 mg Oral TID WC   pantoprazole  40 mg Oral BID   senna-docusate  2 tablet Oral BID   sodium chloride flush  10-40 mL Intracatheter Q12H    Continuous Infusions:   LOS: 5 days     Kayleen Memos, MD [facility] Pager [phone]  If 7PM-7AM, please contact night-coverage [email or web address] Password [facility] 09/06/2021, 3:04 PM

## 2021-09-07 ENCOUNTER — Inpatient Hospital Stay (HOSPITAL_COMMUNITY): Payer: Medicare Other

## 2021-09-07 DIAGNOSIS — R601 Generalized edema: Secondary | ICD-10-CM | POA: Diagnosis not present

## 2021-09-07 DIAGNOSIS — R600 Localized edema: Secondary | ICD-10-CM | POA: Diagnosis not present

## 2021-09-07 DIAGNOSIS — C23 Malignant neoplasm of gallbladder: Secondary | ICD-10-CM | POA: Diagnosis not present

## 2021-09-07 LAB — BASIC METABOLIC PANEL
Anion gap: 12 (ref 5–15)
BUN: 47 mg/dL — ABNORMAL HIGH (ref 8–23)
CO2: 27 mmol/L (ref 22–32)
Calcium: 7 mg/dL — ABNORMAL LOW (ref 8.9–10.3)
Chloride: 98 mmol/L (ref 98–111)
Creatinine, Ser: 1.01 mg/dL (ref 0.61–1.24)
GFR, Estimated: >60 mL/min (ref >60)
Glucose, Bld: 82 mg/dL (ref 70–99)
Potassium: 4.1 mmol/L (ref 3.5–5.1)
Sodium: 137 mmol/L (ref 135–145)

## 2021-09-07 LAB — CBC
HCT: 30.8 % — ABNORMAL LOW (ref 39.0–52.0)
Hemoglobin: 8.9 g/dL — ABNORMAL LOW (ref 13.0–17.0)
MCH: 30.2 pg (ref 26.0–34.0)
MCHC: 28.9 g/dL — ABNORMAL LOW (ref 30.0–36.0)
MCV: 104.4 fL — ABNORMAL HIGH (ref 80.0–100.0)
Platelets: 91 10*3/uL — ABNORMAL LOW (ref 150–400)
RBC: 2.95 MIL/uL — ABNORMAL LOW (ref 4.22–5.81)
RDW: 19.3 % — ABNORMAL HIGH (ref 11.5–15.5)
WBC: 16.4 10*3/uL — ABNORMAL HIGH (ref 4.0–10.5)
nRBC: 0.1 % (ref 0.0–0.2)

## 2021-09-07 LAB — ALBUMIN, PLEURAL OR PERITONEAL FLUID: Albumin, Fluid: <1.5 g/dL

## 2021-09-07 LAB — LACTATE DEHYDROGENASE, PLEURAL OR PERITONEAL FLUID: LD, Fluid: 254 U/L — ABNORMAL HIGH (ref 3–23)

## 2021-09-07 LAB — CYTOLOGY - NON PAP

## 2021-09-07 LAB — BODY FLUID CELL COUNT WITH DIFFERENTIAL
Lymphs, Fluid: 19 %
Monocyte-Macrophage-Serous Fluid: 80 % (ref 50–90)
Neutrophil Count, Fluid: 1 % (ref 0–25)
Total Nucleated Cell Count, Fluid: 174 cu mm (ref 0–1000)

## 2021-09-07 LAB — MAGNESIUM: Magnesium: 1.7 mg/dL (ref 1.7–2.4)

## 2021-09-07 LAB — GRAM STAIN: Gram Stain: NONE SEEN

## 2021-09-07 LAB — GLUCOSE, PLEURAL OR PERITONEAL FLUID: Glucose, Fluid: 76 mg/dL

## 2021-09-07 LAB — PHOSPHORUS: Phosphorus: 2.5 mg/dL (ref 2.5–4.6)

## 2021-09-07 MED ORDER — ALBUMIN HUMAN 25 % IV SOLN
25.0000 g | Freq: Once | INTRAVENOUS | Status: AC
  Administered 2021-09-07: 25 g via INTRAVENOUS
  Filled 2021-09-07: qty 100

## 2021-09-07 MED ORDER — LIDOCAINE HCL 1 % IJ SOLN
INTRAMUSCULAR | Status: AC
  Administered 2021-09-07: 15 mL
  Filled 2021-09-07: qty 20

## 2021-09-07 MED ORDER — LORAZEPAM 2 MG/ML IJ SOLN
0.5000 mg | INTRAMUSCULAR | Status: AC | PRN
  Administered 2021-09-07: 0.5 mg via INTRAVENOUS
  Filled 2021-09-07: qty 1

## 2021-09-07 MED ORDER — LACTATED RINGERS IV BOLUS: 500.0000 mL | Freq: Once | INTRAVENOUS | Status: DC

## 2021-09-07 NOTE — Procedures (Signed)
Ultrasound-guided diagnostic and therapeutic right thoracentesis performed yielding 1.3 liters of yellow fluid. No immediate complications. Follow-up chest x-ray pending. The fluid was sent to the lab for preordered studies. Pt was noted to be hypotensive post procedure but alert and reported no CP, dyspnea. Pt given water to drink; has IVF running as well; nurse updated. EBL none.

## 2021-09-07 NOTE — Progress Notes (Signed)
Progress Note   Patient: Mario Garcia:[id number] DOB: 26-Jan-1932 DOA: 09/01/2021     6 DOS: the patient was seen and examined on 09/07/2021   Brief hospital course:  88 y.o. male with past medical history significant for mantle cell lymphoma, gallbladder CA, HTN who presented with bilateral lower leg edema, recently placed on Lasix but without improvement. He also had some shortness of breath worse on lying no fever cough sick contacts.He went for Y-90 treatment with interventional radiology 6/8 morning. When they noticed his Scr was greatly elevated and he had significant swelling, he was sent to the ED for evaluation. In the ED underwent further work-up with serum creatinine at 1.9 alk phos 305 WBC 12.8 hemoglobin 7.7 g platelet 88 K, admitted for anasarca heme-onc was consulted.  Duplex of the leg done negative for DVT. He reports had episode of bright red blood a wk ago after bout of constipation and when he used enema he had bleeding.No bleeding since then he had no blood after subsequent Bms.  Assessment and Plan: No notes have been filed under this hospital service. Service: Hospitalist Anasarca Bilateral lower leg edema Bilateral pleural effusion/pulmonary edema: Suspect multifactorial in the setting of gallbladder carcinoma, hypoalbuminemia.  Chest x-ray with bilateral pleural effusions.   BNP 293 borderline elevated-recent echo 08/03/2021 with normal EF 60 to 65%, indeterminate diastolic function mildly elevated pulmonary artery systolic pressure- Negative for DVT on duplex.   Continue compression stocking  Elevate lower extremities  Bilateral pleural effusion seen on chest x-ray, right greater than left. Now s/p thoracentesis 6/14 yielding 1.3L fluid. Appears transudative per Light's critieria ([facility] radio 0.55)    Large right pleural effusion and ascites seen on CT scan IR consulted for thoracentesis and paracentesis prior to discharge Post paracentesis on 09/05/2021 with 1.6 L  of turbid bloody fluid removed. -f/u thoracentesis yielding 1.3L fluid 6/14 Ultimately plan is to discharge to home with home hospice   Acute hypoxic respiratory failure secondary to bilateral pleural effusion seen on CT scan Personally reviewed CT chest abdomen and pelvis without contrast done on 09/02/2021, showing bilateral pleural effusions right greater than left. Not on oxygen supplementation at baseline Had required 2 L [place] to maintain oxygen saturation greater than 90% Continue incentive spirometer, flutter valve. Wean off oxygen supplementation as tolerated Home O2 evaluation prior to DC DME oxygen prior to discharge   Proctalgia He received Cetacaine spray x1 for rectal pain.  Lidocaine 5% ointment for rectal pain.  Started on Anusol for external hemorrhoids.   Hypertension:  Blood pressures are soft. Continue midodrine 5 mg 3 times daily. Continue to hold off home losartan and HCTZ  Continue to closely monitor vital signs   Mantle cell lymphoma  Gallbladder cancer: Plan for discharge to home with hospice [facility] consulted to assist with DME's and home hospice arrangement.   Macrocytic anemia History of external hemorrhoid: Hemoglobin uptrending 8.7 from 8.4.   Resolved AKI on CKD2:  Appears to be at his baseline creatinine 1.1 with GFR greater than 60 From creatinine of 2.01 with GFR of 31.   Continue to avoid nephrotoxic agents, dehydration and hypotension.   Continue to hold off home losartan and HCTZ.     Thrombocytopenia:  Platelet count 92 from 99   Deconditioning/debility  Mobilize as tolerated with fall precautions. Encourage oral intake as tolerated. Body mass index is 21.79 kg/m.    Goals of care Patient and family understand the patient has a terminal cancer Plan is to discharge  to home with hospice care.   Additional abnormal non contrast CT scan findings: Newly indistinctly marginated hypodensity of the upper pole of the right thyroid with some  expansion, possibly from tumor or inflammation. This is a new finding.        Subjective: Difficult to assess. Pt very tired following thoracentesis  Physical Exam: Vitals:   09/07/21 0521 09/07/21 1115 09/07/21 1136 09/07/21 1211  BP: 116/65 111/67 (!) 73/47 117/71  Pulse: 78   75  Resp: 15   20  Temp: 99 F (37.2 C)   (!) 96.7 F (35.9 C)  TempSrc: Oral     SpO2: 97%   95%  Weight:      Height:       General exam: Asleep, laying in bed, in nad Respiratory system: Normal respiratory effort, no wheezing Cardiovascular system: regular rate, s1, s2 Gastrointestinal system: Soft, nondistended, positive BS Central nervous system: CN2-12 grossly intact, strength intact Extremities: Perfused, no clubbing Skin: Normal skin turgor, no notable skin lesions seen Psychiatry: Mood normal // no visual hallucinations   Data Reviewed:  Labs reviewed: Cr 1.01, Hgb 8.9  Family Communication: Pt in room, family at bedside  Disposition: Status is: Inpatient Remains inpatient appropriate because: Severity of illness  Planned Discharge Destination: Home     Author: Marylu Lund, MD 09/07/2021 5:14 PM  For on call review [email or web address].

## 2021-09-08 DIAGNOSIS — N179 Acute kidney failure, unspecified: Secondary | ICD-10-CM

## 2021-09-08 DIAGNOSIS — R601 Generalized edema: Secondary | ICD-10-CM | POA: Diagnosis not present

## 2021-09-08 LAB — COMPREHENSIVE METABOLIC PANEL
ALT: 33 U/L (ref 0–44)
AST: 113 U/L — ABNORMAL HIGH (ref 15–41)
Albumin: 2.3 g/dL — ABNORMAL LOW (ref 3.5–5.0)
Alkaline Phosphatase: 364 U/L — ABNORMAL HIGH (ref 38–126)
Anion gap: 15 (ref 5–15)
BUN: 47 mg/dL — ABNORMAL HIGH (ref 8–23)
CO2: 25 mmol/L (ref 22–32)
Calcium: 7.3 mg/dL — ABNORMAL LOW (ref 8.9–10.3)
Chloride: 100 mmol/L (ref 98–111)
Creatinine, Ser: 1.04 mg/dL (ref 0.61–1.24)
GFR, Estimated: >60 mL/min (ref >60)
Glucose, Bld: 80 mg/dL (ref 70–99)
Potassium: 4.5 mmol/L (ref 3.5–5.1)
Sodium: 140 mmol/L (ref 135–145)
Total Bilirubin: 2.1 mg/dL — ABNORMAL HIGH (ref 0.3–1.2)
Total Protein: 4.5 g/dL — ABNORMAL LOW (ref 6.5–8.1)

## 2021-09-08 LAB — CBC
HCT: 32.8 % — ABNORMAL LOW (ref 39.0–52.0)
Hemoglobin: 9.5 g/dL — ABNORMAL LOW (ref 13.0–17.0)
MCH: 30.3 pg (ref 26.0–34.0)
MCHC: 29 g/dL — ABNORMAL LOW (ref 30.0–36.0)
MCV: 104.5 fL — ABNORMAL HIGH (ref 80.0–100.0)
Platelets: 99 10*3/uL — ABNORMAL LOW (ref 150–400)
RBC: 3.14 MIL/uL — ABNORMAL LOW (ref 4.22–5.81)
RDW: 19.1 % — ABNORMAL HIGH (ref 11.5–15.5)
WBC: 19.3 10*3/uL — ABNORMAL HIGH (ref 4.0–10.5)
nRBC: 0 % (ref 0.0–0.2)

## 2021-09-08 MED ORDER — GERHARDT'S BUTT CREAM
TOPICAL_CREAM | Freq: Every day | CUTANEOUS | Status: DC
Start: 2021-09-08 — End: 2021-09-08
  Filled 2021-09-08: qty 1

## 2021-09-08 MED ORDER — OXYCODONE HCL 5 MG PO TABS: 5.0000 mg | ORAL_TABLET | ORAL | 0 refills | Status: AC | PRN

## 2021-09-08 MED ORDER — MIDODRINE HCL 5 MG PO TABS: 5.0000 mg | ORAL_TABLET | Freq: Three times a day (TID) | ORAL | 0 refills | Status: AC

## 2021-09-08 MED ORDER — OXYCODONE HCL 5 MG PO TABS
5.0000 mg | ORAL_TABLET | ORAL | Status: DC | PRN
  Administered 2021-09-08: 5 mg via ORAL
  Filled 2021-09-08: qty 1

## 2021-09-08 MED ORDER — HEPARIN SOD (PORK) LOCK FLUSH 100 UNIT/ML IV SOLN
500.0000 [IU] | INTRAVENOUS | Status: AC | PRN
  Administered 2021-09-08: 500 [IU]
  Filled 2021-09-08: qty 5

## 2021-09-08 NOTE — Discharge Summary (Signed)
Physician Discharge Summary   Patient: Mario Garcia MRN: [id number] DOB: 1931-05-21  Admit date:     09/01/2021  Discharge date: 09/08/21  Discharge Physician: Marylu Lund   PCP: Denita Lung, MD   Recommendations at discharge:    Follow up with hospice services Follow up with PCP as needed  Discharge Diagnoses: Principal Problem:   Anasarca Active Problems:   Hypertension   Mantle cell lymphoma (HCC)   Gallbladder cancer ([place])   Macrocytic anemia   AKI (acute kidney injury) ([place])   Thrombocytopenia ([place])  Resolved Problems:   * No resolved hospital problems. *  Hospital Course:  88 y.o. male with past medical history significant for mantle cell lymphoma, gallbladder CA, HTN who presented with bilateral lower leg edema, recently placed on Lasix but without improvement. He also had some shortness of breath worse on lying no fever cough sick contacts.He went for Y-90 treatment with interventional radiology 6/8 morning. When they noticed his Scr was greatly elevated and he had significant swelling, he was sent to the ED for evaluation. In the ED underwent further work-up with serum creatinine at 1.9 alk phos 305 WBC 12.8 hemoglobin 7.7 g platelet 88 K, admitted for anasarca heme-onc was consulted.  Duplex of the leg done negative for DVT. He reports had episode of bright red blood a wk ago after bout of constipation and when he used enema he had bleeding.No bleeding since then he had no blood after subsequent Bms.  Assessment and Plan: No notes have been filed under this hospital service. Service: Hospitalist Anasarca Bilateral lower leg edema Bilateral pleural effusion/pulmonary edema: Suspect multifactorial in the setting of gallbladder carcinoma, hypoalbuminemia.  Chest x-ray with bilateral pleural effusions.   BNP 293 borderline elevated-recent echo 08/03/2021 with normal EF 60 to 65%, indeterminate diastolic function mildly elevated pulmonary artery systolic  pressure- Negative for DVT on duplex.   Continue compression stocking  Elevate lower extremities  Bilateral pleural effusion seen on chest x-ray, right greater than left. Now s/p thoracentesis 6/14 yielding 1.3L fluid. Appears transudative per Light's critieria ([facility] radio 0.55)    Large right pleural effusion and ascites seen on CT scan IR consulted for thoracentesis and paracentesis this visit Post paracentesis on 09/05/2021 with 1.6 L of turbid bloody fluid removed. -f/u thoracentesis yielding 1.3L fluid 6/14 -Plan is to discharge to home with home hospice   Acute hypoxic respiratory failure secondary to bilateral pleural effusion seen on CT scan Personally reviewed CT chest abdomen and pelvis without contrast done on 09/02/2021, showing bilateral pleural effusions right greater than left. Not on oxygen supplementation at baseline Had required 2 L [place] to maintain oxygen saturation greater than 90% Continue incentive spirometer, flutter valve. Plan for home O2   Proctalgia He received Cetacaine spray x1 for rectal pain.  Lidocaine 5% ointment for rectal pain.  Started on Anusol for external hemorrhoids.   Hypertension:  Blood pressures are soft. Continue midodrine 5 mg 3 times daily. Continue to hold off home losartan and HCTZ  Continue to closely monitor vital signs   Mantle cell lymphoma  Gallbladder cancer: Plan for discharge to home with hospice [facility] consulted to assist with DME's and home hospice arrangement.   Macrocytic anemia History of external hemorrhoid: Hemoglobin uptrending 8.7 from 8.4.   Resolved AKI on CKD2:  Appears to be at his baseline creatinine 1.1 with GFR greater than 60 From creatinine of 2.01 with GFR of 31.   Continue to avoid nephrotoxic agents, dehydration and hypotension.  Continue to hold off home losartan and HCTZ.     Thrombocytopenia:  Platelet count 92 from 99   Deconditioning/debility  Mobilize as tolerated with fall  precautions. Encourage oral intake as tolerated. Body mass index is 21.79 kg/m.    Goals of care Patient and family understand the patient has a terminal cancer Plan is to discharge to home with hospice care.   Additional abnormal non contrast CT scan findings: Newly indistinctly marginated hypodensity of the upper pole of the right thyroid with some expansion, possibly from tumor or inflammation. This is a new finding.   Chronic hip pain -Family reports chronic hip pain, unrelieved with ultram -NCCSR reviewed. Will prescribe limited quantity of oxycodone for pain relief. Have prescribed for end of life       Consultants: Oncology, Palliative Care Procedures performed: Paracentesis, thoracentesis  Disposition: Hospice care Diet recommendation:  Regular diet DISCHARGE MEDICATION: Allergies as of 09/08/2021       Reactions   Lisinopril Cough   Penicillins Rash   Did it involve swelling of the face/tongue/throat, SOB, or low BP? No Did it involve sudden or severe rash/hives, skin peeling, or any reaction on the inside of your mouth or nose? Yes Did you need to seek medical attention at a hospital or doctor's office? Yes When did it last happen?   50  years If all above answers are "NO", may proceed with cephalosporin use.        Medication List     STOP taking these medications    Calquence 100 MG Tabs Generic drug: Acalabrutinib Maleate   furosemide 20 MG tablet Commonly known as: LASIX   losartan-hydrochlorothiazide 50-12.5 MG tablet Commonly known as: HYZAAR   PRESCRIPTION MEDICATION   traMADol 50 MG tablet Commonly known as: ULTRAM       TAKE these medications    acetaminophen 500 MG tablet Commonly known as: TYLENOL Take 1,000 mg by mouth every 6 (six) hours as needed for moderate pain.   ARTIFICIAL TEAR SOLUTION OP Place 1 drop into both eyes daily as needed (sensitive eyes).   calcium carbonate 500 MG chewable tablet Commonly known as: TUMS  - dosed in mg elemental calcium Chew 2 tablets by mouth daily as needed for indigestion or heartburn.   gabapentin 100 MG capsule Commonly known as: NEURONTIN Take 2 capsules (200 mg total) by mouth at bedtime.   ibuprofen 200 MG tablet Commonly known as: ADVIL Take 400 mg by mouth 2 (two) times daily as needed (pain).   latanoprost 0.005 % ophthalmic solution Commonly known as: XALATAN Place 1 drop into both eyes at bedtime.   lidocaine-prilocaine cream Commonly known as: EMLA Apply 1 application. topically as needed (port access).   midodrine 5 MG tablet Commonly known as: PROAMATINE Take 1 tablet (5 mg total) by mouth 3 (three) times daily with meals.   ondansetron 4 MG tablet Commonly known as: Zofran Take 1 tablet (4 mg total) by mouth every 8 (eight) hours as needed for nausea or vomiting. What changed: when to take this   oxyCODONE 5 MG immediate release tablet Commonly known as: Oxy IR/ROXICODONE Take 1 tablet (5 mg total) by mouth every 4 (four) hours as needed for severe pain.   senna-docusate 8.6-50 MG tablet Commonly known as: Senna S Take 2 tablets by mouth at bedtime as needed for mild constipation.               Durable Medical Equipment  (From admission, onward)  Start     Ordered   09/06/21 1652  For home use only DME oxygen  Once       Question Answer Comment  Length of Need 6 Months   Mode or (Route) Nasal cannula   Liters per Minute 2   Frequency Continuous (stationary and portable oxygen unit needed)   Oxygen conserving device Yes   Oxygen delivery system Gas      09/06/21 1651   09/04/21 1134  For home use only DME Hospital bed  Once       Question Answer Comment  Length of Need 6 Months   The above medical condition requires: Patient requires the ability to reposition frequently   Head must be elevated greater than: 30 degrees   Bed type Semi-electric   Hoyer Lift Yes   Trapeze Bar Yes      09/04/21 1134    09/04/21 1133  For home use only DME Shower stool  Once        09/04/21 1133   09/04/21 1133  For home use only DME 3 n 1  Once        09/04/21 1133   09/04/21 1132  For home use only DME Bedside commode  Once       Question:  Patient needs a bedside commode to treat with the following condition  Answer:  Ambulatory dysfunction   09/04/21 1131   09/04/21 1132  For home use only DME Walker rolling  Once       Comments: 2 wheels  Question Answer Comment  Walker: With [place]   Patient needs a walker to treat with the following condition Ambulatory dysfunction      09/04/21 1133            Discharge Exam: Filed Weights   09/05/21 0422 09/06/21 0545 09/08/21 0500  Weight: 79 kg 79.6 kg 77.4 kg   General exam: Awake, laying in bed, in nad Respiratory system: Normal respiratory effort, no wheezing Cardiovascular system: regular rate, s1, s2 Gastrointestinal system: Soft, nondistended, positive BS Central nervous system: CN2-12 grossly intact, strength intact Extremities: Perfused, no clubbing Skin: Normal skin turgor, no notable skin lesions seen Psychiatry: Mood normal // no visual hallucinations   Condition at discharge: poor  The results of significant diagnostics from this hospitalization (including imaging, microbiology, ancillary and laboratory) are listed below for reference.   Imaging Studies: US THORACENTESIS ASP PLEURAL SPACE W/IMG GUIDE  Result Date: 09/07/2021 INDICATION: Patient with history of mantle cell lymphoma, gallbladder cancer, acute kidney injury, pleural effusion, ascites. Request received for diagnostic and therapeutic right thoracentesis. EXAM: ULTRASOUND GUIDED DIAGNOSTIC AND THERAPEUTIC RIGHT THORACENTESIS MEDICATIONS: 8 mL 1% lidocaine COMPLICATIONS: None immediate. PROCEDURE: An ultrasound guided thoracentesis was thoroughly discussed with the patient and questions answered. The benefits, risks, alternatives and complications were also discussed.  The patient understands and wishes to proceed with the procedure. Written consent was obtained. Ultrasound was performed to localize and mark an adequate pocket of fluid in the right chest. The area was then prepped and draped in the normal sterile fashion. 1% Lidocaine was used for local anesthesia. Under ultrasound guidance a 6 Fr Safe-T-Centesis catheter was introduced. Thoracentesis was performed. The catheter was removed and a dressing applied. FINDINGS: A total of approximately 1.3 liters of yellow fluid was removed. Samples were sent to the laboratory as requested by the clinical team. Patient was noted to be hypotensive postprocedure but reported no chest pain or dyspnea. Water given to patient.  He also has IV fluids running. Nurse updated. IMPRESSION: Successful ultrasound guided diagnostic and therapeutic right thoracentesis yielding 1.3 liters of pleural fluid. Read by: Rowe Robert, PA-C Electronically Signed   By: Ruthann Cancer M.D.   On: 09/07/2021 12:30   DG CHEST PORT 1 VIEW  Result Date: 09/07/2021 CLINICAL DATA:  Post RIGHT-sided thoracentesis in a 88 year old male. EXAM: PORTABLE CHEST 1 VIEW COMPARISON:  September 01, 2021 and CT of the chest abdomen pelvis from September 02, 2021. FINDINGS: LEFT-sided Port-A-Cath terminates at the caval to atrial junction. EKG leads project over the chest. Cardiomediastinal contours and hilar structures are stable. Increased interstitial markings throughout the chest with subtle nodularity better seen on recent CT imaging. Post thoracentesis on the RIGHT with minimal blunting of RIGHT costodiaphragmatic sulcus and added density at the RIGHT lung base much improved compared to previous imaging without visible pneumothorax. Improved aeration in the LEFT chest as well compared to previous imaging. Graded opacity remains evident bilaterally at the lung bases. On limited assessment there is no acute skeletal finding. IMPRESSION: 1. Improved aeration marked decreased pleural  fluid since previous imaging following RIGHT thoracentesis. No pneumothorax. 2. Persistent basilar airspace disease likely with small amount of pleural fluid on the LEFT and RIGHT. 3. Nodularity seen in the chest on previous chest CT not well seen on the current study. Electronically Signed   By: Zetta Bills M.D.   On: 09/07/2021 12:08   US Paracentesis  Result Date: 09/05/2021 INDICATION: Patient with history of mantle cell lymphoma, gallbladder cancer, acute kidney injury, pleural effusion, ascites; request received for diagnostic and therapeutic paracentesis. EXAM: ULTRASOUND GUIDED DIAGNOSTIC AND THERAPEUTIC PARACENTESIS MEDICATIONS: 8 mL 1% lidocaine COMPLICATIONS: None immediate. PROCEDURE: Informed written consent was obtained from the patient after a discussion of the risks, benefits and alternatives to treatment. A timeout was performed prior to the initiation of the procedure. Initial ultrasound scanning demonstrates a small amount of ascites within the right lower abdominal quadrant. The right lower abdomen was prepped and draped in the usual sterile fashion. 1% lidocaine was used for local anesthesia. Following this, a 19 gauge, 10-cm, Yueh catheter was introduced. An ultrasound image was saved for documentation purposes. The paracentesis was performed. The catheter was removed and a dressing was applied. The patient tolerated the procedure well without immediate post procedural complication. FINDINGS: A total of approximately 1.6 liters of turbid, bloody fluid was removed. Samples were sent to the laboratory as requested by the clinical team. IMPRESSION: Successful ultrasound-guided diagnostic and therapeutic paracentesis yielding 1.6 liters of peritoneal fluid. Read by: Rowe Robert, PA-C Electronically Signed   By: Jacqulynn Cadet M.D.   On: 09/05/2021 16:32   CT CHEST ABDOMEN PELVIS WO CONTRAST  Result Date: 09/02/2021 CLINICAL DATA:  Gallbladder carcinoma with liver metastases. Restaging  assessment. Y 90 microsphere therapy to the right hepatic lobe on 08/04/2021. History of mantle cell lymphoma. * Tracking Code: BO * EXAM: CT CHEST, ABDOMEN AND PELVIS WITHOUT CONTRAST TECHNIQUE: Multidetector CT imaging of the chest, abdomen and pelvis was performed following the standard protocol without IV contrast. RADIATION DOSE REDUCTION: This exam was performed according to the departmental dose-optimization program which includes automated exposure control, adjustment of the mA and/or kV according to patient size and/or use of iterative reconstruction technique. COMPARISON:  PET-CT 05/04/2021 and MRI abdomen 05/25/2021 FINDINGS: CT CHEST FINDINGS Cardiovascular: Left Port-A-Cath tip: Cavoatrial junction. Coronary, aortic arch, and branch vessel atherosclerotic vascular disease. Mild cardiomegaly. Low-density blood pool compatible with anemia. Mediastinum/Nodes: New (from  05/04/2021) indistinctly marginated hypodensity of the upper pole of the right thyroid lobe for example on image 5 series 2, with associated enlargement the right thyroid upper lobe. Previously hypermetabolic scattered mediastinal, axillary, internal mammary, and hilar adenopathy generally mildly improved from prior. Left axillary node 1.2 cm in short axis on image 9 series 2, previously 1.6 cm. Right lower paratracheal node 0.9 cm in short axis on image 23 series 2, formerly 1.1 cm. Small type 1 hiatal hernia. Lungs/Pleura: Moderate to large right and small left pleural effusion with passive atelectasis. Scattered randomly distributed micro nodularity throughout both lungs, some sharply defined and some of the nodules with irregular margins. Most of the nodules are subcentimeter in size. A left lower lobe nodule on image 109 series 6 measures 1.1 by 1.1 cm. An index right upper lobe lobe nodule medially measures 1.4 by 0.9 cm on image 66 series 6. Innumerable nodules are present. This could represent infectious disease or metastatic  disease. Musculoskeletal: Unremarkable CT ABDOMEN PELVIS FINDINGS Hepatobiliary: Innumerable hypodense nodules and masses are present scattered throughout the right and left hepatic lobes. Some of these likely represent enlargement of prior lesions, for example a 4.0 by 2.7 cm lesion in the lateral segment left hepatic lobe on image 63 series 2 previously measured about 1.6 cm in diameter on 05/25/2021. Many of the lesions are new. Hypodensity spanning between the right and left hepatic lobes in the central liver measures about 8.6 by 7.3 cm on image 62 of series 2. The tumor in this vicinity previously measured about 5.0 by 4.8 cm on 05/25/2021. The extrahepatic biliary tree is obscured. Pancreas: There is diffuse edema throughout the mesentery along with ascites. Peripancreatic edema is probably simply part of this process, correlate with lipase levels to exclude pancreatitis. Spleen: Spleen measures 11.8 by 7.1 by 15.4 cm (volume = 680 cm^3), compatible with splenomegaly. Adrenals/Urinary Tract: Unremarkable Stomach/Bowel: No dilated bowel.  No extraluminal gas. Vascular/Lymphatic: Atherosclerosis is present, including aortoiliac atherosclerotic disease. Moderate pathologic retroperitoneal adenopathy. Index aortocaval node 1.5 cm in short axis on image 73 series 2, previously 1.4 cm on 05/25/2021. Stable mild pelvic adenopathy. Enlarged left perirectal lymph node 1.1 cm in short axis on image 111 series 2 Reproductive: Prostatectomy. Other: Substantial ascites along with prominent subcutaneous edema and mesenteric edema favoring third spacing of fluid. Fluid infiltration of the omentum. Worth noting, in the pelvis and in the right paracolic gutter there is some dependent density along the ascites, raising the possibility of debris or complexity such as blood products. Musculoskeletal: Substantial edema tracking along muscular fascia planes for example in the upper thighs on image 140 series 2. Lower lumbar  spondylosis and degenerative disc disease with impingement observed at L4-5 and L5-S1. IMPRESSION: 1. Innumerable new and enlarging liver masses compatible with diffuse hepatic metastatic disease. 2. Innumerable new scattered pulmonary nodules, some irregular and some well-defined. Possibilities include metastatic disease or infection. The nodules do not appear cavitary. 3. Extensive third spacing of fluid with large right and small left pleural effusions, diffuse subcutaneous and mesenteric edema, and substantial ascites. Portions of the ascites are complex with higher density dependent components, and hemoperitoneum is not excluded. 4. Mild improvement in thoracic adenopathy compared to prior. Roughly similar abdominopelvic adenopathy compared to prior. 5. Low-density blood pool compatible with anemia. 6. Newly indistinctly marginated hypodensity of the upper pole of the right thyroid with some expansion, possibly from tumor or inflammation. This is a new finding. 7. Other imaging findings of potential clinical significance:  Aortic Atherosclerosis (ICD10-I70.0). Coronary atherosclerosis. Mild cardiomegaly. Splenomegaly. Lower lumbar impingement. Electronically Signed   By: Van Clines M.D.   On: 09/02/2021 15:12   US RENAL  Result Date: 09/01/2021 CLINICAL DATA:  Acute kidney injury. EXAM: RENAL / URINARY TRACT ULTRASOUND COMPLETE COMPARISON:  None Available. FINDINGS: Right Kidney: Renal measurements: 10.8 cm x 5.1 cm x 5.2 cm = volume: 149.4 mL. Echogenicity within normal limits. No mass or hydronephrosis visualized. Left Kidney: Renal measurements: 11.4 cm x 4.1 cm x 4.6 cm = volume: 113.6 mL. Echogenicity within normal limits. No mass or hydronephrosis visualized. Bladder: Appears normal for degree of bladder distention. Other: Of incidental note is the presence of a moderate amount of ascites. IMPRESSION: 1. Ascites. 2. Normal ultrasonographic appearance of the kidneys and urinary bladder.  Electronically Signed   By: Virgina Norfolk M.D.   On: 09/01/2021 20:39   VAS [place] LOWER EXTREMITY VENOUS (DVT) (ONLY [facility] & [facility])  Result Date: 09/01/2021  Lower Venous DVT Study Patient Name:  Mario Garcia  Date of Exam:   09/01/2021 Medical Rec #: [id number]      Accession #:    [id number] Date of Birth: 1931-05-31      Patient Gender: M Patient Age:   74 years Exam Location:  [facility] Procedure:      VAS [place] LOWER EXTREMITY VENOUS (DVT) Referring Phys: DAN FLOYD --------------------------------------------------------------------------------  Indications: Edema.  Risk Factors: CA patient. Limitations: Poor ultrasound/tissue interface. Comparison Study: No previous exams Performing Technologist: Jody Hill RVT, RDMS  Examination Guidelines: A complete evaluation includes B-mode imaging, spectral Doppler, color Doppler, and power Doppler as needed of all accessible portions of each vessel. Bilateral testing is considered an integral part of a complete examination. Limited examinations for reoccurring indications may be performed as noted. The reflux portion of the exam is performed with the patient in reverse Trendelenburg.  +---------+---------------+---------+-----------+----------+-------------------+ RIGHT    CompressibilityPhasicitySpontaneityPropertiesThrombus Aging      +---------+---------------+---------+-----------+----------+-------------------+ CFV      Full           Yes      Yes                                      +---------+---------------+---------+-----------+----------+-------------------+ SFJ      Full                                                             +---------+---------------+---------+-----------+----------+-------------------+ FV Prox  Full           Yes      Yes                                      +---------+---------------+---------+-----------+----------+-------------------+ FV Mid   Full           Yes      Yes                                       +---------+---------------+---------+-----------+----------+-------------------+ FV DistalFull           Yes  Yes                                      +---------+---------------+---------+-----------+----------+-------------------+ PFV      Full                                                             +---------+---------------+---------+-----------+----------+-------------------+ POP      Full           Yes      Yes                                      +---------+---------------+---------+-----------+----------+-------------------+ PTV                                                   Not well visualized +---------+---------------+---------+-----------+----------+-------------------+ PERO     Full                                                             +---------+---------------+---------+-----------+----------+-------------------+   +---------+---------------+---------+-----------+----------+--------------+ LEFT     CompressibilityPhasicitySpontaneityPropertiesThrombus Aging +---------+---------------+---------+-----------+----------+--------------+ CFV      Full           Yes      Yes                                 +---------+---------------+---------+-----------+----------+--------------+ SFJ      Full                                                        +---------+---------------+---------+-----------+----------+--------------+ FV Prox  Full           Yes      Yes                                 +---------+---------------+---------+-----------+----------+--------------+ FV Mid   Full           Yes      Yes                                 +---------+---------------+---------+-----------+----------+--------------+ FV DistalFull           Yes      Yes                                 +---------+---------------+---------+-----------+----------+--------------+ PFV      Full                                                         +---------+---------------+---------+-----------+----------+--------------+  POP      Full           Yes      Yes                                 +---------+---------------+---------+-----------+----------+--------------+ PTV      Full                                                        +---------+---------------+---------+-----------+----------+--------------+ PERO     Full                                                        +---------+---------------+---------+-----------+----------+--------------+     Summary: RIGHT: - There is no evidence of deep vein thrombosis in the lower extremity.  - No cystic structure found in the popliteal fossa. - Diffuse subcutaneous edema to entire extremity - Ultrasound characteristics of enlarged lymph nodes are noted in the groin.  LEFT: - There is no evidence of deep vein thrombosis in the lower extremity.  - No cystic structure found in the popliteal fossa. - Diffuse subcutaneous edema to entire extremity - Ultrasound characteristics of enlarged lymph nodes noted in the groin.  *See table(s) above for measurements and observations. Electronically signed by Monica Martinez MD on 09/01/2021 at 4:46:09 PM.    Final    DG CHEST PORT 1 VIEW  Result Date: 09/01/2021 CLINICAL DATA:  Bilateral leg swelling. EXAM: PORTABLE CHEST 1 VIEW COMPARISON:  Chest radiograph 07/27/2021 FINDINGS: Left jugular Port-A-Cath with the tip near the superior cavoatrial junction. Port-A-Cath tip is stable. New hazy densities in both lungs particularly at the right lung base. Probable pleural effusions, right side greater than left. Enlarged interstitial lung markings. Nodular densities in the lungs could represent enlarged vascular structures. Difficult to exclude pulmonary nodules. Heart size is grossly within normal limits. IMPRESSION: 1. Bilateral hazy lung densities with probable pleural effusions, right side greater than left. Findings are concerning for  pulmonary edema. 2. Subtle nodular densities particularly in the left lung. These could represent vascular structures but difficult to exclude pulmonary nodules. Recommend attention on follow-up imaging and/or further evaluation with chest CT. Electronically Signed   By: Markus Daft M.D.   On: 09/01/2021 14:49    Microbiology: Results for orders placed or performed during the hospital encounter of 09/01/21  Culture, body fluid w Gram Stain-bottle     Status: Abnormal (Preliminary result)   Collection Time: 09/05/21  7:17 PM   Specimen: Peritoneal Washings  Result Value Ref Range Status   Specimen Description PERITONEAL  Final   Special Requests NONE  Final   Gram Stain   Final    GRAM POSITIVE COCCI IN CHAINS ANAEROBIC BOTTLE ONLY CRITICAL RESULT CALLED TO, READ BACK BY AND VERIFIED WITH:  Wayna Chalet., RN 09/07/21 1250 A. LAFRANCE    Culture (A)  Final    STREPTOCOCCUS SANGUINIS SUSCEPTIBILITIES TO FOLLOW Performed at [facility], [place] [address], [place], [place] [address]    Report Status PENDING  Incomplete  Gram stain     Status: None  Collection Time: 09/05/21  7:17 PM   Specimen: Peritoneal Washings  Result Value Ref Range Status   Specimen Description PERITONEAL  Final   Special Requests NONE  Final   Gram Stain   Final    WBC PRESENT,BOTH PMN AND MONONUCLEAR NO ORGANISMS SEEN CYTOSPIN SMEAR Performed at [facility], [address], [place], [place] [address]    Report Status 09/05/2021 FINAL  Final  Gram stain     Status: None   Collection Time: 09/07/21 11:28 AM   Specimen: Pleura  Result Value Ref Range Status   Specimen Description PLEURAL RIGHT  Final   Special Requests NONE  Final   Gram Stain   Final    NO WBC SEEN NO ORGANISMS SEEN Performed at [facility], [place] [address], [place],  [address]    Report Status 09/07/2021 FINAL  Final    Labs: CBC: Recent Labs  Lab 09/04/21 0331 09/05/21 0316 09/06/21 0348 09/07/21 0450  09/08/21 0309  WBC 18.2* 17.7* 15.7* 16.4* 19.3*  HGB 8.5* 8.4* 8.7* 8.9* 9.5*  HCT 29.5* 29.7* 30.2* 30.8* 32.8*  MCV 103.5* 105.7* 104.9* 104.4* 104.5*  PLT 111* 103* 92* 91* 99*   Basic Metabolic Panel: Recent Labs  Lab 09/04/21 0331 09/05/21 0316 09/06/21 0348 09/07/21 0450 09/08/21 0309  NA 141 140 139 137 140  K 4.0 4.0 4.2 4.1 4.5  CL 102 100 100 98 100  CO2 _0 GLUCOSE 91 87 78 82 80  BUN 67* 62* 55* 47* 47*  CREATININE 1.43* 1.36* 1.15 1.01 1.04  CALCIUM 7.5* 7.2* 7.2* 7.0* 7.3*  MG  --   --   --  1.7  --   PHOS  --   --   --  2.5  --    Liver Function Tests: Recent Labs  Lab 09/02/21 0316 09/08/21 0309  AST 56* 113*  ALT 21 33  ALKPHOS 287* 364*  BILITOT 0.9 2.1*  PROT 4.2* 4.5*  ALBUMIN 2.0* 2.3*   CBG: No results for input(s): "GLUCAP" in the last 168 hours.  Discharge time spent: less than 30 minutes.  Signed: Marylu Lund, MD [facility] 09/08/2021

## 2021-09-08 NOTE — Progress Notes (Signed)
Nutrition Follow-up   INTERVENTION:   -Ensure Plus High Protein po BID, each supplement provides 350 kcal and 20 grams of protein.   -Multivitamin with minerals daily  NUTRITION DIAGNOSIS:   Increased nutrient needs related to cancer and cancer related treatments as evidenced by estimated needs.  Ongoing.  GOAL:   Patient will meet greater than or equal to 90% of their needs  Progressing.  MONITOR:   PO intake, Supplement acceptance, Labs, Weight trends, I & O's   ASSESSMENT:   88 y.o. male with medical history significant of mantle cell lymphoma, gallbladder CA, HTN. Presenting with BLE edema.  6/12: s/p paracentesis, yield 1.6L 6/14: s/p thoracentesis, yield 1.3L  Patient currently consuming 25-60% of meals. Pt not accepting Ensure at this time.  Per chart review, plan is for pt to return home with hospice.  Admission weight: 151 lbs Current weight: 170 lbs Per nursing documentation, pt with moderate BLE edema.   Medications: Senokot  Labs reviewed.  Diet Order:   Diet Order             Diet Heart Room service appropriate? Yes; Fluid consistency: Thin  Diet effective now                   EDUCATION NEEDS:   Not appropriate for education at this time  Skin:  Skin Assessment: Reviewed RN Assessment  Last BM:  6/7  Height:   Ht Readings from Last 1 Encounters:  09/01/21 '5\' 10"'$  (1.778 m)    Weight:   Wt Readings from Last 1 Encounters:  09/08/21 77.4 kg    BMI:  Body mass index is 24.48 kg/m.  Estimated Nutritional Needs:   Kcal:  2000-2200  Protein:  100-110g  Fluid:  2L/day   Clayton Bibles, MS, RD, LDN Inpatient Clinical Dietitian Contact information available via Amion

## 2021-09-08 NOTE — Progress Notes (Signed)
Occupational Therapy Treatment Patient Details Name: Mario Garcia MRN: [id number] DOB: [birth year] Today's Date: 09/08/2021   History of present illness patient is a 88 year old male who presented with BLE edema. patient was found to have Pleural effusions/pulmonary edema, doppler was negative for DVT, HTm, and AKI. PMH: stave IV mantle cell lymphoma, diffuse Large B cell stavge IV,gllbladder adenocarcinoma s/p cholecystectomy,   OT comments  Patient's wife concerned about taking him home and wanting him to be able to transfer. Therapist demonstrated log roll technique to transfer to edge of bed. Discussed use of hospital bed to assist with mobility. Patinet mod assist to transfer to [facility] - with therapist providing education to wife and verbal cues for hand placement for patinet. Patient lmited by hip pain and unable to maintain seated position on BSC therefore returned to supne for comfort. Educated patient's wife that transferring without +2 assistance would be unsafe at this time. Patient's spouse wanting to improve, at least attempt, patient's mobility and pain in preparation for home. Will continue to follow.   Recommendations for follow up therapy are one component of a multi-disciplinary discharge planning process, led by the attending physician.  Recommendations may be updated based on patient status, additional functional criteria and insurance authorization.    Follow Up Recommendations  No OT follow up    Assistance Recommended at Discharge Frequent or constant Supervision/Assistance  Patient can return home with the following  A lot of help with walking and/or transfers;Two people to help with walking and/or transfers;Assistance with cooking/housework;Assistance with feeding;Direct supervision/assist for medications management;Help with stairs or ramp for entrance;Assist for transportation;Direct supervision/assist for financial management   Equipment Recommendations  Other (comment)  (Defer to Hospice)    Recommendations for Other Services      Precautions / Restrictions Precautions Precautions: Fall Precaution Comments: monitor O2, left hip pain Restrictions Weight Bearing Restrictions: No       Mobility Bed Mobility                    Transfers                         Balance Overall balance assessment: Needs assistance Sitting-balance support: No upper extremity supported, Feet supported Sitting balance-Leahy Scale: Fair     Standing balance support: During functional activity Standing balance-Leahy Scale: Poor                             ADL either performed or assessed with clinical judgement   ADL Overall ADL's : Needs assistance/impaired                         Toilet Transfer: Moderate assistance;BSC/3in1 Toilet Transfer Details (indicate cue type and reason): Mod assist to pivot to [facility]. Unable to tolerate position due to hip pain therefore returned to supine in bed before he could have BM. Toileting- Clothing Manipulation and Hygiene: Total assistance Toileting - Clothing Manipulation Details (indicate cue type and reason): Total assist - needs assistance for hygiene, clothing management and has external catheter     Functional mobility during ADLs: Moderate assistance      Extremity/Trunk Assessment              Vision Patient Visual Report: No change from baseline     Perception     Praxis      Cognition Arousal/Alertness: Awake/alert Behavior  During Therapy: WFL for tasks assessed/performed Overall Cognitive Status: Within Functional Limits for tasks assessed                                          Exercises      Shoulder Instructions       General Comments      Pertinent Vitals/ Pain       Pain Assessment Pain Score: 8  Pain Location: L hip Pain Descriptors / Indicators: Aching, Grimacing, Restless, Moaning Pain Intervention(s): Monitored during  session, Patient requesting pain meds-RN notified  Home Living                                          Prior Functioning/Environment              Frequency  Min 1X/week        Progress Toward Goals  OT Goals(current goals can now be found in the care plan section)  Progress towards OT goals: Not progressing toward goals - comment (decreased abilities due to weakness and pain)  Acute Rehab OT Goals Patient Stated Goal: to transfer to chair and BSC OT Goal Formulation: With family Time For Goal Achievement: 09/17/21 Potential to Achieve Goals: [place] Discharge plan remains appropriate    Co-evaluation                 AM-PAC OT "6 Clicks" Daily Activity     Outcome Measure   Help from another person eating meals?: A Lot Help from another person taking care of personal grooming?: A Lot Help from another person toileting, which includes using toliet, bedpan, or urinal?: Total Help from another person bathing (including washing, rinsing, drying)?: A Lot Help from another person to put on and taking off regular upper body clothing?: A Lot Help from another person to put on and taking off regular lower body clothing?: Total 6 Click Score: 10    End of Session Equipment Utilized During Treatment: Oxygen  OT Visit Diagnosis: Unsteadiness on feet (R26.81);Muscle weakness (generalized) (M62.81)   Activity Tolerance Patient limited by pain   Patient Left in bed;with call bell/phone within reach;with family/visitor present   Nurse Communication Mobility status;Patient requests pain meds        Time: 9629-5284 OT Time Calculation (min): 26 min  Charges: OT General Charges $OT Visit: 1 Visit OT Treatments $Self Care/Home Management : 8-22 mins $Therapeutic Activity: 8-22 mins  Derl Barrow, OTR/L [place]  Office [phone] Pager: [place] 09/08/2021, 11:07 AM

## 2021-09-08 NOTE — Progress Notes (Signed)
Patient will be discharging home via [facility] this afternoon. Belongings sent home with wife.

## 2021-09-09 ENCOUNTER — Encounter (HOSPITAL_COMMUNITY): Admission: RE | Admit: 2021-09-09 | Payer: Medicare Other | Source: Ambulatory Visit

## 2021-09-09 ENCOUNTER — Telehealth: Payer: Self-pay

## 2021-09-09 DIAGNOSIS — R4020 Unspecified coma: Secondary | ICD-10-CM | POA: Diagnosis not present

## 2021-09-09 DIAGNOSIS — I499 Cardiac arrhythmia, unspecified: Secondary | ICD-10-CM | POA: Diagnosis not present

## 2021-09-09 LAB — MISC LABCORP TEST (SEND OUT): Labcorp test code: 9985

## 2021-09-12 LAB — CULTURE, BODY FLUID W GRAM STAIN -BOTTLE: Culture: NO GROWTH

## 2021-09-12 LAB — CYTOLOGY - NON PAP

## 2021-09-18 LAB — SUSCEPTIBILITY RESULT

## 2021-09-18 LAB — SUSCEPTIBILITY, AER + ANAEROB

## 2021-09-19 LAB — CULTURE, BODY FLUID W GRAM STAIN -BOTTLE

## 2021-09-24 DIAGNOSIS — 419620001 Death: Secondary | SNOMED CT | POA: Diagnosis not present

## 2021-09-24 NOTE — Telephone Encounter (Signed)
Pt. Was recently in the hospital with bilateral edema, AKI, and Thrombocytopenia. Pt. Has been released into hospice care.

## 2021-09-24 DEATH — deceased

## 2021-09-30 ENCOUNTER — Other Ambulatory Visit: Payer: Self-pay | Admitting: Hematology

## 2021-10-07 ENCOUNTER — Other Ambulatory Visit: Payer: Medicare Other

## 2021-10-07 ENCOUNTER — Ambulatory Visit: Payer: Medicare Other | Admitting: Hematology

## 2021-10-13 NOTE — Addendum Note (Signed)
Encounter addended by: Wolfgang Phoenix on: 10/13/2021 3:05 PM  Actions taken: Imaging Exam ended

## 2021-10-26 ENCOUNTER — Other Ambulatory Visit (HOSPITAL_COMMUNITY): Payer: Self-pay | Admitting: Interventional Radiology

## 2021-10-26 DIAGNOSIS — C799 Secondary malignant neoplasm of unspecified site: Secondary | ICD-10-CM

## 2021-10-26 DIAGNOSIS — C249 Malignant neoplasm of biliary tract, unspecified: Secondary | ICD-10-CM

## 2021-10-26 NOTE — Addendum Note (Signed)
Encounter addended by: Wolfgang Phoenix on: 10/26/2021 4:16 PM  Actions taken: Imaging Exam ended

## 2021-10-26 NOTE — Addendum Note (Signed)
Encounter addended by: Wolfgang Phoenix on: 10/26/2021 4:17 PM  Actions taken: Imaging Exam ended

## 2022-03-10 ENCOUNTER — Ambulatory Visit: Payer: Medicare Other | Admitting: Family Medicine

## 2023-01-10 NOTE — Telephone Encounter (Signed)
TC

## 2023-02-14 NOTE — Telephone Encounter (Signed)
Telephone call  

## 2023-06-06 IMAGING — XA IR EMBO TUMOR ORGAN ISCHEMIA INFARCT INC GUIDE ROADMAPPING
2 series · 13 of 17 positions shown · non-contrast
Comparison: none

CLINICAL DATA: Metastatic gallbladder carcinoma to the liver and
presentation for yttrium-39 radioembolization of the right lobe of
the liver.

[Series 1: processed: ir embo tumor organ ischemia  · 4 acquisitions, 11 frames shown]
[im 1/4]
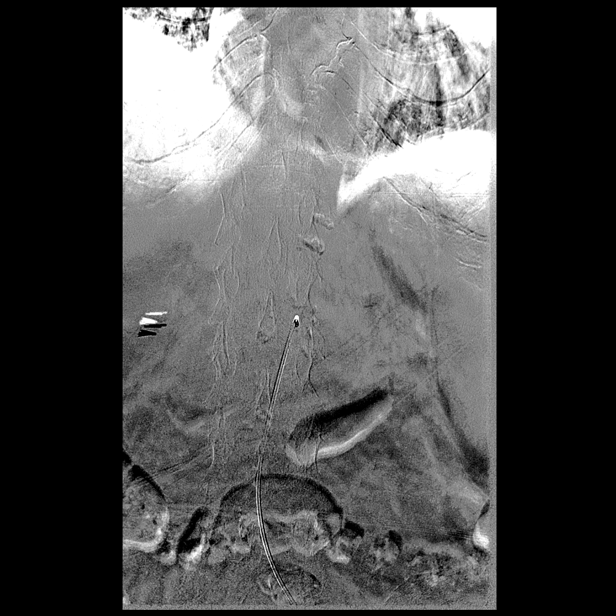
[im 1/4]
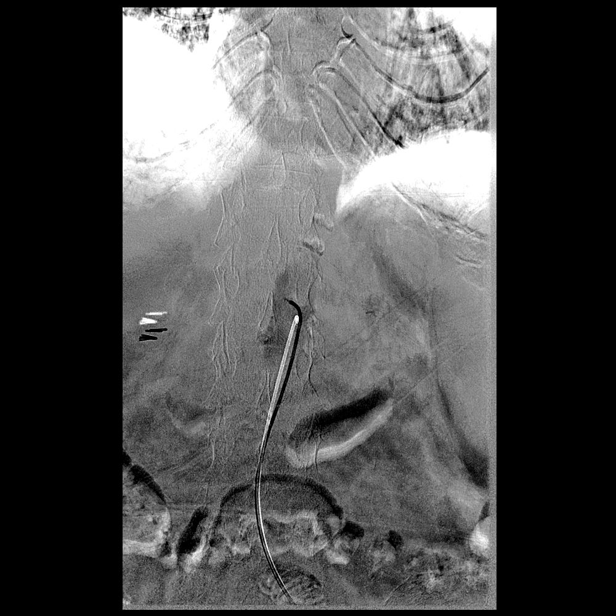
[im 1/4]
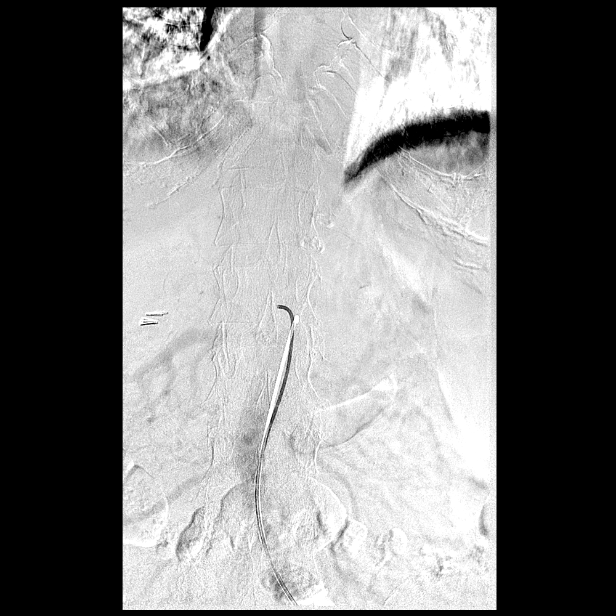
[im 2/4]
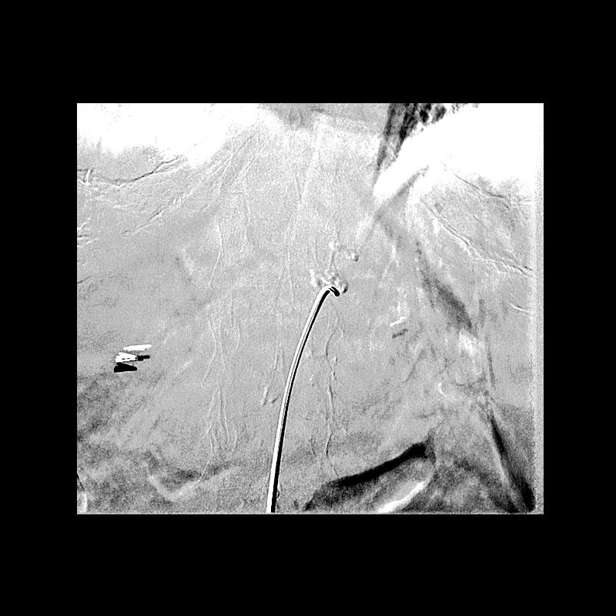
[im 2/4]
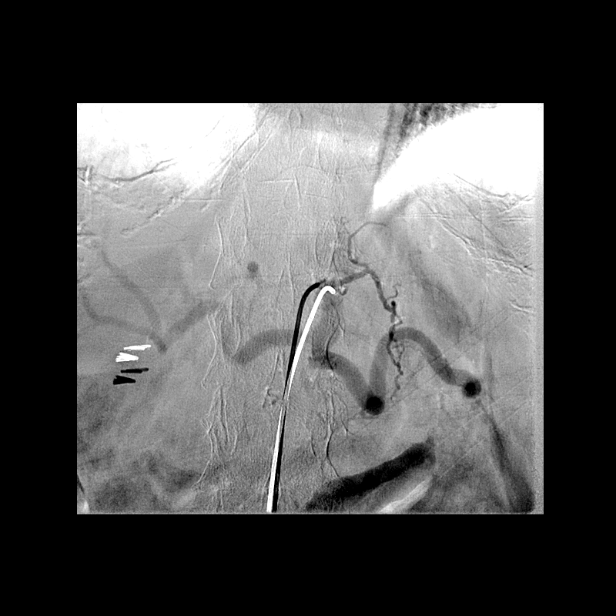
[im 3/4]
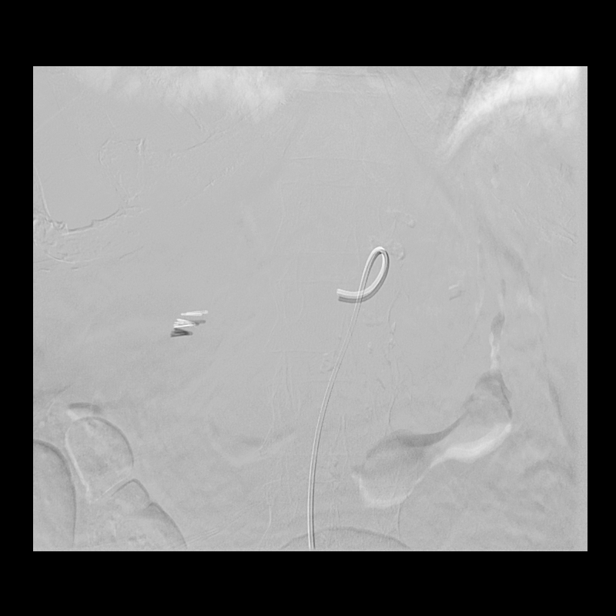
[im 3/4]
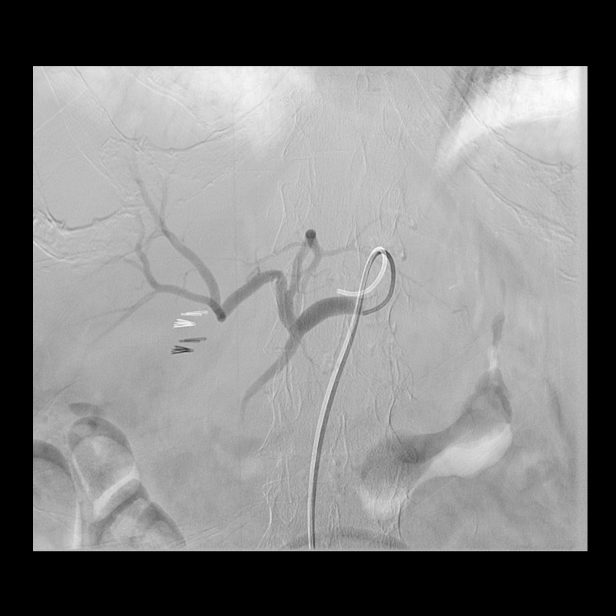
[im 3/4]
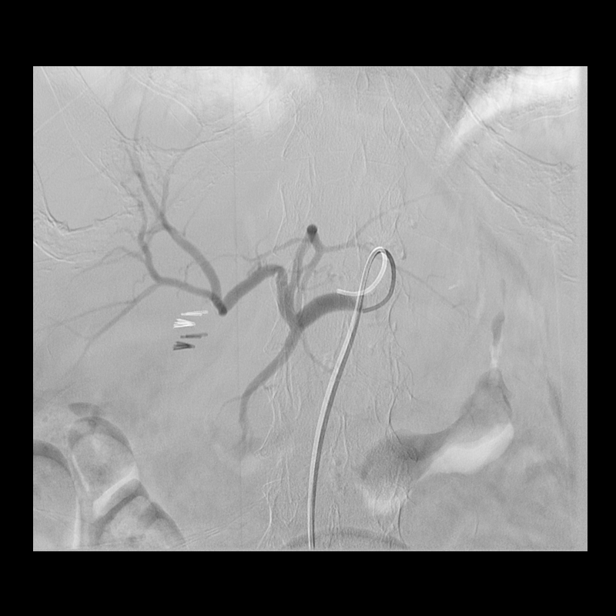
[im 4/4]
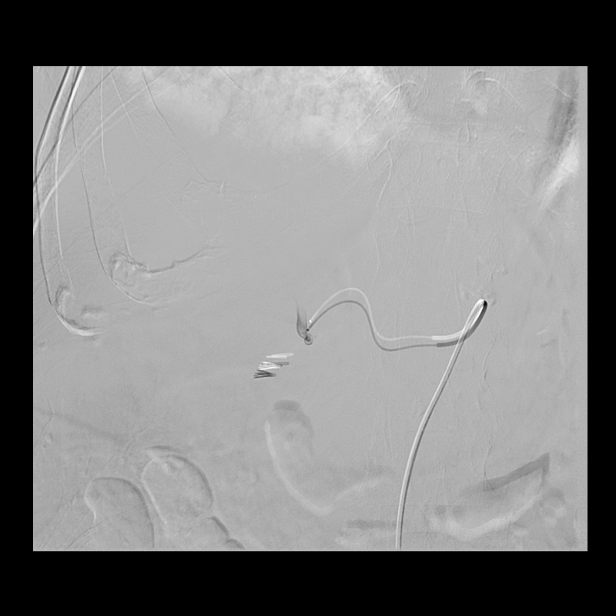
[im 4/4]
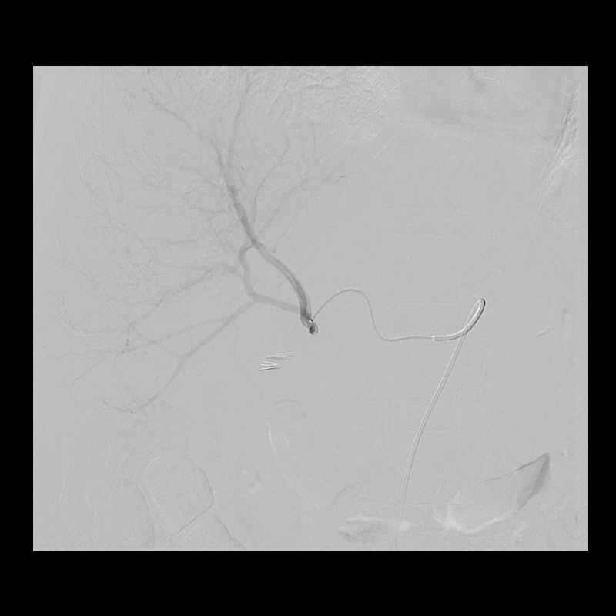
[im 4/4]
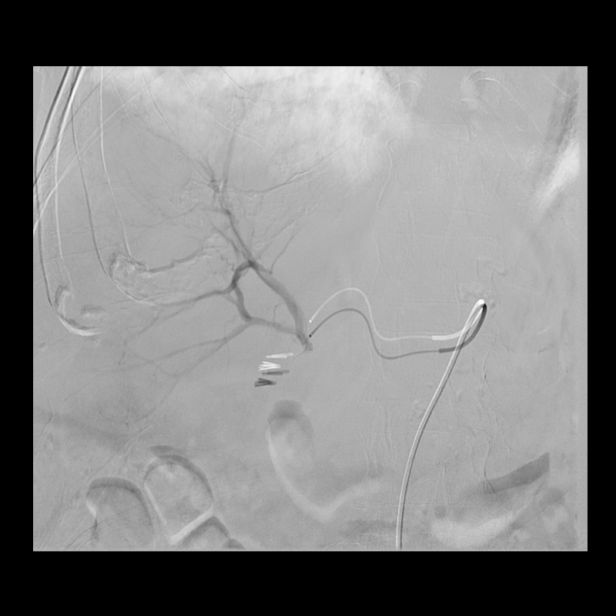

[Series 1: ir (id) (id)/(id)/(id) · 2 of 2 slices shown]
[im 1/2]
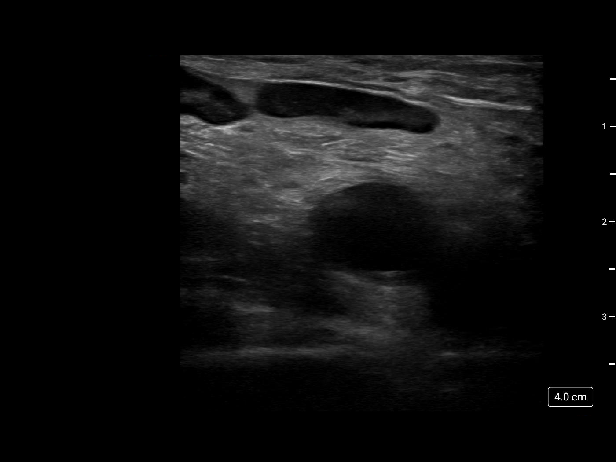
[im 2/2]
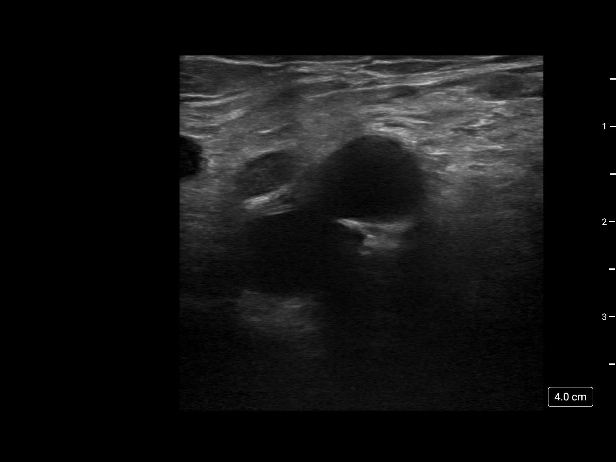

[13 of 17 positions shown; findings below may reference images not displayed]

EXAM:
1. ULTRASOUND GUIDANCE FOR VASCULAR ACCESS OF THE RIGHT COMMON
FEMORAL ARTERY
2. VISCERAL SELECTIVE ARTERIOGRAPHY OF THE CELIAC AXIS
3. SELECTIVE ARTERIOGRAPHY AT THE LEVEL OF THE COMMON HEPATIC ARTERY
4. SELECTIVE ARTERIOGRAPHY AT THE LEVEL OF THE RIGHT HEPATIC ARTERY
5. TRANSCATHETER [id number] RADIOEMBOLIZATION OF THE RIGHT HEPATIC
ARTERY

FLUOROSCOPY:
12 minutes and 36 seconds.  174 mGy.

MEDICATIONS AND MEDICAL HISTORY:
Additional Medications: 500 mg IV Flagyl, 500 mg IV Levaquin, 8 mg
IV Decadron, 40 mg IV Protonix, 8 mg IV Zofran

Y-90 dose: 27 mCi

ANESTHESIA/SEDATION:
Moderate (conscious) sedation was employed during this procedure. A
total of Versed 2.0 mg and Fentanyl 100 mcg was administered
intravenously by radiology nursing.

Moderate Sedation Time: 51 minutes. The patient's level of
consciousness and vital signs were monitored continuously by
radiology nursing throughout the procedure under my direct
supervision.

CONTRAST:  35 mL Lmnipaque-4MM

PROCEDURE:
The procedure, risks, benefits, and alternatives were explained to
the patient. Questions regarding the procedure were encouraged and
answered. The patient understands and consents to the procedure. A
time-out was performed prior to initiating the procedure.

The left groin was prepped with chlorhexidine in a sterile fashion,
and a sterile drape was applied covering the operative field. A
sterile gown and sterile gloves were used for the procedure. Local
anesthesia was provided with 1% Lidocaine. Ultrasound image
documentation was performed.

Ultrasound was performed of both right and left common femoral
arteries. The left was chosen for access. A permanent ultrasound
image was recorded to document patency and level of access. Access
of the left common femoral artery was performed under ultrasound
guidance with a micropuncture [DATE]-French sheath was placed. A
5-French Cobra catheter was advanced and used to selectively
catheterize the celiac axis. Selective arteriography of the celiac
axis was performed.

The 5 French catheter was further advanced over a hydrophilic
guidewire into the common hepatic artery and selective arteriography
performed at the level of the common hepatic artery. A microcatheter
was used to selectively catheterize the right hepatic artery.
Selective right hepatic arteriography was performed.

Radioembolization was performed with Yttrium-LU SIR Spheres.
Particles were administered via a microcatheter utilizing a
completely enclosed system. Monitoring of antegrade flow was
performed during administration under fluoroscopy with use of
contrast intermittently. After administration of the first dose of
particles, the microcatheter was removed and discarded along with
the attached tubing and particle vial.

Arteriotomy closure was performed with the Cordis ExoSeal device.
FINDINGS: Access for treatment was performed at the level of the left common
femoral artery due to small residual hematoma overlying the right
common femoral artery as well as ecchymosis from the prior
arteriogram in the right groin region. Arteriography was necessary
prior to radioembolization to confirm patency of the celiac axis,
branch vessels and right hepatic artery as well as plan exact level
of treatment. There was no significant change in flow pattern
compared to the prior mapping study performed on 07/28/2021. There
is some antegrade flow in the gastroduodenal artery with common
hepatic arteriography. Previous arteriography demonstrated
predominantly retrograde flow in the GDA trunk. GDA flow may need to
be reassessed and occluded prior to potential future left hepatic
artery radioembolization.

The entire dose of yttrium 90 microspheres was able to be delivered
in the right hepatic artery while maintaining antegrade flow.

COMPLICATIONS:
None
IMPRESSION: Hepatic arterial radioembolization performed with Yttrium-LU
microspheres. Right hepatic artery treatment was performed today
with administration of 27 millicuries of Y-90 activity.

## 2023-06-06 IMAGING — NM NM LIVER IMG SPECT
1 series · 6 of 6 positions shown · non-contrast
Comparison: MAA scan 07/28/2021, MRI 05/25/2021

CLINICAL DATA: Gallbladder carcinoma with liver metastasis.
Microsphere radio therapy to the RIGHT hepatic lobe.

EXAM:
NUCLEAR MEDICINE SPECIAL MED RAD PHYSICS CONS; NUCLEAR MEDICINE
RADIO PHARM THERAPY INTRA ARTERIAL; NUCLEAR MEDICINE TREATMENT
PROCEDURE; NUCLEAR MEDICINE LIVER SCAN
TECHNIQUE: In conjunction with the interventional radiologist a Y- Microsphere
dose was calculated utilizing body surface area formulation as well
as partition formulation. Post processing volume metrics performed.
Calculated dose equal 28.39 mCi. Pre therapy MAA liver SPECT scan
and CTA were evaluated. Utilizing a microcatheter system, the
hepatic artery was selected and Y-90 microspheres were delivered in
fractionated aliquots. Radiopharmaceutical was delivered by the
interventional radiologist and nuclear radiologist.
The patient tolerated procedure well. No adverse effects were noted.
Bremsstrahlung planar and SPECT imaging of the abdomen following
intrahepatic arterial delivery of Y-90 microsphere was performed.
RADIOPHARMACEUTICALS:  27.8 millicuries yttrium 90 microspheres

[Series 2: y-90 spect · 4.14mm/px · 6 of 64 frames shown]
[frame 6/64]
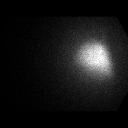
[frame 16/64]
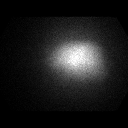
[frame 27/64]
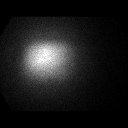
[frame 38/64]
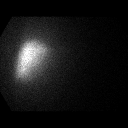
[frame 48/64]
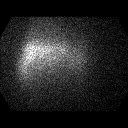
[frame 59/64]
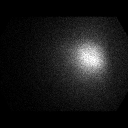

[6 of 6 positions shown; findings below may reference images not displayed]

FINDINGS: [AGE] microspheres therapy as above. First therapy the right
hepatic lobe.

Bremsstrahlung planar and SPECT imaging of the abdomen following
intrahepatic arterial delivery of W-9Ymicrosphere demonstrates
radioactivity localized to the RIGHT hepatic lobe. No evidence of
extrahepatic activity.
IMPRESSION: Successful [AGE] microsphere delivery for treatment of unresectable
liver metastasis. First therapy to the RIGHT lobe.

Bremssstrahlung scan demonstrates activity localized to RIGHT
hepatic lobe with no extrahepatic activity identified.
